# Patient Record
Sex: Female | Born: 1935 | Race: White | Hispanic: No | Smoking: Former smoker
Health system: Southern US, Community
[De-identification: ages and names within clinical notes are randomized; demographics above are authoritative.]

## PROBLEM LIST (undated history)

## (undated) DIAGNOSIS — I4891 Unspecified atrial fibrillation: Secondary | ICD-10-CM

## (undated) DIAGNOSIS — I10 Essential (primary) hypertension: Secondary | ICD-10-CM

## (undated) DIAGNOSIS — E039 Hypothyroidism, unspecified: Secondary | ICD-10-CM

## (undated) DIAGNOSIS — I340 Nonrheumatic mitral (valve) insufficiency: Secondary | ICD-10-CM

## (undated) DIAGNOSIS — E785 Hyperlipidemia, unspecified: Secondary | ICD-10-CM

## (undated) DIAGNOSIS — I341 Nonrheumatic mitral (valve) prolapse: Secondary | ICD-10-CM

## (undated) HISTORY — DX: Unspecified atrial fibrillation: I48.91

## (undated) HISTORY — DX: Essential (primary) hypertension: I10

## (undated) HISTORY — PX: OTHER SURGICAL HISTORY: SHX169

## (undated) HISTORY — PX: APPENDECTOMY: SHX54

## (undated) HISTORY — DX: Nonrheumatic mitral (valve) prolapse: I34.1

## (undated) HISTORY — PX: CHOLECYSTECTOMY: SHX55

## (undated) HISTORY — DX: Nonrheumatic mitral (valve) insufficiency: I34.0

## (undated) HISTORY — DX: Hyperlipidemia, unspecified: E78.5

---

## 2017-07-09 LAB — HEMOGLOBIN A1C: Hemoglobin A1C: 5.1

## 2018-04-09 LAB — BASIC METABOLIC PANEL
BUN: 10 (ref 4–21)
GLUCOSE: 89
Potassium: 4.2 (ref 3.4–5.3)
Sodium: 136 — AB (ref 137–147)

## 2018-04-09 LAB — CBC AND DIFFERENTIAL
HCT: 40 (ref 36–46)
Hemoglobin: 13.2 (ref 12.0–16.0)
Platelets: 130 — AB (ref 150–399)

## 2018-04-09 LAB — TSH: TSH: 2.79 (ref <=5.90)

## 2018-04-21 ENCOUNTER — Encounter: Payer: Self-pay | Admitting: Family Medicine

## 2018-05-13 ENCOUNTER — Ambulatory Visit (INDEPENDENT_AMBULATORY_CARE_PROVIDER_SITE_OTHER): Payer: Medicare Other

## 2018-05-13 ENCOUNTER — Encounter (HOSPITAL_COMMUNITY): Payer: Self-pay | Admitting: Emergency Medicine

## 2018-05-13 ENCOUNTER — Encounter: Payer: Self-pay | Admitting: Family Medicine

## 2018-05-13 ENCOUNTER — Ambulatory Visit (HOSPITAL_COMMUNITY)
Admission: EM | Admit: 2018-05-13 | Discharge: 2018-05-13 | Disposition: A | Discharge status: Home / Self Care | Payer: Medicare Other | Attending: Family Medicine | Admitting: Family Medicine

## 2018-05-13 DIAGNOSIS — W19XXXA Unspecified fall, initial encounter: Secondary | ICD-10-CM | POA: Diagnosis not present

## 2018-05-13 DIAGNOSIS — S8001XA Contusion of right knee, initial encounter: Secondary | ICD-10-CM

## 2018-05-13 MED ORDER — ACETAMINOPHEN 500 MG PO TABS: 1000.0000 mg | ORAL_TABLET | Freq: Three times a day (TID) | ORAL | 0 refills | Status: DC | PRN

## 2018-05-13 NOTE — Discharge Instructions (Addendum)
Your xray is normal today without any sign of broken bone. You clearly have bruised your knee from this fall.  Ice application, ace wrap for compression can help with pain and swelling.  Tylenol as needed Activity as tolerated. If any worsening or no improvement in the next 2 weeks please follow up with orthopedics.   Dg Knee Complete 4 Views Right  Result Date: 05/13/2018 CLINICAL DATA:  Tripped over a metal bar and fell striking the right knee anteriorly. The patient is experiencing anterior knee pain extending medially and laterally. EXAM: RIGHT KNEE - COMPLETE 4+ VIEW COMPARISON:  None in PACs FINDINGS: The bones are subjectively osteopenic. There is no acute fracture nor dislocation. There is no joint effusion. The prepatellar soft tissues appear normal. There is mild narrowing of the medial joint compartment with minimal chondrocalcinosis noted in the medial meniscus. There is also lateral meniscal chondrocalcinosis. There is mild beaking of the tibial spines. IMPRESSION: No acute fracture or dislocation of the right knee. Mild osteoarthritic changes centered on the medial compartment. Electronically Signed   By: David  SwazilandJordan M.D.   On: 05/13/2018 12:21

## 2018-05-13 NOTE — ED Notes (Signed)
traci bast, np handed ace wrap to patient.  Patient reports already having papers

## 2018-05-13 NOTE — ED Triage Notes (Signed)
Pt states she tripped over a metal bar and fell and landed on her R knee. Denies hitting head or LOC. Large bruising noted to R knee, pt is on blood thinners.

## 2018-05-13 NOTE — ED Provider Notes (Signed)
[facility]    CSN: [id number] Arrival date & time: 05/13/18  1058     History   Chief Complaint Chief Complaint  Patient presents with  . Knee Injury  . Fall    HPI Grace Alvarado is a 84 y.o. female.   Grace Alvarado presents with family with complaints of right knee pain after she fell today prior to arrival. [facility] and there was a pole near the carts which she struck causing her to fall on her knee. Pain to right knee. No previous injury or surgery to the knee. No new numbness or tingling. She has been ambulatory but pain with weight bearing. Pain is moderate in severity. Did not hit head, did not lose consciousness. Denies ankle pain, left knee pain, or wrist pain. She is on xarelto for afib.     ROS per HPI.      Past Medical History:  Diagnosis Date  . A-fib (HCC)     There are no active problems to display for this patient.   Past Surgical History:  Procedure Laterality Date  . APPENDECTOMY    . c section    . CHOLECYSTECTOMY      OB History   None      Home Medications    Prior to Admission medications   Medication Sig Start Date End Date Taking? Authorizing Provider  Cyanocobalamin (B-12 COMPLIANCE INJECTION IJ) Inject as directed.   Yes [provider]  diltiazem (DILACOR XR) 120 MG 24 hr capsule Take 120 mg by mouth daily.   Yes [provider]  levothyroxine (SYNTHROID, LEVOTHROID) 75 MCG tablet Take 75 mcg by mouth daily before breakfast.   Yes [provider]  pravastatin (PRAVACHOL) 10 MG tablet Take 10 mg by mouth daily.   Yes [provider]  rivaroxaban (XARELTO) 20 MG TABS tablet Take 20 mg by mouth daily with supper.   Yes [provider]  valsartan (DIOVAN) 40 MG tablet Take 40 mg by mouth daily.   Yes [provider]  acetaminophen (TYLENOL) 500 MG tablet Take 2 tablets (1,000 mg total) by mouth every 8 (eight) hours as needed for moderate pain. 05/13/18   Georgetta HaberBurky,  Skye Rodarte B, NP    Family History No family history on file.  Social History Social History   Tobacco Use  . Smoking status: Former Smoker  Substance Use Topics  . Alcohol use: Not Currently  . Drug use: Never     Allergies   Codeine; Latex; Penicillins; Vancomycin; and Zithromax [azithromycin]   Review of Systems Review of Systems   Physical Exam Triage Vital Signs ED Triage Vitals [05/13/18 1120]  Enc Vitals Group     BP (!) 141/80     Pulse Rate (!) 107     Resp 18     Temp 98.9 F (37.2 C)     Temp src      SpO2 97 %     Weight      Height      Head Circumference      Peak Flow      Pain Score      Pain Loc      Pain Edu?      Excl. in GC?    No data found.  Updated Vital Signs BP (!) 141/80   Pulse (!) 107   Temp 98.9 F (37.2 C)   Resp 18   SpO2 97%    Physical Exam  Constitutional: She  is oriented to person, place, and time. She appears well-developed and well-nourished. No distress.  Cardiovascular: Normal rate and normal heart sounds. An irregularly irregular rhythm present.  Pulmonary/Chest: Effort normal and breath sounds normal.  Musculoskeletal:       Right knee: She exhibits swelling, ecchymosis, erythema and bony tenderness. She exhibits normal range of motion, no effusion, no deformity, no laceration, normal alignment, no LCL laxity, normal patellar mobility, normal meniscus and no MCL laxity. Tenderness found. Lateral joint line tenderness noted.       Right ankle: Normal.  Left knee with old bruising, non tender; right with bruising and likely hematoma present with tenderness to proximal tibial tuberosity and to patella as well as to lateral knee soft tissue; no pain with ROM, no laxity noted; ambulatory without difficulty   Neurological: She is alert and oriented to person, place, and time.  Skin: Skin is warm and dry.     UC Treatments / Results  Labs (all labs ordered are listed, but only abnormal results are displayed) Labs  Reviewed - No data to display  EKG None  Radiology Dg Knee Complete 4 Views Right  Result Date: 05/13/2018 CLINICAL DATA:  Tripped over a metal bar and fell striking the right knee anteriorly. The patient is experiencing anterior knee pain extending medially and laterally. EXAM: RIGHT KNEE - COMPLETE 4+ VIEW COMPARISON:  None in PACs FINDINGS: The bones are subjectively osteopenic. There is no acute fracture nor dislocation. There is no joint effusion. The prepatellar soft tissues appear normal. There is mild narrowing of the medial joint compartment with minimal chondrocalcinosis noted in the medial meniscus. There is also lateral meniscal chondrocalcinosis. There is mild beaking of the tibial spines. IMPRESSION: No acute fracture or dislocation of the right knee. Mild osteoarthritic changes centered on the medial compartment. Electronically Signed   By: David  [place] M.D.   On: 05/13/2018 12:21    Procedures Procedures (including critical care time)  Medications Ordered in UC Medications - No data to display  Initial Impression / Assessment and Plan / UC Course  I have reviewed the triage vital signs and the nursing notes.  Pertinent labs & imaging results that were available during my care of the patient were reviewed by me and considered in my medical decision making (see chart for details).     Xray without acute findings, consistent with exam. Contusion present, is on blood thinners and had a fall. Ice, elevation, tylenol, ace wrap for pain control. Follow up with orthopedics as needed for any persistent symptoms. Patient and families verbalized understanding and agreeable to plan.   Final Clinical Impressions(s) / UC Diagnoses   Final diagnoses:  Fall, initial encounter  Contusion of right knee, initial encounter     Discharge Instructions     Your xray is normal today without any sign of broken bone. You clearly have bruised your knee from this fall.  Ice application, ace  wrap for compression can help with pain and swelling.  Tylenol as needed Activity as tolerated. If any worsening or no improvement in the next 2 weeks please follow up with orthopedics.   Dg Knee Complete 4 Views Right  Result Date: 05/13/2018 CLINICAL DATA:  Tripped over a metal bar and fell striking the right knee anteriorly. The patient is experiencing anterior knee pain extending medially and laterally. EXAM: RIGHT KNEE - COMPLETE 4+ VIEW COMPARISON:  None in PACs FINDINGS: The bones are subjectively osteopenic. There is no acute fracture nor dislocation. There  is no joint effusion. The prepatellar soft tissues appear normal. There is mild narrowing of the medial joint compartment with minimal chondrocalcinosis noted in the medial meniscus. There is also lateral meniscal chondrocalcinosis. There is mild beaking of the tibial spines. IMPRESSION: No acute fracture or dislocation of the right knee. Mild osteoarthritic changes centered on the medial compartment. Electronically Signed   By: David  [place] M.D.   On: 05/13/2018 12:21     ED Prescriptions    Medication Sig Dispense Auth. Provider   acetaminophen (TYLENOL) 500 MG tablet Take 2 tablets (1,000 mg total) by mouth every 8 (eight) hours as needed for moderate pain. 30 tablet Georgetta Haber, NP     Controlled Substance Prescriptions [place] Controlled Substance Registry consulted? Not Applicable   Georgetta Haber, NP 05/13/18 1229

## 2018-05-14 NOTE — Telephone Encounter (Signed)
Spoke to pt, told her I was calling in response to her My Chart message about her knee pain and burning. Told pt I saw she was seen at urgent care and was given instructions and has an appt with Dr. Artis FlockWolfe on 8/12.  Pt said yes I want you to speak to my daughter it is complicated. Asked pt are you giving me permission to speak to your daughter Angelique BlonderDenise. Pt said yes. Spoke to pt's daughter Angelique BlonderDenise. She explained pt was seen at urgent care and is having burning in right knee. Told pt according to note from urgent care right knee is bruised. Asked her if she has a brush burn on knee? Angelique BlonderDenise said yes and she applied ointment to it.  Told her that may be why she has burning due to brush burn. Asked her if she is icing her right knee if so not putting ice directly on knee. Angelique BlonderDenise said she is icing but has it wrapped. Told her okay. Asked her if she is having increase in pain or swelling. Angelique BlonderDenise said no swelling is better. Went over instructions again from Urgent care and told her we could see her today if she would like with another provider due to Dr. Artis FlockWolfe is out of the office. Angelique BlonderDenise said no, declined to be seen today. Pt will rest, continue ice and tylenol on right knee and wait till appointment on Monday. Told her okay if she has any increase in pain or swelling please go to ED or Urgent care otherwise we will see her on Monday. Densie verbalized understanding.

## 2018-05-17 ENCOUNTER — Encounter: Payer: Self-pay | Admitting: Family Medicine

## 2018-05-17 ENCOUNTER — Ambulatory Visit: Payer: Medicare Other | Admitting: Family Medicine

## 2018-05-17 VITALS — BP 150/94 | HR 95 | Temp 98.0°F | Ht 63.0 in | Wt 149.4 lb

## 2018-05-17 DIAGNOSIS — E038 Other specified hypothyroidism: Secondary | ICD-10-CM | POA: Diagnosis not present

## 2018-05-17 DIAGNOSIS — L858 Other specified epidermal thickening: Secondary | ICD-10-CM

## 2018-05-17 DIAGNOSIS — S8011XA Contusion of right lower leg, initial encounter: Secondary | ICD-10-CM

## 2018-05-17 DIAGNOSIS — I482 Chronic atrial fibrillation, unspecified: Secondary | ICD-10-CM

## 2018-05-17 DIAGNOSIS — I2510 Atherosclerotic heart disease of native coronary artery without angina pectoris: Secondary | ICD-10-CM | POA: Insufficient documentation

## 2018-05-17 DIAGNOSIS — E782 Mixed hyperlipidemia: Secondary | ICD-10-CM

## 2018-05-17 DIAGNOSIS — I10 Essential (primary) hypertension: Secondary | ICD-10-CM

## 2018-05-17 DIAGNOSIS — I340 Nonrheumatic mitral (valve) insufficiency: Secondary | ICD-10-CM | POA: Insufficient documentation

## 2018-05-17 DIAGNOSIS — E039 Hypothyroidism, unspecified: Secondary | ICD-10-CM | POA: Insufficient documentation

## 2018-05-17 DIAGNOSIS — I6523 Occlusion and stenosis of bilateral carotid arteries: Secondary | ICD-10-CM | POA: Diagnosis not present

## 2018-05-17 DIAGNOSIS — I071 Rheumatic tricuspid insufficiency: Secondary | ICD-10-CM | POA: Insufficient documentation

## 2018-05-17 MED ORDER — PRAVASTATIN SODIUM 10 MG PO TABS: 10.0000 mg | ORAL_TABLET | Freq: Every day | ORAL | 3 refills | Status: DC

## 2018-05-17 MED ORDER — DILTIAZEM HCL ER 120 MG PO CP12: 120.0000 mg | ORAL_CAPSULE | Freq: Two times a day (BID) | ORAL | 1 refills | Status: DC

## 2018-05-17 MED ORDER — DICLOFENAC SODIUM 1 % TD GEL: 4.0000 g | Freq: Four times a day (QID) | TRANSDERMAL | 0 refills | Status: DC

## 2018-05-17 MED ORDER — LEVOTHYROXINE SODIUM 75 MCG PO TABS: 75.0000 ug | ORAL_TABLET | Freq: Every day | ORAL | 3 refills | Status: DC

## 2018-05-17 NOTE — Progress Notes (Signed)
Patient: Grace Alvarado MRN: [id number] DOB: December 13, 1924 PCP: System, Pcp Not In     Subjective:  Chief Complaint  Patient presents with  . Establish Care  . Atrial Fibrillation  . Hypertension  . Hyperlipidemia    HPI: The patient is a 84 y.o. female who presents today for HTN, atrial fibrillation, check right leg s/p fall on 8/8.   Afib: currently on xarelto and diltiazem. No other rhythmic drug. Just moved from [place] and is needing a new cardiologist. Briefly reviewed cardiology records and she has failed amio and multaq. Has been cardioverted multiple times. Met with electrophysiologist in June 2019 and discussed ablation, but patient does not want to do this and just do rate control.   HTN: currently on diovan prn and diltiazem. Cbc,cmp and urine just recently done 04/2018 and all to goal. No protein in urine, GFR excellent and cbc essentially normal except for platelets of 130. She has rarely, if ever had to use diovan. It's apparently written with parameters as a PRN drug.   Hyperlipidemia: on pravachol. Labs recently checked and her cholesterol is extremely well controlled and to goal. Total cholesterol: 133, TG: 43, LDL: 56 and HDL: 68. Records indicate CAD and hx of mild carotid stenosis. Last carotid ultrasound I could find was from 2012.   Hypothyroid: currently on synthroid 30mg daily. Just had labs done in July 2019 and TSH/t4 were to goal. Labs scanned into chart.   Right leg pain: She fell onto her right knee on Thursday. Seen in ED with negative xrays. She is on xarelto and has a lot of bruising/hematoma. Pain is a 5-10/10. She is able to walk, but states it burns and is painful. Has gotten slightly better.   Review of Systems  Constitutional: Negative for appetite change and fatigue.  Respiratory: Negative for chest tightness and shortness of breath.   Cardiovascular: Negative for chest pain, palpitations and leg swelling.  Gastrointestinal: Negative for abdominal  pain and nausea.  Musculoskeletal: Positive for back pain and neck pain.  Skin: Positive for color change.       S/p fall on 8/8 lg hematoma on right lower leg  Neurological: Negative for dizziness and headaches.  Psychiatric/Behavioral: The patient is nervous/anxious.     Allergies Patient is allergic to codeine; latex; penicillins; vancomycin; and zithromax [azithromycin].  Past Medical History Patient  has a past medical history of A-fib ([facility], [place], Hypertension, and Thyroid disease.  Surgical History Patient  has a past surgical history that includes Appendectomy; Cholecystectomy; c section; and Cesarean section.  Family History Pateint's Family history is unknown by patient.  Social History Patient  reports that she has quit smoking. She has never used smokeless tobacco. She reports that she drank alcohol. She reports that she does not use drugs.    Objective: Vitals:   05/17/18 1053  BP: (!) 150/94  Pulse: 95  Temp: 98 F (36.7 C)  TempSrc: Oral  SpO2: 98%  Weight: 149 lb 6.4 oz (67.8 kg)  Height: '5\' 3"'  (1.6 m)    Body mass index is 26.47 kg/m.  Physical Exam  Constitutional: She appears well-developed and well-nourished.  HENT:  Right Ear: External ear normal.  Left Ear: External ear normal.  Mouth/Throat: Oropharynx is clear and moist.  Eyes: Pupils are equal, round, and reactive to light. EOM are normal.  Neck: Normal range of motion. Neck supple. No thyromegaly present.  Cardiovascular:  Murmur heard. Rate controlled afib   Pulmonary/Chest: Effort normal and breath  sounds normal.  Abdominal: Soft. Bowel sounds are normal.  Skin:  Large hematoma on right patella. TTP. ecchymosis all down right anterior leg.   Small, crusty, pointed growth coming off left finger, middle MCP.   Psychiatric: She has a normal mood and affect. Her behavior is normal.  Vitals reviewed.      Assessment/plan: 1. Essential hypertension Above goal  today. Family states that she is usually well controlled around 120/80. She is in quite a bit of pain so likely not helping. We are going to continue medication. Discussed the recall on valsartan, but it sounds like she has never needed to take this drug since written with prn parameters. Not sure why this drug was chosen for this and I would not use it. They will be seeing cardiology so advised they talk to him regarding using this drug prn and continuing this since recalled. Spent over 25 minutes reviewing records with family.   2. Chronic a-fib (HCC) No desire for ablation, just would like to be rate controlled. Continue current meds and referral placed to preferred cardiologist. Refills given.  - Ambulatory referral to Cardiology  3. Other specified hypothyroidism To goal. Continue current dosing. Labs just done. Refills given.   4. Carotid atherosclerosis, bilateral Last ultrasound done was in 2012. Would repeat and will ask cardiologist when they go. Already on statin.   5. Keratoacanthoma Needs removed to make sure no scc. Referral to derm done.  - Ambulatory referral to Dermatology  6. Leg hematoma, right, initial encounter Discussed this will take some time to reabsorp. Is already feeling a little better. Can continue ice, leg elevation. Cellulitis precautions given. If not getting better after 2+ weeks they are to let me know. She may need Mri to rule out meniscus injury or ligament injury.     7. Mixed hyperlipidemia On statin. Cholesterol to goal. Refills given.   Records requested and will review rest of her cards records. Can return for AWV and see me in 6 months.    Return in about 6 months (around 11/17/2018).     Orma Flaming, MD [place]   05/17/2018

## 2018-05-19 ENCOUNTER — Telehealth: Payer: Self-pay | Admitting: Family Medicine

## 2018-05-19 ENCOUNTER — Encounter: Payer: Self-pay | Admitting: Family Medicine

## 2018-05-19 NOTE — Telephone Encounter (Signed)
appt scheduled

## 2018-05-19 NOTE — Telephone Encounter (Signed)
Copied from CRM [id number]. Topic: Appointment Scheduling - Scheduling Inquiry for Clinic >> May 19, 2018 12:07 PM Stephannie LiSimmons, Deem Marmol L, NT wrote: Reason for CRM: Patients daughter called she ia patient of Dr Katrinka BlazingSmith and would like for him to see the patient , she fell last week and she is new to the area and would a call back to schedule to schedule at [phone]

## 2018-05-20 ENCOUNTER — Ambulatory Visit: Payer: Medicare Other | Admitting: Family Medicine

## 2018-05-20 ENCOUNTER — Encounter: Payer: Self-pay | Admitting: Family Medicine

## 2018-05-20 VITALS — BP 132/80 | HR 90 | Temp 98.0°F | Ht 63.0 in | Wt 150.2 lb

## 2018-05-20 DIAGNOSIS — K219 Gastro-esophageal reflux disease without esophagitis: Secondary | ICD-10-CM | POA: Insufficient documentation

## 2018-05-20 DIAGNOSIS — L03115 Cellulitis of right lower limb: Secondary | ICD-10-CM

## 2018-05-20 DIAGNOSIS — E785 Hyperlipidemia, unspecified: Secondary | ICD-10-CM | POA: Insufficient documentation

## 2018-05-20 DIAGNOSIS — I509 Heart failure, unspecified: Secondary | ICD-10-CM | POA: Insufficient documentation

## 2018-05-20 DIAGNOSIS — I5032 Chronic diastolic (congestive) heart failure: Secondary | ICD-10-CM | POA: Insufficient documentation

## 2018-05-20 DIAGNOSIS — I270 Primary pulmonary hypertension: Secondary | ICD-10-CM | POA: Insufficient documentation

## 2018-05-20 MED ORDER — CLINDAMYCIN HCL 300 MG PO CAPS: 300.0000 mg | ORAL_CAPSULE | Freq: Three times a day (TID) | ORAL | 0 refills | Status: DC

## 2018-05-20 NOTE — Progress Notes (Signed)
Patient: Grace Alvarado MRN: [id number] DOB: 12-Nov-1924 PCP: Orland MustardWolfe, Melvin Whiteford, MD     Subjective:  Chief Complaint  Patient presents with  . knee with worse swelling and pain    HPI: The patient is a 84 y.o. female who presents today for increased redness and pain in right knee/leg. She states it is the pain that has gotten worse. The actual swelling has gone down. The redness is new. No fever/chills. She states walking is getting harder because of the pain. She is using the walker to make sure she didn't fall. She has the most pain in the chair or in the bed. She doesn't feel like her knee is unstable. She states its very tender to touch.   Review of Systems  Constitutional: Negative for chills and fever.  Respiratory: Negative for shortness of breath.   Cardiovascular: Negative for chest pain.  Musculoskeletal: Positive for arthralgias.       Right knee pain/swelling  Neurological: Negative for dizziness and headaches.  Hematological: Bruises/bleeds easily.  Psychiatric/Behavioral: The patient is nervous/anxious.     Allergies Patient is allergic to codeine; latex; penicillins; vancomycin; and zithromax [azithromycin].  Past Medical History Patient  has a past medical history of A-fib [facility](HCC), Atrial fibrillation (HCC), Hypertension, and Thyroid disease.  Surgical History Patient  has a past surgical history that includes Appendectomy; Cholecystectomy; c section; and Cesarean section.  Family History Pateint's Family history is unknown by patient.  Social History Patient  reports that she has quit smoking. She has never used smokeless tobacco. She reports that she drank alcohol. She reports that she does not use drugs.    Objective: Vitals:   05/20/18 1406  BP: 132/80  Pulse: 90  Temp: 98 F (36.7 C)  TempSrc: Oral  SpO2: 99%  Weight: 150 lb 3.2 oz (68.1 kg)  Height: 5\' 3"  (1.6 m)    Body mass index is 26.61 kg/m.  Physical Exam  Constitutional: She appears  well-developed and well-nourished.  Musculoskeletal:  Right knee: edema/hematoma over patella much improved. Significantly tender to touch over patella and surrounding areas. Erythema with pitting and warm to touch. ecchymosis over anterior and posterior lower leg. No TTP or swelling in posterior calf. Negative homan. Bruising improved from previous visit.   Nursing note and vitals reviewed.      Assessment/plan: 1. Cellulitis of leg, right Cover her with clindamycin since allergy to pcn and unaware of allergy. Family is not comfortable doing cephalosporin. Edema/hematoma has actually improved, but cellulitis and tenderness new. Discussed tylenol dosing again and I think she should try the voltaren gel once daily. Very strict ER precautions given and if fever, worsening pain or worsening erythema she is to go to ER.      Return if symptoms worsen or fail to improve.     Orland MustardAllison Watt Geiler, MD [facility] [facility]   05/20/2018

## 2018-05-21 ENCOUNTER — Encounter (HOSPITAL_COMMUNITY): Payer: Self-pay | Admitting: Emergency Medicine

## 2018-05-21 ENCOUNTER — Inpatient Hospital Stay (HOSPITAL_COMMUNITY)
Admission: EM | Admit: 2018-05-21 | Discharge: 2018-05-24 | DRG: 605 | Disposition: A | Discharge status: Home / Self Care | Payer: Medicare Other | Attending: Internal Medicine | Admitting: Internal Medicine

## 2018-05-21 ENCOUNTER — Encounter: Payer: Self-pay | Admitting: Family Medicine

## 2018-05-21 DIAGNOSIS — E871 Hypo-osmolality and hyponatremia: Secondary | ICD-10-CM | POA: Diagnosis not present

## 2018-05-21 DIAGNOSIS — Z79890 Hormone replacement therapy (postmenopausal): Secondary | ICD-10-CM

## 2018-05-21 DIAGNOSIS — I270 Primary pulmonary hypertension: Secondary | ICD-10-CM | POA: Diagnosis present

## 2018-05-21 DIAGNOSIS — K219 Gastro-esophageal reflux disease without esophagitis: Secondary | ICD-10-CM | POA: Diagnosis present

## 2018-05-21 DIAGNOSIS — E785 Hyperlipidemia, unspecified: Secondary | ICD-10-CM | POA: Diagnosis present

## 2018-05-21 DIAGNOSIS — M7041 Prepatellar bursitis, right knee: Secondary | ICD-10-CM | POA: Diagnosis present

## 2018-05-21 DIAGNOSIS — L03115 Cellulitis of right lower limb: Secondary | ICD-10-CM

## 2018-05-21 DIAGNOSIS — I2510 Atherosclerotic heart disease of native coronary artery without angina pectoris: Secondary | ICD-10-CM | POA: Diagnosis present

## 2018-05-21 DIAGNOSIS — I5032 Chronic diastolic (congestive) heart failure: Secondary | ICD-10-CM

## 2018-05-21 DIAGNOSIS — I482 Chronic atrial fibrillation, unspecified: Secondary | ICD-10-CM | POA: Diagnosis present

## 2018-05-21 DIAGNOSIS — Z809 Family history of malignant neoplasm, unspecified: Secondary | ICD-10-CM

## 2018-05-21 DIAGNOSIS — W19XXXA Unspecified fall, initial encounter: Secondary | ICD-10-CM | POA: Diagnosis present

## 2018-05-21 DIAGNOSIS — Z87891 Personal history of nicotine dependence: Secondary | ICD-10-CM

## 2018-05-21 DIAGNOSIS — Z881 Allergy status to other antibiotic agents status: Secondary | ICD-10-CM

## 2018-05-21 DIAGNOSIS — I509 Heart failure, unspecified: Secondary | ICD-10-CM

## 2018-05-21 DIAGNOSIS — S8011XA Contusion of right lower leg, initial encounter: Secondary | ICD-10-CM | POA: Diagnosis not present

## 2018-05-21 DIAGNOSIS — W010XXA Fall on same level from slipping, tripping and stumbling without subsequent striking against object, initial encounter: Secondary | ICD-10-CM

## 2018-05-21 DIAGNOSIS — E039 Hypothyroidism, unspecified: Secondary | ICD-10-CM | POA: Diagnosis present

## 2018-05-21 DIAGNOSIS — Z91040 Latex allergy status: Secondary | ICD-10-CM

## 2018-05-21 DIAGNOSIS — Z885 Allergy status to narcotic agent status: Secondary | ICD-10-CM

## 2018-05-21 DIAGNOSIS — L0390 Cellulitis, unspecified: Secondary | ICD-10-CM | POA: Diagnosis present

## 2018-05-21 DIAGNOSIS — Z7901 Long term (current) use of anticoagulants: Secondary | ICD-10-CM

## 2018-05-21 DIAGNOSIS — Z66 Do not resuscitate: Secondary | ICD-10-CM | POA: Diagnosis present

## 2018-05-21 HISTORY — DX: Hypothyroidism, unspecified: E03.9

## 2018-05-21 LAB — CBC WITH DIFFERENTIAL/PLATELET
Abs Immature Granulocytes: 0 10*3/uL (ref 0.0–0.1)
Basophils Absolute: 0 10*3/uL (ref 0.0–0.1)
Basophils Relative: 0 %
EOS ABS: 0.1 10*3/uL (ref 0.0–0.7)
EOS PCT: 2 %
HCT: 31.7 % — ABNORMAL LOW (ref 36.0–46.0)
HEMOGLOBIN: 10.3 g/dL — AB (ref 12.0–15.0)
Immature Granulocytes: 0 %
LYMPHS PCT: 23 %
Lymphs Abs: 1.1 10*3/uL (ref 0.7–4.0)
MCH: 32.2 pg (ref 26.0–34.0)
MCHC: 32.5 g/dL (ref 30.0–36.0)
MCV: 99.1 fL (ref 78.0–100.0)
Monocytes Absolute: 0.6 10*3/uL (ref 0.1–1.0)
Monocytes Relative: 12 %
Neutro Abs: 3 10*3/uL (ref 1.7–7.7)
Neutrophils Relative %: 63 %
Platelets: 164 10*3/uL (ref 150–400)
RBC: 3.2 MIL/uL — AB (ref 3.87–5.11)
RDW: 13.6 % (ref 11.5–15.5)
WBC: 4.7 10*3/uL (ref 4.0–10.5)

## 2018-05-21 LAB — BASIC METABOLIC PANEL
Anion gap: 7 (ref 5–15)
BUN: 17 mg/dL (ref 8–23)
CALCIUM: 9.5 mg/dL (ref 8.9–10.3)
CO2: 26 mmol/L (ref 22–32)
CREATININE: 0.64 mg/dL (ref 0.44–1.00)
Chloride: 92 mmol/L — ABNORMAL LOW (ref 98–111)
GFR calc non Af Amer: >60 mL/min (ref >60)
Glucose, Bld: 108 mg/dL — ABNORMAL HIGH (ref 70–99)
Potassium: 3.8 mmol/L (ref 3.5–5.1)
SODIUM: 125 mmol/L — AB (ref 135–145)

## 2018-05-21 LAB — PROTIME-INR
INR: 2.64
PROTHROMBIN TIME: 28 s — AB (ref 11.4–15.2)

## 2018-05-21 NOTE — ED Triage Notes (Addendum)
Patient reports increasing swelling/bruise at right knee extending down the ankle these past several days , she had a fall and landed on her right knee last week , seen at urgent care and was discharged home. She is currently taking Clindamycin oral antibiotic .

## 2018-05-22 ENCOUNTER — Other Ambulatory Visit: Payer: Self-pay

## 2018-05-22 ENCOUNTER — Emergency Department (HOSPITAL_COMMUNITY): Payer: Medicare Other

## 2018-05-22 ENCOUNTER — Encounter (HOSPITAL_COMMUNITY): Payer: Self-pay | Admitting: Internal Medicine

## 2018-05-22 DIAGNOSIS — Z7901 Long term (current) use of anticoagulants: Secondary | ICD-10-CM | POA: Diagnosis not present

## 2018-05-22 DIAGNOSIS — E039 Hypothyroidism, unspecified: Secondary | ICD-10-CM | POA: Diagnosis present

## 2018-05-22 DIAGNOSIS — E871 Hypo-osmolality and hyponatremia: Secondary | ICD-10-CM | POA: Diagnosis not present

## 2018-05-22 DIAGNOSIS — L0390 Cellulitis, unspecified: Secondary | ICD-10-CM | POA: Diagnosis present

## 2018-05-22 DIAGNOSIS — K219 Gastro-esophageal reflux disease without esophagitis: Secondary | ICD-10-CM | POA: Diagnosis present

## 2018-05-22 DIAGNOSIS — E785 Hyperlipidemia, unspecified: Secondary | ICD-10-CM | POA: Diagnosis present

## 2018-05-22 DIAGNOSIS — Z885 Allergy status to narcotic agent status: Secondary | ICD-10-CM | POA: Diagnosis not present

## 2018-05-22 DIAGNOSIS — Z881 Allergy status to other antibiotic agents status: Secondary | ICD-10-CM | POA: Diagnosis not present

## 2018-05-22 DIAGNOSIS — L03115 Cellulitis of right lower limb: Secondary | ICD-10-CM | POA: Diagnosis not present

## 2018-05-22 DIAGNOSIS — I2510 Atherosclerotic heart disease of native coronary artery without angina pectoris: Secondary | ICD-10-CM | POA: Diagnosis present

## 2018-05-22 DIAGNOSIS — Z91040 Latex allergy status: Secondary | ICD-10-CM | POA: Diagnosis not present

## 2018-05-22 DIAGNOSIS — I270 Primary pulmonary hypertension: Secondary | ICD-10-CM | POA: Diagnosis present

## 2018-05-22 DIAGNOSIS — Z66 Do not resuscitate: Secondary | ICD-10-CM | POA: Diagnosis present

## 2018-05-22 DIAGNOSIS — Z809 Family history of malignant neoplasm, unspecified: Secondary | ICD-10-CM | POA: Diagnosis not present

## 2018-05-22 DIAGNOSIS — I482 Chronic atrial fibrillation: Secondary | ICD-10-CM | POA: Diagnosis not present

## 2018-05-22 DIAGNOSIS — S8011XA Contusion of right lower leg, initial encounter: Secondary | ICD-10-CM | POA: Diagnosis present

## 2018-05-22 DIAGNOSIS — M7041 Prepatellar bursitis, right knee: Secondary | ICD-10-CM | POA: Diagnosis present

## 2018-05-22 DIAGNOSIS — W19XXXA Unspecified fall, initial encounter: Secondary | ICD-10-CM | POA: Diagnosis present

## 2018-05-22 DIAGNOSIS — Z79890 Hormone replacement therapy (postmenopausal): Secondary | ICD-10-CM | POA: Diagnosis not present

## 2018-05-22 DIAGNOSIS — E782 Mixed hyperlipidemia: Secondary | ICD-10-CM | POA: Diagnosis not present

## 2018-05-22 DIAGNOSIS — Z87891 Personal history of nicotine dependence: Secondary | ICD-10-CM | POA: Diagnosis not present

## 2018-05-22 LAB — OSMOLALITY, URINE: Osmolality, Ur: 255 mosm/kg — ABNORMAL LOW (ref 300–900)

## 2018-05-22 LAB — CREATININE, URINE, RANDOM: Creatinine, Urine: 28.7 mg/dL

## 2018-05-22 LAB — SODIUM, URINE, RANDOM: Sodium, Ur: 43 mmol/L

## 2018-05-22 MED ORDER — RISAQUAD PO CAPS
1.0000 | ORAL_CAPSULE | Freq: Every day | ORAL | Status: DC
  Administered 2018-05-22 – 2018-05-24 (×3): 1 via ORAL
  Filled 2018-05-22 (×3): qty 1

## 2018-05-22 MED ORDER — RIVAROXABAN 15 MG PO TABS
15.0000 mg | ORAL_TABLET | Freq: Every day | ORAL | Status: DC
  Administered 2018-05-22 – 2018-05-23 (×2): 15 mg via ORAL
  Filled 2018-05-22 (×3): qty 1

## 2018-05-22 MED ORDER — DILTIAZEM HCL ER 60 MG PO CP12
120.0000 mg | ORAL_CAPSULE | Freq: Two times a day (BID) | ORAL | Status: DC
  Administered 2018-05-22 – 2018-05-24 (×4): 120 mg via ORAL
  Filled 2018-05-22 (×7): qty 2

## 2018-05-22 MED ORDER — CLINDAMYCIN PHOSPHATE 600 MG/50ML IV SOLN
600.0000 mg | Freq: Three times a day (TID) | INTRAVENOUS | Status: DC
  Administered 2018-05-22 – 2018-05-24 (×7): 600 mg via INTRAVENOUS
  Filled 2018-05-22 (×8): qty 50

## 2018-05-22 MED ORDER — IRBESARTAN 75 MG PO TABS: 37.5000 mg | ORAL_TABLET | Freq: Every day | ORAL | Status: DC

## 2018-05-22 MED ORDER — PRAVASTATIN SODIUM 10 MG PO TABS
10.0000 mg | ORAL_TABLET | Freq: Every day | ORAL | Status: DC
  Administered 2018-05-22 – 2018-05-23 (×2): 10 mg via ORAL
  Filled 2018-05-22 (×3): qty 1

## 2018-05-22 MED ORDER — CLINDAMYCIN PHOSPHATE 600 MG/50ML IV SOLN
600.0000 mg | Freq: Three times a day (TID) | INTRAVENOUS | Status: DC
  Filled 2018-05-22: qty 50

## 2018-05-22 MED ORDER — SODIUM CHLORIDE 0.9 % IV BOLUS
1000.0000 mL | Freq: Once | INTRAVENOUS | Status: AC
  Administered 2018-05-22: 1000 mL via INTRAVENOUS

## 2018-05-22 MED ORDER — RIVAROXABAN 20 MG PO TABS: 20.0000 mg | ORAL_TABLET | Freq: Every day | ORAL | Status: DC

## 2018-05-22 MED ORDER — DEXTROSE 50 % IV SOLN: 25.0000 mL | Freq: Once | INTRAVENOUS | Status: DC

## 2018-05-22 MED ORDER — ACETAMINOPHEN 325 MG PO TABS
650.0000 mg | ORAL_TABLET | Freq: Four times a day (QID) | ORAL | Status: DC | PRN
  Administered 2018-05-22 – 2018-05-24 (×5): 650 mg via ORAL
  Filled 2018-05-22 (×5): qty 2

## 2018-05-22 MED ORDER — ACETAMINOPHEN 650 MG RE SUPP: 650.0000 mg | Freq: Four times a day (QID) | RECTAL | Status: DC | PRN

## 2018-05-22 MED ORDER — VITAMIN C 500 MG PO TABS
500.0000 mg | ORAL_TABLET | Freq: Every day | ORAL | Status: DC
  Administered 2018-05-22 – 2018-05-24 (×3): 500 mg via ORAL
  Filled 2018-05-22 (×3): qty 1

## 2018-05-22 MED ORDER — TRAMADOL HCL 50 MG PO TABS: 50.0000 mg | ORAL_TABLET | Freq: Four times a day (QID) | ORAL | Status: DC | PRN

## 2018-05-22 MED ORDER — SODIUM CHLORIDE 0.9% FLUSH: 3.0000 mL | INTRAVENOUS | Status: DC | PRN

## 2018-05-22 MED ORDER — ONDANSETRON HCL 4 MG PO TABS: 4.0000 mg | ORAL_TABLET | Freq: Four times a day (QID) | ORAL | Status: DC | PRN

## 2018-05-22 MED ORDER — LEVOTHYROXINE SODIUM 75 MCG PO TABS
75.0000 ug | ORAL_TABLET | Freq: Every day | ORAL | Status: DC
  Administered 2018-05-23 – 2018-05-24 (×2): 75 ug via ORAL
  Filled 2018-05-22 (×2): qty 1

## 2018-05-22 MED ORDER — SODIUM CHLORIDE 0.9% FLUSH
3.0000 mL | Freq: Two times a day (BID) | INTRAVENOUS | Status: DC
  Administered 2018-05-22 – 2018-05-24 (×5): 3 mL via INTRAVENOUS

## 2018-05-22 MED ORDER — CLINDAMYCIN PHOSPHATE 600 MG/50ML IV SOLN
600.0000 mg | Freq: Once | INTRAVENOUS | Status: AC
  Administered 2018-05-22: 600 mg via INTRAVENOUS
  Filled 2018-05-22: qty 50

## 2018-05-22 MED ORDER — VITAMIN D 1000 UNITS PO TABS
2000.0000 [IU] | ORAL_TABLET | Freq: Every day | ORAL | Status: DC
  Administered 2018-05-22 – 2018-05-24 (×3): 2000 [IU] via ORAL
  Filled 2018-05-22 (×3): qty 2

## 2018-05-22 MED ORDER — POLYETHYLENE GLYCOL 3350 17 G PO PACK: 17.0000 g | PACK | Freq: Every day | ORAL | Status: DC | PRN

## 2018-05-22 MED ORDER — ONDANSETRON HCL 4 MG/2ML IJ SOLN: 4.0000 mg | Freq: Four times a day (QID) | INTRAMUSCULAR | Status: DC | PRN

## 2018-05-22 MED ORDER — SODIUM CHLORIDE 0.9 % IV SOLN
250.0000 mL | INTRAVENOUS | Status: DC | PRN
  Administered 2018-05-22: 250 mL via INTRAVENOUS

## 2018-05-22 NOTE — Plan of Care (Signed)

## 2018-05-22 NOTE — ED Notes (Signed)
Breakfast tray ordered with patient

## 2018-05-22 NOTE — ED Notes (Addendum)
Attending paged for patient's inquiries

## 2018-05-22 NOTE — Discharge Instructions (Addendum)
Follow with Primary MD Orland MustardWolfe, Allison, MD in 3 days   Get CBC, TSH, BMP, checked  by Primary MD in 3 days   Activity: As tolerated with Full fall precautions use walker/cane & assistance as needed  Disposition Home    Diet:    Heart Healthy    For Heart failure patients - Check your Weight same time everyday, if you gain over 2 pounds, or you develop in leg swelling, experience more shortness of breath or chest pain, call your Primary MD immediately. Follow Cardiac Low Salt Diet and 1.5 lit/day fluid restriction.  Special Instructions: If you have smoked or chewed Tobacco  in the last 2 yrs please stop smoking, stop any regular Alcohol  and or any Recreational drug use.  On your next visit with your primary care physician please Get Medicines reviewed and adjusted.  Please request your Prim.MD to go over all Hospital Tests and Procedure/Radiological results at the follow up, please get all Hospital records sent to your Prim MD by signing hospital release before you go home.  If you experience worsening of your admission symptoms, develop shortness of breath, life threatening emergency, suicidal or homicidal thoughts you must seek medical attention immediately by calling 911 or calling your MD immediately  if symptoms less severe.  You Must read complete instructions/literature along with all the possible adverse reactions/side effects for all the Medicines you take and that have been prescribed to you. Take any new Medicines after you have completely understood and accpet all the possible adverse reactions/side effects.       Information on my medicine - XARELTO (Rivaroxaban)  Why was Xarelto prescribed for you? Xarelto was prescribed for you to reduce the risk of a blood clot forming that can cause a stroke if you have a medical condition called atrial fibrillation (a type of irregular heartbeat).  What do you need to know about xarelto ? Take your Xarelto ONCE DAILY at the same  time every day with your evening meal. If you have difficulty swallowing the tablet whole, you may crush it and mix in applesauce just prior to taking your dose.  Take Xarelto exactly as prescribed by your doctor and DO NOT stop taking Xarelto without talking to the doctor who prescribed the medication.  Stopping without other stroke prevention medication to take the place of Xarelto may increase your risk of developing a clot that causes a stroke.  Refill your prescription before you run out.  After discharge, you should have regular check-up appointments with your healthcare provider that is prescribing your Xarelto.  In the future your dose may need to be changed if your kidney function or weight changes by a significant amount.  What do you do if you miss a dose? If you are taking Xarelto ONCE DAILY and you miss a dose, take it as soon as you remember on the same day then continue your regularly scheduled once daily regimen the next day. Do not take two doses of Xarelto at the same time or on the same day.   Important Safety Information A possible side effect of Xarelto is bleeding. You should call your healthcare provider right away if you experience any of the following: ? Bleeding from an injury or your nose that does not stop. ? Unusual colored urine (red or dark brown) or unusual colored stools (red or black). ? Unusual bruising for unknown reasons. ? A serious fall or if you hit your head (even if there is no bleeding).  Some medicines may interact with Xarelto and might increase your risk of bleeding while on Xarelto. To help avoid this, consult your healthcare provider or pharmacist prior to using any new prescription or non-prescription medications, including herbals, vitamins, non-steroidal anti-inflammatory drugs (NSAIDs) and supplements.  This website has more information on Xarelto: [email or web address].xarelto.com.

## 2018-05-22 NOTE — H&P (Signed)
History and Physical    Grace Alvarado ZOX:[id number] DOB: 01/15/1925 DOA: 05/21/2018  PCP: Orland MustardWolfe, Allison, MD   Patient coming from: home    Chief Complaint: RLE pain  HPI: Grace Alvarado is a 84 y.o. female with medical history significant of chronic atrial fibrillation on rivaroxaban, hypertension, chronic hyponatremia, hypothyroidism, hyperlipidemia who comes in with worsening right lower extremity pain, redness, swelling.  Patient reports that she had a mechanical fall approximately 1 week ago and injured her right knee.  She developed a hematoma over that area.  Approximately 2 days ago she went to see her PCP due to persistent swelling and worsening redness.  Her PCP diagnosed her with cellulitis and started on clindamycin which she has been compliant with.  She continued to clindamycin however the erythema, pain, edema continued to worsen.  He presented to the ED at the advice of her PCP.  She denies any nausea, vomiting, diarrhea, cough, congestion, rhinorrhea.  She denies any fevers or chills.  She denies any numbness or tingling.  She denies any chest pain or abdominal pain.  ED Course: In the ED patient's vitals were stable.  Labs are unremarkable other than sodium of 125.  The CT was performed which showed prepatellar and infrapatellar soft tissue hematoma versus bursitis as well as diffuse edema of the subcutaneous tissues concerning for cellulitis.  Patient was given IV clindamycin.  Review of Systems: As per HPI otherwise 10 point review of systems negative.    Past Medical History:  Diagnosis Date  . A-fib (HCC)   . Atrial fibrillation (HCC)   . Hypertension   . Hypothyroidism   . Thyroid disease     Past Surgical History:  Procedure Laterality Date  . APPENDECTOMY    . c section    . CESAREAN SECTION    . CHOLECYSTECTOMY       reports that she has quit smoking. She has never used smokeless tobacco. She reports that she drank alcohol. She reports that she does not  use drugs.  Allergies  Allergen Reactions  . Codeine Other (See Comments)    unknown  . Penicillins Other (See Comments)    unknown  . Vancomycin Other (See Comments)    unknown  . Zithromax [Azithromycin] Other (See Comments)    unknown  . Latex Rash    Family History  Problem Relation Age of Onset  . Cancer Mother   . Dementia Mother     Prior to Admission medications   Medication Sig Start Date End Date Taking? Authorizing Provider  acetaminophen (TYLENOL) 500 MG tablet Take 2 tablets (1,000 mg total) by mouth every 8 (eight) hours as needed for moderate pain. Patient taking differently: Take 500 mg by mouth every 8 (eight) hours as needed for moderate pain.  05/13/18  Yes Linus MakoBurky, Natalie B, NP  cholecalciferol (VITAMIN D) 1000 units tablet Take 2,000 Units by mouth daily.   Yes [provider]  clindamycin (CLEOCIN) 300 MG capsule Take 1 capsule (300 mg total) by mouth 3 (three) times daily. 05/20/18  Yes Orland MustardWolfe, Allison, MD  Cyanocobalamin (B-12 COMPLIANCE INJECTION IJ) Inject 1 Applicatorful as directed every 30 (thirty) days.    Yes [provider]  diltiazem (CARDIZEM SR) 120 MG 12 hr capsule Take 1 capsule (120 mg total) by mouth 2 (two) times daily. 05/17/18  Yes Orland MustardWolfe, Allison, MD  levothyroxine (SYNTHROID, LEVOTHROID) 75 MCG tablet Take 1 tablet (75 mcg total) by mouth daily before breakfast. 05/17/18  Yes Orland MustardWolfe, Allison, MD  pravastatin (PRAVACHOL) 10 MG tablet Take 1 tablet (10 mg total) by mouth daily. 05/17/18  Yes Orland MustardWolfe, Allison, MD  Probiotic Product (PROBIOTIC PO) Take 1 tablet by mouth daily.   Yes [provider]  rivaroxaban (XARELTO) 20 MG TABS tablet Take 20 mg by mouth daily with supper.   Yes [provider]  valsartan (DIOVAN) 40 MG tablet Take 40 mg by mouth daily as needed (hypertension).    Yes [provider]  vitamin C (ASCORBIC ACID) 500 MG tablet Take 500 mg by mouth daily.   Yes [provider]    diclofenac sodium (VOLTAREN) 1 % GEL Apply 4 g topically 4 (four) times daily. Patient not taking: Reported on 05/22/2018 05/17/18   Orland MustardWolfe, Allison, MD    Physical Exam: Vitals:   05/22/18 0715 05/22/18 0730 05/22/18 0745 05/22/18 0748  BP: 113/71 (!) 104/59 103/67   Pulse: 92 72 81   Resp:   17   Temp:      TempSrc:      SpO2: 94% 98% 93% 96%  Weight:      Height:        Constitutional: NAD, calm, comfortable Vitals:   05/22/18 0715 05/22/18 0730 05/22/18 0745 05/22/18 0748  BP: 113/71 (!) 104/59 103/67   Pulse: 92 72 81   Resp:   17   Temp:      TempSrc:      SpO2: 94% 98% 93% 96%  Weight:      Height:       Eyes: Anicteric sclera ENMT: Mucous membranes are moist..  Neck: normal, supple, no masses, no thyromegaly Respiratory: clear to auscultation bilaterally, no wheezing, no crackles. Normal respiratory effort. No accessory muscle use.  Cardiovascular: Irregularly irregular, no murmurs Abdomen: no tenderness, no masses palpated. No hepatosplenomegaly. Bowel sounds positive.  Musculoskeletal: Bilateral right greater than left lower extremity swelling him a large hematoma noted over patella, multiple ecchymoses over lower extremity, distal intact pulses Skin: Large area of erythema, swelling, tenderness over right lower extremity below knee Neurologic: Intact, moving all extremities Psychiatric: Normal judgment and insight. Alert and oriented x 3. Normal mood.    Labs on Admission: I have personally reviewed following labs and imaging studies  CBC: Recent Labs  Lab 05/21/18 2255  WBC 4.7  NEUTROABS 3.0  HGB 10.3*  HCT 31.7*  MCV 99.1  PLT 164   Basic Metabolic Panel: Recent Labs  Lab 05/21/18 2255  NA 125*  K 3.8  CL 92*  CO2 26  GLUCOSE 108*  BUN 17  CREATININE 0.64  CALCIUM 9.5   GFR: Estimated Creatinine Clearance: 43.2 mL/min (by C-G formula based on SCr of 0.64 mg/dL). Liver Function Tests: No results for input(s): AST, ALT, ALKPHOS,  BILITOT, PROT, ALBUMIN in the last 168 hours. No results for input(s): LIPASE, AMYLASE in the last 168 hours. No results for input(s): AMMONIA in the last 168 hours. Coagulation Profile: Recent Labs  Lab 05/21/18 2255  INR 2.64   Cardiac Enzymes: No results for input(s): CKTOTAL, CKMB, CKMBINDEX, TROPONINI in the last 168 hours. BNP (last 3 results) No results for input(s): PROBNP in the last 8760 hours. HbA1C: No results for input(s): HGBA1C in the last 72 hours. CBG: No results for input(s): GLUCAP in the last 168 hours. Lipid Profile: No results for input(s): CHOL, HDL, LDLCALC, TRIG, CHOLHDL, LDLDIRECT in the last 72 hours. Thyroid Function Tests: No results for input(s): TSH, T4TOTAL, FREET4, T3FREE, THYROIDAB in the last 72 hours. Anemia Panel:  No results for input(s): VITAMINB12, FOLATE, FERRITIN, TIBC, IRON, RETICCTPCT in the last 72 hours. Urine analysis: No results found for: COLORURINE, APPEARANCEUR, LABSPEC, PHURINE, GLUCOSEU, HGBUR, BILIRUBINUR, KETONESUR, PROTEINUR, UROBILINOGEN, NITRITE, LEUKOCYTESUR  Radiological Exams on Admission: Ct Knee Right Wo Contrast  Result Date: 05/22/2018 CLINICAL DATA:  Knee trauma. Can't walk. EXAM: CT OF THE right KNEE WITHOUT CONTRAST TECHNIQUE: Multidetector CT imaging of the right knee was performed according to the standard protocol. Multiplanar CT image reconstructions were also generated. COMPARISON:  Right knee radiographs 05/13/2018 FINDINGS: Bones/Joint/Cartilage Degenerative changes in the right knee with medial compartment narrowing and small osteophyte formation. No evidence of acute fracture or dislocation. Bone cortex appears intact. No significant effusion. Ligaments Suboptimally assessed by CT. Muscles and Tendons No intramuscular mass or hematoma. Soft tissues Moderate prepatellar and infrapatellar soft tissue collection, mildly hyperdense, measuring 6.4 x 4.7 x 2.4 cm. This may represent bursitis or hematoma. Diffuse  infiltration and fluid throughout the subcutaneous fat consistent with diffuse edema or possibly cellulitis. Mild skin thickening may indicate venous stasis or cellulitis. IMPRESSION: No evidence of acute fracture or dislocation of the right knee. Degenerative changes in the right knee. Moderate prepatellar and infrapatellar soft tissue hematomas versus bursitis. Diffuse edema in the subcutaneous tissues. Electronically Signed   By: Burman Nieves M.D.   On: 05/22/2018 06:34    EKG: Independently reviewed. None preformed  Assessment/Plan Principal Problem:   Cellulitis Active Problems:   Chronic a-fib (HCC)   Hypothyroid   CAD (coronary artery disease)   Pulmonary hypertension, primary (HCC)   Hyperlipidemia   CHF (congestive heart failure) (HCC)    #) Right lower extremity cellulitis: In the setting of trauma.  She does not appear to be systemically ill.  She appears to have failed outpatient clindamycin.  She currently has an allergy to vancomycin.  At this time there is no evidence of a septic joint or even an infected bursa. - Continue IV clindamycin started 05/22/2018 -CK level pending -Blood cultures not obtained  #) Chronic hyponatremia: Patient has reported history of chronic hyponatremia however the family does not know what the level is.  She is relatively hyponatremic today however she is completely is symptomatic.  It is unclear the benefits of treating a 84 year old with chronic a symptom medic hyponatremia. - TSH pending, will hold on a.m. cortisol as she has no other symptoms of renal insufficiency - Urine sodium, creatinine, osmolality, serum osmolality -We will consider starting salt tablets versus different outpatient provider  #) Chronic atrial fibrillation: -Continue rivaroxaban 20 mg nightly -Continue diltiazem 120 mg twice daily  #) Hypertension/hyperlipidemia: -Continue ARB -Continue pravastatin 10 mg daily  #) Hyper thyroidism: -Continue levothyroxine 75  mg daily  Notes: Tolerating p.o. Elect lites: Monitor and supplement Nutrition: Heart healthy diet  Prophylaxis: On rivaroxaban  Disposition: Pending improvement of right lower extremity say let us  DO NOT RESUSCITATE     Delaine Lame MD [facility]  If 7PM-7AM, please contact night-coverage [email or web address] Password [facility]  05/22/2018, 9:14 AM

## 2018-05-22 NOTE — ED Provider Notes (Signed)
[facility] EMERGENCY DEPARTMENT Provider Note   CSN: [id number] Arrival date & time: 05/21/18  2144     History   Chief Complaint Chief Complaint  Patient presents with  . Leg Swelling    HPI Grace Alvarado is a 84 y.o. female with a hx of a-fib (on Xarelto), HTN, thyroid disease presents to the Emergency Department complaining of gradual, persistent, progressively worsening right knee pain onset 8 days ago after mechanical fall. Pt reports significant pain in the knee without ability to ambulate since the fall.  She reports using a walker difficulty.  Additionally, pt reports the knee became red and warm 2 days ago.  She reports she was evaluated by her primary care physician and given Clindamycin.  She reports she has had a total of 6 doses with worsening of redness, pain and erythema.  Pt reports increased edema, ecchymosis the last several days as well.  Pt denies fever, chills, headache, neck pain, chest pain, N/V/D, weakness.  Patient reports limited range of motion due to pain and swelling.  Has been taking Tylenol with only moderate pain relief.   The history is provided by the patient, medical records and a relative. No language interpreter was used.    Past Medical History:  Diagnosis Date  . A-fib (HCC)   . Atrial fibrillation (HCC)   . Hypertension   . Thyroid disease     Patient Active Problem List   Diagnosis Date Noted  . GERD (gastroesophageal reflux disease) 05/20/2018  . Pulmonary hypertension, primary (HCC) 05/20/2018  . Hyperlipidemia 05/20/2018  . CHF (congestive heart failure) (HCC) 05/20/2018  . Tricuspid regurgitation 05/17/2018  . Mitral regurgitation 05/17/2018  . Chronic a-fib (HCC) 05/17/2018  . Essential hypertension 05/17/2018  . Hypothyroid 05/17/2018  . CAD (coronary artery disease) 05/17/2018  . Carotid atherosclerosis, bilateral 05/17/2018    Past Surgical History:  Procedure Laterality Date  . APPENDECTOMY    . c  section    . CESAREAN SECTION    . CHOLECYSTECTOMY       OB History   None      Home Medications    Prior to Admission medications   Medication Sig Start Date End Date Taking? Authorizing Provider  acetaminophen (TYLENOL) 500 MG tablet Take 2 tablets (1,000 mg total) by mouth every 8 (eight) hours as needed for moderate pain. Patient taking differently: Take 500 mg by mouth every 8 (eight) hours as needed for moderate pain.  05/13/18  Yes Linus Mako B, NP  cholecalciferol (VITAMIN D) 1000 units tablet Take 2,000 Units by mouth daily.   Yes [provider]  clindamycin (CLEOCIN) 300 MG capsule Take 1 capsule (300 mg total) by mouth 3 (three) times daily. 05/20/18  Yes [name], MD  Cyanocobalamin (B-12 COMPLIANCE INJECTION IJ) Inject 1 Applicatorful as directed every 30 (thirty) days.    Yes [provider]  diltiazem (CARDIZEM SR) 120 MG 12 hr capsule Take 1 capsule (120 mg total) by mouth 2 (two) times daily. 05/17/18  Yes [name], MD  levothyroxine (SYNTHROID, LEVOTHROID) 75 MCG tablet Take 1 tablet (75 mcg total) by mouth daily before breakfast. 05/17/18  Yes [name], MD  pravastatin (PRAVACHOL) 10 MG tablet Take 1 tablet (10 mg total) by mouth daily. 05/17/18  Yes [name], MD  Probiotic Product (PROBIOTIC PO) Take 1 tablet by mouth daily.   Yes [provider]  rivaroxaban (XARELTO) 20 MG TABS tablet Take 20 mg by mouth daily with supper.  Yes [provider]  valsartan (DIOVAN) 40 MG tablet Take 40 mg by mouth daily as needed (hypertension).    Yes [provider]  vitamin C (ASCORBIC ACID) 500 MG tablet Take 500 mg by mouth daily.   Yes [provider]  diclofenac sodium (VOLTAREN) 1 % GEL Apply 4 g topically 4 (four) times daily. Patient not taking: Reported on 05/22/2018 05/17/18   Orland MustardWolfe, Allison, MD    Family History Family History  Family history unknown: Yes    Social History Social History     Tobacco Use  . Smoking status: Former Games developermoker  . Smokeless tobacco: Never Used  Substance Use Topics  . Alcohol use: Not Currently  . Drug use: Never     Allergies   Codeine; Penicillins; Vancomycin; Zithromax [azithromycin]; and Latex   Review of Systems Review of Systems  Constitutional: Negative for appetite change, diaphoresis, fatigue, fever and unexpected weight change.  HENT: Negative for mouth sores.   Eyes: Negative for visual disturbance.  Respiratory: Negative for cough, chest tightness, shortness of breath and wheezing.   Cardiovascular: Negative for chest pain.  Gastrointestinal: Negative for abdominal pain, constipation, diarrhea, nausea and vomiting.  Endocrine: Negative for polydipsia, polyphagia and polyuria.  Genitourinary: Negative for dysuria, frequency, hematuria and urgency.  Musculoskeletal: Positive for arthralgias and joint swelling. Negative for back pain and neck stiffness.  Skin: Positive for color change. Negative for rash.  Allergic/Immunologic: Negative for immunocompromised state.  Neurological: Negative for syncope, light-headedness and headaches.  Hematological: Does not bruise/bleed easily.  Psychiatric/Behavioral: Negative for sleep disturbance. The patient is not nervous/anxious.      Physical Exam Updated Vital Signs BP (!) 123/48   Pulse 87   Temp 98.6 F (37 C) (Oral)   Resp 16   Ht 5\' 3"  (1.6 m)   Wt 68 kg   SpO2 99%   BMI 26.57 kg/m   Physical Exam  Constitutional: She appears well-developed and well-nourished. No distress.  Awake, alert, nontoxic appearance  HENT:  Head: Normocephalic and atraumatic.  Mouth/Throat: Oropharynx is clear and moist. No oropharyngeal exudate.  Eyes: Conjunctivae are normal. No scleral icterus.  Neck: Normal range of motion. Neck supple.  Cardiovascular: Normal rate, regular rhythm and intact distal pulses.  Capillary refill < 3 sec  Pulmonary/Chest: Effort normal and breath sounds normal.  No respiratory distress. She has no wheezes.  Equal chest expansion  Abdominal: Soft. Bowel sounds are normal. She exhibits no mass. There is no tenderness. There is no rebound and no guarding.  Musculoskeletal: Normal range of motion. She exhibits tenderness. She exhibits no edema.  Right knee with significant ecchymosis and fluid collection in the prepatellar space.  Patellar tendon appears intact.  No palpable joint effusion.  Significant tenderness to palpation along the medial and lateral joint lines.  Full extension but significantly decreased flexion of the knee.  Overlying skin is warm and red.  Significant bruising and generalized edema to the right lower extremity from her knee to her distal forefoot.  Neurological: She is alert. Coordination normal.  Sensation intact to normal touch throughout the right lower extremity Strength/5 with dorsiflexion and plantarflexion.  Strength of the knee not tested due to severe pain  Skin: Skin is warm and dry. She is not diaphoretic.  No tenting of the skin  Psychiatric: She has a normal mood and affect.  Nursing note and vitals reviewed.    ED Treatments / Results  Labs (all labs ordered are listed, but only abnormal  results are displayed) Labs Reviewed  CBC WITH DIFFERENTIAL/PLATELET - Abnormal; Notable for the following components:      Result Value   RBC 3.20 (*)    Hemoglobin 10.3 (*)    HCT 31.7 (*)    All other components within normal limits  BASIC METABOLIC PANEL - Abnormal; Notable for the following components:   Sodium 125 (*)    Chloride 92 (*)    Glucose, Bld 108 (*)    All other components within normal limits  PROTIME-INR - Abnormal; Notable for the following components:   Prothrombin Time 28.0 (*)    All other components within normal limits     Radiology Ct Knee Right Wo Contrast  Result Date: 05/22/2018 CLINICAL DATA:  Knee trauma. Can't walk. EXAM: CT OF THE right KNEE WITHOUT CONTRAST TECHNIQUE: Multidetector  CT imaging of the right knee was performed according to the standard protocol. Multiplanar CT image reconstructions were also generated. COMPARISON:  Right knee radiographs 05/13/2018 FINDINGS: Bones/Joint/Cartilage Degenerative changes in the right knee with medial compartment narrowing and small osteophyte formation. No evidence of acute fracture or dislocation. Bone cortex appears intact. No significant effusion. Ligaments Suboptimally assessed by CT. Muscles and Tendons No intramuscular mass or hematoma. Soft tissues Moderate prepatellar and infrapatellar soft tissue collection, mildly hyperdense, measuring 6.4 x 4.7 x 2.4 cm. This may represent bursitis or hematoma. Diffuse infiltration and fluid throughout the subcutaneous fat consistent with diffuse edema or possibly cellulitis. Mild skin thickening may indicate venous stasis or cellulitis. IMPRESSION: No evidence of acute fracture or dislocation of the right knee. Degenerative changes in the right knee. Moderate prepatellar and infrapatellar soft tissue hematomas versus bursitis. Diffuse edema in the subcutaneous tissues. Electronically Signed   By: Burman NievesWilliam  Stevens M.D.   On: 05/22/2018 06:34    Procedures Procedures (including critical care time)  Medications Ordered in ED Medications  clindamycin (CLEOCIN) IVPB 600 mg (600 mg Intravenous New Bag/Given 05/22/18 0658)  sodium chloride 0.9 % bolus 1,000 mL (0 mLs Intravenous Stopped 05/22/18 0658)     Initial Impression / Assessment and Plan / ED Course  I have reviewed the triage vital signs and the nursing notes.  Pertinent labs & imaging results that were available during my care of the patient were reviewed by me and considered in my medical decision making (see chart for details).  Clinical Course as of May 22 709  Sat May 22, 2018  0505 Family at bedside reports no hx of anemia or hyponatremia   [HM]  0708 Discussed with Dr. Clearnce SorrelPurohit who will admit   [HM]  0709 Noted.  Fluids  given  Sodium(!): 125 [HM]  0709 Noted.  Unknown baseline  Hemoglobin(!): 10.3 [HM]    Clinical Course User Index [HM] Marlaya Turck, Boyd KerbsHannah, PA-C    Presents with worsening pain, swelling and redness of her right knee after fall.  Concerned that a prepatellar hematoma has become infected.  She has had multiple doses of clindamycin with worsening of her symptoms.  Labs are without leukocytosis.  Significant hyponatremia at 125 is noted.  Suspect some element of dehydration.  Fluids given.  Patient is without tachycardia, hypotension or fever.  No evidence of sepsis.  Patient has hemoglobin of 10.3.  Unknown baseline however due to the large size of her hematoma suspect that if she had no previous history of anemia this is likely the source.  CT scan shows no evidence of acute fracture but significant edema fluid collection consistent with bursitis.  Given  this along with clinical picture I believe patient needs to be admitted for IV antibiotics.  She is allergic to penicillins and vancomycin.  We will continue clindamycin IV.     The patient was discussed with and seen by Dr. Blinda Leatherwood who agrees with the treatment plan.   Final Clinical Impressions(s) / ED Diagnoses   Final diagnoses:  Cellulitis of right lower extremity  Fall from slip, trip, or stumble, initial encounter  Prepatellar bursitis of right knee  Hyponatremia    ED Discharge Orders    None       Mardene Sayer Boyd Kerbs 05/22/18 0710    Gilda Crease, MD 05/22/18 [phone]

## 2018-05-23 ENCOUNTER — Encounter: Payer: Self-pay | Admitting: Family Medicine

## 2018-05-23 DIAGNOSIS — I482 Chronic atrial fibrillation: Secondary | ICD-10-CM

## 2018-05-23 DIAGNOSIS — E871 Hypo-osmolality and hyponatremia: Secondary | ICD-10-CM

## 2018-05-23 DIAGNOSIS — E782 Mixed hyperlipidemia: Secondary | ICD-10-CM

## 2018-05-23 LAB — TSH: TSH: 12.737 u[IU]/mL — ABNORMAL HIGH (ref 0.350–4.500)

## 2018-05-23 LAB — CBC
HCT: 28.2 % — ABNORMAL LOW (ref 36.0–46.0)
Hemoglobin: 9.4 g/dL — ABNORMAL LOW (ref 12.0–15.0)
MCH: 32.9 pg (ref 26.0–34.0)
MCHC: 33.3 g/dL (ref 30.0–36.0)
MCV: 98.6 fL (ref 78.0–100.0)
Platelets: 142 10*3/uL — ABNORMAL LOW (ref 150–400)
RBC: 2.86 MIL/uL — ABNORMAL LOW (ref 3.87–5.11)
RDW: 13.8 % (ref 11.5–15.5)
WBC: 4.2 K/uL (ref 4.0–10.5)

## 2018-05-23 LAB — BASIC METABOLIC PANEL WITH GFR
BUN: 11 mg/dL (ref 8–23)
CO2: 26 mmol/L (ref 22–32)
Chloride: 97 mmol/L — ABNORMAL LOW (ref 98–111)
Potassium: 3.9 mmol/L (ref 3.5–5.1)

## 2018-05-23 LAB — BASIC METABOLIC PANEL
Anion gap: 5 (ref 5–15)
Calcium: 9 mg/dL (ref 8.9–10.3)
Creatinine, Ser: 0.62 mg/dL (ref 0.44–1.00)
GFR calc Af Amer: >60 mL/min (ref >60)
GFR calc non Af Amer: >60 mL/min (ref >60)
Glucose, Bld: 99 mg/dL (ref 70–99)
Sodium: 128 mmol/L — ABNORMAL LOW (ref 135–145)

## 2018-05-23 LAB — OSMOLALITY: Osmolality: 269 mOsm/kg — ABNORMAL LOW (ref 275–295)

## 2018-05-23 NOTE — Progress Notes (Signed)
PROGRESS NOTE        PATIENT DETAILS Name: Grace Alvarado Age: 84 y.o. Sex: female Date of Birth: 1925-01-31 Admit Date: 05/21/2018 Admitting Physician Delaine Lame, MD ZOX:[id number], Revonda Standard, MD  Brief Narrative: Patient is a 84 y.o. female with history of chronic atrial fibrillation on Xarelto, hypertension, hypothyroidism, dyslipidemia who sustained a mechanical fall approximately 1 week prior to this hospital stay, she developed some hematoma in her right knee area and her right leg, a few days prior to this hospital stay, she started having worsening erythema and pain.  She was admitted to hospitalist service due to concern for superimposed cellulitis.  See below for further details  Subjective: Continues to have some amount of tenderness-in the right leg and right knee area.  2 daughters at bedside-they showed me a picture of the patient's right lower extremity in the phone-it appears that the current exam has improved somewhat, she previously had some erythema and bruising in the upper thigh as well.   Assessment/Plan: Right lower extremity cellulitis: Although clinically improved-still somewhat tender, continues to have some amount of erythema.  Thankfully she does not have fever or leukocytosis.  Plans are to continue with IV antimicrobial therapy, and reassess tomorrow morning.  She does appear to have mild prepatellar swelling-but probably just a hematoma, if this area does not improve over the next few days with antimicrobial therapy/rest/ice packs, may need to touch base with orthopedics.  Hyponatremia: Asymptomatic-suspect some amount of this may be chronic, sodium has improved to 128.  She is completely euvolemic-I suspect that she may have SIADH or reset thermostat.  Begin fluid restriction and follow.    Hypothyroidism: TSH is mildly elevated-not sure when Synthroid dosing was last adjusted-we will defer to patient's primary care practitioner.  Chronic  atrial fibrillation: Rate controlled with Cardizem, anticoagulated with Xarelto.  Dyslipidemia: Continue statin  DVT Prophylaxis: Full dose anticoagulation with Xarelto  Code Status: DNR  Family Communication: 2 daughters at bedside  Disposition Plan: Remain inpatient-probably home with home health services in the next day or so.  Antimicrobial agents: Anti-infectives (From admission, onward)   Start     Dose/Rate Route Frequency Ordered Stop   05/22/18 1400  clindamycin (CLEOCIN) IVPB 600 mg     600 mg 100 mL/hr over 30 Minutes Intravenous Every 8 hours 05/22/18 1043     05/22/18 0945  clindamycin (CLEOCIN) IVPB 600 mg  Status:  Discontinued     600 mg 100 mL/hr over 30 Minutes Intravenous Every 8 hours 05/22/18 0944 05/22/18 1043   05/22/18 0645  clindamycin (CLEOCIN) IVPB 600 mg     600 mg 100 mL/hr over 30 Minutes Intravenous  Once 05/22/18 5409 05/22/18 0748      Procedures: None  CONSULTS:  None  Time spent: 25- minutes-Greater than 50% of this time was spent in counseling, explanation of diagnosis, planning of further management, and coordination of care.  MEDICATIONS: Scheduled Meds: . acidophilus  1 capsule Oral Daily  . cholecalciferol  2,000 Units Oral Daily  . diltiazem  120 mg Oral BID  . levothyroxine  75 mcg Oral QAC breakfast  . pravastatin  10 mg Oral q1800  . rivaroxaban  15 mg Oral Q supper  . sodium chloride flush  3 mL Intravenous Q12H  . vitamin C  500 mg Oral Daily   Continuous Infusions: . sodium  chloride Stopped (05/23/18 [id number])  . clindamycin (CLEOCIN) IV 100 mL/hr at 05/23/18 0629   PRN Meds:.sodium chloride, acetaminophen **OR** acetaminophen, ondansetron **OR** ondansetron (ZOFRAN) IV, polyethylene glycol, sodium chloride flush, traMADol   PHYSICAL EXAM: Vital signs: Vitals:   05/22/18 0945 05/22/18 1000 05/22/18 1300 05/23/18 0600  BP:   135/63 135/60  Pulse: 81 84 70 75  Resp:   16 16  Temp:   98.5 F (36.9 C) 97.6 F  (36.4 C)  TempSrc:   Oral Oral  SpO2: 99% 96% 90% 99%  Weight:      Height:       Filed Weights   05/21/18 2220  Weight: 68 kg   Body mass index is 26.57 kg/m.   General appearance :Awake, alert, not in any distress. Speech Clear. Eyes:.Pink conjunctiva HEENT: Atraumatic and Normocephalic Neck: supple Resp:Good air entry bilaterally, no added sounds  CVS: S1 S2 irregular GI: Bowel sounds present, Non tender and not distended with no gaurding, rigidity or rebound.No organomegaly Extremities: see pic below Neurology:  speech clear,Non focal, sensation is grossly intact. Psychiatric: Normal judgment and insight. Alert and oriented x 3. Normal mood. Musculoskeletal:No digital cyanosis Skin:No Rash, warm and dry Wounds:N/A    I have personally reviewed following labs and imaging studies  LABORATORY DATA: CBC: Recent Labs  Lab 05/21/18 2255 05/23/18 0255  WBC 4.7 4.2  NEUTROABS 3.0  --   HGB 10.3* 9.4*  HCT 31.7* 28.2*  MCV 99.1 98.6  PLT 164 142*    Basic Metabolic Panel: Recent Labs  Lab 05/21/18 2255 05/23/18 0255  NA 125* 128*  K 3.8 3.9  CL 92* 97*  CO2 26 26  GLUCOSE 108* 99  BUN 17 11  CREATININE 0.64 0.62  CALCIUM 9.5 9.0    GFR: Estimated Creatinine Clearance: 43.2 mL/min (by C-G formula based on SCr of 0.62 mg/dL).  Liver Function Tests: No results for input(s): AST, ALT, ALKPHOS, BILITOT, PROT, ALBUMIN in the last 168 hours. No results for input(s): LIPASE, AMYLASE in the last 168 hours. No results for input(s): AMMONIA in the last 168 hours.  Coagulation Profile: Recent Labs  Lab 05/21/18 2255  INR 2.64    Cardiac Enzymes: No results for input(s): CKTOTAL, CKMB, CKMBINDEX, TROPONINI in the last 168 hours.  BNP (last 3 results) No results for input(s): PROBNP in the last 8760 hours.  HbA1C: No results for input(s): HGBA1C in the last 72 hours.  CBG: No results for input(s): GLUCAP in the last 168 hours.  Lipid Profile: No  results for input(s): CHOL, HDL, LDLCALC, TRIG, CHOLHDL, LDLDIRECT in the last 72 hours.  Thyroid Function Tests: Recent Labs    05/23/18 0255  TSH 12.737*    Anemia Panel: No results for input(s): VITAMINB12, FOLATE, FERRITIN, TIBC, IRON, RETICCTPCT in the last 72 hours.  Urine analysis: No results found for: COLORURINE, APPEARANCEUR, LABSPEC, PHURINE, GLUCOSEU, HGBUR, BILIRUBINUR, KETONESUR, PROTEINUR, UROBILINOGEN, NITRITE, LEUKOCYTESUR  Sepsis Labs: Lactic Acid, Venous No results found for: LATICACIDVEN  MICROBIOLOGY: No results found for this or any previous visit (from the past 240 hour(s)).  RADIOLOGY STUDIES/RESULTS: Ct Knee Right Wo Contrast  Result Date: 05/22/2018 CLINICAL DATA:  Knee trauma. Can't walk. EXAM: CT OF THE right KNEE WITHOUT CONTRAST TECHNIQUE: Multidetector CT imaging of the right knee was performed according to the standard protocol. Multiplanar CT image reconstructions were also generated. COMPARISON:  Right knee radiographs 05/13/2018 FINDINGS: Bones/Joint/Cartilage Degenerative changes in the right knee with medial compartment narrowing and small osteophyte formation.  No evidence of acute fracture or dislocation. Bone cortex appears intact. No significant effusion. Ligaments Suboptimally assessed by CT. Muscles and Tendons No intramuscular mass or hematoma. Soft tissues Moderate prepatellar and infrapatellar soft tissue collection, mildly hyperdense, measuring 6.4 x 4.7 x 2.4 cm. This may represent bursitis or hematoma. Diffuse infiltration and fluid throughout the subcutaneous fat consistent with diffuse edema or possibly cellulitis. Mild skin thickening may indicate venous stasis or cellulitis. IMPRESSION: No evidence of acute fracture or dislocation of the right knee. Degenerative changes in the right knee. Moderate prepatellar and infrapatellar soft tissue hematomas versus bursitis. Diffuse edema in the subcutaneous tissues. Electronically Signed   By:  Burman NievesWilliam  Stevens M.D.   On: 05/22/2018 06:34   Dg Knee Complete 4 Views Right  Result Date: 05/13/2018 CLINICAL DATA:  Tripped over a metal bar and fell striking the right knee anteriorly. The patient is experiencing anterior knee pain extending medially and laterally. EXAM: RIGHT KNEE - COMPLETE 4+ VIEW COMPARISON:  None in PACs FINDINGS: The bones are subjectively osteopenic. There is no acute fracture nor dislocation. There is no joint effusion. The prepatellar soft tissues appear normal. There is mild narrowing of the medial joint compartment with minimal chondrocalcinosis noted in the medial meniscus. There is also lateral meniscal chondrocalcinosis. There is mild beaking of the tibial spines. IMPRESSION: No acute fracture or dislocation of the right knee. Mild osteoarthritic changes centered on the medial compartment. Electronically Signed   By: David  SwazilandJordan M.D.   On: 05/13/2018 12:21     LOS: 1 day   Jeoffrey MassedShanker Berlyn Malina, MD  [facility]  If 7PM-7AM, please contact night-coverage  Please page via [email or web address]-Password [email or web address]-click on MD name and type text message  05/23/2018, 11:36 AM

## 2018-05-24 DIAGNOSIS — L03115 Cellulitis of right lower limb: Secondary | ICD-10-CM

## 2018-05-24 LAB — BASIC METABOLIC PANEL
ANION GAP: 6 (ref 5–15)
BUN: 9 mg/dL (ref 8–23)
CALCIUM: 8.8 mg/dL — AB (ref 8.9–10.3)
CO2: 25 mmol/L (ref 22–32)
Chloride: 99 mmol/L (ref 98–111)
Creatinine, Ser: 0.6 mg/dL (ref 0.44–1.00)
Glucose, Bld: 91 mg/dL (ref 70–99)
POTASSIUM: 4.2 mmol/L (ref 3.5–5.1)
SODIUM: 130 mmol/L — AB (ref 135–145)

## 2018-05-24 MED ORDER — MUSCLE RUB 10-15 % EX CREA
TOPICAL_CREAM | CUTANEOUS | Status: DC | PRN
Start: 2018-05-24 — End: 2018-05-24
  Filled 2018-05-24: qty 85

## 2018-05-24 MED ORDER — CLINDAMYCIN HCL 300 MG PO CAPS: 300.0000 mg | ORAL_CAPSULE | Freq: Three times a day (TID) | ORAL | 0 refills | Status: DC

## 2018-05-24 NOTE — Progress Notes (Signed)
Tawana ScaleZach Smith D.O. [facility] [address] [place], [place] [address] Phone: [phone] Subjective:     CC: Knee pain  FAO:[id number]:Subjective  Grace Alvarado is a 84 y.o. female coming in with complaint of right knee pain. She fell on August 8th on the patella. Was in hospital for 3 days on antibiotics. Patient takes blood thinners. Has numbness and pain over patella and radiating into the leg. Patient states that her bruise was initially over the patella but the bruising spread throughout entire lower leg over time.  While patient was in the hospital laboratory work-up did not improve to being cellulitic.  Patient did have a CT scan of the knee noted that did not show any significant bony abnormality but did have what appeared to be a hematoma with soft tissue swelling that could have been cellulitic.  Patient since then has been taking oral antibiotics.  States that the pain is about the same as it is been for the last week.  States that may be the swelling and redness seems to be improving slowly.  Rates the severity pain still as low as 8 out of 10     Past Medical History:  Diagnosis Date  . A-fib (HCC)   . Atrial fibrillation (HCC)   . Hypertension   . Hypothyroidism   . Thyroid disease    Past Surgical History:  Procedure Laterality Date  . APPENDECTOMY    . c section    . CESAREAN SECTION    . CHOLECYSTECTOMY     Social History   Socioeconomic History  . Marital status: Widowed    Spouse name: Not on file  . Number of children: Not on file  . Years of education: Not on file  . Highest education level: Not on file  Occupational History  . Not on file  Social Needs  . Financial resource strain: Not on file  . Food insecurity:    Worry: Not on file    Inability: Not on file  . Transportation needs:    Medical: Not on file    Non-medical: Not on file  Tobacco Use  . Smoking status: Former Games developermoker  . Smokeless tobacco: Never Used  Substance and Sexual Activity   . Alcohol use: Not Currently  . Drug use: Never  . Sexual activity: Not Currently  Lifestyle  . Physical activity:    Days per week: Not on file    Minutes per session: Not on file  . Stress: Not on file  Relationships  . Social connections:    Talks on phone: Not on file    Gets together: Not on file    Attends religious service: Not on file    Active member of club or organization: Not on file    Attends meetings of clubs or organizations: Not on file    Relationship status: Not on file  Other Topics Concern  . Not on file  Social History Narrative  . Not on file   Allergies  Allergen Reactions  . Codeine Other (See Comments)    unknown  . Penicillins Other (See Comments)    unknown  . Vancomycin Other (See Comments)    unknown  . Zithromax [Azithromycin] Other (See Comments)    unknown  . Latex Rash   Family History  Problem Relation Age of Onset  . Cancer Mother   . Dementia Mother      Past medical history, social, surgical and family history all reviewed in electronic  medical record.  No pertanent information unless stated regarding to the chief complaint.   Review of Systems:Review of systems updated and as accurate as of 05/25/18  No headache, visual changes, nausea, vomiting, diarrhea, constipation, dizziness, abdominal pain, skin rash, fevers, chills, night sweats, weight loss, swollen lymph nodes, body aches, chest pain, shortness of breath, mood changes.  Positive muscle aches, joint swelling  Objective  Blood pressure 112/72, pulse 90, height 5\' 3"  (1.6 m), weight 150 lb (68 kg), SpO2 98 %. Systems examined below as of 05/25/18   General: No apparent distress alert and oriented x3 mood and affect normal, dressed appropriately.  HEENT: Pupils equal, extraocular movements intact  Respiratory: Patient's speak in full sentences and does not appear short of breath  Cardiovascular: 2+ lower extremity edema, non tender, no erythema  Skin: Warm dry intact  with no signs of infection or rash on extremities or on axial skeleton.  Abdomen: Soft nontender  Neuro: Cranial nerves II through XII are intact, neurovascularly intact in all extremities with 2+ DTRs and 2+ pulses.  Lymph: No lymphadenopathy of posterior or anterior cervical chain or axillae bilaterally.  Gait antalgic uses a walker MSK:  tender with limited range of motion but good stability and symmetric strength and tone of shoulders, elbows, wrist, hip, and ankles bilaterally. Significant arthritic changes of multiple joints Patient's right knee does have some mild instability noted.  Significant bruising still noted and patient does have a prepatellar hematoma noted that is severely tender to palpation.  Patient has 2+ pitting edema but symmetric to the contralateral side.   Limited musculoskeletal ultrasound was performed and interpreted by Judi SaaZachary M Smith  Limited ultrasound of patient's right knee shows that patient does have a hematoma and significant soft tissue swelling but does not appear to have increasing Doppler flow.  Patient does have a good amount of coagulated blood within the hematoma.   Impression and Recommendations:     This case required medical decision making of moderate complexity.      Note: This dictation was prepared with Dragon dictation along with smaller phrase technology. Any transcriptional errors that result from this process are unintentional.

## 2018-05-24 NOTE — Progress Notes (Signed)
Discharge instructions completed with pt.  Pt verbalized understanding of the information.  Pt denies chest pain, shortness of breath, dizziness, lightheadedness, and n/v.  Pt's IV discontinued.  Pt discharged home.  

## 2018-05-24 NOTE — Progress Notes (Signed)
Physical Therapy Evaluation  Pt admitted with above diagnosis. Pt currently with functional limitations due to the deficits listed below (see PT Problem List). At the time of evaluation pt performed transfers and ambulation with gross min guard to supervision. Initially ambulated with RW, but due to pt reporting low pain levels in R LE and difficulty sequencing with a step-through pattern with SPC, progressed to ambulating without the use of an AD. Pt performed LE exercises in chair and was educated on the importance of continuing exercise program at d/c. Recommending outpatient PT for pt at d/c to improve balance and safety in community to prevent future falls. Pt will benefit from skilled PT to increase their independence and safety with mobility to allow discharge to the venue listed below.        05/24/18 0936  PT Visit Information  Last PT Received On 05/24/18  Assistance Needed +1  History of Present Illness Elodia FlorenceMarie Lamour is a 84 y.o. female s/p cellultis. PMH includes chronic afib, HTN, chronic hyponatremia, hypothyroidism, and hyperlipidemia   Precautions  Precautions Fall  Restrictions  Weight Bearing Restrictions No  Home Living  Family/patient expects to be discharged to: Private residence  Living Arrangements Alone  Available Help at Discharge Family;Available 24 hours/day  Type of Home Apartment  Home Access Level entry  Home Layout One level  Bathroom Shower/Tub Tub/shower unit  Bathroom Toilet Handicapped height  Home Equipment Walker - standard;Grab bars - tub/shower;Toilet riser;Tub bench  Prior Function  Level of Independence Independent  Communication  Communication No difficulties  Pain Assessment  Pain Assessment Faces  Faces Pain Scale 2  Pain Location R medial knee  Pain Descriptors / Indicators Discomfort;Sore;Guarding  Pain Intervention(s) Limited activity within patient's tolerance;Monitored during session;Repositioned  Cognition  Arousal/Alertness  Awake/alert  Behavior During Therapy WFL for tasks assessed/performed  Overall Cognitive Status Within Functional Limits for tasks assessed  Upper Extremity Assessment  Upper Extremity Assessment Overall WFL for tasks assessed  Lower Extremity Assessment  Lower Extremity Assessment Overall WFL for tasks assessed  Cervical / Trunk Assessment  Cervical / Trunk Assessment Normal  Bed Mobility  Overal bed mobility Needs Assistance  Bed Mobility Supine to Sit  Supine to sit Supervision  General bed mobility comments HOB elevated upon arrival placing pt in an upright seated position; attempted to lower bed to perform bed mobility but pt quickly transitioned to EOB   Transfers  Overall transfer level Needs assistance  Equipment used Rolling walker (2 wheeled);None  Transfers Sit to/from Stand  Sit to AssurantStand Min guard;Supervision  General transfer comment Pt initally performed sit>stand with RW and VC for hand placement; through out session pt progressed well during ambulation wirthout the need for an AD; performed stand <> sit to hard surface without VCs for safety  Ambulation/Gait  Ambulation/Gait assistance Min guard;Supervision  Gait Distance (Feet) 200 Feet  Assistive device Rolling walker (2 wheeled);Straight cane;None  Gait Pattern/deviations Step-through pattern;Step-to pattern;Decreased stance time - right;Antalgic;Wide base of support  General Gait Details Pt initially ambulated with RW requiring VCs for upright posture and remaining inside [facility]; pt requested to ambulate without SPC as sequencing with SPC was difficult demonstrating a step-to pattern; pt progressed to ambulating without AD with min guard intially for safety progressing to supervsion  Gait velocity decreased  Gait velocity interpretation <1.31 ft/sec, indicative of household ambulator  Balance  Overall balance assessment Needs assistance  Sitting-balance support No upper extremity supported;Feet  supported  Sitting balance-Leahy Scale Good  Standing balance support No upper extremity supported  Standing balance-Leahy Scale Fair  Standing balance comment Pt progressed to static and dynamic standing activies without UE support required; mild deficits observed with wide base of support without AD  General Comments  General comments (skin integrity, edema, etc.) Daughter present during session  Exercises  Exercises General Lower Extremity  Total Joint Exercises  Ankle Circles/Pumps AROM;Both  Quad Sets AROM;Strengthening;Right;5 reps  Heel Slides AROM;Right;10 reps;Seated  Hip ABduction/ADduction AROM;Right;5 reps  Straight Leg Raises AAROM;5 reps;Right  Long Arc Quad AROM;10 reps;Seated  PT - End of Session  Equipment Utilized During Treatment Gait belt  Activity Tolerance Patient tolerated treatment well  Patient left in chair;with call bell/phone within reach;with chair alarm set;with family/visitor present  Nurse Communication Mobility status  PT Assessment  PT Recommendation/Assessment Patient needs continued PT services  PT Visit Diagnosis Unsteadiness on feet (R26.81);Other abnormalities of gait and mobility (R26.89);Muscle weakness (generalized) (M62.81);Pain  Pain - Right/Left Right  Pain - part of body Leg  PT Problem List Decreased strength;Decreased range of motion;Decreased activity tolerance;Decreased balance;Decreased mobility;Decreased coordination;Pain;Impaired sensation  PT Plan  PT Frequency (ACUTE ONLY) Min 3X/week  PT Treatment/Interventions (ACUTE ONLY) Gait training;Functional mobility training;Therapeutic activities;Balance training;Therapeutic exercise;Patient/family education  AM-PAC PT "6 Clicks" Daily Activity Outcome Measure  Difficulty turning over in bed (including adjusting bedclothes, sheets and blankets)? 3  Difficulty moving from lying on back to sitting on the side of the bed?  3  Difficulty sitting down on and standing up from a chair with  arms (e.g., wheelchair, bedside commode, etc,.)? 3  Help needed moving to and from a bed to chair (including a wheelchair)? 3  Help needed walking in hospital room? 3  Help needed climbing 3-5 steps with a railing?  2  6 Click Score 17  Mobility G Code  CK  PT Recommendation  Follow Up Recommendations Outpatient PT  PT equipment None recommended by PT  Individuals Consulted  Consulted and Agree with Results and Recommendations Patient  Acute Rehab PT Goals  Patient Stated Goal to go home   PT Goal Formulation With patient  Time For Goal Achievement 05/31/18  Potential to Achieve Goals Good  PT Time Calculation  PT Start Time (ACUTE ONLY) 0846  PT Stop Time (ACUTE ONLY) 0927  PT Time Calculation (min) (ACUTE ONLY) 41 min  PT General Charges  $$ ACUTE PT VISIT 1 Visit  PT Evaluation  $PT Eval Moderate Complexity 1 Mod  PT Treatments  $Gait Training 8-22 mins  $Therapeutic Exercise 8-22 mins  Written Expression  Dominant Hand Right   Donzetta KohutKaylee Trinka Keshishyan, SPT  Student Physical Therapist Acute Rehab [phone]

## 2018-05-24 NOTE — Discharge Summary (Signed)
Grace Alvarado AOZ:[id number] DOB: 01/19/1925 DOA: 05/21/2018  PCP: Grace MustardWolfe, Allison, MD  Admit date: 05/21/2018  Discharge date: 05/24/2018  Admitted From: Home   Disposition:  Home   Recommendations for Outpatient Follow-up:   Follow up with PCP in 1-2 weeks  PCP Please obtain BMP/CBC, 2 view CXR in 1week,  (see Discharge instructions)   PCP Please follow up on the following pending results:     Home Health: None Equipment/Devices: None  Consultations: None Discharge Condition: Stable   CODE STATUS: Full   Diet Recommendation:  Heart Healthy     Chief Complaint  Patient presents with  . Leg Swelling     Brief history of present illness from the day of admission and additional interim summary    Patient is a 84 y.o. female with history of chronic atrial fibrillation on Xarelto, hypertension, hypothyroidism, dyslipidemia who sustained a mechanical fall approximately 1 week prior to this hospital stay, she developed some hematoma in her right knee area and her right leg, a few days prior to this hospital stay, she started having worsening erythema and pain.  She was admitted to hospitalist service due to concern for superimposed cellulitis.  See below for further details                                                                 Hospital Course    Right lower extremity hematoma and bruising due to fall with injury to the right leg in the setting of being on Xarelto: There was some suspicion of patient having cellulitis however she never had any fever, no warmth in the right lower leg, no leukocytosis, her history and exam are quite suggestive of bruising and hematoma as suggested above, will give her 4 more days of oral clindamycin instead of 10-day course which she was supposed to finish by her outpatient  physician.  Continue probiotic.  Requested to keep her right lower extremity elevated as much as possible.  Resume Xarelto.  Follow with PCP closely.     Hyponatremia: Asymptomatic-suspect some amount of this may be chronic, sodium has improved to 128.  She is completely euvolemic-I suspect that she may have SIADH or reset thermostat.  Begin fluid restriction and follow.    Hypothyroidism: TSH is mildly elevated-not sure when Synthroid dosing was last adjusted-we will defer to patient's primary care practitioner.  Chronic atrial fibrillation: Rate controlled with Cardizem, anticoagulated with Xarelto.  Dyslipidemia: Continue statin   Discharge diagnosis     Principal Problem:   Cellulitis Active Problems:   Chronic a-fib (HCC)   Hypothyroid   CAD (coronary artery disease)   Pulmonary hypertension, primary (HCC)   Hyperlipidemia   CHF (congestive heart failure) [facility](HCC)    Discharge instructions    Discharge Instructions    Diet -  low sodium heart healthy   Complete by:  As directed    Discharge instructions   Complete by:  As directed    Follow with Primary MD Grace Mustard, MD in 3 days   Get CBC, TSH, BMP, checked  by Primary MD in 3 days    Activity: As tolerated with Full fall precautions use walker/cane & assistance as needed  Disposition Home    Diet:    Heart Healthy    For Heart failure patients - Check your Weight same time everyday, if you gain over 2 pounds, or you develop in leg swelling, experience more shortness of breath or chest pain, call your Primary MD immediately. Follow Cardiac Low Salt Diet and 1.5 lit/day fluid restriction.  Special Instructions: If you have smoked or chewed Tobacco  in the last 2 yrs please stop smoking, stop any regular Alcohol  and or any Recreational drug use.  On your next visit with your primary care physician please Get Medicines reviewed and adjusted.  Please request your Prim.MD to go over all Hospital Tests and  Procedure/Radiological results at the follow up, please get all Hospital records sent to your Prim MD by signing hospital release before you go home.  If you experience worsening of your admission symptoms, develop shortness of breath, life threatening emergency, suicidal or homicidal thoughts you must seek medical attention immediately by calling 911 or calling your MD immediately  if symptoms less severe.  You Must read complete instructions/literature along with all the possible adverse reactions/side effects for all the Medicines you take and that have been prescribed to you. Take any new Medicines after you have completely understood and accpet all the possible adverse reactions/side effects.   Increase activity slowly   Complete by:  As directed       Discharge Medications   Allergies as of 05/24/2018      Reactions   Codeine Other (See Comments)   unknown   Penicillins Other (See Comments)   unknown   Vancomycin Other (See Comments)   unknown   Zithromax [azithromycin] Other (See Comments)   unknown   Latex Rash      Medication List    TAKE these medications   acetaminophen 500 MG tablet Commonly known as:  TYLENOL Take 2 tablets (1,000 mg total) by mouth every 8 (eight) hours as needed for moderate pain. What changed:  how much to take   B-12 COMPLIANCE INJECTION IJ Inject 1 Applicatorful as directed every 30 (thirty) days.   cholecalciferol 1000 units tablet Commonly known as:  VITAMIN D Take 2,000 Units by mouth daily.   clindamycin 300 MG capsule Commonly known as:  CLEOCIN Take 1 capsule (300 mg total) by mouth 3 (three) times daily.   diclofenac sodium 1 % Gel Commonly known as:  VOLTAREN Apply 4 g topically 4 (four) times daily.   diltiazem 120 MG 12 hr capsule Commonly known as:  CARDIZEM SR Take 1 capsule (120 mg total) by mouth 2 (two) times daily.   levothyroxine 75 MCG tablet Commonly known as:  SYNTHROID, LEVOTHROID Take 1 tablet (75 mcg total)  by mouth daily before breakfast.   pravastatin 10 MG tablet Commonly known as:  PRAVACHOL Take 1 tablet (10 mg total) by mouth daily.   PROBIOTIC PO Take 1 tablet by mouth daily.   rivaroxaban 20 MG Tabs tablet Commonly known as:  XARELTO Take 20 mg by mouth daily with supper.   valsartan 40 MG tablet Commonly known as:  DIOVAN Take 40 mg by mouth daily as needed (hypertension).   vitamin C 500 MG tablet Commonly known as:  ASCORBIC ACID Take 500 mg by mouth daily.       Follow-up Information    Grace Mustard, MD. Schedule an appointment as soon as possible for a visit in 3 day(s).   Specialty:  Family Medicine Contact information: [address] [place] [address] [phone]           Major procedures and Radiology Reports - PLEASE review detailed and final reports thoroughly  -       Ct Knee Right Wo Contrast  Result Date: 05/22/2018 CLINICAL DATA:  Knee trauma. Can't walk. EXAM: CT OF THE right KNEE WITHOUT CONTRAST TECHNIQUE: Multidetector CT imaging of the right knee was performed according to the standard protocol. Multiplanar CT image reconstructions were also generated. COMPARISON:  Right knee radiographs 05/13/2018 FINDINGS: Bones/Joint/Cartilage Degenerative changes in the right knee with medial compartment narrowing and small osteophyte formation. No evidence of acute fracture or dislocation. Bone cortex appears intact. No significant effusion. Ligaments Suboptimally assessed by CT. Muscles and Tendons No intramuscular mass or hematoma. Soft tissues Moderate prepatellar and infrapatellar soft tissue collection, mildly hyperdense, measuring 6.4 x 4.7 x 2.4 cm. This may represent bursitis or hematoma. Diffuse infiltration and fluid throughout the subcutaneous fat consistent with diffuse edema or possibly cellulitis. Mild skin thickening may indicate venous stasis or cellulitis. IMPRESSION: No evidence of acute fracture or dislocation of the right knee.  Degenerative changes in the right knee. Moderate prepatellar and infrapatellar soft tissue hematomas versus bursitis. Diffuse edema in the subcutaneous tissues. Electronically Signed   By: Burman Nieves M.D.   On: 05/22/2018 06:34   Dg Knee Complete 4 Views Right  Result Date: 05/13/2018 CLINICAL DATA:  Tripped over a metal bar and fell striking the right knee anteriorly. The patient is experiencing anterior knee pain extending medially and laterally. EXAM: RIGHT KNEE - COMPLETE 4+ VIEW COMPARISON:  None in PACs FINDINGS: The bones are subjectively osteopenic. There is no acute fracture nor dislocation. There is no joint effusion. The prepatellar soft tissues appear normal. There is mild narrowing of the medial joint compartment with minimal chondrocalcinosis noted in the medial meniscus. There is also lateral meniscal chondrocalcinosis. There is mild beaking of the tibial spines. IMPRESSION: No acute fracture or dislocation of the right knee. Mild osteoarthritic changes centered on the medial compartment. Electronically Signed   By: David  [place] M.D.   On: 05/13/2018 12:21    Micro Results     No results found for this or any previous visit (from the past 240 hour(s)).  Today   Subjective    Grace Florence today has no headache,no chest abdominal pain,no new weakness tingling or numbness, feels much better wants to go home today.    Objective   Blood pressure 132/68, pulse 89, temperature 98.7 F (37.1 C), temperature source Oral, resp. rate 16, height 5\' 3"  (1.6 m), weight 68 kg, SpO2 96 %.   Intake/Output Summary (Last 24 hours) at 05/24/2018 1429 Last data filed at 05/24/2018 1100 Gross per 24 hour  Intake 240 ml  Output -  Net 240 ml    Exam Awake Alert, Oriented x 3, No new F.N deficits, Normal affect [place].AT,PERRAL Supple Neck,No JVD, No cervical lymphadenopathy appriciated.  Symmetrical Chest wall movement, Good air movement bilaterally, CTAB RRR,No Gallops,Rubs or new  Murmurs, No Parasternal Heave +ve B.Sounds, Abd Soft, Non tender, No organomegaly appriciated, No  rebound -guarding or rigidity. No Cyanosis, Clubbing or edema, right lower extremity bruise and hematoma around the right knee tracking all the way down to her ankle, no warmth   Data Review   CBC w Diff:  Lab Results  Component Value Date   WBC 4.2 05/23/2018   HGB 9.4 (L) 05/23/2018   HCT 28.2 (L) 05/23/2018   PLT 142 (L) 05/23/2018   LYMPHOPCT 23 05/21/2018   MONOPCT 12 05/21/2018   EOSPCT 2 05/21/2018   BASOPCT 0 05/21/2018    CMP:  Lab Results  Component Value Date   NA 130 (L) 05/24/2018   K 4.2 05/24/2018   CL 99 05/24/2018   CO2 25 05/24/2018   BUN 9 05/24/2018   CREATININE 0.60 05/24/2018  .   Total Time in preparing paper work, data evaluation and todays exam - 35 minutes  Susa RaringPrashant Singh M.D on 05/24/2018 at 2:29 PM  [facility]   Office  [phone]

## 2018-05-24 NOTE — Plan of Care (Signed)

## 2018-05-25 ENCOUNTER — Ambulatory Visit: Payer: Self-pay

## 2018-05-25 ENCOUNTER — Encounter: Payer: Self-pay | Admitting: Family Medicine

## 2018-05-25 ENCOUNTER — Telehealth: Payer: Self-pay | Admitting: *Deleted

## 2018-05-25 ENCOUNTER — Ambulatory Visit: Payer: Medicare Other | Admitting: Family Medicine

## 2018-05-25 VITALS — BP 112/72 | HR 90 | Ht 63.0 in | Wt 150.0 lb

## 2018-05-25 DIAGNOSIS — M25561 Pain in right knee: Secondary | ICD-10-CM

## 2018-05-25 DIAGNOSIS — S8001XA Contusion of right knee, initial encounter: Secondary | ICD-10-CM | POA: Diagnosis not present

## 2018-05-25 DIAGNOSIS — R609 Edema, unspecified: Secondary | ICD-10-CM | POA: Diagnosis not present

## 2018-05-25 MED ORDER — FUROSEMIDE 20 MG PO TABS: 10.0000 mg | ORAL_TABLET | Freq: Every day | ORAL | 3 refills | Status: DC | PRN

## 2018-05-25 NOTE — Telephone Encounter (Signed)
Per chart review: Admit date: 05/21/2018  Discharge date: 05/24/2018  Admitted From: Home   Disposition:  Home   Recommendations for Outpatient Follow-up:   Follow up with PCP in 1-2 weeks  PCP Please obtain BMP/CBC, 2 view CXR in 1week,  (see Discharge instructions)   PCP Please follow up on the following pending results:     Home Health: None Equipment/Devices: None  Consultations: None Discharge Condition: Stable   CODE STATUS: Full   Diet Recommendation:  Heart Healthy   ___________________________________________________________________________ Per telephone call:  Transition Care Management Follow-up Telephone Call   Date discharged? 05/24/18   How have you been since you were released from the hospital? "good" Patients daughter states that they saw Dr Katrinka BlazingSmith today and was given some good instructions.    Do you understand why you were in the hospital? yes   Do you understand the discharge instructions? yes   Where were you discharged to? Home   Items Reviewed:  Medications reviewed: yes  Allergies reviewed: yes  Dietary changes reviewed: yes  Referrals reviewed: yes   Functional Questionnaire:   Activities of Daily Living (ADLs):   She states they are independent in the following: ambulation, bathing and hygiene, feeding, continence, grooming, toileting and dressing States they require assistance with the following: none   Any transportation issues/concerns?: no   Any patient concerns? no   Confirmed importance and date/time of follow-up visits scheduled yes  Provider Appointment booked with Dr Artis FlockWolfe 05/27/18. AVS says to follow up in 3 days for repeat blood work.   Confirmed with patient if condition begins to worsen call PCP or go to the ER.  Patient was given the office number and encouraged to call back with question or concerns.  : yes

## 2018-05-25 NOTE — Patient Instructions (Addendum)
Good to see you  It is a hematoma and will take time.  Compression socks daily  Elevated legs above heart when you can  If you gain weight take 1/2 a lasix for 2 days then stop!  If confusion then go to ER Warm compresses for 10 minutes 3 times a day to the bump  Arnica lotion 2 times a day  eucerin or aveeno lotion 2 times a day  See me again in 2-3 weeks

## 2018-05-25 NOTE — Assessment & Plan Note (Signed)
Moderate nature.  Discussed icing regimen and home exercise.  Discussed which activities doing which wants to avoid.  Patient wants to try conservative therapy and avoid aspiration at this time.  Discussed over-the-counter medications.  With patient's significant amount of swelling and history of coronary artery disease and congestive heart failure monitor weight.  Given some mild Lasix and will give half tab up to 2 days in a row for any weight gain.  Patient is accompanied with daughters who are her primary caregivers.  We discussed if not completely gone in 2 to 3 weeks we should consider the aspiration of the hematoma.  They are in agreement with the plan and will follow-up at that time

## 2018-05-26 ENCOUNTER — Telehealth: Payer: Self-pay | Admitting: *Deleted

## 2018-05-26 ENCOUNTER — Ambulatory Visit: Payer: Medicare Other | Admitting: Family Medicine

## 2018-05-26 NOTE — Telephone Encounter (Signed)
Pt's daughter called stating that the pt's leg is still very red & is there anything she should be concerned about.  Per Dr. Katrinka BlazingSmith, as long as the pt is taking the blood thinner & abx then she should be fine. He advised them to stop the heat compress at this time.  Pt's daughter understood & confirmed that the pt is taking the blood thinner & abx.

## 2018-05-27 ENCOUNTER — Ambulatory Visit: Payer: Medicare Other | Admitting: Family Medicine

## 2018-05-27 ENCOUNTER — Ambulatory Visit (INDEPENDENT_AMBULATORY_CARE_PROVIDER_SITE_OTHER): Payer: Medicare Other

## 2018-05-27 ENCOUNTER — Encounter: Payer: Self-pay | Admitting: Family Medicine

## 2018-05-27 VITALS — BP 142/84 | HR 85 | Temp 97.8°F | Ht 63.0 in | Wt 155.0 lb

## 2018-05-27 DIAGNOSIS — E871 Hypo-osmolality and hyponatremia: Secondary | ICD-10-CM | POA: Diagnosis not present

## 2018-05-27 DIAGNOSIS — S8011XA Contusion of right lower leg, initial encounter: Secondary | ICD-10-CM | POA: Diagnosis not present

## 2018-05-27 DIAGNOSIS — E538 Deficiency of other specified B group vitamins: Secondary | ICD-10-CM | POA: Insufficient documentation

## 2018-05-27 DIAGNOSIS — L03115 Cellulitis of right lower limb: Secondary | ICD-10-CM

## 2018-05-27 DIAGNOSIS — E038 Other specified hypothyroidism: Secondary | ICD-10-CM | POA: Diagnosis not present

## 2018-05-27 LAB — BASIC METABOLIC PANEL
BUN: 11 mg/dL (ref 6–23)
CO2: 27 mEq/L (ref 19–32)
CREATININE: 0.62 mg/dL (ref 0.40–1.20)
Calcium: 9.8 mg/dL (ref 8.4–10.5)
Chloride: 100 mEq/L (ref 96–112)
GFR: 95.93 mL/min (ref >60.00)
GLUCOSE: 95 mg/dL (ref 70–99)
POTASSIUM: 4.6 meq/L (ref 3.5–5.1)
Sodium: 136 mEq/L (ref 135–145)

## 2018-05-27 LAB — CBC WITH DIFFERENTIAL/PLATELET
BASOS PCT: 0.8 % (ref 0.0–3.0)
Basophils Absolute: 0 10*3/uL (ref 0.0–0.1)
EOS ABS: 0 10*3/uL (ref 0.0–0.7)
Eosinophils Relative: 0.7 % (ref 0.0–5.0)
HEMATOCRIT: 33.6 % — AB (ref 36.0–46.0)
HEMOGLOBIN: 11.3 g/dL — AB (ref 12.0–15.0)
LYMPHS PCT: 19.8 % (ref 12.0–46.0)
Lymphs Abs: 0.8 10*3/uL (ref 0.7–4.0)
MCHC: 33.6 g/dL (ref 30.0–36.0)
MCV: 97.6 fl (ref 78.0–100.0)
MONOS PCT: 8 % (ref 3.0–12.0)
Monocytes Absolute: 0.3 10*3/uL (ref 0.1–1.0)
NEUTROS ABS: 2.9 10*3/uL (ref 1.4–7.7)
Neutrophils Relative %: 70.7 % (ref 43.0–77.0)
PLATELETS: 191 10*3/uL (ref 150.0–400.0)
RBC: 3.44 Mil/uL — ABNORMAL LOW (ref 3.87–5.11)
RDW: 14.8 % (ref 11.5–15.5)
WBC: 4.1 10*3/uL (ref 4.0–10.5)

## 2018-05-27 LAB — TSH: TSH: 6.06 u[IU]/mL — ABNORMAL HIGH (ref 0.35–4.50)

## 2018-05-27 LAB — T4, FREE: Free T4: 0.95 ng/dL (ref 0.60–1.60)

## 2018-05-27 LAB — VITAMIN B12: Vitamin B-12: >1500 pg/mL — ABNORMAL HIGH (ref 211–911)

## 2018-05-27 MED ORDER — CYANOCOBALAMIN 1000 MCG/ML IJ SOLN
1000.0000 ug | Freq: Once | INTRAMUSCULAR | Status: AC
  Administered 2018-05-27: 1000 ug via INTRAMUSCULAR

## 2018-05-27 NOTE — Progress Notes (Signed)
Patient: Grace Alvarado MRN: [id number] DOB: 07-23-1925 PCP: [name], MD     Subjective:  Chief Complaint  Patient presents with  . Hospitalization Follow-up    HPI: The patient is a 84 y.o. female who presents today for hospital follow up.  Admit date: 05/21/2018 Discharge date: 05/24/2018  Mrs. Grace Alvarado was seen in clinic by me and started on clindamycin for cellulitis around her hematoma on her right leg on 05/20/2018. On 05/21/2018 family felt that area was becoming increasigly worse with spreading erythema and pain. They took her to ER where she was admitted for superimposed cellulitis. Over course of hospital stay it was felt she likely did not have cellulitis. She never had a fever, warmth to leg, leukocytosis. They shortened oral clindamycin course to 4 days outpatient. CT of her knee was done that was unremarkable except for hematomas and diffuse edema in subcutaneous tissue. degenerative changes in the right knee as well.   Hyponatremia: asymptomatic and likely SIADH. Euvolemic. Urine studies done and she was put on low sodium diet. Urine studies point to SIADH vs. Thyroid.   Hypothyroidism: mildly elevated. Could be transient, will repeat today. Also was told they gave her medication after eating instead of on an empty stomach.    Review of Systems  Constitutional: Positive for fatigue.  Respiratory: Negative for shortness of breath.   Cardiovascular: Positive for leg swelling. Negative for chest pain.  Gastrointestinal: Negative for abdominal pain and nausea.  Musculoskeletal: Positive for arthralgias.       Continues w/left knee pain but improved since hospitalization  Neurological: Negative for dizziness and headaches.    Allergies Patient is allergic to codeine; penicillins; vancomycin; zithromax [azithromycin]; and latex.  Past Medical History Patient  has a past medical history of A-fib [facility]), Atrial fibrillation (HCC), Hypertension, Hypothyroidism, and Thyroid  disease.  Surgical History Patient  has a past surgical history that includes Appendectomy; Cholecystectomy; c section; and Cesarean section.  Family History Pateint's family history includes Cancer in her mother; Dementia in her mother.  Social History Patient  reports that she has quit smoking. She has never used smokeless tobacco. She reports that she drank alcohol. She reports that she does not use drugs.    Objective: Vitals:   05/27/18 1052  BP: (!) 142/84  Pulse: 85  Temp: 97.8 F (36.6 C)  TempSrc: Oral  SpO2: 97%  Weight: 155 lb (70.3 kg)  Height: 5\' 3"  (1.6 m)    Body mass index is 27.46 kg/m.  Physical Exam  Constitutional: She is oriented to person, place, and time. She appears well-developed and well-nourished.  Cardiovascular: Normal rate.  Murmur heard. Afib.  Bilateral pitting edema to above ankle in left leg (2+) and to knee in right leg due to trauma.   Pulmonary/Chest: Effort normal and breath sounds normal.  Abdominal: Soft. Bowel sounds are normal.  Neurological: She is alert and oriented to person, place, and time.  Skin:  Large hematoma over right knee. Much improved since I last saw her. Ecchymosis are resolving around her calf. Still has significant edema of lower leg. Pain improved.   Vitals reviewed.      Assessment/plan: 1. Cellulitis of right leg Unsure if she truly had cellulitis. Still on oral clindamycin and will finish this up. Looking much better and healing nicely. Again discussed hematoma will take some time to reabsorb, but is looking better already. Pain is improving. Controlled on tylenol 650mg . Finish out clindamycin and continue conservative therapy. Let me know if  change in symptoms.   2. Hyponatremia Work in up in hospital points to either SIADH or thyroid. She is fluid restricted. Repeating labs today per hospital discharge and cxr. She is asymptomatic.  - CBC with Differential/Platelet - Basic metabolic panel - TSH - T4,  free - DG Chest 2 View; Future  3. B12 deficiency Monthly injections. Due to today. Will also check level.  - cyanocobalamin ((VITAMIN B-12)) injection 1,000 mcg - Vitamin B12  4. Other specified hypothyroidism -rechecking labs. [place] in [place] to moving here. Likely a transient abnormality, but will adjust medication if we need to.       Return in about 6 months (around 11/27/2018).     Orland MustardAllison Dillyn Menna, MD [facility]  05/27/2018

## 2018-05-28 ENCOUNTER — Other Ambulatory Visit: Payer: Self-pay | Admitting: Family Medicine

## 2018-05-28 DIAGNOSIS — E038 Other specified hypothyroidism: Secondary | ICD-10-CM

## 2018-06-01 ENCOUNTER — Encounter: Payer: Self-pay | Admitting: Family Medicine

## 2018-06-01 NOTE — Progress Notes (Signed)
Referring-Allison Artis Flock, MD Reason for referral-atrial fibrillation  HPI: 84 year old female for evaluation of atrial fibrillation at request of [name], MD. Patient previously resided in [place].  Patient with history of permanent atrial fibrillation.  Previously failed amiodarone and multaq per records.  Nuclear study 2012 showed no ischemia with normal LV function.  Last echocardiogram February 2019 showed ejection fraction 54% with mild global hypokinesis.  Severe biatrial enlargement.  Trace aortic insufficiency, mitral valve prolapse, severe mitral regurgitation and moderate tricuspid regurgitation.  Patient apparently made it clear previously that she did not want valve surgery.  She was admitted in August 2019 after a mechanical fall.  She had a hematoma in her right knee.  There is also cellulitis which was treated.  She was noted to be hyponatremic as well.  TSH in August 2019 6.06.  Free T4 0.95. Cardiology asked to evaluate for atrial fibrillation.  Patient denies dyspnea, chest pain, palpitations or syncope.  She does have bilateral pedal edema.  Current Outpatient Medications  Medication Sig Dispense Refill  . acetaminophen (TYLENOL) 500 MG tablet Take 2 tablets (1,000 mg total) by mouth every 8 (eight) hours as needed for moderate pain. (Patient taking differently: Take 500 mg by mouth every 8 (eight) hours as needed for moderate pain. ) 30 tablet 0  . cholecalciferol (VITAMIN D) 1000 units tablet Take 2,000 Units by mouth daily.    . clindamycin (CLEOCIN) 300 MG capsule Take 1 capsule (300 mg total) by mouth 3 (three) times daily. 12 capsule 0  . Cyanocobalamin (B-12 COMPLIANCE INJECTION IJ) Inject 1 Applicatorful as directed every 30 (thirty) days.     . diclofenac sodium (VOLTAREN) 1 % GEL Apply 4 g topically 4 (four) times daily. 100 g 0  . diltiazem (CARDIZEM SR) 120 MG 12 hr capsule Take 1 capsule (120 mg total) by mouth 2 (two) times daily. 180 capsule 1  . furosemide  (LASIX) 20 MG tablet Take 0.5 tablets (10 mg total) by mouth daily as needed. 30 tablet 3  . levothyroxine (SYNTHROID, LEVOTHROID) 75 MCG tablet Take 1 tablet (75 mcg total) by mouth daily before breakfast. 90 tablet 3  . pravastatin (PRAVACHOL) 10 MG tablet Take 1 tablet (10 mg total) by mouth daily. 90 tablet 3  . Probiotic Product (PROBIOTIC PO) Take 1 tablet by mouth daily.    . rivaroxaban (XARELTO) 20 MG TABS tablet Take 20 mg by mouth daily with supper.    . valsartan (DIOVAN) 40 MG tablet Take 40 mg by mouth daily as needed (hypertension).     . vitamin C (ASCORBIC ACID) 500 MG tablet Take 500 mg by mouth daily.     No current facility-administered medications for this visit.     Allergies  Allergen Reactions  . Codeine Other (See Comments)    unknown  . Penicillins Other (See Comments)    unknown  . Vancomycin Other (See Comments)    unknown  . Zithromax [Azithromycin] Other (See Comments)    unknown  . Latex Rash     Past Medical History:  Diagnosis Date  . Atrial fibrillation (HCC)   . Hyperlipidemia   . Hypertension   . Hypothyroidism   . Mitral regurgitation   . MVP (mitral valve prolapse)   . Thyroid disease     Past Surgical History:  Procedure Laterality Date  . APPENDECTOMY    . c section    . CESAREAN SECTION    . CHOLECYSTECTOMY      Social  History   Socioeconomic History  . Marital status: Widowed    Spouse name: Not on file  . Number of children: 3  . Years of education: Not on file  . Highest education level: Not on file  Occupational History  . Not on file  Social Needs  . Financial resource strain: Not on file  . Food insecurity:    Worry: Not on file    Inability: Not on file  . Transportation needs:    Medical: Not on file    Non-medical: Not on file  Tobacco Use  . Smoking status: Former Games developermoker  . Smokeless tobacco: Never Used  Substance and Sexual Activity  . Alcohol use: Not Currently  . Drug use: Never  . Sexual  activity: Not Currently  Lifestyle  . Physical activity:    Days per week: Not on file    Minutes per session: Not on file  . Stress: Not on file  Relationships  . Social connections:    Talks on phone: Not on file    Gets together: Not on file    Attends religious service: Not on file    Active member of club or organization: Not on file    Attends meetings of clubs or organizations: Not on file    Relationship status: Not on file  . Intimate partner violence:    Fear of current or ex partner: Not on file    Emotionally abused: Not on file    Physically abused: Not on file    Forced sexual activity: Not on file  Other Topics Concern  . Not on file  Social History Narrative  . Not on file    Family History  Problem Relation Age of Onset  . Cancer Mother   . Dementia Mother     ROS: no fevers or chills, productive cough, hemoptysis, dysphasia, odynophagia, melena, hematochezia, dysuria, hematuria, rash, seizure activity, orthopnea, PND, claudication. Remaining systems are negative.  Physical Exam:   Blood pressure (!) 146/79, pulse 90, height 5\' 3"  (1.6 m), weight 151 lb 12.8 oz (68.9 kg).  General:  Well developed/well nourished in NAD Skin warm/dry Patient not depressed No peripheral clubbing Back-normal HEENT-normal/normal eyelids Neck supple/normal carotid upstroke bilaterally; no bruits; no JVD; no thyromegaly chest - CTA/ normal expansion CV - irregular/normal S1 and S2; no rubs or gallops;  PMI nondisplaced, 2/6 systolic murmur apex Abdomen -NT/ND, no HSM, no mass, + bowel sounds, no bruit 2+ femoral pulses, no bruits Ext-1+ edema, large area of ecchymosis right lower extremity.  Small hematoma distal to the knee. Neuro-grossly nonfocal  ECG -atrial fibrillation at a rate of 90.  No ST changes.  Personally reviewed  A/P  1 permanent atrial fibrillation-plan to continue Cardizem for rate control. CHADSvasc 4.  Continue Xarelto.  GFR is 65.  Continue at 20  mg.  Note she did have an accident recently and there is a hematoma on right lower extremity.  However she does not routinely fall.  2 mitral valve prolapse-most recent echocardiogram showed severe mitral regurgitation.  Patient is not having symptoms in her left ventricle was not dilated though question LV function mildly reduced.  Regardless given her age would like to be conservative if possible and she is in agreement.  She would like to avoid surgical procedures.  We will plan follow-up echocardiogram when she returns in 6 months.  3 mitral regurgitation-as outlined above.  4 hypertension-we will continue present blood pressure medications and follow.  5 lower extremity  edema-patient with increased lower extremity edema.  I will give Lasix 20 mg daily for 3 days and then resume 20 mg daily as needed.  Check potassium and renal function in 1 week.  6 hyperlipidemia-continue statin.  Olga Millers, MD

## 2018-06-01 NOTE — Telephone Encounter (Signed)
Dr. Durene CalHunter,  Please review CXR from 8/22 and advise  Thanks

## 2018-06-03 ENCOUNTER — Ambulatory Visit: Payer: Medicare Other | Admitting: Cardiology

## 2018-06-03 ENCOUNTER — Encounter: Payer: Self-pay | Admitting: Cardiology

## 2018-06-03 VITALS — BP 146/79 | HR 90 | Ht 63.0 in | Wt 151.8 lb

## 2018-06-03 DIAGNOSIS — I340 Nonrheumatic mitral (valve) insufficiency: Secondary | ICD-10-CM

## 2018-06-03 DIAGNOSIS — R609 Edema, unspecified: Secondary | ICD-10-CM

## 2018-06-03 DIAGNOSIS — I482 Chronic atrial fibrillation, unspecified: Secondary | ICD-10-CM

## 2018-06-03 DIAGNOSIS — I341 Nonrheumatic mitral (valve) prolapse: Secondary | ICD-10-CM | POA: Diagnosis not present

## 2018-06-03 DIAGNOSIS — I10 Essential (primary) hypertension: Secondary | ICD-10-CM

## 2018-06-03 NOTE — Patient Instructions (Signed)
Medication Instructions:   INCREASE FUROSEMIDE TO 20 MG ONCE DAILY FOR THE NEXT 3 DAYS THEN DECREASE BACK TO AS NEEDED  Labwork:  Your physician recommends that you return for lab work in: ONE WEEK  Follow-Up:  Your physician wants you to follow-up in: 6 MONTHS WITH DR Jens SomRENSHAW You will receive a reminder letter in the mail two months in advance. If you don't receive a letter, please call our office to schedule the follow-up appointment.   If you need a refill on your cardiac medications before your next appointment, please call your pharmacy.

## 2018-06-10 LAB — BASIC METABOLIC PANEL
BUN/Creatinine Ratio: 28 (ref 12–28)
BUN: 19 mg/dL (ref 10–36)
CALCIUM: 10.1 mg/dL (ref 8.7–10.3)
CO2: 25 mmol/L (ref 20–29)
CREATININE: 0.67 mg/dL (ref 0.57–1.00)
Chloride: 102 mmol/L (ref 96–106)
GFR calc Af Amer: 89 mL/min/{1.73_m2} (ref >59)
GFR, EST NON AFRICAN AMERICAN: 78 mL/min/{1.73_m2} (ref >59)
Glucose: 86 mg/dL (ref 65–99)
Potassium: 4.3 mmol/L (ref 3.5–5.2)
Sodium: 141 mmol/L (ref 134–144)

## 2018-06-11 NOTE — Progress Notes (Signed)
[name] [address] [place], [place] [address] Phone: [phone] Subjective:     I Grace Alvarado am serving as a Neurosurgeon for Dr. Antoine Primas.  CC: Right knee pain  LXB:[id number]  Grace Alvarado is a 84 y.o. female coming in with complaint of right knee pain. States that her knee is doing well today.  Patient was found to have a hematoma previously.  States that it is approximately 60% smaller in size.  Minimal discomfort.  Swelling in the lower extremities is improved with patient seen her cardiologist and doubling up the Lasix that we prescribed.  Overall feels like making progress.     Past Medical History:  Diagnosis Date  . Atrial fibrillation (HCC)   . Hyperlipidemia   . Hypertension   . Hypothyroidism   . Mitral regurgitation   . MVP (mitral valve prolapse)   . Thyroid disease    Past Surgical History:  Procedure Laterality Date  . APPENDECTOMY    . c section    . CESAREAN SECTION    . CHOLECYSTECTOMY     Social History   Socioeconomic History  . Marital status: Widowed    Spouse name: Not on file  . Number of children: 3  . Years of education: Not on file  . Highest education level: Not on file  Occupational History  . Not on file  Social Needs  . Financial resource strain: Not on file  . Food insecurity:    Worry: Not on file    Inability: Not on file  . Transportation needs:    Medical: Not on file    Non-medical: Not on file  Tobacco Use  . Smoking status: Former Games developer  . Smokeless tobacco: Never Used  Substance and Sexual Activity  . Alcohol use: Not Currently  . Drug use: Never  . Sexual activity: Not Currently  Lifestyle  . Physical activity:    Days per week: Not on file    Minutes per session: Not on file  . Stress: Not on file  Relationships  . Social connections:    Talks on phone: Not on file    Gets together: Not on file    Attends religious service: Not on file    Active member of club or  organization: Not on file    Attends meetings of clubs or organizations: Not on file    Relationship status: Not on file  Other Topics Concern  . Not on file  Social History Narrative  . Not on file   Allergies  Allergen Reactions  . Codeine Other (See Comments)    unknown  . Penicillins Other (See Comments)    unknown  . Vancomycin Other (See Comments)    unknown  . Zithromax [Azithromycin] Other (See Comments)    unknown  . Latex Rash   Family History  Problem Relation Age of Onset  . Cancer Mother   . Dementia Mother     Current Outpatient Medications (Endocrine & Metabolic):  .  levothyroxine (SYNTHROID, LEVOTHROID) 75 MCG tablet, Take 1 tablet (75 mcg total) by mouth daily before breakfast.  Current Outpatient Medications (Cardiovascular):  .  diltiazem (CARDIZEM SR) 120 MG 12 hr capsule, Take 1 capsule (120 mg total) by mouth 2 (two) times daily. .  furosemide (LASIX) 20 MG tablet, Take 0.5 tablets (10 mg total) by mouth daily as needed. .  pravastatin (PRAVACHOL) 10 MG tablet, Take 1 tablet (10 mg total) by mouth daily. Marland Kitchen  valsartan (DIOVAN) 40 MG tablet, Take 40 mg by mouth daily as needed (hypertension).    Current Outpatient Medications (Analgesics):  .  acetaminophen (TYLENOL) 500 MG tablet, Take 2 tablets (1,000 mg total) by mouth every 8 (eight) hours as needed for moderate pain. (Patient taking differently: Take 500 mg by mouth every 8 (eight) hours as needed for moderate pain. )  Current Outpatient Medications (Hematological):  Marland Kitchen  Cyanocobalamin (B-12 COMPLIANCE INJECTION IJ), Inject 1 Applicatorful as directed every 30 (thirty) days.  .  rivaroxaban (XARELTO) 20 MG TABS tablet, Take 20 mg by mouth daily with supper.  Current Outpatient Medications (Other):  .  cholecalciferol (VITAMIN D) 1000 units tablet, Take 2,000 Units by mouth daily. .  clindamycin (CLEOCIN) 300 MG capsule, Take 1 capsule (300 mg total) by mouth 3 (three) times daily. .  diclofenac  sodium (VOLTAREN) 1 % GEL, Apply 4 g topically 4 (four) times daily. .  Probiotic Product (PROBIOTIC PO), Take 1 tablet by mouth daily. .  vitamin C (ASCORBIC ACID) 500 MG tablet, Take 500 mg by mouth daily.    Past medical history, social, surgical and family history all reviewed in electronic medical record.  No pertanent information unless stated regarding to the chief complaint.   Review of Systems:  No headache, visual changes, nausea, vomiting, diarrhea, constipation, dizziness, abdominal pain, skin rash, fevers, chills, night sweats, weight loss, swollen lymph nodes, body aches,   chest pain, shortness of breath, mood changes.  Positive muscle aches, joint swelling  Objective  Blood pressure 120/62, pulse 93, height 5\' 3"  (1.6 m), weight 147 lb (66.7 kg), SpO2 98 %.   General: No apparent distress alert  mood and affect normal, dressed appropriately.  HEENT: Pupils equal, extraocular movements intact  Respiratory: Patient's speak in full sentences and does not appear short of breath  Cardiovascular: 1+ lower extremity edema, non tender, no erythema  Skin: Warm dry intact with no signs of infection or rash on extremities or on axial skeleton.  Abdomen: Soft nontender  Neuro: Cranial nerves II through XII are intact, neurovascularly intact in all extremities with 2+ DTRs and 2+ pulses.  Lymph: No lymphadenopathy of posterior or anterior cervical chain or axillae bilaterally.  Gait antalgic walking with the aid of a cane MSK: Arthritic changes of multiple joints Right knee exam shows arthritic changes.  Patient's previous hematoma is approximately 60% smaller.  Fluctuant still noted.  No signs of any infectious etiology.  Patient does have some mild exfoliation of the skin.  Lower extremity swelling still noted   Impression and Recommendations:     This case required medical decision making of moderate complexity. The above documentation has been reviewed and is accurate and  complete Judi Saa, DO       Note: This dictation was prepared with Dragon dictation along with smaller phrase technology. Any transcriptional errors that result from this process are unintentional.

## 2018-06-14 ENCOUNTER — Ambulatory Visit: Payer: Medicare Other | Admitting: Family Medicine

## 2018-06-14 ENCOUNTER — Encounter: Payer: Self-pay | Admitting: Family Medicine

## 2018-06-14 DIAGNOSIS — S8001XA Contusion of right knee, initial encounter: Secondary | ICD-10-CM | POA: Diagnosis not present

## 2018-06-14 NOTE — Assessment & Plan Note (Addendum)
Improvement noted.  Discussed icing regimen and home exercise.  Follow-up in 6 weeks

## 2018-06-14 NOTE — Patient Instructions (Signed)
Doing great  \Aveeno or eucerin at least daily  Lets watch it another 2 months See me again in 2 months

## 2018-06-21 ENCOUNTER — Ambulatory Visit: Payer: Medicare Other | Admitting: Internal Medicine

## 2018-06-29 ENCOUNTER — Encounter: Payer: Self-pay | Admitting: Family Medicine

## 2018-06-30 ENCOUNTER — Other Ambulatory Visit (INDEPENDENT_AMBULATORY_CARE_PROVIDER_SITE_OTHER): Payer: Medicare Other

## 2018-06-30 ENCOUNTER — Ambulatory Visit (INDEPENDENT_AMBULATORY_CARE_PROVIDER_SITE_OTHER): Payer: Medicare Other

## 2018-06-30 ENCOUNTER — Other Ambulatory Visit: Payer: Self-pay

## 2018-06-30 DIAGNOSIS — Z23 Encounter for immunization: Secondary | ICD-10-CM | POA: Diagnosis not present

## 2018-06-30 NOTE — Progress Notes (Signed)
Per orders of Dr. Artis Flock, injection of Prevnar 13 given right deltoid IM by Olevia Bowens, CMA Patient tolerated injection well.

## 2018-06-30 NOTE — Progress Notes (Unsigned)
pre

## 2018-07-08 ENCOUNTER — Other Ambulatory Visit (INDEPENDENT_AMBULATORY_CARE_PROVIDER_SITE_OTHER): Payer: Medicare Other

## 2018-07-08 DIAGNOSIS — E038 Other specified hypothyroidism: Secondary | ICD-10-CM | POA: Diagnosis not present

## 2018-07-08 LAB — TSH: TSH: 2.93 u[IU]/mL (ref 0.35–4.50)

## 2018-07-08 LAB — T4, FREE: FREE T4: 1.24 ng/dL (ref 0.60–1.60)

## 2018-07-30 ENCOUNTER — Ambulatory Visit: Payer: Self-pay | Admitting: Podiatry

## 2018-08-17 ENCOUNTER — Ambulatory Visit: Payer: Medicare Other | Admitting: Family Medicine

## 2018-08-23 ENCOUNTER — Encounter: Payer: Self-pay | Admitting: Family Medicine

## 2018-08-25 ENCOUNTER — Encounter: Payer: Self-pay | Admitting: Family Medicine

## 2018-08-27 ENCOUNTER — Encounter: Payer: Self-pay | Admitting: Family Medicine

## 2018-09-06 ENCOUNTER — Other Ambulatory Visit: Payer: Self-pay

## 2018-09-06 ENCOUNTER — Encounter: Payer: Self-pay | Admitting: Family Medicine

## 2018-09-06 DIAGNOSIS — E538 Deficiency of other specified B group vitamins: Secondary | ICD-10-CM

## 2018-09-07 ENCOUNTER — Other Ambulatory Visit (INDEPENDENT_AMBULATORY_CARE_PROVIDER_SITE_OTHER): Payer: Medicare Other

## 2018-09-07 DIAGNOSIS — E538 Deficiency of other specified B group vitamins: Secondary | ICD-10-CM

## 2018-09-07 LAB — VITAMIN B12: Vitamin B-12: 409 pg/mL (ref 211–911)

## 2018-09-07 NOTE — Progress Notes (Signed)
[facility] [address] [place], [place] [address] Phone: [phone] Subjective:    I Grace NighKana Alvarado am serving as a Neurosurgeonscribe for Dr. Antoine PrimasZachary Smith.    CC: Knee pain follow-up  BJY:[id number]:Subjective  Grace Alvarado is a 84 y.o. female coming in with complaint of right knee pain. States that the knee is slowly getting better. Still feels numb.  Patient states that he is able to walk, dance, having no significant instability of the knee.  Feels like making progress.     Past Medical History:  Diagnosis Date  . Atrial fibrillation (HCC)   . Hyperlipidemia   . Hypertension   . Hypothyroidism   . Mitral regurgitation   . MVP (mitral valve prolapse)   . Thyroid disease    Past Surgical History:  Procedure Laterality Date  . APPENDECTOMY    . c section    . CESAREAN SECTION    . CHOLECYSTECTOMY     Social History   Socioeconomic History  . Marital status: Widowed    Spouse name: Not on file  . Number of children: 3  . Years of education: Not on file  . Highest education level: Not on file  Occupational History  . Not on file  Social Needs  . Financial resource strain: Not on file  . Food insecurity:    Worry: Not on file    Inability: Not on file  . Transportation needs:    Medical: Not on file    Non-medical: Not on file  Tobacco Use  . Smoking status: Former Games developermoker  . Smokeless tobacco: Never Used  Substance and Sexual Activity  . Alcohol use: Not Currently  . Drug use: Never  . Sexual activity: Not Currently  Lifestyle  . Physical activity:    Days per week: Not on file    Minutes per session: Not on file  . Stress: Not on file  Relationships  . Social connections:    Talks on phone: Not on file    Gets together: Not on file    Attends religious service: Not on file    Active member of club or organization: Not on file    Attends meetings of clubs or organizations: Not on file    Relationship status: Not on file  Other Topics  Concern  . Not on file  Social History Narrative  . Not on file   Allergies  Allergen Reactions  . Codeine Other (See Comments)    unknown  . Penicillins Other (See Comments)    unknown  . Vancomycin Other (See Comments)    unknown  . Zithromax [Azithromycin] Other (See Comments)    unknown  . Latex Rash   Family History  Problem Relation Age of Onset  . Cancer Mother   . Dementia Mother     Current Outpatient Medications (Endocrine & Metabolic):  .  levothyroxine (SYNTHROID, LEVOTHROID) 75 MCG tablet, Take 1 tablet (75 mcg total) by mouth daily before breakfast.  Current Outpatient Medications (Cardiovascular):  .  diltiazem (CARDIZEM SR) 120 MG 12 hr capsule, Take 1 capsule (120 mg total) by mouth 2 (two) times daily. .  furosemide (LASIX) 20 MG tablet, Take 0.5 tablets (10 mg total) by mouth daily as needed. .  pravastatin (PRAVACHOL) 10 MG tablet, Take 1 tablet (10 mg total) by mouth daily. .  valsartan (DIOVAN) 40 MG tablet, Take 40 mg by mouth daily as needed (hypertension).    Current Outpatient Medications (Analgesics):  .  acetaminophen (TYLENOL) 500 MG tablet, Take 2 tablets (1,000 mg total) by mouth every 8 (eight) hours as needed for moderate pain. (Patient taking differently: Take 500 mg by mouth every 8 (eight) hours as needed for moderate pain. )  Current Outpatient Medications (Hematological):  Marland Kitchen  Cyanocobalamin (B-12 COMPLIANCE INJECTION IJ), Inject 1 Applicatorful as directed every 30 (thirty) days.  .  rivaroxaban (XARELTO) 20 MG TABS tablet, Take 20 mg by mouth daily with supper.  Current Outpatient Medications (Other):  .  cholecalciferol (VITAMIN D) 1000 units tablet, Take 2,000 Units by mouth daily. .  clindamycin (CLEOCIN) 300 MG capsule, Take 1 capsule (300 mg total) by mouth 3 (three) times daily. .  diclofenac sodium (VOLTAREN) 1 % GEL, Apply 4 g topically 4 (four) times daily. .  Probiotic Product (PROBIOTIC PO), Take 1 tablet by mouth daily. .   vitamin C (ASCORBIC ACID) 500 MG tablet, Take 500 mg by mouth daily.    Past medical history, social, surgical and family history all reviewed in electronic medical record.  No pertanent information unless stated regarding to the chief complaint.   Review of Systems:  No headache, visual changes, nausea, vomiting, diarrhea, constipation, dizziness, abdominal pain, skin rash, fevers, chills, night sweats, weight loss, swollen lymph nodes, body aches, chest pain, shortness of breath, mood changes.  Positive muscle aches and joint swelling  Objective  Blood pressure 136/64, pulse (!) 103, height 5\' 3"  (1.6 m), weight 147 lb (66.7 kg), SpO2 96 %.   General: No apparent distress alert and oriented x3 mood and affect normal, dressed appropriately.  HEENT: Pupils equal, extraocular movements intact  Respiratory: Patient's speak in full sentences and does not appear short of breath  Cardiovascular: Race lower extremity edema, non tender, no erythema  Skin: Warm dry intact with no signs of infection or rash on extremities or on axial skeleton.  Abdomen: Soft nontender  Neuro: Cranial nerves II through XII are intact, neurovascularly intact in all extremities with 2+ DTRs and 2+ pulses.  Lymph: No lymphadenopathy of posterior or anterior cervical chain or axillae bilaterally.  Gait mild antalgic MSK:  tender with mild limited range of motion and good stability and symmetric strength and tone of shoulders, elbows, wrist, hip, and ankles bilaterally.  Right knee exam shows some very mild bruising still over the tibial tuberosity but otherwise fairly unremarkable.  Good range of motion with some crepitus of the knee noted.    Impression and Recommendations:     The above documentation has been reviewed and is accurate and complete Judi Saa, DO       Note: This dictation was prepared with Dragon dictation along with smaller phrase technology. Any transcriptional errors that result from  this process are unintentional.

## 2018-09-08 ENCOUNTER — Encounter: Payer: Self-pay | Admitting: Family Medicine

## 2018-09-08 ENCOUNTER — Ambulatory Visit: Payer: Medicare Other | Admitting: Family Medicine

## 2018-09-08 DIAGNOSIS — S8001XD Contusion of right knee, subsequent encounter: Secondary | ICD-10-CM

## 2018-09-08 NOTE — Telephone Encounter (Signed)
Patient was just here yesterday for b12. Can they come again today?  Copied from CRM [id number]. Topic: General - Other >> Sep 08, 2018 11:19 AM Marylen PontoMcneil, Ja-Kwan wrote: Reason for CRM: Pt daughter stated pt received approval for B 12 injection. Request call back for scheduling.

## 2018-09-08 NOTE — Assessment & Plan Note (Signed)
Discussed with patient in great length.  Discussed home exercises and icing regimen.  Patient as long as it does well will follow-up as needed

## 2018-09-09 ENCOUNTER — Ambulatory Visit (INDEPENDENT_AMBULATORY_CARE_PROVIDER_SITE_OTHER): Payer: Medicare Other

## 2018-09-09 DIAGNOSIS — E538 Deficiency of other specified B group vitamins: Secondary | ICD-10-CM | POA: Diagnosis not present

## 2018-09-09 MED ORDER — CYANOCOBALAMIN 1000 MCG/ML IJ SOLN
1000.0000 ug | Freq: Once | INTRAMUSCULAR | Status: AC
  Administered 2018-09-09: 1000 ug via INTRAMUSCULAR

## 2018-09-09 NOTE — Patient Instructions (Signed)
Health Maintenance Due  Topic Date Due  . TETANUS/TDAP  07/18/1944  . DEXA SCAN  07/18/1992    Depression screen PHQ 2/9 05/17/2018  Decreased Interest 0  Down, Depressed, Hopeless 1  PHQ - 2 Score 1

## 2018-09-09 NOTE — Progress Notes (Signed)
Patient here today for a B12 injection. Administered in left arm. Tolerated well.  

## 2018-10-12 ENCOUNTER — Ambulatory Visit (INDEPENDENT_AMBULATORY_CARE_PROVIDER_SITE_OTHER): Payer: Medicare Other

## 2018-10-12 DIAGNOSIS — E538 Deficiency of other specified B group vitamins: Secondary | ICD-10-CM | POA: Diagnosis not present

## 2018-10-12 MED ORDER — CYANOCOBALAMIN 1000 MCG/ML IJ SOLN
1000.0000 ug | Freq: Once | INTRAMUSCULAR | Status: AC
  Administered 2018-10-12: 1000 ug via INTRAMUSCULAR

## 2018-10-12 NOTE — Progress Notes (Signed)
Per orders of Dr. Artis Flock, injection of Vitamin b12 1000 mcg given left deltoid IM (pt requested injection be given in left arm) by Olevia Bowens, CMA Patient tolerated injection well.  I scheduled appt for pt to return on 2/4 for her next injection.

## 2018-10-20 ENCOUNTER — Ambulatory Visit: Payer: Self-pay | Admitting: *Deleted

## 2018-10-20 NOTE — Telephone Encounter (Signed)
Contacted pt regarding leg [facility];nurse triage initiated per nurse triage protocol; the pt would [facility]; spoke with [place], and pt offered and accepted appointment with Dr Cheryll CockayneStacy Burns 11/02/13 at 1330; pt also offered and accepted appointment with Dr Orland MustardAllison Wolfe, [facility], 10/21/2018 at 0820; she once again verbalizes understanding; will route to office for notification.  Reason for Disposition . [1] MODERATE leg swelling (e.g., swelling extends up to knees) AND [2] new onset or worsening  Answer Assessment - Initial Assessment Questions 1. ONSET: "When did the swelling start?" (e.g., minutes, hours, days)     10/06/2018 2. LOCATION: "What part of the leg is swollen?"  "Are both legs swollen or just one leg?"     Calves and ankles L>R 3. SEVERITY: "How bad is the swelling?" (e.g., localized; mild, moderate, severe)  - Localized - small area of swelling localized to one leg  - MILD pedal edema - swelling limited to foot and ankle, pitting edema < 1/4 inch (6 mm) deep, rest and elevation eliminate most or all swelling  - MODERATE edema - swelling of lower leg to knee, pitting edema > 1/4 inch (6 mm) deep, rest and elevation only partially reduce swelling  - SEVERE edema - swelling extends above knee, facial or hand swelling present      moderate 4. REDNESS: "Does the swelling look red or infected?"     no 5. PAIN: "Is the swelling painful to touch?" If so, ask: "How painful is it?"   (Scale 1-10; mild, moderate or severe)     Intermittent calf pain when getting in and out of bed; rated mild 6. FEVER: "Do you have a fever?" If so, ask: "What is it, how was it measured, and when did it start?"      no 7. CAUSE: "What do you think is causing the leg swelling?"     History of leg swelling 8. MEDICAL HISTORY: "Do you have a history of heart failure, kidney disease, liver failure, or cancer?"    afib 9. RECURRENT  SYMPTOM: "Have you had leg swelling before?" If so, ask: "When was the last time?" "What happened that time?"     Yes ongoing 10. OTHER SYMPTOMS: "Do you have any other symptoms?" (e.g., chest pain, difficulty breathing)       no 11. PREGNANCY: "Is there any chance you are pregnant?" "When was your last menstrual period?"       n/a  Protocols used: LEG SWELLING AND EDEMA-A-AH

## 2018-10-20 NOTE — Telephone Encounter (Signed)
Noted.  Dr. Artis Flock aware

## 2018-10-20 NOTE — Telephone Encounter (Signed)
See note

## 2018-10-21 ENCOUNTER — Ambulatory Visit: Payer: Medicare Other | Admitting: Family Medicine

## 2018-10-21 ENCOUNTER — Telehealth: Payer: Self-pay | Admitting: Radiology

## 2018-10-21 ENCOUNTER — Encounter: Payer: Self-pay | Admitting: Family Medicine

## 2018-10-21 ENCOUNTER — Telehealth: Payer: Self-pay | Admitting: Family Medicine

## 2018-10-21 VITALS — BP 118/62 | HR 56 | Temp 97.6°F | Ht 63.0 in | Wt 149.0 lb

## 2018-10-21 DIAGNOSIS — R600 Localized edema: Secondary | ICD-10-CM | POA: Diagnosis not present

## 2018-10-21 DIAGNOSIS — Z5181 Encounter for therapeutic drug level monitoring: Secondary | ICD-10-CM | POA: Diagnosis not present

## 2018-10-21 DIAGNOSIS — D508 Other iron deficiency anemias: Secondary | ICD-10-CM

## 2018-10-21 LAB — CBC WITH DIFFERENTIAL/PLATELET
BASOS ABS: 0 10*3/uL (ref 0.0–0.1)
BASOS PCT: 0.8 % (ref 0.0–3.0)
EOS ABS: 0 10*3/uL (ref 0.0–0.7)
EOS PCT: 1 % (ref 0.0–5.0)
HEMATOCRIT: 24.5 % — AB (ref 36.0–46.0)
Hemoglobin: 7.8 g/dL — CL (ref 12.0–15.0)
LYMPHS ABS: 1 10*3/uL (ref 0.7–4.0)
Lymphocytes Relative: 22.2 % (ref 12.0–46.0)
MCHC: 31.8 g/dL (ref 30.0–36.0)
MCV: 87.1 fl (ref 78.0–100.0)
MONO ABS: 0.5 10*3/uL (ref 0.1–1.0)
Monocytes Relative: 10.8 % (ref 3.0–12.0)
NEUTROS ABS: 2.9 10*3/uL (ref 1.4–7.7)
NEUTROS PCT: 65.2 % (ref 43.0–77.0)
Platelets: 149 10*3/uL — ABNORMAL LOW (ref 150.0–400.0)
RBC: 2.81 Mil/uL — ABNORMAL LOW (ref 3.87–5.11)
RDW: 15 % (ref 11.5–15.5)
WBC: 4.5 10*3/uL (ref 4.0–10.5)

## 2018-10-21 LAB — COMPREHENSIVE METABOLIC PANEL
ALBUMIN: 3.9 g/dL (ref 3.5–5.2)
ALT: 13 U/L (ref 0–35)
AST: 19 U/L (ref 0–37)
Alkaline Phosphatase: 44 U/L (ref 39–117)
BUN: 11 mg/dL (ref 6–23)
CHLORIDE: 105 meq/L (ref 96–112)
CO2: 28 mEq/L (ref 19–32)
CREATININE: 0.74 mg/dL (ref 0.40–1.20)
Calcium: 9.7 mg/dL (ref 8.4–10.5)
GFR: 73.52 mL/min (ref >60.00)
GLUCOSE: 103 mg/dL — AB (ref 70–99)
POTASSIUM: 4.3 meq/L (ref 3.5–5.1)
SODIUM: 139 meq/L (ref 135–145)
Total Bilirubin: 0.6 mg/dL (ref 0.2–1.2)
Total Protein: 6.7 g/dL (ref 6.0–8.3)

## 2018-10-21 LAB — TSH: TSH: 3.86 u[IU]/mL (ref 0.35–4.50)

## 2018-10-21 LAB — BRAIN NATRIURETIC PEPTIDE: Pro B Natriuretic peptide (BNP): 426 pg/mL — ABNORMAL HIGH (ref 0.0–100.0)

## 2018-10-21 MED ORDER — FERROUS GLUCONATE 324 (38 FE) MG PO TABS: 324.0000 mg | ORAL_TABLET | Freq: Three times a day (TID) | ORAL | 0 refills | Status: DC

## 2018-10-21 MED ORDER — FLUTICASONE PROPIONATE 50 MCG/ACT NA SUSP: 2.0000 | Freq: Every day | NASAL | 6 refills | Status: DC

## 2018-10-21 NOTE — Progress Notes (Signed)
Patient: Grace Alvarado MRN: [id number] DOB: 05/22/1925 PCP: [name], MD     Subjective:  Chief Complaint  Patient presents with  . Cough  . leg swelling L>R    HPI: The patient is a 85 y.o. female who presents today for leg swelling but L>R. This is a chronic issue and had in [place]. She thinks it has progressively gotten worse over the past month. She states it goes down and is much better in the AM, but gets worse through the day as she is on her feet. She has pain in the back of her bilateral calves at times, but L>R.  It gets hard in her calf. If she tries to flex past 90 degrees, it hurts her. She also feels like the swelling makes her walking "not as good.". She denies it being red or hot to the touch. She feels like the right leg gets a little swollen, but nothing like the left leg. She does not wear compression hose or elevate her legs. Her diet has changed and she has been eating out more. She does have CHF and is a few pounds over, but nearly close to baseline. She does have a cough. She doesn't feel like she has increased orthopnea. She has lasix, but has not used at all. She is chronically anticoagulated with xarelto for afib. She does not feel like her heart has been beating fasting and she denies any shortness of breath.   Cough: She has had a dry cough for a few months. She has been cough drops only. She has occasionally used flonase in the AM, but not a regular basis. No allergy medication, no otc cough medication. No shortness of breath or wheezing.    Review of Systems  Respiratory: Negative for shortness of breath.   Cardiovascular: Positive for leg swelling. Negative for chest pain.       C/o swelling b/l legs.  L>R.  Gastrointestinal: Negative for abdominal pain and nausea.  Musculoskeletal: Positive for back pain and myalgias. Negative for neck pain.       C/o back of left calf being painful  Neurological: Positive for headaches. Negative for dizziness.   Psychiatric/Behavioral: Positive for sleep disturbance. The patient is nervous/anxious.     Allergies Patient is allergic to codeine; penicillins; vancomycin; zithromax [azithromycin]; and latex.  Past Medical History Patient  has a past medical history of Atrial fibrillation (HCC), Hyperlipidemia, Hypertension, Hypothyroidism, Mitral regurgitation, MVP (mitral valve prolapse), and Thyroid disease.  Surgical History Patient  has a past surgical history that includes Appendectomy; Cholecystectomy; c section; and Cesarean section.  Family History Pateint's family history includes Cancer in her mother; Dementia in her mother.  Social History Patient  reports that she has quit smoking. She has never used smokeless tobacco. She reports previous alcohol use. She reports that she does not use drugs.    Objective: Vitals:   10/21/18 0806  BP: 118/62  Pulse: (!) 56  Temp: 97.6 F (36.4 C)  TempSrc: Oral  SpO2: 99%  Weight: 149 lb (67.6 kg)  Height: 5\' 3"  (1.6 m)    Body mass index is 26.39 kg/m.  Physical Exam Vitals signs reviewed.  Constitutional:      Appearance: She is obese.  HENT:     Right Ear: Tympanic membrane, ear canal and external ear normal.     Left Ear: Tympanic membrane, ear canal and external ear normal.     Nose: Nose normal.     Mouth/Throat:  Mouth: Mucous membranes are moist.     Comments: No cobblestoning  Neck:     Musculoskeletal: Normal range of motion and neck supple.  Cardiovascular:     Rate and Rhythm: Rhythm irregular.     Heart sounds: Murmur present.  Pulmonary:     Effort: Pulmonary effort is normal. No respiratory distress.     Breath sounds: Normal breath sounds. No wheezing or rales.  Abdominal:     General: Abdomen is flat. Bowel sounds are normal.     Palpations: Abdomen is soft.  Musculoskeletal:     Right lower leg: Edema (1+ pitting around ankles, edema to lower calf. negative homan, but pain with palpation in the posterior  calf. no ertyhema or warmth ) present.     Left lower leg: Edema (2+ pitting edema to ankle, edema to mid calf. no warmth, pain with palpation in posterior calf, but negative homan's sign. skin thick from edema and edema to mid calf is not pitting minimal erythema ) present.  Neurological:     Mental Status: She is alert.        Assessment/plan: 1. Bilateral lower extremity edema Chronic history, but has been getting worse over past month. No signs of DVT and chronically anticoagulated with xarelto. She has lasix for edema, but has not used at all. We are going to check labs today and have her start lasix at 40mg  x 3 days then 20mg  daily. Will have her come back for bmp in one week to make sure I don't need to supplement potassium. Im going to keep her on 20mg /day. She has f/u with cardiology next month. I also recommended compression hose and leg elevation, but she has not done these. She has been eating out more and likely eating more sodium. She is not fluid overloaded and I do not think she has any CHF exacerbation. Will check BNP today. ER precautions given for worsening edema, redness or pain. It also appears she is establishing care with Dr. Lawerance Bach and she can also check on this when she sees her.  - Comprehensive metabolic panel - CBC with Differential/Platelet - TSH - Brain natriuretic peptide - Basic metabolic panel; Future  2. Therapeutic drug monitoring  - Basic metabolic panel; Future     Return if symptoms worsen or fail to improve.   [name], MD [facility]   10/21/2018

## 2018-10-21 NOTE — Telephone Encounter (Signed)
Called and spoke to patient and her 2 daughters regarding hgb of 7.8. patient still feels fine and is asymptomatic. We are going to have her start oral iron TID if she can tolerate, come in first thing in the AM for repeat cbc/FOBT and holding her xaralto. She took her dose today.  Informed her cardiologist that I was holding this and he would like it stared back as soon as we determine she is not bleeding from anywhere.   Daughter stated she had anemia issue in [place] and saw hematologist who fixed it. Does not think she was bleeding then, just had low hgb.   Very strict ER precautions given. If she just doesn't feel good, starts having any shortness of breath, chest pain, etc. I want them to take her to ER. If hgb lower tomorrow, will likely send to ER.   [name], MD [facility]

## 2018-10-21 NOTE — Patient Instructions (Addendum)
I want you to take 40mg  of lasix daily for THREE DAYS then go back to 20mg /day. Would check labs in one week. Watch diet and sodium intake. Compression hose and elevate legs.

## 2018-10-21 NOTE — Telephone Encounter (Signed)
Please see message. FYI 

## 2018-10-21 NOTE — Telephone Encounter (Signed)
Dr. Artis Flock is aware and has contacted pt to notify her.

## 2018-10-21 NOTE — Telephone Encounter (Signed)
CRITICAL VALUE STICKER  CRITICAL VALUE: 7.8 HGB  RECEIVER (on-site recipient of call):Parnika Tweten RT(R)  DATE & TIME NOTIFIED: 10/20/18@1521   MESSENGER (representative from lab): Clydie Braun  MD NOTIFIED: Lanney Gins  TIME OF NOTIFICATION:1526  RESPONSE:

## 2018-10-21 NOTE — Telephone Encounter (Signed)
See telephone encounter in her chart.

## 2018-10-22 ENCOUNTER — Other Ambulatory Visit (INDEPENDENT_AMBULATORY_CARE_PROVIDER_SITE_OTHER): Payer: Medicare Other

## 2018-10-22 ENCOUNTER — Observation Stay (HOSPITAL_COMMUNITY)
Admission: EM | Admit: 2018-10-22 | Discharge: 2018-10-23 | Disposition: A | Discharge status: Home / Self Care | Payer: Medicare Other | Attending: Internal Medicine | Admitting: Internal Medicine

## 2018-10-22 ENCOUNTER — Encounter (HOSPITAL_COMMUNITY): Payer: Self-pay | Admitting: Internal Medicine

## 2018-10-22 ENCOUNTER — Telehealth: Payer: Self-pay

## 2018-10-22 ENCOUNTER — Telehealth: Payer: Self-pay | Admitting: Family Medicine

## 2018-10-22 ENCOUNTER — Other Ambulatory Visit: Payer: Self-pay | Admitting: Radiology

## 2018-10-22 ENCOUNTER — Telehealth: Payer: Self-pay | Admitting: Radiology

## 2018-10-22 ENCOUNTER — Other Ambulatory Visit: Payer: Self-pay | Admitting: Family Medicine

## 2018-10-22 DIAGNOSIS — E039 Hypothyroidism, unspecified: Secondary | ICD-10-CM | POA: Diagnosis present

## 2018-10-22 DIAGNOSIS — I10 Essential (primary) hypertension: Secondary | ICD-10-CM | POA: Diagnosis not present

## 2018-10-22 DIAGNOSIS — D61818 Other pancytopenia: Secondary | ICD-10-CM

## 2018-10-22 DIAGNOSIS — D508 Other iron deficiency anemias: Secondary | ICD-10-CM | POA: Diagnosis not present

## 2018-10-22 DIAGNOSIS — Z79890 Hormone replacement therapy (postmenopausal): Secondary | ICD-10-CM | POA: Diagnosis not present

## 2018-10-22 DIAGNOSIS — Z66 Do not resuscitate: Secondary | ICD-10-CM | POA: Diagnosis not present

## 2018-10-22 DIAGNOSIS — I4891 Unspecified atrial fibrillation: Secondary | ICD-10-CM | POA: Insufficient documentation

## 2018-10-22 DIAGNOSIS — E785 Hyperlipidemia, unspecified: Secondary | ICD-10-CM | POA: Diagnosis not present

## 2018-10-22 DIAGNOSIS — I482 Chronic atrial fibrillation, unspecified: Secondary | ICD-10-CM | POA: Insufficient documentation

## 2018-10-22 DIAGNOSIS — Z87891 Personal history of nicotine dependence: Secondary | ICD-10-CM | POA: Insufficient documentation

## 2018-10-22 DIAGNOSIS — D649 Anemia, unspecified: Principal | ICD-10-CM | POA: Insufficient documentation

## 2018-10-22 DIAGNOSIS — I890 Lymphedema, not elsewhere classified: Secondary | ICD-10-CM | POA: Diagnosis not present

## 2018-10-22 DIAGNOSIS — Z79899 Other long term (current) drug therapy: Secondary | ICD-10-CM | POA: Insufficient documentation

## 2018-10-22 DIAGNOSIS — R0602 Shortness of breath: Secondary | ICD-10-CM | POA: Diagnosis present

## 2018-10-22 DIAGNOSIS — M7989 Other specified soft tissue disorders: Secondary | ICD-10-CM | POA: Diagnosis present

## 2018-10-22 DIAGNOSIS — Z5181 Encounter for therapeutic drug level monitoring: Secondary | ICD-10-CM | POA: Diagnosis not present

## 2018-10-22 DIAGNOSIS — I341 Nonrheumatic mitral (valve) prolapse: Secondary | ICD-10-CM | POA: Diagnosis not present

## 2018-10-22 DIAGNOSIS — R799 Abnormal finding of blood chemistry, unspecified: Secondary | ICD-10-CM | POA: Diagnosis present

## 2018-10-22 DIAGNOSIS — R600 Localized edema: Secondary | ICD-10-CM

## 2018-10-22 DIAGNOSIS — Z7901 Long term (current) use of anticoagulants: Secondary | ICD-10-CM | POA: Diagnosis not present

## 2018-10-22 LAB — CBC WITH DIFFERENTIAL/PLATELET
BASOS ABS: 0 10*3/uL (ref 0.0–0.1)
Basophils Relative: 0.4 % (ref 0.0–3.0)
Eosinophils Absolute: 0 10*3/uL (ref 0.0–0.7)
Eosinophils Relative: 0.9 % (ref 0.0–5.0)
HCT: 23 % — CL (ref 36.0–46.0)
Hemoglobin: 7.5 g/dL — CL (ref 12.0–15.0)
Lymphocytes Relative: 22.7 % (ref 12.0–46.0)
Lymphs Abs: 0.8 10*3/uL (ref 0.7–4.0)
MCHC: 32.7 g/dL (ref 30.0–36.0)
MCV: 85.1 fl (ref 78.0–100.0)
Monocytes Absolute: 0.4 10*3/uL (ref 0.1–1.0)
Monocytes Relative: 10.4 % (ref 3.0–12.0)
NEUTROS PCT: 65.6 % (ref 43.0–77.0)
Neutro Abs: 2.5 10*3/uL (ref 1.4–7.7)
Platelets: 148 10*3/uL — ABNORMAL LOW (ref 150.0–400.0)
RBC: 2.7 Mil/uL — ABNORMAL LOW (ref 3.87–5.11)
RDW: 15.1 % (ref 11.5–15.5)
WBC: 3.7 10*3/uL — ABNORMAL LOW (ref 4.0–10.5)

## 2018-10-22 LAB — CBC
HCT: 25.5 % — ABNORMAL LOW (ref 36.0–46.0)
Hemoglobin: 7.9 g/dL — ABNORMAL LOW (ref 12.0–15.0)
MCH: 27.8 pg (ref 26.0–34.0)
MCHC: 31 g/dL (ref 30.0–36.0)
MCV: 89.8 fL (ref 80.0–100.0)
Platelets: 170 10*3/uL (ref 150–400)
RBC: 2.84 MIL/uL — ABNORMAL LOW (ref 3.87–5.11)
RDW: 14.4 % (ref 11.5–15.5)
WBC: 4.8 10*3/uL (ref 4.0–10.5)
nRBC: 0 % (ref 0.0–0.2)

## 2018-10-22 LAB — COMPREHENSIVE METABOLIC PANEL
ALT: 18 U/L (ref 0–44)
AST: 23 U/L (ref 15–41)
Albumin: 3.8 g/dL (ref 3.5–5.0)
Alkaline Phosphatase: 39 U/L (ref 38–126)
Anion gap: 11 (ref 5–15)
BUN: 13 mg/dL (ref 8–23)
CO2: 22 mmol/L (ref 22–32)
CREATININE: 0.79 mg/dL (ref 0.44–1.00)
Calcium: 9.3 mg/dL (ref 8.9–10.3)
Chloride: 104 mmol/L (ref 98–111)
GFR calc Af Amer: >60 mL/min (ref >60)
GFR calc non Af Amer: >60 mL/min (ref >60)
Glucose, Bld: 125 mg/dL — ABNORMAL HIGH (ref 70–99)
Potassium: 3.7 mmol/L (ref 3.5–5.1)
Sodium: 137 mmol/L (ref 135–145)
Total Bilirubin: 0.7 mg/dL (ref 0.3–1.2)
Total Protein: 6.8 g/dL (ref 6.5–8.1)

## 2018-10-22 LAB — BASIC METABOLIC PANEL
BUN: 12 mg/dL (ref 6–23)
CO2: 27 mEq/L (ref 19–32)
Calcium: 9.3 mg/dL (ref 8.4–10.5)
Chloride: 104 mEq/L (ref 96–112)
Creatinine, Ser: 0.77 mg/dL (ref 0.40–1.20)
GFR: 70.23 mL/min (ref >60.00)
Glucose, Bld: 91 mg/dL (ref 70–99)
Potassium: 3.9 mEq/L (ref 3.5–5.1)
Sodium: 138 mEq/L (ref 135–145)

## 2018-10-22 LAB — PREPARE RBC (CROSSMATCH)

## 2018-10-22 LAB — ABO/RH: ABO/RH(D): A POS

## 2018-10-22 LAB — IBC PANEL
Iron: 24 ug/dL — ABNORMAL LOW (ref 42–145)
SATURATION RATIOS: 5.5 % — AB (ref 20.0–50.0)
Transferrin: 312 mg/dL (ref 212.0–360.0)

## 2018-10-22 LAB — IFOBT (OCCULT BLOOD): IFOBT: NEGATIVE

## 2018-10-22 LAB — FERRITIN: Ferritin: 6.5 ng/mL — ABNORMAL LOW (ref 10.0–291.0)

## 2018-10-22 LAB — IRON: Iron: 24 ug/dL — ABNORMAL LOW (ref 42–145)

## 2018-10-22 MED ORDER — LEVOTHYROXINE SODIUM 75 MCG PO TABS
75.0000 ug | ORAL_TABLET | Freq: Every day | ORAL | Status: DC
  Administered 2018-10-23: 75 ug via ORAL
  Filled 2018-10-22: qty 1

## 2018-10-22 MED ORDER — SODIUM CHLORIDE 0.9% IV SOLUTION: Freq: Once | INTRAVENOUS | Status: DC

## 2018-10-22 MED ORDER — ENOXAPARIN SODIUM 40 MG/0.4ML ~~LOC~~ SOLN
40.0000 mg | SUBCUTANEOUS | Status: DC
  Administered 2018-10-22: 40 mg via SUBCUTANEOUS
  Filled 2018-10-22: qty 0.4

## 2018-10-22 MED ORDER — ONDANSETRON HCL 4 MG PO TABS: 4.0000 mg | ORAL_TABLET | Freq: Four times a day (QID) | ORAL | Status: DC | PRN

## 2018-10-22 MED ORDER — ACETAMINOPHEN 650 MG RE SUPP: 650.0000 mg | Freq: Four times a day (QID) | RECTAL | Status: DC | PRN

## 2018-10-22 MED ORDER — DILTIAZEM HCL ER 60 MG PO CP12
120.0000 mg | ORAL_CAPSULE | Freq: Two times a day (BID) | ORAL | Status: DC
  Administered 2018-10-22 – 2018-10-23 (×2): 120 mg via ORAL
  Filled 2018-10-22 (×2): qty 2

## 2018-10-22 MED ORDER — ACETAMINOPHEN 325 MG PO TABS: 650.0000 mg | ORAL_TABLET | Freq: Four times a day (QID) | ORAL | Status: DC | PRN

## 2018-10-22 MED ORDER — SODIUM CHLORIDE 0.9 % IV SOLN: 10.0000 mL/h | Freq: Once | INTRAVENOUS | Status: DC

## 2018-10-22 MED ORDER — DOCUSATE SODIUM 100 MG PO CAPS
100.0000 mg | ORAL_CAPSULE | Freq: Two times a day (BID) | ORAL | Status: DC
  Administered 2018-10-23: 100 mg via ORAL
  Filled 2018-10-22 (×2): qty 1

## 2018-10-22 MED ORDER — ONDANSETRON HCL 4 MG/2ML IJ SOLN: 4.0000 mg | Freq: Four times a day (QID) | INTRAMUSCULAR | Status: DC | PRN

## 2018-10-22 NOTE — ED Triage Notes (Signed)
Pt arrives from PCP for hbg of 7/1- pt stopped blood thinner yesterday due to abnormal lab.

## 2018-10-22 NOTE — Telephone Encounter (Signed)
Great instructions and all correct!

## 2018-10-22 NOTE — Telephone Encounter (Signed)
Patient brought in by her daughter, Grace Alvarado this morning for repeat labs.  Both patient and her daughter had some questions and concerns about patient's visit from yesterday.  Patient's daughter, Grace Alvarado had concerns that her mom's heart rate was somewhat low yesterday at 56bpm, stating concerns since her mom has afib.  I reassured her and explained that a normal resting HR was between 60-90 bpm.  Also had questions about instructions restarting her Lasix rx that she apparently had from previous doctor.  I advised per notes of Dr. Artis Flock that she is to take 40 mg of Lasix for the next 3 days and then titrate dosage down to continuously take 20 mg until further notice.  I also repeatedly went over instructions for collecting FOB samples.  Both patient and her daughter verbalized understanding.  I spent 10 mins with them going over all instructions and giving reassurance.

## 2018-10-22 NOTE — H&P (Signed)
History and Physical    Grace Alvarado ZOX:[id number] DOB: 1924-12-29 DOA: 10/22/2018  PCP: Orland MustardWolfe, Allison, MD Consultants:  [facility] - cardiology; Katrinka BlazingSmith - orthopedics Patient coming from:  Home - lives alone - [place] from [place] in August; NOK: Daughters, [phone]; [phone]  Chief Complaint: Abnormal lab  HPI: Grace Alvarado is a 85 y.o. female with medical history significant of hypothroidism; HTN; HLD; and afib presenting with low Hgb.  The doctor said her blood was way down and it was a big problem.  Her Hgb was 7.5 and so her PCP sent her in.  The Xarelto was stopped yesterday, missing her second dose right now.  She saw her doctor because her leg was swollen.  She reports LE edema on the left.  The edema is dependent only.  Of late, it seems to be worse and the leg is hard but it goes down in the morning.  This has been going on for about a month.  She has a h/o afib.  It is starting to limit her movement.  She does have neuropathy as well.  +SOB.  No significant fatigue.  She is able to complete her ADLs.  She no longer drives.  ED Course:  Hbg 7.9.  Leg swelling.  On Xarelto, has not seen blood in stool.  Heme negative in office today.  Baseline 10-11.  She is not symptomatic.   Review of Systems: As per HPI; otherwise review of systems reviewed and negative.   Ambulatory Status:  Ambulates without assistance  Past Medical History:  Diagnosis Date  . Atrial fibrillation (HCC)   . Hyperlipidemia   . Hypertension   . Hypothyroidism   . Mitral regurgitation   . MVP (mitral valve prolapse)     Past Surgical History:  Procedure Laterality Date  . APPENDECTOMY    . c section    . CESAREAN SECTION    . CHOLECYSTECTOMY      Social History   Socioeconomic History  . Marital status: Widowed    Spouse name: Not on file  . Number of children: 3  . Years of education: Not on file  . Highest education level: Not on file  Occupational History  . Occupation: retired   Engineer, productionocial Needs  . Financial resource strain: Not on file  . Food insecurity:    Worry: Not on file    Inability: Not on file  . Transportation needs:    Medical: Not on file    Non-medical: Not on file  Tobacco Use  . Smoking status: Former Games developermoker  . Smokeless tobacco: Never Used  Substance and Sexual Activity  . Alcohol use: Not Currently  . Drug use: Never  . Sexual activity: Not Currently  Lifestyle  . Physical activity:    Days per week: Not on file    Minutes per session: Not on file  . Stress: Not on file  Relationships  . Social connections:    Talks on phone: Not on file    Gets together: Not on file    Attends religious service: Not on file    Active member of club or organization: Not on file    Attends meetings of clubs or organizations: Not on file    Relationship status: Not on file  . Intimate partner violence:    Fear of current or ex partner: Not on file    Emotionally abused: Not on file    Physically abused: Not on file    Forced sexual activity: Not  on file  Other Topics Concern  . Not on file  Social History Narrative  . Not on file    Allergies  Allergen Reactions  . Codeine Other (See Comments)    unknown  . Penicillins Other (See Comments)    unknown  . Vancomycin Other (See Comments)    unknown  . Zithromax [Azithromycin] Other (See Comments)    unknown  . Latex Rash    Family History  Problem Relation Age of Onset  . Cancer Mother   . Dementia Mother     Prior to Admission medications   Medication Sig Start Date End Date Taking? Authorizing Provider  acetaminophen (TYLENOL) 500 MG tablet Take 2 tablets (1,000 mg total) by mouth every 8 (eight) hours as needed for moderate pain. Patient taking differently: Take 500 mg by mouth every 8 (eight) hours as needed for moderate pain.  05/13/18   Georgetta HaberBurky, Natalie B, NP  cholecalciferol (VITAMIN D) 1000 units tablet Take 2,000 Units by mouth daily.    [provider]  clindamycin  (CLEOCIN) 300 MG capsule Take 1 capsule (300 mg total) by mouth 3 (three) times daily. 05/24/18   Leroy SeaSingh, Prashant K, MD  Cyanocobalamin (B-12 COMPLIANCE INJECTION IJ) Inject 1 Applicatorful as directed every 30 (thirty) days.     [provider]  diclofenac sodium (VOLTAREN) 1 % GEL Apply 4 g topically 4 (four) times daily. 05/17/18   Orland MustardWolfe, Allison, MD  diltiazem (CARDIZEM SR) 120 MG 12 hr capsule Take 1 capsule (120 mg total) by mouth 2 (two) times daily. 05/17/18   Orland MustardWolfe, Allison, MD  ferrous gluconate (FERGON) 324 MG tablet Take 1 tablet (324 mg total) by mouth 3 (three) times daily with meals. 10/21/18   Orland MustardWolfe, Allison, MD  fluticasone (FLONASE) 50 MCG/ACT nasal spray Place 2 sprays into both nostrils daily. 10/21/18   Orland MustardWolfe, Allison, MD  furosemide (LASIX) 20 MG tablet Take 0.5 tablets (10 mg total) by mouth daily as needed. 05/25/18   Judi SaaSmith, Zachary M, DO  levothyroxine (SYNTHROID, LEVOTHROID) 75 MCG tablet Take 1 tablet (75 mcg total) by mouth daily before breakfast. 05/17/18   Orland MustardWolfe, Allison, MD  pravastatin (PRAVACHOL) 10 MG tablet Take 1 tablet (10 mg total) by mouth daily. 05/17/18   Orland MustardWolfe, Allison, MD  Probiotic Product (PROBIOTIC PO) Take 1 tablet by mouth daily.    [provider]  rivaroxaban (XARELTO) 20 MG TABS tablet Take 20 mg by mouth daily with supper.    [provider]  valsartan (DIOVAN) 40 MG tablet Take 40 mg by mouth daily as needed (hypertension).     [provider]  vitamin C (ASCORBIC ACID) 500 MG tablet Take 500 mg by mouth daily.    [provider]    Physical Exam: Vitals:   10/22/18 1306 10/22/18 1545 10/22/18 1630 10/22/18 1645  BP: (!) 130/99 (!) 118/42 127/63 (!) 119/57  Pulse: 60 99 96 90  Resp: 16 19 13 18   Temp: 98 F (36.7 C)     TempSrc: Oral     SpO2: 99% 100% 100% 100%     . General:  Appears calm and comfortable and is NAD . Eyes:  PERRL, EOMI, normal lids, iris . ENT:  grossly normal hearing, lips &  tongue, mmm; appropriate dentition . Neck:  no LAD, masses or thyromegaly . Cardiovascular:  Irregularly irregular, rate about 100, no m/r/g.   . Respiratory:   CTA bilaterally with no wheezes/rales/rhonchi.  Normal respiratory effort. . Abdomen:  soft,  NT, ND, NABS . Skin:  no rash or induration seen on limited exam . Musculoskeletal:  grossly normal tone BUE/BLE, good ROM, no bony abnormality.  She appears to have lymphedema of the LLE as the calf is clearly larger than the right without erythema, cords, negative Homan's . Psychiatric:  grossly normal mood and affect, speech fluent and appropriate, AOx3 . Neurologic:  CN 2-12 grossly intact, moves all extremities in coordinated fashion, sensation intact    Radiological Exams on Admission: No results found.  EKG: not done   Labs on Admission: I have personally reviewed the available labs and imaging studies at the time of the admission.  Pertinent labs:   Glucose 125 CMP otherwise WNL WBC 4.8 Hgb 7.9, normal MCV and RDW; 11.3 on 05/27/18; 9.4 on 05/23/18 Heme negative   Assessment/Plan Principal Problem:   Symptomatic anemia Active Problems:   Chronic a-fib   Essential hypertension   Hypothyroid   Hyperlipidemia   Lymphedema of left lower extremity   Symptomatic anemia -Patient with somewhat incidental finding of anemia by PCP -It is not entirely clear that she is symptomatic, but she is 85yo and has been on [facility] and so it likely to benefit from transfusion -Hgb 7.9 -Normal MCV and RDW -Heme testing was negative at PCP office -Will observe in med surg bed. -Transfuse 2 units PRBC and recheck CBC afterwards.  -Patient counseled about short- and long-term risks associated with transfusion and consents to receive blood products. -Patient was briefly discussed with Dr. Myna Hidalgo, who suggests checking erythropoietin level, as she is probably not making epo.  Will also check iron studies.   -She is likely to benefit from  Procrit or Aranesp, but this is a discussion that will be appropriate at outpatient hematology appointment (which it appears her PCP is arranging in 1-2 weeks).   -Anticipate d/c to home tomorrow after transfusion.  Lymphedema -Patient with chronic LLE which appears to be dependent in nature  -Unlikely to be CHF-related given it unilateral nature -Instead, suspect lymphedema -She has been on chronic AC and so it is extremely unlikely that there is a DVT there, but treatment failure with [facility] does happen and so a DVT US is reasonable; if positive, would need ongoing AC more urgently (see below) and also consideration of an IVC filter -If [place] is negative, lymphedema is the most likely diagnosis and will order compression stockings accordingly  Afib -She is rate controlled with Diltiazem, will continue -She has been on Xarelto but this is being held due to her anemia - although she is heme negative -Careful consideration of risks/benefits should be discussed with PCP and/or cardiology as an outpatient in the next 1-2 weeks, as her CHA2DS2-Vasc score is 4, with a 4%/year stroke risk  HTN -Continue Diltiazem  HLD -It appears that the patient has stopped taking pravastatin -Given her age, this is not unreasonable as her long-term benefit from this medication is likely smaller than the risk of side effects  Hypothyroidism -Normal TSH and free T4 in 10/19 and normal TSH on 1/16 - will not repeat -Continue Synthroid at current dose for now   DVT prophylaxis:  Lovenox  Code Status:  DNR - confirmed with patient/family Family Communication: Daughters were present throughout evaluation  Disposition Plan:  Home once clinically improved Consults called: Hematology by telephone only  Admission status: It is my clinical opinion that referral for OBSERVATION is reasonable and necessary in this patient based on the above information provided. The aforementioned taken together  are felt to place the patient  at high risk for further clinical deterioration. However it is anticipated that the patient may be medically stable for discharge from the hospital within 24 to 48 hours.    Jonah Blue MD [facility]  If note is complete, please contact covering daytime or nighttime physician. [email or web address]   10/22/2018, 5:26 PM

## 2018-10-22 NOTE — Addendum Note (Signed)
Addended by: [name] on: 10/22/2018 10:33 AM   Modules accepted: Orders

## 2018-10-22 NOTE — Progress Notes (Signed)
Repeat hgb this am at 7.5 from 7.8. FOBT was negative x 1 sample. She is asymptomatic and feels fine. Rest of her labs were normal except for BNP of 426. Lasix started with no orthopnea, weight gain. Leg swelling only. Called and discussed labs with patient and daughter. Discussed that next step would be stat referral to GI to rule out bleed with endoscopies. Patient refuses this and does not want this done. She is going to start her iron today. We discussed next option and they are okay seeing hematologist. Will try to get stat referral done. Added on peripheral smear as all of her lines dropped. Likely due to anemia, but will rule out malignancy as well.  Repeating CBC on Monday. If continues to drop will send to ER and they understand ER precautions over weekend. Would like me to keep cardiologist informed as they are concerned about stopping the xaralto. Her chads2 score is a 3. Again stressed with patient family that we can not start back xaralto with possible bleed.   [name], MD [facility]

## 2018-10-22 NOTE — ED Provider Notes (Signed)
[facility] EMERGENCY DEPARTMENT Provider Note   CSN: [id number] Arrival date & time: 10/22/18  1258     History   Chief Complaint Chief Complaint  Patient presents with  . Abnormal Lab    HPI Jennah Hilliker is a 85 y.o. female.  Patient is a 85 year old female with a history of atrial fibrillation on Xarelto, hypertension, hyperlipidemia, CHF who presents with abnormal blood work.  She has had some worsening of her lower leg edema and went to her PCP yesterday.  She had some routine blood work and was found to have a hemoglobin of 7.8.  She had a recheck this morning that was 7.5.  She was advised to come to the emergency room.  She does not really feel worse than she normally does other than she has some increased leg swelling.  She has a little bit more fatigue but no shortness of breath.  No dizziness.  No chest pain.  No recent illnesses.  She has not visualized any blood in her stool or melena.  She recently moved from [place] and states that she did see a hematologist one point several years ago for low hemoglobin levels but this resolved with oral iron supplementation.  She stopped her Xarelto yesterday.     Past Medical History:  Diagnosis Date  . Atrial fibrillation (HCC)   . Hyperlipidemia   . Hypertension   . Hypothyroidism   . Mitral regurgitation   . MVP (mitral valve prolapse)     Patient Active Problem List   Diagnosis Date Noted  . Symptomatic anemia 10/22/2018  . B12 deficiency 05/27/2018  . Traumatic hematoma of right knee 05/25/2018  . Dependent edema 05/25/2018  . Cellulitis 05/22/2018  . GERD (gastroesophageal reflux disease) 05/20/2018  . Pulmonary hypertension, primary (HCC) 05/20/2018  . Hyperlipidemia 05/20/2018  . CHF (congestive heart failure) (HCC) 05/20/2018  . Tricuspid regurgitation 05/17/2018  . Mitral regurgitation 05/17/2018  . Chronic a-fib 05/17/2018  . Essential hypertension 05/17/2018  . Hypothyroid 05/17/2018  .  CAD (coronary artery disease) 05/17/2018  . Carotid atherosclerosis, bilateral 05/17/2018    Past Surgical History:  Procedure Laterality Date  . APPENDECTOMY    . c section    . CESAREAN SECTION    . CHOLECYSTECTOMY       OB History   No obstetric history on file.      Home Medications    Prior to Admission medications   Medication Sig Start Date End Date Taking? Authorizing Provider  diltiazem (CARDIZEM SR) 120 MG 12 hr capsule Take 1 capsule (120 mg total) by mouth 2 (two) times daily. 05/17/18  Yes [name], MD  ferrous gluconate (FERGON) 324 MG tablet Take 1 tablet (324 mg total) by mouth 3 (three) times daily with meals. 10/21/18  Yes [name], MD  furosemide (LASIX) 20 MG tablet Take 0.5 tablets (10 mg total) by mouth daily as needed. 05/25/18  Yes Judi Saa, DO  levothyroxine (SYNTHROID, LEVOTHROID) 75 MCG tablet Take 1 tablet (75 mcg total) by mouth daily before breakfast. 05/17/18  Yes [name], MD  acetaminophen (TYLENOL) 500 MG tablet Take 2 tablets (1,000 mg total) by mouth every 8 (eight) hours as needed for moderate pain. Patient not taking: Reported on 10/22/2018 05/13/18   Linus Mako B, NP  clindamycin (CLEOCIN) 300 MG capsule Take 1 capsule (300 mg total) by mouth 3 (three) times daily. Patient not taking: Reported on 10/22/2018 05/24/18   Leroy Sea, MD  diclofenac  sodium (VOLTAREN) 1 % GEL Apply 4 g topically 4 (four) times daily. Patient not taking: Reported on 10/22/2018 05/17/18   Orland MustardWolfe, Allison, MD  fluticasone [facility](FLONASE) 50 MCG/ACT nasal spray Place 2 sprays into both nostrils daily. Patient not taking: Reported on 10/22/2018 10/21/18   Orland MustardWolfe, Allison, MD  pravastatin (PRAVACHOL) 10 MG tablet Take 1 tablet (10 mg total) by mouth daily. Patient not taking: Reported on 10/22/2018 05/17/18   Orland MustardWolfe, Allison, MD  rivaroxaban (XARELTO) 20 MG TABS tablet Take 20 mg by mouth daily with supper.    [provider]    Family  History Family History  Problem Relation Age of Onset  . Cancer Mother   . Dementia Mother     Social History Social History   Tobacco Use  . Smoking status: Former Games developermoker  . Smokeless tobacco: Never Used  Substance Use Topics  . Alcohol use: Not Currently  . Drug use: Never     Allergies   Codeine; Penicillins; Vancomycin; Zithromax [azithromycin]; and Latex   Review of Systems Review of Systems  Constitutional: Positive for fatigue. Negative for chills, diaphoresis and fever.  HENT: Negative for congestion, rhinorrhea and sneezing.   Eyes: Negative.   Respiratory: Negative for cough, chest tightness and shortness of breath.   Cardiovascular: Positive for leg swelling. Negative for chest pain.  Gastrointestinal: Negative for abdominal pain, blood in stool, diarrhea, nausea and vomiting.  Genitourinary: Negative for difficulty urinating, flank pain, frequency and hematuria.  Musculoskeletal: Negative for arthralgias and back pain.  Skin: Negative for rash.  Neurological: Negative for dizziness, speech difficulty, weakness, numbness and headaches.     Physical Exam Updated Vital Signs BP (!) 119/57   Pulse 90   Temp 98 F (36.7 C) (Oral)   Resp 18   LMP  (LMP Unknown)   SpO2 100%   Physical Exam Constitutional:      Appearance: She is well-developed.  HENT:     Head: Normocephalic and atraumatic.  Eyes:     Pupils: Pupils are equal, round, and reactive to light.  Neck:     Musculoskeletal: Normal range of motion and neck supple.  Cardiovascular:     Rate and Rhythm: Normal rate and regular rhythm.     Heart sounds: Normal heart sounds.  Pulmonary:     Effort: Pulmonary effort is normal. No respiratory distress.     Breath sounds: Normal breath sounds. No wheezing or rales.  Chest:     Chest wall: No tenderness.  Abdominal:     General: Bowel sounds are normal.     Palpations: Abdomen is soft.     Tenderness: There is no abdominal tenderness. There is  no guarding or rebound.  Musculoskeletal: Normal range of motion.     Right lower leg: Edema present.     Left lower leg: Edema present.  Lymphadenopathy:     Cervical: No cervical adenopathy.  Skin:    General: Skin is warm and dry.     Findings: No rash.  Neurological:     Mental Status: She is alert and oriented to person, place, and time.      ED Treatments / Results  Labs (all labs ordered are listed, but only abnormal results are displayed) Labs Reviewed  COMPREHENSIVE METABOLIC PANEL - Abnormal; Notable for the following components:      Result Value   Glucose, Bld 125 (*)    All other components within normal limits  CBC - Abnormal; Notable for the following components:  RBC 2.84 (*)    Hemoglobin 7.9 (*)    HCT 25.5 (*)    All other components within normal limits  TYPE AND SCREEN  ABO/RH  PREPARE RBC (CROSSMATCH)    EKG None  Radiology No results found.  Procedures Procedures (including critical care time)  Medications Ordered in ED Medications  0.9 %  sodium chloride infusion (has no administration in time range)     Initial Impression / Assessment and Plan / ED Course  I have reviewed the triage vital signs and the nursing notes.  Pertinent labs & imaging results that were available during my care of the patient were reviewed by me and considered in my medical decision making (see chart for details).     Patient is a 85 year old female who presents with anemia.  Her baseline hemoglobin appears to be between 10 and 11.  This is a significant drop.  She did have a Hemoccult in the office today that was negative.  I spoke with Dr. Ophelia Charter with the hospitalist service who will admit the patient for transfusion and observation.  Of note, an EKG was ordered but she did not get this prior to transfer to the floor.  CRITICAL CARE Performed by: Rolan Bucco Total critical care time: 45 minutes Critical care time was exclusive of separately billable  procedures and treating other patients. Critical care was necessary to treat or prevent imminent or life-threatening deterioration. Critical care was time spent personally by me on the following activities: development of treatment plan with patient and/or surrogate as well as nursing, discussions with consultants, evaluation of patient's response to treatment, examination of patient, obtaining history from patient or surrogate, ordering and performing treatments and interventions, ordering and review of laboratory studies, ordering and review of radiographic studies, pulse oximetry and re-evaluation of patient's condition.   Final Clinical Impressions(s) / ED Diagnoses   Final diagnoses:  Symptomatic anemia    ED Discharge Orders    None       Rolan Bucco, MD 10/22/18 1718

## 2018-10-22 NOTE — Telephone Encounter (Signed)
CRITICAL VALUE STICKER  CRITICAL VALUE:Hgb 7.5  Hct 23.3  RECEIVER (on-site recipient of call): London Sheer, RT(R)  DATE & TIME NOTIFIED: 10/22/2018 @ 10:12 am  MESSENGER (representative from lab): Hope   MD NOTIFIED: Lanney Gins   TIME OF NOTIFICATION: 10:15 am   RESPONSE:

## 2018-10-22 NOTE — Addendum Note (Signed)
Addended by: London Sheer T on: 10/22/2018 10:33 AM   Modules accepted: Orders

## 2018-10-22 NOTE — Telephone Encounter (Signed)
Can not get patient in with heme for >1-2 weeks. Called daughter and discussed I advised sending to ER at this point. Concern if she drops over weekend. If she doesn't want anything done and is bleeding pallative care should be considered/discussed and they can figure out things further in the hospital. She voiced understanding and they will be taking her to the hospital.   [name], MD  [facility] [facility]

## 2018-10-22 NOTE — Addendum Note (Signed)
Addended by: London Sheer T on: 10/22/2018 10:07 AM   Modules accepted: Orders

## 2018-10-22 NOTE — Addendum Note (Signed)
Addended by: London Sheer T on: 10/22/2018 08:23 AM   Modules accepted: Orders

## 2018-10-23 ENCOUNTER — Observation Stay (HOSPITAL_BASED_OUTPATIENT_CLINIC_OR_DEPARTMENT_OTHER): Payer: Medicare Other

## 2018-10-23 DIAGNOSIS — D649 Anemia, unspecified: Secondary | ICD-10-CM | POA: Diagnosis not present

## 2018-10-23 DIAGNOSIS — R609 Edema, unspecified: Secondary | ICD-10-CM

## 2018-10-23 LAB — RETICULOCYTES
Immature Retic Fract: 20.7 % — ABNORMAL HIGH (ref 2.3–15.9)
RBC.: 3.52 MIL/uL — ABNORMAL LOW (ref 3.87–5.11)
Retic Count, Absolute: 69.9 10*3/uL (ref 19.0–186.0)
Retic Ct Pct: 2 % (ref 0.4–3.1)

## 2018-10-23 LAB — CBC
HCT: 30 % — ABNORMAL LOW (ref 36.0–46.0)
Hemoglobin: 9.4 g/dL — ABNORMAL LOW (ref 12.0–15.0)
MCH: 27 pg (ref 26.0–34.0)
MCHC: 31.3 g/dL (ref 30.0–36.0)
MCV: 86.2 fL (ref 80.0–100.0)
Platelets: 122 10*3/uL — ABNORMAL LOW (ref 150–400)
RBC: 3.48 MIL/uL — ABNORMAL LOW (ref 3.87–5.11)
RDW: 14.2 % (ref 11.5–15.5)
WBC: 4.2 10*3/uL (ref 4.0–10.5)
nRBC: 0 % (ref 0.0–0.2)

## 2018-10-23 LAB — BASIC METABOLIC PANEL
Anion gap: 11 (ref 5–15)
BUN: 11 mg/dL (ref 8–23)
CO2: 24 mmol/L (ref 22–32)
Calcium: 8.9 mg/dL (ref 8.9–10.3)
Chloride: 105 mmol/L (ref 98–111)
Creatinine, Ser: 0.68 mg/dL (ref 0.44–1.00)
GFR calc Af Amer: >60 mL/min (ref >60)
GFR calc non Af Amer: >60 mL/min (ref >60)
GLUCOSE: 88 mg/dL (ref 70–99)
Potassium: 3.4 mmol/L — ABNORMAL LOW (ref 3.5–5.1)
Sodium: 140 mmol/L (ref 135–145)

## 2018-10-23 LAB — IRON AND TIBC
Iron: 192 ug/dL — ABNORMAL HIGH (ref 28–170)
SATURATION RATIOS: 50 % — AB (ref 10.4–31.8)
TIBC: 388 ug/dL (ref 250–450)
UIBC: 196 ug/dL

## 2018-10-23 LAB — VITAMIN B12: Vitamin B-12: 722 pg/mL (ref 180–914)

## 2018-10-23 LAB — TYPE AND SCREEN
ABO/RH(D): A POS
Antibody Screen: NEGATIVE
Unit division: 0
Unit division: 0

## 2018-10-23 LAB — BPAM RBC
Blood Product Expiration Date: 202001212359
Blood Product Expiration Date: 202002072359
ISSUE DATE / TIME: 202001171809
ISSUE DATE / TIME: 202001172300
Unit Type and Rh: 600
Unit Type and Rh: 6200

## 2018-10-23 LAB — FERRITIN: Ferritin: 13 ng/mL (ref 11–307)

## 2018-10-23 LAB — FOLATE: Folate: 12.3 ng/mL (ref >5.9)

## 2018-10-23 MED ORDER — FUROSEMIDE 20 MG PO TABS: 20.0000 mg | ORAL_TABLET | Freq: Every day | ORAL | 3 refills | Status: DC

## 2018-10-23 MED ORDER — POTASSIUM CHLORIDE CRYS ER 20 MEQ PO TBCR
20.0000 meq | EXTENDED_RELEASE_TABLET | Freq: Once | ORAL | Status: AC
  Administered 2018-10-23: 20 meq via ORAL
  Filled 2018-10-23: qty 1

## 2018-10-23 MED ORDER — POTASSIUM CHLORIDE ER 10 MEQ PO TBCR: 10.0000 meq | EXTENDED_RELEASE_TABLET | Freq: Every day | ORAL | 0 refills | Status: DC

## 2018-10-23 NOTE — Discharge Instructions (Signed)
Wear the given TED stockings in the daytime in both legs.  You can remove these in the nighttime.    Follow with Primary MD [name], MD in 7 days   Get CBC, BMP  checked  by Primary MD  in 5-7 days   Activity: As tolerated with Full fall precautions use walker/cane & assistance as needed  Disposition Home   Diet: Heart Healthy     Special Instructions: If you have smoked or chewed Tobacco  in the last 2 yrs please stop smoking, stop any regular Alcohol  and or any Recreational drug use.  On your next visit with your primary care physician please Get Medicines reviewed and adjusted.  Please request your Prim.MD to go over all Hospital Tests and Procedure/Radiological results at the follow up, please get all Hospital records sent to your Prim MD by signing hospital release before you go home.  If you experience worsening of your admission symptoms, develop shortness of breath, life threatening emergency, suicidal or homicidal thoughts you must seek medical attention immediately by calling 911 or calling your MD immediately  if symptoms less severe.  You Must read complete instructions/literature along with all the possible adverse reactions/side effects for all the Medicines you take and that have been prescribed to you. Take any new Medicines after you have completely understood and accpet all the possible adverse reactions/side effects.

## 2018-10-23 NOTE — Progress Notes (Signed)
VASCULAR LAB PRELIMINARY  PRELIMINARY  PRELIMINARY  PRELIMINARY  Left lower extremity venous duplex completed.    Preliminary report:  See report in CV Proc  Adilen Pavelko, RVT 10/23/2018, 9:23 AM

## 2018-10-23 NOTE — Discharge Summary (Signed)
Grace Alvarado ZOX:[id number] DOB: [birth year] DOA: 10/22/2018  PCP: [name], MD  Admit date: 10/22/2018  Discharge date: 10/23/2018  Admitted From: Home  Disposition:  Home   Recommendations for Outpatient Follow-up:   Follow up with PCP in 1-2 weeks  PCP Please obtain BMP/CBC, 2 view CXR in 1week,  (see Discharge instructions)   PCP Please follow up on the following pending results: Kindly note her erythropoietin levels are pending.  Request PCP to follow.   Home Health: None   Equipment/Devices: None  Consultations: None Discharge Condition: Stable   CODE STATUS: Full   Diet Recommendation: Heart Healthy   Diet Order            Diet - low sodium heart healthy        Diet regular Room service appropriate? Yes; Fluid consistency: Thin  Diet effective now               Chief Complaint  Patient presents with  . Abnormal Lab     Brief history of present illness from the day of admission and additional interim summary    Grace Alvarado is a 85 y.o. female with medical history significant of hypothroidism; HTN; HLD; and afib presenting with low Hgb.  The doctor said her blood was way down and it was a big problem.  Her Hgb was 7.5 and so her PCP sent her in.                                                                 Hospital Course     Symptomatic anemia -  Patient with somewhat incidental finding of anemia by PCP, she was not clearly symptomatic, her hemoglobin was 7.9, she received 1 unit of packed RBC in ER and is currently above 9.  No history of black stools or blood in stool.  Hemoccult negative here.  She was on Xarelto which will be continued.  Iron studies were inconclusive and borderline and she is already on oral iron supplementation which will be continued.  At this time she is  asymptomatic from this standpoint.  Defer outpatient monitoring to PCP.  If anemia reoccurs outpatient GI follow-up along with hematology follow-up will be prudent.  Patient and daughter tell me that she had [place]oscop[place] and they were unremarkable.  Kindly note her erythropoietin levels are pending.  Request PCP to follow.    Lymphedema -Chronic left and right leg, ultrasound unremarkable and no DVT, continue TED stockings and daytime along with home dose Lasix, dose has been increased.  PCP to monitor BMP weight and Lasix dose.  Afib -She is rate controlled with Diltiazem, will continue, [place] vas 2 score is at least 4.  Resume Xarelto I do not think she is actively bleeding in any  significant manner.    HTN -Continue Diltiazem  HLD -It appears that the patient has stopped taking pravastatin -We will defer this to PCP and primary cardiologist  Hypothyroidism -Normal TSH and free T4 in 10/19 and normal TSH on 1/16 - will not repeat, continue home regimen.     Discharge diagnosis     Principal Problem:   Symptomatic anemia Active Problems:   Chronic a-fib   Essential hypertension   Hypothyroid   Hyperlipidemia   Lymphedema of left lower extremity    Discharge instructions    Discharge Instructions    Diet - low sodium heart healthy   Complete by:  As directed    Discharge instructions   Complete by:  As directed    Wear the given TED stockings in the daytime in both legs.  You can remove these in the nighttime.    Follow with Primary MD Orland MustardWolfe, Allison, MD in 7 days   Get CBC, BMP  checked  by Primary MD  in 5-7 days   Activity: As tolerated with Full fall precautions use walker/cane & assistance as needed  Disposition Home   Diet: Heart Healthy     Special Instructions: If you have smoked or chewed Tobacco  in the last 2 yrs please stop smoking, stop any regular Alcohol  and or any Recreational drug use.  On your next  visit with your primary care physician please Get Medicines reviewed and adjusted.  Please request your Prim.MD to go over all Hospital Tests and Procedure/Radiological results at the follow up, please get all Hospital records sent to your Prim MD by signing hospital release before you go home.  If you experience worsening of your admission symptoms, develop shortness of breath, life threatening emergency, suicidal or homicidal thoughts you must seek medical attention immediately by calling 911 or calling your MD immediately  if symptoms less severe.  You Must read complete instructions/literature along with all the possible adverse reactions/side effects for all the Medicines you take and that have been prescribed to you. Take any new Medicines after you have completely understood and accpet all the possible adverse reactions/side effects.   Increase activity slowly   Complete by:  As directed       Discharge Medications   Allergies as of 10/23/2018      Reactions   Codeine Other (See Comments)   unknown   Penicillins Other (See Comments)   unknown   Vancomycin Other (See Comments)   unknown   Zithromax [azithromycin] Other (See Comments)   unknown   Latex Rash      Medication List    TAKE these medications   diltiazem 120 MG 12 hr capsule Commonly known as:  CARDIZEM SR Take 1 capsule (120 mg total) by mouth 2 (two) times daily.   ferrous gluconate 324 MG tablet Commonly known as:  FERGON Take 1 tablet (324 mg total) by mouth 3 (three) times daily with meals.   furosemide 20 MG tablet Commonly known as:  LASIX Take 1 tablet (20 mg total) by mouth daily. What changed:    how much to take  when to take this  reasons to take this   levothyroxine 75 MCG tablet Commonly known as:  SYNTHROID, LEVOTHROID Take 1 tablet (75 mcg total) by mouth daily before breakfast.   potassium chloride 10 MEQ tablet Commonly known as:  K-DUR Take 1 tablet (10 mEq total) by mouth  daily.   rivaroxaban 20 MG Tabs tablet Commonly known as:  XARELTO Take 20 mg by mouth daily with supper.       Follow-up Information    [name], MD. Schedule an appointment as soon as possible for a visit in 1 week(s).   Specialty:  Family Medicine Contact information: [address] [place] [address] [phone]        Jeani Hawking, MD. Schedule an appointment as soon as possible for a visit in 1 week(s).   Specialty:  Gastroenterology Contact information: [address] [place] [address] [phone]           Major procedures and Radiology Reports - PLEASE review detailed and final reports thoroughly  -        Vas [place] Lower Extremity Venous (dvt)  Result Date: 10/23/2018  Lower Venous Study Indications: Edema.  Risk Factors: Lymphedema. Comparison Study: No prior study Performing Technologist: Sherren Kerns RVS  Examination Guidelines: A complete evaluation includes B-mode imaging, spectral Doppler, color Doppler, and power Doppler as needed of all accessible portions of each vessel. Bilateral testing is considered an integral part of a complete examination. Limited examinations for reoccurring indications may be performed as noted.  Right Venous Findings: +---+---------------+---------+-----------+----------+-------+    CompressibilityPhasicitySpontaneityPropertiesSummary +---+---------------+---------+-----------+----------+-------+ CFVFull           Yes      Yes                          +---+---------------+---------+-----------+----------+-------+ SFJFull                                                 +---+---------------+---------+-----------+----------+-------+  Left Venous Findings: +---------+---------------+---------+-----------+----------+-------+          CompressibilityPhasicitySpontaneityPropertiesSummary +---------+---------------+---------+-----------+----------+-------+ CFV      Full            Yes      Yes                          +---------+---------------+---------+-----------+----------+-------+ SFJ      Full                                                 +---------+---------------+---------+-----------+----------+-------+ FV Prox  Full                                                 +---------+---------------+---------+-----------+----------+-------+ FV Mid   Full                                                 +---------+---------------+---------+-----------+----------+-------+ FV DistalFull                                                 +---------+---------------+---------+-----------+----------+-------+ PFV      Full                                                 +---------+---------------+---------+-----------+----------+-------+  POP      Full           Yes      Yes                          +---------+---------------+---------+-----------+----------+-------+ PTV      Full                                                 +---------+---------------+---------+-----------+----------+-------+ PERO     Full                                                 +---------+---------------+---------+-----------+----------+-------+ GSV      Full                                                 +---------+---------------+---------+-----------+----------+-------+    Summary: Right: No evidence of common femoral vein obstruction. Left: There is no evidence of deep vein thrombosis in the lower extremity.There is no evidence of superficial venous thrombosis.  *See table(s) above for measurements and observations.    Preliminary     Micro Results     No results found for this or any previous visit (from the past 240 hour(s)).  Today   Subjective    Elodia Florence today has no headache,no chest abdominal pain,no new weakness tingling or numbness, feels much better wants to go home today.    Objective   Blood pressure 124/69, pulse 78,  temperature (!) 97.5 F (36.4 C), temperature source Oral, resp. rate 18, SpO2 93 %.   Intake/Output Summary (Last 24 hours) at 10/23/2018 1004 Last data filed at 10/23/2018 0900 Gross per 24 hour  Intake 1020 ml  Output -  Net 1020 ml    Exam Awake Alert, Oriented x 3, No new F.N deficits, Normal affect [place].AT,PERRAL Supple Neck,No JVD, No cervical lymphadenopathy appriciated.  Symmetrical Chest wall movement, Good air movement bilaterally, CTAB RRR,No Gallops,Rubs or new Murmurs, No Parasternal Heave +ve B.Sounds, Abd Soft, Non tender, No organomegaly appriciated, No rebound -guarding or rigidity. No Cyanosis, Clubbing or edema, No new Rash or bruise   Data Review   CBC w Diff:  Lab Results  Component Value Date   WBC 4.2 10/23/2018   HGB 9.4 (L) 10/23/2018   HCT 30.0 (L) 10/23/2018   PLT 122 (L) 10/23/2018   LYMPHOPCT 22.7 10/22/2018   MONOPCT 10.4 10/22/2018   EOSPCT 0.9 10/22/2018   BASOPCT 0.4 10/22/2018    CMP:  Lab Results  Component Value Date   NA 140 10/23/2018   NA 141 06/10/2018   K 3.4 (L) 10/23/2018   CL 105 10/23/2018   CO2 24 10/23/2018   BUN 11 10/23/2018   BUN 19 06/10/2018   CREATININE 0.68 10/23/2018   GLU 89 04/09/2018   PROT 6.8 10/22/2018   ALBUMIN 3.8 10/22/2018   BILITOT 0.7 10/22/2018   ALKPHOS 39 10/22/2018   AST 23 10/22/2018   ALT 18 10/22/2018  .   Total Time in preparing paper work, data evaluation and todays exam -  35 minutes  Susa RaringPrashant  M.D on 10/23/2018 at 10:04 AM  [facility]   Office  [phone]

## 2018-10-24 LAB — ERYTHROPOIETIN: Erythropoietin: 153.9 m[IU]/mL — ABNORMAL HIGH (ref 2.6–18.5)

## 2018-10-25 ENCOUNTER — Ambulatory Visit: Payer: Medicare Other | Admitting: Family Medicine

## 2018-10-25 ENCOUNTER — Encounter: Payer: Self-pay | Admitting: Family Medicine

## 2018-10-25 VITALS — BP 126/66 | HR 88 | Temp 97.6°F | Ht 63.0 in | Wt 147.2 lb

## 2018-10-25 DIAGNOSIS — D508 Other iron deficiency anemias: Secondary | ICD-10-CM | POA: Diagnosis not present

## 2018-10-25 DIAGNOSIS — M7989 Other specified soft tissue disorders: Secondary | ICD-10-CM

## 2018-10-25 LAB — PATHOLOGIST SMEAR REVIEW

## 2018-10-25 LAB — CBC WITH DIFFERENTIAL/PLATELET
BASOS ABS: 0 10*3/uL (ref 0.0–0.1)
Basophils Relative: 0.9 % (ref 0.0–3.0)
Eosinophils Absolute: 0 10*3/uL (ref 0.0–0.7)
Eosinophils Relative: 1 % (ref 0.0–5.0)
HCT: 35.3 % — ABNORMAL LOW (ref 36.0–46.0)
Hemoglobin: 11.5 g/dL — ABNORMAL LOW (ref 12.0–15.0)
Lymphocytes Relative: 22 % (ref 12.0–46.0)
Lymphs Abs: 1.1 10*3/uL (ref 0.7–4.0)
MCHC: 32.5 g/dL (ref 30.0–36.0)
MCV: 86.6 fl (ref 78.0–100.0)
Monocytes Absolute: 0.6 10*3/uL (ref 0.1–1.0)
Monocytes Relative: 11.6 % (ref 3.0–12.0)
NEUTROS PCT: 64.5 % (ref 43.0–77.0)
Neutro Abs: 3.2 10*3/uL (ref 1.4–7.7)
PLATELETS: 167 10*3/uL (ref 150.0–400.0)
RBC: 4.07 Mil/uL (ref 3.87–5.11)
RDW: 16 % — ABNORMAL HIGH (ref 11.5–15.5)
WBC: 5 10*3/uL (ref 4.0–10.5)

## 2018-10-25 LAB — BASIC METABOLIC PANEL
BUN: 12 mg/dL (ref 6–23)
CO2: 30 mEq/L (ref 19–32)
Calcium: 10.2 mg/dL (ref 8.4–10.5)
Chloride: 100 mEq/L (ref 96–112)
Creatinine, Ser: 0.77 mg/dL (ref 0.40–1.20)
GFR: 70.22 mL/min (ref >60.00)
Glucose, Bld: 91 mg/dL (ref 70–99)
Potassium: 3.7 mEq/L (ref 3.5–5.1)
SODIUM: 137 meq/L (ref 135–145)

## 2018-10-25 NOTE — Patient Instructions (Signed)
Referring to hematology done. Please let me know if you don't hear from them.   -if constipated, can take over the counter stool softener called colace.   -start back pravastatin at night. It is reasonable with her age to discuss with Dr. Jens Som about coming off, but would discuss with him.   -continue with xarelto.   -lasix 40mg  x 2-3 days then back down to 20mg .   -continue with iron 2-3x/day.

## 2018-10-25 NOTE — Progress Notes (Signed)
Patient: Grace Alvarado MRN: [id number] DOB: 1925/07/02 PCP: [name], MD     Subjective:  Chief Complaint  Patient presents with  . Hospitalization Follow-up    HPI: The patient is a 85 y.o. female who presents today for hospital follow up for anemia.  Admit date: 10/22/2018 Discharge date: 10/23/2018 I found hertot be anemic on office visit last week and after her hgb dropped down to 7.5 we sent to ER since the weekend was coming up. I also stopped her xaralto. She was admitted for anemia even though not symptomatic. Her hgb was 7.9 in ER and she received 1 unite of PRBC in ER and repeat CBC showed her hgb to be over 9.0 after this. Hemoccult was negative in office and at hospital. I did iron studies, as did the hospital and were found to be in conclusive. I started her on oral iron supplementation, when they have continued. EPO levels were also checked and needed to be followed up today. When I discussed findings with patient last week, she declined a GI work up, but was okay for heme referral and I placed this last week.   kdur added daily. .   Lower leg edema. DVT was ruled out with ultrasound. No findings of this. Continued her lasix dose which they supposedly increaesed (dosage still says 20mg  on her discharge summary) and recommended TED stocking. Will need to recheck bmp today.   Review of Systems  Constitutional: Negative for chills, fatigue and fever.  Respiratory: Positive for cough. Negative for shortness of breath and wheezing.   Cardiovascular: Positive for leg swelling. Negative for chest pain and palpitations.  Gastrointestinal: Positive for abdominal pain (with iron only). Negative for blood in stool, diarrhea, nausea and vomiting.    Allergies Patient is allergic to codeine; penicillins; vancomycin; zithromax [azithromycin]; and latex.  Past Medical History Patient  has a past medical history of Atrial fibrillation (HCC), Hyperlipidemia, Hypertension,  Hypothyroidism, Mitral regurgitation, and MVP (mitral valve prolapse).  Surgical History Patient  has a past surgical history that includes Appendectomy; Cholecystectomy; c section; and Cesarean section.  Family History Pateint's family history includes Cancer in her mother; Dementia in her mother.  Social History Patient  reports that she has quit smoking. She has never used smokeless tobacco. She reports previous alcohol use. She reports that she does not use drugs.    Objective: Vitals:   10/25/18 0931  BP: 126/66  Pulse: 88  Temp: 97.6 F (36.4 C)  TempSrc: Oral  SpO2: 99%  Weight: 147 lb 4 oz (66.8 kg)  Height: 5\' 3"  (1.6 m)    Body mass index is 26.08 kg/m.  Physical Exam Vitals signs reviewed.  Neck:     Musculoskeletal: Normal range of motion and neck supple.  Cardiovascular:     Rate and Rhythm: Normal rate. Rhythm irregular.     Heart sounds: No murmur.  Pulmonary:     Effort: Pulmonary effort is normal. No respiratory distress.     Breath sounds: Normal breath sounds. No wheezing or rales.  Abdominal:     General: Abdomen is flat. Bowel sounds are normal.     Palpations: Abdomen is soft.  Neurological:     General: No focal deficit present.     Mental Status: She is alert and oriented to person, place, and time.        Assessment/plan: 1. Other iron deficiency anemia Hospital work does not believe she is bleeding and restarted her xaralto. CHADS2 score of 3. hgb  now back up at 11.4. continues to be asymptomatic. Declines referral to GI as she does not want any invasive procedures done. My peripheral smear is still pending to rule out MDS. EPO was elevated. Continue oral iron. Can not really tolerate tid, so will aim for BID. Heme referral placed last week and will f/u with them. precautions given.  - CBC with Differential/Platelet - Basic metabolic panel  2. Leg swelling Continue with elevation, TED compression and lasix. Bmp today shows potassium  normal. Will continue with dosage. Hospital thinks lymphedema, but I wonder if more venous stasis. Improved from hospital stay.   3. Can restart her statin.  -not sure why it fell off her list. Admitting hospitalist thought she had stopped on her own, but patient and family state they took it off her list and she thought she was supposed to stop it. Did discuss she is 44 and reasonable option to stop this, but would discuss at follow up with cardiology. Resume for now.     Return if symptoms worsen or fail to improve.     [name], MD  [facility]  10/25/2018

## 2018-10-25 NOTE — Telephone Encounter (Signed)
Opened in error

## 2018-10-29 ENCOUNTER — Encounter: Payer: Self-pay | Admitting: Cardiology

## 2018-10-29 ENCOUNTER — Ambulatory Visit: Payer: Medicare Other | Admitting: Cardiology

## 2018-10-29 VITALS — BP 118/60 | HR 70 | Ht 63.0 in | Wt 142.7 lb

## 2018-10-29 DIAGNOSIS — R609 Edema, unspecified: Secondary | ICD-10-CM | POA: Diagnosis not present

## 2018-10-29 DIAGNOSIS — I340 Nonrheumatic mitral (valve) insufficiency: Secondary | ICD-10-CM | POA: Diagnosis not present

## 2018-10-29 DIAGNOSIS — I4821 Permanent atrial fibrillation: Secondary | ICD-10-CM

## 2018-10-29 DIAGNOSIS — I341 Nonrheumatic mitral (valve) prolapse: Secondary | ICD-10-CM

## 2018-10-29 DIAGNOSIS — I10 Essential (primary) hypertension: Secondary | ICD-10-CM

## 2018-10-29 MED ORDER — FUROSEMIDE 20 MG PO TABS: 40.0000 mg | ORAL_TABLET | Freq: Every day | ORAL | 3 refills | Status: DC

## 2018-10-29 NOTE — Patient Instructions (Signed)
Medication Instructions:  INCREASE FUROSEMIDE TO 40 MG ONCE DAILY= 2 OF THE 20 MG TABLETS ONCE DAILY If you need a refill on your cardiac medications before your next appointment, please call your pharmacy.   Lab work: Your physician recommends that you return for lab work in: ONE WEEK If you have labs (blood work) drawn today and your tests are completely normal, you will receive your results only by: Marland Kitchen MyChart Message (if you have MyChart) OR . A paper copy in the mail If you have any lab test that is abnormal or we need to change your treatment, we will call you to review the results.  Testing/Procedures: Your physician has requested that you have an echocardiogram. Echocardiography is a painless test that uses sound waves to create images of your heart. It provides your doctor with information about the size and shape of your heart and how well your heart's chambers and valves are working. This procedure takes approximately one hour. There are no restrictions for this procedure.  [address]  Follow-Up: At [facility], you and your health needs are our priority.  As part of our continuing mission to provide you with exceptional heart care, we have created designated Provider Care Teams.  These Care Teams include your primary Cardiologist (physician) and Advanced Practice Providers (APPs -  Physician Assistants and Nurse Practitioners) who all work together to provide you with the care you need, when you need it. You will need a follow up appointment in 6 months.  Please call our office 2 months in advance to schedule this appointment.  You may see Olga Millers MD or one of the following Advanced Practice Providers on your designated Care Team:   Corine Shelter, PA-C Judy Pimple, [place] . Marjie Skiff, PA-C   CALL IN MAY TO SCHEDULE APPOINTMENT IN [place]

## 2018-10-29 NOTE — Progress Notes (Signed)
HPI: FU atrial fibrillation. [place]  Patient with history of permanent atrial fibrillation.  Previously failed amiodarone and multaq per records. Nuclear study 2012 showed no ischemia with normal LV function.  Last echo 2/19 showed ejection fraction 54% with mild global hypokinesis.  Severe biatrial enlargement.  Trace aortic insufficiency, mitral valve prolapse, severe mitral regurgitation and moderate tricuspid regurgitation.  Patient apparently made it clear previously that she did not want valve surgery.  She was admitted in August 2019 after a mechanical fall.  She had a hematoma in her right knee.  There is also cellulitis which was treated.  She was noted to be hyponatremic as well.  TSH in August 2019 6.06.  Free T4 0.95.  Recently with increased lower extremity edema.  Lower extremity venous Dopplers January 2020 showed no DVT.  Since last seen she denies dyspnea, chest pain, palpitations or syncope.  She has had increased edema left lower extremity.  Current Outpatient Medications  Medication Sig Dispense Refill  . diltiazem (CARDIZEM SR) 120 MG 12 hr capsule Take 1 capsule (120 mg total) by mouth 2 (two) times daily. 180 capsule 1  . ferrous gluconate (FERGON) 324 MG tablet Take 1 tablet (324 mg total) by mouth 3 (three) times daily with meals. 90 tablet 0  . furosemide (LASIX) 20 MG tablet Take 1 tablet (20 mg total) by mouth daily. 30 tablet 3  . levothyroxine (SYNTHROID, LEVOTHROID) 75 MCG tablet Take 1 tablet (75 mcg total) by mouth daily before breakfast. 90 tablet 3  . potassium chloride (K-DUR) 10 MEQ tablet Take 1 tablet (10 mEq total) by mouth daily. 30 tablet 0  . pravastatin (PRAVACHOL) 10 MG tablet Take 10 mg by mouth daily.  90 tablet 3  . rivaroxaban (XARELTO) 20 MG TABS tablet Take 20 mg by mouth daily with supper.     No current facility-administered medications for this visit.      Past Medical History:  Diagnosis Date  . Atrial  fibrillation (HCC)   . Hyperlipidemia   . Hypertension   . Hypothyroidism   . Mitral regurgitation   . MVP (mitral valve prolapse)     Past Surgical History:  Procedure Laterality Date  . APPENDECTOMY    . c section    . CESAREAN SECTION    . CHOLECYSTECTOMY      Social History   Socioeconomic History  . Marital status: Widowed    Spouse name: Not on file  . Number of children: 3  . Years of education: Not on file  . Highest education level: Not on file  Occupational History  . Occupation: retired  Engineer, productionocial Needs  . Financial resource strain: Not on file  . Food insecurity:    Worry: Not on file    Inability: Not on file  . Transportation needs:    Medical: Not on file    Non-medical: Not on file  Tobacco Use  . Smoking status: Former Games developermoker  . Smokeless tobacco: Never Used  Substance and Sexual Activity  . Alcohol use: Not Currently  . Drug use: Never  . Sexual activity: Not Currently  Lifestyle  . Physical activity:    Days per week: Not on file    Minutes per session: Not on file  . Stress: Not on file  Relationships  . Social connections:    Talks on phone: Not on file    Gets together: Not on file    Attends religious service:  Not on file    Active member of club or organization: Not on file    Attends meetings of clubs or organizations: Not on file    Relationship status: Not on file  . Intimate partner violence:    Fear of current or ex partner: Not on file    Emotionally abused: Not on file    Physically abused: Not on file    Forced sexual activity: Not on file  Other Topics Concern  . Not on file  Social History Narrative  . Not on file    Family History  Problem Relation Age of Onset  . Cancer Mother   . Dementia Mother     ROS: no fevers or chills, productive cough, hemoptysis, dysphasia, odynophagia, melena, hematochezia, dysuria, hematuria, rash, seizure activity, orthopnea, PND, claudication. Remaining systems are  negative.  Physical Exam: Well-developed well-nourished in no acute distress.  Skin is warm and dry.  HEENT is normal.  Neck is supple.  Chest is clear to auscultation with normal expansion.  Cardiovascular exam is irregular Abdominal exam nontender or distended. No masses palpated. Extremities show 1+ edema LLE. neuro grossly intact  A/P  1 permanent atrial fibrillation-plan to continue Cardizem for rate control.  Continue Xarelto.  She is scheduled to see hematology for anemia.  If she is felt to have problems with chronic blood loss we could discontinue Xarelto in the future with the understanding that her risk of stroke would be higher.  2 mitral valve prolapse-previous echocardiogram showed severe mitral regurgitation.  Given her age we preferred conservative management without procedures.  She remains in agreement.  We will plan to repeat echocardiogram to reassess LV function.  3 hypertension-patient's blood pressure is controlled.  Continue present medications and follow.  4 lower extremity edema-possibly secondary to venous insufficiency.  No DVT on recent venous Dopplers.  I have recommended keeping her foot elevated and compression hose.  I will also increase Lasix to 40 mg daily.  Check potassium and renal function in 1 week.  5 hyperlipidemia-continue statin.  Olga MillersBrian Maxwel Meadowcroft, MD

## 2018-11-01 NOTE — Progress Notes (Signed)
Subjective:    Patient ID: Grace Alvarado, female    DOB: 02-01-1925, 85 y.o.   MRN: [id number]  HPI The patient is here for follow up.  Permanent Afib, severe MR, mod TR, hypertension; she is following with Dr Jens Som.  She is on xeralto and cardizem.  She denies chest pain, palpitations and shortness of breath.  She denies lightheadedness.  Iron def anemia:  She is taking oral iron twice daily.  She has been referred to heme.  She is on xeralto.  She denies blood in her stool.  She denies black stool.  Leg swelling:  Possibly related to venous insufficiency.  She wears compression socks but not daily, takes lasix and elevates her legs.  On 1/20 she had increased edema and her previous PCP advised lasix 40 mg 2-3 days and then back to 20 mg daily.  Cardiology increased her lasix to 40 mg daily on 1/24.  She states the swelling is improving.  She has repeat blood work the end of this week.  Hyperlipidemia: She is taking her medication daily. She is compliant with a low fat/cholesterol diet. She is not exercising regularly. She denies myalgias.   Hypothyroidism:  She is taking her medication daily.  She denies any recent changes in energy or weight that are unexplained.   Neuropathy in feet and lower legs:  Her feet are numb.  She occasionally gets a cramp, which are very painful-these are especially in her left foot.  She denies pain outside of the cramping.  She is not sure what caused the neuropathy.  It did get better with B12 replacement.  She does have chronic back issues, but is unsure if her back is the cause of the neuropathy.  B12 def:  She gets B12 injections monthly.    Medications and allergies reviewed with patient and updated if appropriate.  Patient Active Problem List   Diagnosis Date Noted  . Iron deficiency anemia 11/02/2018  . Symptomatic anemia 10/22/2018  . Lymphedema of left lower extremity 10/22/2018  . B12 deficiency 05/27/2018  . Traumatic hematoma of right  knee 05/25/2018  . Dependent edema 05/25/2018  . Cellulitis 05/22/2018  . GERD (gastroesophageal reflux disease) 05/20/2018  . Pulmonary hypertension, primary (HCC) 05/20/2018  . Hyperlipidemia 05/20/2018  . CHF (congestive heart failure) (HCC) 05/20/2018  . Tricuspid regurgitation 05/17/2018  . Mitral regurgitation 05/17/2018  . Chronic a-fib 05/17/2018  . Essential hypertension 05/17/2018  . Hypothyroid 05/17/2018  . CAD (coronary artery disease) 05/17/2018  . Carotid atherosclerosis, bilateral 05/17/2018    Current Outpatient Medications on File Prior to Visit  Medication Sig Dispense Refill  . diltiazem (CARDIZEM SR) 120 MG 12 hr capsule Take 1 capsule (120 mg total) by mouth 2 (two) times daily. 180 capsule 1  . ferrous gluconate (FERGON) 324 MG tablet Take 1 tablet (324 mg total) by mouth 3 (three) times daily with meals. 90 tablet 0  . furosemide (LASIX) 20 MG tablet Take 2 tablets (40 mg total) by mouth daily. 180 tablet 3  . levothyroxine (SYNTHROID, LEVOTHROID) 75 MCG tablet Take 1 tablet (75 mcg total) by mouth daily before breakfast. 90 tablet 3  . potassium chloride (K-DUR) 10 MEQ tablet Take 1 tablet (10 mEq total) by mouth daily. 30 tablet 0  . pravastatin (PRAVACHOL) 10 MG tablet Take 10 mg by mouth daily.  90 tablet 3  . rivaroxaban (XARELTO) 20 MG TABS tablet Take 20 mg by mouth daily with supper.     No  current facility-administered medications on file prior to visit.     Past Medical History:  Diagnosis Date  . Atrial fibrillation (HCC)   . Hyperlipidemia   . Hypertension   . Hypothyroidism   . Mitral regurgitation   . MVP (mitral valve prolapse)     Past Surgical History:  Procedure Laterality Date  . APPENDECTOMY    . c section    . CESAREAN SECTION    . CHOLECYSTECTOMY      Social History   Socioeconomic History  . Marital status: Widowed    Spouse name: Not on file  . Number of children: 3  . Years of education: Not on file  . Highest  education level: Not on file  Occupational History  . Occupation: retired  Engineer, production  . Financial resource strain: Not on file  . Food insecurity:    Worry: Not on file    Inability: Not on file  . Transportation needs:    Medical: Not on file    Non-medical: Not on file  Tobacco Use  . Smoking status: Former Games developer  . Smokeless tobacco: Never Used  Substance and Sexual Activity  . Alcohol use: Not Currently  . Drug use: Never  . Sexual activity: Not Currently  Lifestyle  . Physical activity:    Days per week: Not on file    Minutes per session: Not on file  . Stress: Not on file  Relationships  . Social connections:    Talks on phone: Not on file    Gets together: Not on file    Attends religious service: Not on file    Active member of club or organization: Not on file    Attends meetings of clubs or organizations: Not on file    Relationship status: Not on file  Other Topics Concern  . Not on file  Social History Narrative  . Not on file    Family History  Problem Relation Age of Onset  . Cancer Mother   . Dementia Mother     Review of Systems  Constitutional: Negative for chills and fever.  HENT: Positive for sneezing.   Respiratory: Negative for cough, shortness of breath and wheezing.   Cardiovascular: Positive for leg swelling. Negative for chest pain and palpitations.  Gastrointestinal: Negative for abdominal pain, blood in stool (no black stool) and constipation.       GERD occ  Neurological: Positive for headaches (rare). Negative for dizziness and light-headedness.       Objective:   Vitals:   11/02/18 1318  BP: (!) 146/60  Pulse: 93  Resp: 16  Temp: 98.8 F (37.1 C)  SpO2: 98%   BP Readings from Last 3 Encounters:  11/02/18 (!) 146/60  10/29/18 118/60  10/25/18 126/66   Wt Readings from Last 3 Encounters:  11/02/18 145 lb (65.8 kg)  10/29/18 142 lb 11.2 oz (64.7 kg)  10/25/18 147 lb 4 oz (66.8 kg)   Body mass index is 25.69  kg/m.   Physical Exam    Constitutional: Appears well-developed and well-nourished. No distress.  HENT:  Head: Normocephalic and atraumatic.  Neck: Neck supple. No tracheal deviation present. No thyromegaly present.  No cervical lymphadenopathy Cardiovascular: Normal rate, irregular rhythm and normal heart sounds.   2/6 systolic murmur heard. No carotid bruit .  Mild RLE edema, 1 + LLE pitting edema - improved per Pt Pulmonary/Chest: Effort normal and breath sounds normal. No respiratory distress. No has no wheezes. No rales.  Skin:  Skin is warm and dry. Not diaphoretic.  Psychiatric: Normal mood and affect. Behavior is normal.      Assessment & Plan:    See Problem List for Assessment and Plan of chronic medical problems.

## 2018-11-02 ENCOUNTER — Encounter: Payer: Self-pay | Admitting: Internal Medicine

## 2018-11-02 ENCOUNTER — Ambulatory Visit: Payer: Medicare Other | Admitting: Internal Medicine

## 2018-11-02 VITALS — BP 146/60 | HR 93 | Temp 98.8°F | Resp 16 | Ht 63.0 in | Wt 145.0 lb

## 2018-11-02 DIAGNOSIS — I482 Chronic atrial fibrillation, unspecified: Secondary | ICD-10-CM | POA: Diagnosis not present

## 2018-11-02 DIAGNOSIS — I10 Essential (primary) hypertension: Secondary | ICD-10-CM | POA: Diagnosis not present

## 2018-11-02 DIAGNOSIS — I340 Nonrheumatic mitral (valve) insufficiency: Secondary | ICD-10-CM

## 2018-11-02 DIAGNOSIS — R609 Edema, unspecified: Secondary | ICD-10-CM

## 2018-11-02 DIAGNOSIS — E782 Mixed hyperlipidemia: Secondary | ICD-10-CM | POA: Diagnosis not present

## 2018-11-02 DIAGNOSIS — E538 Deficiency of other specified B group vitamins: Secondary | ICD-10-CM

## 2018-11-02 DIAGNOSIS — D509 Iron deficiency anemia, unspecified: Secondary | ICD-10-CM | POA: Insufficient documentation

## 2018-11-02 DIAGNOSIS — E038 Other specified hypothyroidism: Secondary | ICD-10-CM

## 2018-11-02 NOTE — Assessment & Plan Note (Signed)
Chronic atrial fibrillation Asymptomatic Following with Dr. Jens Som Continue Xarelto, diltiazem As Dr. Jens Som mentioned to her may need to discontinue Xarelto if she does have GI bleeding

## 2018-11-02 NOTE — Assessment & Plan Note (Signed)
Has been getting monthly injections-next due 11/09/2018 Continue monthly injections

## 2018-11-02 NOTE — Assessment & Plan Note (Signed)
Hemoglobin improved-blood transfusion 10/22/2018 Taking iron twice daily without side effects Was referred to hematology, but it looks like this was canceled because she went to the hospital-we will re--refer No symptoms consistent with GI bleed On Xarelto She would like to hold off on GI referral-does not want any invasive procedures We will see what hematology thinks and go from there Repeat CBC this week or next

## 2018-11-02 NOTE — Assessment & Plan Note (Signed)
Moderate-severe Asymptomatic Cardiology monitoring

## 2018-11-02 NOTE — Assessment & Plan Note (Signed)
Edema left lower extremity >> RLE  Improved with Lasix 40 mg daily Repeat BMP per Dr. Jens Som this week Stressed compression socks on a daily basis Elevate legs when sitting ?  Related to chronic venous insufficiency

## 2018-11-02 NOTE — Patient Instructions (Addendum)
  Medications reviewed and updated.  Changes include :   none   A referral was ordered for hematology.    Please followup in 6 months

## 2018-11-02 NOTE — Assessment & Plan Note (Signed)
Last TSH in normal range Continue current dose of levothyroxine 

## 2018-11-04 ENCOUNTER — Ambulatory Visit (HOSPITAL_BASED_OUTPATIENT_CLINIC_OR_DEPARTMENT_OTHER): Payer: Medicare Other

## 2018-11-04 DIAGNOSIS — K5521 Angiodysplasia of colon with hemorrhage: Secondary | ICD-10-CM | POA: Diagnosis not present

## 2018-11-04 DIAGNOSIS — I341 Nonrheumatic mitral (valve) prolapse: Secondary | ICD-10-CM

## 2018-11-04 DIAGNOSIS — K625 Hemorrhage of anus and rectum: Secondary | ICD-10-CM | POA: Diagnosis not present

## 2018-11-05 ENCOUNTER — Other Ambulatory Visit (INDEPENDENT_AMBULATORY_CARE_PROVIDER_SITE_OTHER): Payer: Medicare Other

## 2018-11-05 ENCOUNTER — Encounter: Payer: Self-pay | Admitting: Gastroenterology

## 2018-11-05 ENCOUNTER — Encounter: Payer: Self-pay | Admitting: Internal Medicine

## 2018-11-05 ENCOUNTER — Ambulatory Visit: Payer: Medicare Other | Admitting: Internal Medicine

## 2018-11-05 VITALS — BP 122/64 | HR 84 | Temp 98.4°F | Resp 16 | Ht 63.0 in | Wt 145.0 lb

## 2018-11-05 DIAGNOSIS — K625 Hemorrhage of anus and rectum: Secondary | ICD-10-CM

## 2018-11-05 LAB — CBC WITH DIFFERENTIAL/PLATELET
BASOS PCT: 0.4 % (ref 0.0–3.0)
Basophils Absolute: 0 10*3/uL (ref 0.0–0.1)
Eosinophils Absolute: 0.1 10*3/uL (ref 0.0–0.7)
Eosinophils Relative: 0.9 % (ref 0.0–5.0)
HCT: 34.2 % — ABNORMAL LOW (ref 36.0–46.0)
Hemoglobin: 11.1 g/dL — ABNORMAL LOW (ref 12.0–15.0)
Lymphocytes Relative: 24.4 % (ref 12.0–46.0)
Lymphs Abs: 1.5 10*3/uL (ref 0.7–4.0)
MCHC: 32.5 g/dL (ref 30.0–36.0)
MCV: 88.4 fl (ref 78.0–100.0)
Monocytes Absolute: 0.7 10*3/uL (ref 0.1–1.0)
Monocytes Relative: 11.1 % (ref 3.0–12.0)
Neutro Abs: 4 10*3/uL (ref 1.4–7.7)
Neutrophils Relative %: 63.2 % (ref 43.0–77.0)
Platelets: 173 10*3/uL (ref 150.0–400.0)
RBC: 3.87 Mil/uL (ref 3.87–5.11)
RDW: 20.4 % — AB (ref 11.5–15.5)
WBC: 6.3 10*3/uL (ref 4.0–10.5)

## 2018-11-05 LAB — BASIC METABOLIC PANEL
BUN/Creatinine Ratio: 32 — ABNORMAL HIGH (ref 12–28)
BUN: 23 mg/dL (ref 10–36)
CO2: 25 mmol/L (ref 20–29)
Calcium: 10.1 mg/dL (ref 8.7–10.3)
Chloride: 97 mmol/L (ref 96–106)
Creatinine, Ser: 0.73 mg/dL (ref 0.57–1.00)
GFR calc Af Amer: 83 mL/min/{1.73_m2} (ref >59)
GFR calc non Af Amer: 72 mL/min/{1.73_m2} (ref >59)
Glucose: 102 mg/dL — ABNORMAL HIGH (ref 65–99)
Potassium: 4.3 mmol/L (ref 3.5–5.2)
Sodium: 137 mmol/L (ref 134–144)

## 2018-11-05 NOTE — Progress Notes (Signed)
Subjective:    Patient ID: Grace Alvarado, female    DOB: [birth year], 85 y.o.   MRN: [id number]  HPI The patient is here for an acute visit.  Both of her daughters are with her.   Stool with blood:  Last night she had 4 episodes of loose stool with blood in it.  With her bowel movements she did have some lower abdominal pain that resolved after she had a bowel movement.  Early in the morning she did have a formed stool without blood in the stool, but there was a small amount of blood on the toilet paper when she wiped.  She does on occasion have some loose stools with certain foods.  She has not had any fevers or chills.  She denies any lightheadedness, dizziness or headaches out of the ordinary.  She denies any shortness of breath, palpitations or chest pain.  She does have a history of diverticulosis.  She denies any known history of hemorrhoids.  She was recently admitted to the hospital for symptomatic anemia and had to have a blood transfusion.  She is on Xarelto for atrial fibrillation.  She was just called today by hematology to schedule an appointment because the reason for the anemia was unknown.  Hemoccult was negative recently.  Medications and allergies reviewed with patient and updated if appropriate.  Patient Active Problem List   Diagnosis Date Noted  . Iron deficiency anemia 11/02/2018  . Lymphedema of left lower extremity 10/22/2018  . B12 deficiency 05/27/2018  . Traumatic hematoma of right knee 05/25/2018  . Dependent edema 05/25/2018  . Cellulitis 05/22/2018  . GERD (gastroesophageal reflux disease) 05/20/2018  . Pulmonary hypertension, primary (HCC) 05/20/2018  . Hyperlipidemia 05/20/2018  . CHF (congestive heart failure) (HCC) 05/20/2018  . Tricuspid regurgitation 05/17/2018  . Mitral regurgitation 05/17/2018  . Chronic a-fib 05/17/2018  . Essential hypertension 05/17/2018  . Hypothyroid 05/17/2018  . CAD (coronary artery disease) 05/17/2018  . Carotid  atherosclerosis, bilateral 05/17/2018    Current Outpatient Medications on File Prior to Visit  Medication Sig Dispense Refill  . diltiazem (CARDIZEM SR) 120 MG 12 hr capsule Take 1 capsule (120 mg total) by mouth 2 (two) times daily. 180 capsule 1  . ferrous gluconate (FERGON) 324 MG tablet Take 1 tablet (324 mg total) by mouth 3 (three) times daily with meals. 90 tablet 0  . furosemide (LASIX) 20 MG tablet Take 2 tablets (40 mg total) by mouth daily. 180 tablet 3  . levothyroxine (SYNTHROID, LEVOTHROID) 75 MCG tablet Take 1 tablet (75 mcg total) by mouth daily before breakfast. 90 tablet 3  . potassium chloride (K-DUR) 10 MEQ tablet Take 1 tablet (10 mEq total) by mouth daily. 30 tablet 0  . pravastatin (PRAVACHOL) 10 MG tablet Take 10 mg by mouth daily.  90 tablet 3  . rivaroxaban (XARELTO) 20 MG TABS tablet Take 20 mg by mouth daily with supper.     No current facility-administered medications on file prior to visit.     Past Medical History:  Diagnosis Date  . Atrial fibrillation (HCC)   . Hyperlipidemia   . Hypertension   . Hypothyroidism   . Mitral regurgitation   . MVP (mitral valve prolapse)     Past Surgical History:  Procedure Laterality Date  . APPENDECTOMY    . c section    . CESAREAN SECTION    . CHOLECYSTECTOMY      Social History   Socioeconomic History  . Marital status:  Widowed    Spouse name: Not on file  . Number of children: 3  . Years of education: Not on file  . Highest education level: Not on file  Occupational History  . Occupation: retired  Engineer, production  . Financial resource strain: Not on file  . Food insecurity:    Worry: Not on file    Inability: Not on file  . Transportation needs:    Medical: Not on file    Non-medical: Not on file  Tobacco Use  . Smoking status: Former Games developer  . Smokeless tobacco: Never Used  Substance and Sexual Activity  . Alcohol use: Not Currently  . Drug use: Never  . Sexual activity: Not Currently    Lifestyle  . Physical activity:    Days per week: Not on file    Minutes per session: Not on file  . Stress: Not on file  Relationships  . Social connections:    Talks on phone: Not on file    Gets together: Not on file    Attends religious service: Not on file    Active member of club or organization: Not on file    Attends meetings of clubs or organizations: Not on file    Relationship status: Not on file  Other Topics Concern  . Not on file  Social History Narrative  . Not on file    Family History  Problem Relation Age of Onset  . Cancer Mother   . Dementia Mother     Review of Systems  Constitutional: Negative for chills and fever.  Respiratory: Negative for shortness of breath.   Cardiovascular: Negative for chest pain and palpitations.  Neurological: Positive for headaches (sinus, nothing new). Negative for dizziness and light-headedness.       Objective:   Vitals:   11/05/18 1520  BP: 122/64  Pulse: 84  Resp: 16  Temp: 98.4 F (36.9 C)  SpO2: 99%   BP Readings from Last 3 Encounters:  11/05/18 122/64  11/02/18 (!) 146/60  10/29/18 118/60   Wt Readings from Last 3 Encounters:  11/05/18 145 lb (65.8 kg)  11/02/18 145 lb (65.8 kg)  10/29/18 142 lb 11.2 oz (64.7 kg)   Body mass index is 25.69 kg/m.   Physical Exam Constitutional:      General: She is not in acute distress.    Appearance: Normal appearance. She is not ill-appearing.  HENT:     Head: Normocephalic and atraumatic.  Abdominal:     General: Abdomen is flat. There is no distension.     Palpations: Abdomen is soft. There is no mass.     Tenderness: There is abdominal tenderness (Mild tenderness left lower quadrant-she was unaware that she had tenderness there). There is no guarding or rebound.  Skin:    General: Skin is warm and dry.     Coloration: Skin is not pale.  Neurological:     Mental Status: She is alert.            Assessment & Plan:    See Problem List for  Assessment and Plan of chronic medical problems.

## 2018-11-05 NOTE — Assessment & Plan Note (Signed)
Last night for episodes of loose stool with blood in it, early this morning formed stool without blood, but some mild blood minimal blood on toilet paper Asymptomatic Recently admitted for symptomatic anemia and required a blood transfusion, on Xarelto Fecal Hemoccult negative previously Has an appointment with hematology for further evaluation of anemia, but could be GI bleed Repeat CBC today.  Since she is asymptomatic we will not send to the ED.  If she begins to bleed over the weekend or has any symptoms such as lightheadedness, shortness of breath she will need to go the emergency room We will order an urgent referral for GI-she really does not want to have any procedures done, but needs further evaluation of a possible GI bleed given that she is on Xarelto

## 2018-11-05 NOTE — Patient Instructions (Addendum)
Have blood work done today to check your anemia.   A referral was ordered for GI - they will call you next week.

## 2018-11-06 ENCOUNTER — Inpatient Hospital Stay (HOSPITAL_COMMUNITY)
Admission: EM | Admit: 2018-11-06 | Discharge: 2018-11-10 | DRG: 378 | Disposition: A | Discharge status: Home / Self Care | Payer: Medicare Other | Attending: Internal Medicine | Admitting: Internal Medicine

## 2018-11-06 ENCOUNTER — Other Ambulatory Visit: Payer: Self-pay

## 2018-11-06 ENCOUNTER — Encounter: Payer: Self-pay | Admitting: Internal Medicine

## 2018-11-06 ENCOUNTER — Emergency Department (HOSPITAL_COMMUNITY): Payer: Medicare Other

## 2018-11-06 DIAGNOSIS — I272 Pulmonary hypertension, unspecified: Secondary | ICD-10-CM | POA: Diagnosis present

## 2018-11-06 DIAGNOSIS — K921 Melena: Secondary | ICD-10-CM

## 2018-11-06 DIAGNOSIS — K5730 Diverticulosis of large intestine without perforation or abscess without bleeding: Secondary | ICD-10-CM | POA: Diagnosis present

## 2018-11-06 DIAGNOSIS — I2510 Atherosclerotic heart disease of native coronary artery without angina pectoris: Secondary | ICD-10-CM | POA: Diagnosis present

## 2018-11-06 DIAGNOSIS — I482 Chronic atrial fibrillation, unspecified: Secondary | ICD-10-CM | POA: Diagnosis present

## 2018-11-06 DIAGNOSIS — Z7901 Long term (current) use of anticoagulants: Secondary | ICD-10-CM | POA: Diagnosis not present

## 2018-11-06 DIAGNOSIS — Z79890 Hormone replacement therapy (postmenopausal): Secondary | ICD-10-CM | POA: Diagnosis not present

## 2018-11-06 DIAGNOSIS — K5520 Angiodysplasia of colon without hemorrhage: Secondary | ICD-10-CM

## 2018-11-06 DIAGNOSIS — R1032 Left lower quadrant pain: Secondary | ICD-10-CM

## 2018-11-06 DIAGNOSIS — I341 Nonrheumatic mitral (valve) prolapse: Secondary | ICD-10-CM | POA: Diagnosis present

## 2018-11-06 DIAGNOSIS — D696 Thrombocytopenia, unspecified: Secondary | ICD-10-CM | POA: Diagnosis present

## 2018-11-06 DIAGNOSIS — D500 Iron deficiency anemia secondary to blood loss (chronic): Secondary | ICD-10-CM | POA: Diagnosis not present

## 2018-11-06 DIAGNOSIS — E785 Hyperlipidemia, unspecified: Secondary | ICD-10-CM | POA: Diagnosis present

## 2018-11-06 DIAGNOSIS — Z881 Allergy status to other antibiotic agents status: Secondary | ICD-10-CM | POA: Diagnosis not present

## 2018-11-06 DIAGNOSIS — Z91040 Latex allergy status: Secondary | ICD-10-CM | POA: Diagnosis not present

## 2018-11-06 DIAGNOSIS — D508 Other iron deficiency anemias: Secondary | ICD-10-CM

## 2018-11-06 DIAGNOSIS — D509 Iron deficiency anemia, unspecified: Secondary | ICD-10-CM | POA: Diagnosis present

## 2018-11-06 DIAGNOSIS — Z880 Allergy status to penicillin: Secondary | ICD-10-CM | POA: Diagnosis not present

## 2018-11-06 DIAGNOSIS — Z9049 Acquired absence of other specified parts of digestive tract: Secondary | ICD-10-CM | POA: Diagnosis not present

## 2018-11-06 DIAGNOSIS — K922 Gastrointestinal hemorrhage, unspecified: Secondary | ICD-10-CM | POA: Diagnosis not present

## 2018-11-06 DIAGNOSIS — Z885 Allergy status to narcotic agent status: Secondary | ICD-10-CM

## 2018-11-06 DIAGNOSIS — E876 Hypokalemia: Secondary | ICD-10-CM | POA: Diagnosis present

## 2018-11-06 DIAGNOSIS — E039 Hypothyroidism, unspecified: Secondary | ICD-10-CM | POA: Diagnosis present

## 2018-11-06 DIAGNOSIS — Z79899 Other long term (current) drug therapy: Secondary | ICD-10-CM

## 2018-11-06 DIAGNOSIS — K5521 Angiodysplasia of colon with hemorrhage: Secondary | ICD-10-CM | POA: Diagnosis present

## 2018-11-06 DIAGNOSIS — R600 Localized edema: Secondary | ICD-10-CM | POA: Diagnosis present

## 2018-11-06 DIAGNOSIS — K625 Hemorrhage of anus and rectum: Secondary | ICD-10-CM | POA: Diagnosis present

## 2018-11-06 DIAGNOSIS — K449 Diaphragmatic hernia without obstruction or gangrene: Secondary | ICD-10-CM | POA: Diagnosis present

## 2018-11-06 DIAGNOSIS — I340 Nonrheumatic mitral (valve) insufficiency: Secondary | ICD-10-CM | POA: Diagnosis present

## 2018-11-06 DIAGNOSIS — K6289 Other specified diseases of anus and rectum: Secondary | ICD-10-CM | POA: Diagnosis not present

## 2018-11-06 DIAGNOSIS — Z87891 Personal history of nicotine dependence: Secondary | ICD-10-CM | POA: Diagnosis not present

## 2018-11-06 DIAGNOSIS — D62 Acute posthemorrhagic anemia: Secondary | ICD-10-CM | POA: Diagnosis present

## 2018-11-06 DIAGNOSIS — I10 Essential (primary) hypertension: Secondary | ICD-10-CM | POA: Diagnosis present

## 2018-11-06 LAB — HEPATIC FUNCTION PANEL
ALT: 19 U/L (ref 0–44)
AST: 28 U/L (ref 15–41)
Albumin: 3.9 g/dL (ref 3.5–5.0)
Alkaline Phosphatase: 42 U/L (ref 38–126)
Bilirubin, Direct: 0.2 mg/dL (ref 0.0–0.2)
Indirect Bilirubin: 0.2 mg/dL — ABNORMAL LOW (ref 0.3–0.9)
Total Bilirubin: 0.4 mg/dL (ref 0.3–1.2)
Total Protein: 7.4 g/dL (ref 6.5–8.1)

## 2018-11-06 LAB — CBC WITH DIFFERENTIAL/PLATELET
Abs Immature Granulocytes: 0.02 10*3/uL (ref 0.00–0.07)
Basophils Absolute: 0 10*3/uL (ref 0.0–0.1)
Basophils Relative: 0 %
Eosinophils Absolute: 0 10*3/uL (ref 0.0–0.5)
Eosinophils Relative: 0 %
HCT: 33.6 % — ABNORMAL LOW (ref 36.0–46.0)
Hemoglobin: 10.2 g/dL — ABNORMAL LOW (ref 12.0–15.0)
Immature Granulocytes: 0 %
Lymphocytes Relative: 14 %
Lymphs Abs: 1.2 10*3/uL (ref 0.7–4.0)
MCH: 28.3 pg (ref 26.0–34.0)
MCHC: 30.4 g/dL (ref 30.0–36.0)
MCV: 93.1 fL (ref 80.0–100.0)
Monocytes Absolute: 0.7 10*3/uL (ref 0.1–1.0)
Monocytes Relative: 8 %
Neutro Abs: 6.6 10*3/uL (ref 1.7–7.7)
Neutrophils Relative %: 78 %
Platelets: 178 10*3/uL (ref 150–400)
RBC: 3.61 MIL/uL — ABNORMAL LOW (ref 3.87–5.11)
RDW: 18.8 % — ABNORMAL HIGH (ref 11.5–15.5)
WBC: 8.6 10*3/uL (ref 4.0–10.5)
nRBC: 0 % (ref 0.0–0.2)

## 2018-11-06 LAB — BASIC METABOLIC PANEL
Anion gap: 10 (ref 5–15)
BUN: 16 mg/dL (ref 8–23)
CO2: 26 mmol/L (ref 22–32)
Calcium: 9.6 mg/dL (ref 8.9–10.3)
Chloride: 100 mmol/L (ref 98–111)
Creatinine, Ser: 0.75 mg/dL (ref 0.44–1.00)
GFR calc Af Amer: >60 mL/min (ref >60)
GFR calc non Af Amer: >60 mL/min (ref >60)
Glucose, Bld: 102 mg/dL — ABNORMAL HIGH (ref 70–99)
Potassium: 3.6 mmol/L (ref 3.5–5.1)
Sodium: 136 mmol/L (ref 135–145)

## 2018-11-06 LAB — HEMOGLOBIN
Hemoglobin: 9 g/dL — ABNORMAL LOW (ref 12.0–15.0)
Hemoglobin: 8.1 g/dL — ABNORMAL LOW (ref 12.0–15.0)

## 2018-11-06 LAB — POC OCCULT BLOOD, ED: Fecal Occult Bld: POSITIVE — AB

## 2018-11-06 LAB — LIPASE, BLOOD: Lipase: 26 U/L (ref 11–51)

## 2018-11-06 MED ORDER — SODIUM CHLORIDE 0.9 % IV SOLN
INTRAVENOUS | Status: DC
  Administered 2018-11-06 – 2018-11-09 (×4): via INTRAVENOUS

## 2018-11-06 MED ORDER — ONDANSETRON HCL 4 MG/2ML IJ SOLN: 4.0000 mg | Freq: Four times a day (QID) | INTRAMUSCULAR | Status: DC | PRN

## 2018-11-06 MED ORDER — DILTIAZEM HCL ER 60 MG PO CP12
120.0000 mg | ORAL_CAPSULE | Freq: Two times a day (BID) | ORAL | Status: DC
  Administered 2018-11-06 – 2018-11-10 (×8): 120 mg via ORAL
  Filled 2018-11-06 (×9): qty 2

## 2018-11-06 MED ORDER — POTASSIUM CHLORIDE CRYS ER 10 MEQ PO TBCR
10.0000 meq | EXTENDED_RELEASE_TABLET | Freq: Every day | ORAL | Status: DC
  Administered 2018-11-07 – 2018-11-10 (×4): 10 meq via ORAL
  Filled 2018-11-06 (×5): qty 1

## 2018-11-06 MED ORDER — LEVOTHYROXINE SODIUM 75 MCG PO TABS
75.0000 ug | ORAL_TABLET | Freq: Every day | ORAL | Status: DC
  Administered 2018-11-07 – 2018-11-10 (×4): 75 ug via ORAL
  Filled 2018-11-06 (×5): qty 1

## 2018-11-06 MED ORDER — ZOLPIDEM TARTRATE 5 MG PO TABS: 5.0000 mg | ORAL_TABLET | Freq: Every evening | ORAL | Status: DC | PRN

## 2018-11-06 MED ORDER — IOHEXOL 300 MG/ML  SOLN
100.0000 mL | Freq: Once | INTRAMUSCULAR | Status: AC | PRN
  Administered 2018-11-06: 100 mL via INTRAVENOUS

## 2018-11-06 MED ORDER — PANTOPRAZOLE SODIUM 40 MG IV SOLR
40.0000 mg | Freq: Two times a day (BID) | INTRAVENOUS | Status: DC
  Administered 2018-11-06 – 2018-11-09 (×8): 40 mg via INTRAVENOUS
  Filled 2018-11-06 (×9): qty 40

## 2018-11-06 MED ORDER — PRAVASTATIN SODIUM 10 MG PO TABS
10.0000 mg | ORAL_TABLET | Freq: Every day | ORAL | Status: DC
  Administered 2018-11-06 – 2018-11-09 (×4): 10 mg via ORAL
  Filled 2018-11-06 (×4): qty 1

## 2018-11-06 MED ORDER — FERROUS GLUCONATE 324 (38 FE) MG PO TABS
324.0000 mg | ORAL_TABLET | Freq: Three times a day (TID) | ORAL | Status: DC
  Administered 2018-11-06 – 2018-11-07 (×3): 324 mg via ORAL
  Filled 2018-11-06 (×4): qty 1

## 2018-11-06 MED ORDER — ONDANSETRON HCL 4 MG PO TABS: 4.0000 mg | ORAL_TABLET | Freq: Four times a day (QID) | ORAL | Status: DC | PRN

## 2018-11-06 NOTE — ED Triage Notes (Signed)
Pt c/o rectal bleeding, abd pain , and "loose stools with bright red blood x 2 days ; pt denies any chest pain/ sob/n/v/weakness/dizziness ; pt alert and oriented x 4

## 2018-11-06 NOTE — ED Provider Notes (Signed)
Medical screening examination/treatment/procedure(s) were conducted as a shared visit with non-physician practitioner(s) and myself.  I personally evaluated the patient during the encounter. Briefly, the patient is a 85 y.o. female with history of atrial fibrillation on anticoagulation, high cholesterol, hypertension who presents the ED with bloody stools.  Patient had recent admission for blood transfusion due to symptomatic anemia.  She did not want to undergo any GI work-up at that time.  She has been started on iron pills.  She denies any chest pain, shortness of breath today.  She has noticed blood in her stool both bright red and dark.  Appears to have gross melena on exam.  Has some left lower quadrant abdominal pain on exam.  History of diverticulosis.  Overall she is well-appearing.  Vitals are significant for some mild tachycardia.  Patient does have afib on EKG.  Concern for possible GI bleed.  Will obtain lab work including CT of abdomen and pelvis.  Patient with hemoglobin of 10.2.  This is down one-point from yesterday.  Occult test was positive.  Otherwise no significant electrolyte abnormality, leukocytosis.  CT scan of the abdomen and pelvis did not reveal any acute findings.  No diverticulitis.  No bowel perforation.  GI was consulted and recommended admission as they will likely perform endoscopy.  Patient was hemodynamically stable throughout my care and admitted to hospital service for GI bleed.  This chart was dictated using voice recognition software.  Despite best efforts to proofread,  errors can occur which can change the documentation meaning.     EKG Interpretation  Date/Time:  Saturday November 06 2018 10:27:41 EST Ventricular Rate:  95 PR Interval:    QRS Duration: 73 QT Interval:  322 QTC Calculation: 405 R Axis:   58 Text Interpretation:  Atrial fibrillation Low voltage, precordial leads Minimal ST depression, diffuse leads Baseline wander in lead(s) I aVR Confirmed by  Virgina Norfolk [phone]) on 11/06/2018 1:57:30 PM           Virgina Norfolk, DO 11/06/18 1358

## 2018-11-06 NOTE — Progress Notes (Signed)
Notified MD Hijazi via amion that pt wants to be DNR- pt is currently a FULL CODE. However upon further talking to her pt is unclear of code status states she does "not want to suffer" and "if it were up to me I'd just be a DNR." Explained to pt that it is her decision and that she is currently a full code now. Stated she would think about this decision and give an answer later.   Leonia Reeves, RN

## 2018-11-06 NOTE — Consult Note (Signed)
Referring Provider: [facility]   Primary Care Physician:  Pincus SanesBurns, Stacy J, MD Primary Gastroenterologist:  None.  Reason for Consultation: rectal bleeding / anemia    ASSESSMENT / PLAN:     85 year old female with iron deficiency anemia (ferritin 6.5 last month). Now with new onset rectal bleeding with BM on Xarelto.  Describes LLQ tenderness to palpation, no actual pain.  Nontender on my exam.  CTAP w/ contrast is unremarkable -Last Xarelto was yesterday afternoon.  -Regarding work-up, patient was initially reluctant to have invasive work-up.  We discussed options such as not doing anything, though we could be missing something very treatable.  We may also be missing a colon neoplasm.  Furthermore,  she would likely continue to bleed on blood thinners.  She is interested in pursuing colonoscopy -Iron deficiency anemia warrants upper endoscopy if colonoscopy is negative.  Patient is agreeable to this, she will be consented for both procedures  Atrial fibrillation, home Xarelto on hold  HPI:      HPI: Grace Alvarado is a 85 y.o. female with a history of hypertension, hyperlipidemia and atrial fibrillation on Xarelto.  Patient was to have to the ED mid January for a decline in hemoglobin from 11.3 late August to 7.8.  She was Hemoccult positive.  MCV normal at 93 but ferritin was only 6.5.   No overt bleeding reported during or before that hospitalization . She had a brief stay in the hospital, received a unit of blood.  Hemoglobin on 10/23/2018 was up to 9.4, Xarelto was restarted/continued.  Patient saw PCP a couple of days later, she declined GI referral, was not interested in invasive work-up.  Oral iron was continued, patient referred to Hematology.  Patient saw PCP again yesterday, that time for evaluation of loose stool containing blood.  She had associated lower abdominal pain that resolved after a bowel movement.  Patient was asymptomatic, CBC obtained hemoglobin is actually  improved at 11.1.  Patient came to the ED this morning with complaints of recurrent rectal bleeding and abdominal pain.  Overall she is had about 4 bowel movements with blood.  No pain.  She does not have abdominal pain but describes tenderness in the left lower quadrant, especially when examined.  Except for the recent loose stools with blood, her bowel movements are generally normal.  She has not had a colonoscopy in many, many years.  ED evaluation: Initially she was mildly tachycardic, resolved.  BP 138/72 Normal white count, hemoglobin down 1 g to 10.2. CMET unremarkable, lipase normal at 26. CTAP with contrast-no acute abnormalities    Past Medical History:  Diagnosis Date  . Atrial fibrillation (HCC)   . Hyperlipidemia   . Hypertension   . Hypothyroidism   . Mitral regurgitation   . MVP (mitral valve prolapse)     Past Surgical History:  Procedure Laterality Date  . APPENDECTOMY    . c section    . CESAREAN SECTION    . CHOLECYSTECTOMY      Prior to Admission medications   Medication Sig Start Date End Date Taking? Authorizing Provider  cholecalciferol (VITAMIN D3) 25 MCG (1000 UT) tablet Take 1,000 Units by mouth daily.   Yes [provider]  diltiazem (CARDIZEM SR) 120 MG 12 hr capsule Take 1 capsule (120 mg total) by mouth 2 (two) times daily. 05/17/18  Yes Orland MustardWolfe, Allison, MD  ferrous gluconate (FERGON) 324 MG tablet Take 1 tablet (324 mg total) by mouth 3 (three) times daily with meals.  Patient taking differently: Take 324 mg by mouth 2 (two) times daily with a meal.  10/21/18  Yes [name], MD  furosemide (LASIX) 20 MG tablet Take 2 tablets (40 mg total) by mouth daily. 10/29/18  Yes Lewayne Bunting, MD  levothyroxine (SYNTHROID, LEVOTHROID) 75 MCG tablet Take 1 tablet (75 mcg total) by mouth daily before breakfast. 05/17/18  Yes [name], MD  Multiple Vitamin (VITAMIN E/FOLIC ACID/B-6/B-12 PO) Take 1 tablet by mouth daily.   Yes [provider]  potassium chloride (K-DUR) 10 MEQ tablet Take 1 tablet (10 mEq total) by mouth daily. 10/23/18  Yes Leroy Sea, MD  pravastatin (PRAVACHOL) 10 MG tablet Take 10 mg by mouth daily.  10/25/18  Yes [name], MD  Probiotic Product (PRO-BIOTIC BLEND PO) Take 1 capsule by mouth daily.   Yes [provider]  rivaroxaban (XARELTO) 20 MG TABS tablet Take 20 mg by mouth daily with supper.   Yes [provider]  vitamin C (ASCORBIC ACID) 500 MG tablet Take 500 mg by mouth daily.   Yes [provider]    Current Facility-Administered Medications  Medication Dose Route Frequency Provider Last Rate Last Dose  . 0.9 %  sodium chloride infusion   Intravenous Continuous Carron Curie, MD      . diltiazem (CARDIZEM SR) 12 hr capsule 120 mg  120 mg Oral BID Carron Curie, MD      . ferrous gluconate (FERGON) tablet 324 mg  324 mg Oral TID WC Carron Curie, MD      . Melene Muller ON 11/07/2018] levothyroxine (SYNTHROID, LEVOTHROID) tablet 75 mcg  75 mcg Oral QAC breakfast Carron Curie, MD      . ondansetron (ZOFRAN) tablet 4 mg  4 mg Oral Q6H PRN Carron Curie, MD       Or  . ondansetron (ZOFRAN) injection 4 mg  4 mg Intravenous Q6H PRN Carron Curie, MD      . pantoprazole (PROTONIX) injection 40 mg  40 mg Intravenous Q12H Carron Curie, MD      . Melene Muller ON 11/07/2018] potassium chloride (K-DUR) CR tablet 10 mEq  10 mEq Oral Daily Carron Curie, MD      . pravastatin (PRAVACHOL) tablet 10 mg  10 mg Oral Q2000 Carron Curie, MD      . zolpidem (AMBIEN) tablet 5 mg  5 mg Oral QHS PRN Carron Curie, MD        Allergies as of 11/06/2018 - Review Complete 11/06/2018  Allergen Reaction Noted  . Codeine Other (See Comments) 05/13/2018  . Penicillins Other (See Comments) 05/13/2018  . Vancomycin Other (See Comments) 05/13/2018  . Zithromax [azithromycin] Other (See Comments) 05/13/2018  . Latex Rash 05/13/2018    Family History  Problem Relation Age of Onset  . Cancer Mother   . Dementia Mother      Social History   Socioeconomic History  . Marital status: Widowed    Spouse name: Not on file  . Number of children: 3  . Years of education: Not on file  . Highest education level: Not on file  Occupational History  . Occupation: retired  Engineer, production  . Financial resource strain: Not on file  . Food insecurity:    Worry: Not on file    Inability: Not on file  . Transportation needs:    Medical: Not on file    Non-medical: Not on file  Tobacco Use  . Smoking status: Former Games developer  . Smokeless tobacco: Never Used  Substance and Sexual Activity  . Alcohol use: Not Currently  . Drug use: Never  . Sexual activity: Not Currently  Lifestyle  . Physical activity:    Days per week: Not on file    Minutes per session: Not on file  . Stress: Not on file  Relationships  . Social connections:    Talks on phone: Not on file    Gets together: Not on file    Attends religious service: Not on file    Active member of club or organization: Not on file    Attends meetings of clubs or organizations: Not on file    Relationship status: Not on file  . Intimate partner violence:    Fear of current or ex partner: Not on file    Emotionally abused: Not on file    Physically abused: Not on file    Forced sexual activity: Not on file  Other Topics Concern  . Not on file  Social History Narrative  . Not on file    Review of Systems: All systems reviewed and negative except where noted in HPI.  Physical Exam: Vital signs in last 24 hours: Temp:  [97.5 F (36.4 C)-98.6 F (37 C)] 98.6 F (37 C) (02/01 1514) Pulse Rate:  [96-108] 96 (02/01 1514) Resp:  [15-18] 15 (02/01 1248) BP: (114-138)/(45-90) 114/45 (02/01 1514) SpO2:  [98 %-100 %] 100 % (02/01 1514) Weight:  [63.5 kg] 63.5 kg (02/01 1022) Last BM Date: 11/06/18 General:   Alert, petite female in NAD.  Daughters visiting Psych:  Pleasant, cooperative. Normal mood and affect. Eyes:  Pupils equal, sclera clear, no  icterus.   Conjunctiva pink. Ears:  Normal auditory acuity. Nose:  No deformity, discharge,  or lesions. Neck:  Supple; no masses Lungs:  Clear throughout to auscultation.   No wheezes, crackles, or rhonchi.  Heart:  Regular rate, irreg rhythm, 1-2+ LLE edema (says chronic) Abdomen:  Soft, non-distended, nontender, BS active, no palp mass    Rectal:  Deferred  Msk:  Symmetrical without gross deformities. . Neurologic:  Alert and  oriented x4;  grossly normal neurologically. Skin:  Intact without significant lesions or rashes.   Intake/Output from previous day: No intake/output data recorded. Intake/Output this shift: No intake/output data recorded.  Lab Results: Recent Labs    11/05/18 1556 11/06/18 1026 11/06/18 1458  WBC 6.3 8.6  --   HGB 11.1* 10.2* 9.0*  HCT 34.2* 33.6*  --   PLT 173.0 178  --    BMET Recent Labs    11/05/18 0946 11/06/18 1026  NA 137 136  K 4.3 3.6  CL 97 100  CO2 25 26  GLUCOSE 102* 102*  BUN 23 16  CREATININE 0.73 0.75  CALCIUM 10.1 9.6   LFT Recent Labs    11/06/18 1026  PROT 7.4  ALBUMIN 3.9  AST 28  ALT 19  ALKPHOS 42  BILITOT 0.4  BILIDIR 0.2  IBILI 0.2*      Studies/Results: Ct Abdomen Pelvis W Contrast  Result Date: 11/06/2018 CLINICAL DATA:  Rectal bleeding, abdominal pain. EXAM: CT ABDOMEN AND PELVIS WITH CONTRAST TECHNIQUE: Multidetector CT imaging of the abdomen and pelvis was performed using the standard protocol following bolus administration of intravenous contrast. CONTRAST:  100mL OMNIPAQUE IOHEXOL 300 MG/ML  SOLN COMPARISON:  None. FINDINGS: Lower chest: No acute abnormality. Hepatobiliary: No focal liver abnormality is seen. Status post cholecystectomy. No biliary dilatation. Pancreas: Unremarkable. No pancreatic ductal dilatation or surrounding inflammatory changes. Spleen: Normal  in size without focal abnormality. Adrenals/Urinary Tract: Adrenal glands are unremarkable. Kidneys are normal, without renal calculi,  focal lesion, or hydronephrosis. Bladder is unremarkable. Stomach/Bowel: The stomach appears normal. There is no evidence of bowel obstruction or inflammation. Sigmoid diverticulosis is noted. Status post appendectomy. Vascular/Lymphatic: Aortic atherosclerosis. No enlarged abdominal or pelvic lymph nodes. Reproductive: Uterus and bilateral adnexa are unremarkable. Other: No abdominal wall hernia or abnormality. No abdominopelvic ascites. Musculoskeletal: Severe multilevel degenerative disc disease is noted in lower lumbar spine. No acute abnormality is noted. IMPRESSION: Sigmoid diverticulosis without inflammation. Severe multilevel degenerative disc disease in lower lumbar spine. No acute abnormality seen in the abdomen or pelvis. Aortic Atherosclerosis (ICD10-I70.0). Electronically Signed   By: Lupita Raider, M.D.   On: 11/06/2018 11:51     Willette Cluster, NP-C @  11/06/2018, 3:36 PM

## 2018-11-06 NOTE — H&P (Signed)
[facility]                                                                                    Patient Demographics  Grace Alvarado, is a 85 y.o. female  CSN: [id number]  MRN: [id number]  DOB - Oct 31, 1924  Admit Date - 11/06/2018  Outpatient Primary MD for the patient is Pincus Sanes, MD   With History of -  Past Medical History:  Diagnosis Date  . Atrial fibrillation (HCC)   . Hyperlipidemia   . Hypertension   . Hypothyroidism   . Mitral regurgitation   . MVP (mitral valve prolapse)       Past Surgical History:  Procedure Laterality Date  . APPENDECTOMY    . c section    . CESAREAN SECTION    . CHOLECYSTECTOMY      in for   Chief Complaint  Patient presents with  . Rectal Bleeding     HPI  Grace Alvarado  is a 85 y.o. female, with past medical history significant for A. fib on Xarelto presenting with 2 days history of frank blood per rectum.  In the emergency room she was guaiac positive but no fresh blood was noted.  Her hemoglobin is 10.2 down from 11.1 on 1/31.  Patient denies abdominal pain, nausea or vomiting.  Denies any diarrhea.  Denies any fever or chills.  No chest pains or shortness of breath.  Mild generalized weakness. Gastroenterology was contacted and they will follow up for endoscopy.  CT of abdomen showed sigmoid diverticulosis without inflammation with no acute abnormality seen in the abdomen and pelvis.  Her heart rate in the emergency room was 102-110 .  We decided to place her on telemetry    Review of Systems    In addition to the HPI above,  No Fever-chills, No Headache, No changes with Vision or hearing, No problems swallowing food or Liquids, No Chest pain, Cough or Shortness of Breath, No Abdominal pain, No Nausea or Vommitting, Bowel movements are regular, No dysuria, No new skin rashes or bruises, No new joints pains-aches,  No new weakness, tingling, numbness in any extremity, No recent weight gain or loss, No  polyuria, polydypsia or polyphagia, No significant Mental Stressors.  A full 10 point Review of Systems was done, except as stated above, all other Review of Systems were negative.   Social History Social History   Tobacco Use  . Smoking status: Former Games developer  . Smokeless tobacco: Never Used  Substance Use Topics  . Alcohol use: Not Currently     Family History Family History  Problem Relation Age of Onset  . Cancer Mother   . Dementia Mother      Prior to Admission medications   Medication Sig Start Date End Date Taking? Authorizing Provider  diltiazem (CARDIZEM SR) 120 MG 12 hr capsule Take 1 capsule (120 mg total) by mouth 2 (two) times daily. 05/17/18   [name], MD  ferrous gluconate (FERGON) 324 MG tablet Take 1 tablet (324 mg total) by mouth 3 (three) times daily with meals. 10/21/18   [name], MD  furosemide (LASIX) 20 MG tablet Take 2  tablets (40 mg total) by mouth daily. 10/29/18   Lewayne Bunting, MD  levothyroxine (SYNTHROID, LEVOTHROID) 75 MCG tablet Take 1 tablet (75 mcg total) by mouth daily before breakfast. 05/17/18   [name], MD  potassium chloride (K-DUR) 10 MEQ tablet Take 1 tablet (10 mEq total) by mouth daily. 10/23/18   Leroy Sea, MD  pravastatin (PRAVACHOL) 10 MG tablet Take 10 mg by mouth daily.  10/25/18   [name], MD  rivaroxaban (XARELTO) 20 MG TABS tablet Take 20 mg by mouth daily with supper.    [provider]    Allergies  Allergen Reactions  . Codeine Other (See Comments)    unknown  . Penicillins Other (See Comments)    unknown  . Vancomycin Other (See Comments)    unknown  . Zithromax [Azithromycin] Other (See Comments)    unknown  . Latex Rash    Physical Exam  Vitals  Blood pressure 138/72, pulse (!) 102, temperature (!) 97.5 F (36.4 C), temperature source Oral, resp. rate 15, height 5\' 3"  (1.6 m), weight 63.5 kg, SpO2 99 %.   1. General extremely pleasant, well-developed and  well-nourished, in no acute distress  2. Normal affect and insight, Not Suicidal or Homicidal, Awake Alert, Oriented X 3.  3. No F.N deficits, grossly, patient moving all extremities.  4. Ears and Eyes appear Normal, Conjunctivae clear, PERRLA. Moist Oral Mucosa.  5. Supple Neck, No JVD, No cervical lymphadenopathy appriciated, No Carotid Bruits.  6. Symmetrical Chest wall movement, Good air movement bilaterally, CTAB.  7.  Irregularly irregular, tachycardic, systolic murmur heard at the fifth intercostal space  8. Positive Bowel Sounds, Abdomen Soft, mild left lower quadrant tenderness.  9.  No Cyanosis, Normal Skin Turgor, No Skin Rash or Bruise.  10. Good muscle tone,  joints appear normal , no effusions, Normal ROM.    Data Review  CBC Recent Labs  Lab 11/05/18 1556 11/06/18 1026  WBC 6.3 8.6  HGB 11.1* 10.2*  HCT 34.2* 33.6*  PLT 173.0 178  MCV 88.4 93.1  MCH  --  28.3  MCHC 32.5 30.4  RDW 20.4* 18.8*  LYMPHSABS 1.5 1.2  MONOABS 0.7 0.7  EOSABS 0.1 0.0  BASOSABS 0.0 0.0   ------------------------------------------------------------------------------------------------------------------  Chemistries  Recent Labs  Lab 11/05/18 0946 11/06/18 1026  NA 137 136  K 4.3 3.6  CL 97 100  CO2 25 26  GLUCOSE 102* 102*  BUN 23 16  CREATININE 0.73 0.75  CALCIUM 10.1 9.6  AST  --  28  ALT  --  19  ALKPHOS  --  42  BILITOT  --  0.4   ------------------------------------------------------------------------------------------------------------------ estimated creatinine clearance is 41.1 mL/min (by C-G formula based on SCr of 0.75 mg/dL). ------------------------------------------------------------------------------------------------------------------ No results for input(s): TSH, T4TOTAL, T3FREE, THYROIDAB in the last 72 hours.  Invalid input(s): FREET3   Coagulation profile No results for input(s): INR, PROTIME in the last 168  hours. ------------------------------------------------------------------------------------------------------------------- No results for input(s): DDIMER in the last 72 hours. -------------------------------------------------------------------------------------------------------------------  Cardiac Enzymes No results for input(s): CKMB, TROPONINI, MYOGLOBIN in the last 168 hours.  Invalid input(s): CK ------------------------------------------------------------------------------------------------------------------ Invalid input(s): POCBNP   ---------------------------------------------------------------------------------------------------------------  Urinalysis No results found for: COLORURINE, APPEARANCEUR, LABSPEC, PHURINE, GLUCOSEU, HGBUR, BILIRUBINUR, KETONESUR, PROTEINUR, UROBILINOGEN, NITRITE, LEUKOCYTESUR  ----------------------------------------------------------------------------------------------------------------   Imaging results:   Ct Abdomen Pelvis W Contrast  Result Date: 11/06/2018 CLINICAL DATA:  Rectal bleeding, abdominal pain. EXAM: CT ABDOMEN AND PELVIS WITH CONTRAST TECHNIQUE: Multidetector CT imaging of the abdomen  and pelvis was performed using the standard protocol following bolus administration of intravenous contrast. CONTRAST:  100mL OMNIPAQUE IOHEXOL 300 MG/ML  SOLN COMPARISON:  None. FINDINGS: Lower chest: No acute abnormality. Hepatobiliary: No focal liver abnormality is seen. Status post cholecystectomy. No biliary dilatation. Pancreas: Unremarkable. No pancreatic ductal dilatation or surrounding inflammatory changes. Spleen: Normal in size without focal abnormality. Adrenals/Urinary Tract: Adrenal glands are unremarkable. Kidneys are normal, without renal calculi, focal lesion, or hydronephrosis. Bladder is unremarkable. Stomach/Bowel: The stomach appears normal. There is no evidence of bowel obstruction or inflammation. Sigmoid diverticulosis is noted.  Status post appendectomy. Vascular/Lymphatic: Aortic atherosclerosis. No enlarged abdominal or pelvic lymph nodes. Reproductive: Uterus and bilateral adnexa are unremarkable. Other: No abdominal wall hernia or abnormality. No abdominopelvic ascites. Musculoskeletal: Severe multilevel degenerative disc disease is noted in lower lumbar spine. No acute abnormality is noted. IMPRESSION: Sigmoid diverticulosis without inflammation. Severe multilevel degenerative disc disease in lower lumbar spine. No acute abnormality seen in the abdomen or pelvis. Aortic Atherosclerosis (ICD10-I70.0). Electronically Signed   By: Lupita RaiderJames  Green Jr, M.D.   On: 11/06/2018 11:51   Vas [place] Lower Extremity Venous (dvt)  Result Date: 10/24/2018  Lower Venous Study Indications: Edema.  Risk Factors: Lymphedema. Comparison Study: No prior study Performing Technologist: Sherren Kernsandace Kanady RVS  Examination Guidelines: A complete evaluation includes B-mode imaging, spectral Doppler, color Doppler, and power Doppler as needed of all accessible portions of each vessel. Bilateral testing is considered an integral part of a complete examination. Limited examinations for reoccurring indications may be performed as noted.  Right Venous Findings: +---+---------------+---------+-----------+----------+-------+    CompressibilityPhasicitySpontaneityPropertiesSummary +---+---------------+---------+-----------+----------+-------+ CFVFull           Yes      Yes                          +---+---------------+---------+-----------+----------+-------+ SFJFull                                                 +---+---------------+---------+-----------+----------+-------+  Left Venous Findings: +---------+---------------+---------+-----------+----------+-------+          CompressibilityPhasicitySpontaneityPropertiesSummary +---------+---------------+---------+-----------+----------+-------+ CFV      Full           Yes      Yes                           +---------+---------------+---------+-----------+----------+-------+ SFJ      Full                                                 +---------+---------------+---------+-----------+----------+-------+ FV Prox  Full                                                 +---------+---------------+---------+-----------+----------+-------+ FV Mid   Full                                                 +---------+---------------+---------+-----------+----------+-------+  FV DistalFull                                                 +---------+---------------+---------+-----------+----------+-------+ PFV      Full                                                 +---------+---------------+---------+-----------+----------+-------+ POP      Full           Yes      Yes                          +---------+---------------+---------+-----------+----------+-------+ PTV      Full                                                 +---------+---------------+---------+-----------+----------+-------+ PERO     Full                                                 +---------+---------------+---------+-----------+----------+-------+ GSV      Full                                                 +---------+---------------+---------+-----------+----------+-------+    Summary: Right: No evidence of common femoral vein obstruction. Left: There is no evidence of deep vein thrombosis in the lower extremity.There is no evidence of superficial venous thrombosis.  *See table(s) above for measurements and observations. Electronically signed by Coral Else MD on 10/24/2018 at 5:01:29 PM.    Final     My personal review of EKG: A. fib at 95 bpm with nonspecific T wave changes  Assessment & Plan  GI bleed Hold Xarelto GI consult ordered Follow H/H and transfuse as needed if hemoglobin less than equal to 7.5 N.p.o. after midnight.  Patient had cereal today  A. fib mildly  uncontrolled rate Hydration and hold Xarelto Continue with Cardizem Hold Lasix  Hypothyroidism Continue with Synthroid  HLD; continue with Pravachol        DVT SCDs  AM Labs Ordered, also please review Full Orders  Family Communication: Admission, patients condition and plan of care including tests being ordered have been discussed with the patient and daughter who indicate understanding and agree with the plan and Code Status.  Code Status full code  Disposition Plan: Home  Time spent in minutes : 45 minutes  Condition GUARDED   @SIGNATURE @

## 2018-11-06 NOTE — ED Provider Notes (Signed)
[facility] EMERGENCY DEPARTMENT Provider Note   CSN: [id number] Arrival date & time: 11/06/18  1012     History   Chief Complaint Chief Complaint  Patient presents with  . Rectal Bleeding    HPI Grace Alvarado is a 85 y.o. female with history of A. fib, HLD, HTN, MVP, mitral regurgitation, pulmonary hypertension, CAD presenting for evaluation of acute onset, persistent bright red blood per rectum for 2 days.  She reports 4-6 episodes of loose stools with bright red blood in the commode and some blood upon wiping.  Went to see her PCP yesterday who repeated lab work as the patient recently was admitted for symptomatic anemia and discharged on 10/23/2018.  She is currently anticoagulated on Xarelto.  She did note some left lower quadrant abdominal pain upon her PCPs examination yesterday and does note that she has a remote history of diverticulitis.  She denies nausea, vomiting, urinary symptoms, chest pain, shortness of breath, lightheadedness, or weakness.  No fevers.  No aggravating or alleviating factors.  Was told to come to the ED if her symptoms worsened and as she had another episode of bloody loose stools today came in for evaluation.  The history is provided by the patient.    Past Medical History:  Diagnosis Date  . Atrial fibrillation (HCC)   . Hyperlipidemia   . Hypertension   . Hypothyroidism   . Mitral regurgitation   . MVP (mitral valve prolapse)     Patient Active Problem List   Diagnosis Date Noted  . Rectal bleeding 11/05/2018  . Iron deficiency anemia 11/02/2018  . Lymphedema of left lower extremity 10/22/2018  . B12 deficiency 05/27/2018  . Traumatic hematoma of right knee 05/25/2018  . Dependent edema 05/25/2018  . Cellulitis 05/22/2018  . GERD (gastroesophageal reflux disease) 05/20/2018  . Pulmonary hypertension, primary (HCC) 05/20/2018  . Hyperlipidemia 05/20/2018  . CHF (congestive heart failure) (HCC) 05/20/2018  . Tricuspid  regurgitation 05/17/2018  . Mitral regurgitation 05/17/2018  . Chronic a-fib 05/17/2018  . Essential hypertension 05/17/2018  . Hypothyroid 05/17/2018  . CAD (coronary artery disease) 05/17/2018  . Carotid atherosclerosis, bilateral 05/17/2018    Past Surgical History:  Procedure Laterality Date  . APPENDECTOMY    . c section    . CESAREAN SECTION    . CHOLECYSTECTOMY       OB History   No obstetric history on file.      Home Medications    Prior to Admission medications   Medication Sig Start Date End Date Taking? Authorizing Provider  diltiazem (CARDIZEM SR) 120 MG 12 hr capsule Take 1 capsule (120 mg total) by mouth 2 (two) times daily. 05/17/18   [name], MD  ferrous gluconate (FERGON) 324 MG tablet Take 1 tablet (324 mg total) by mouth 3 (three) times daily with meals. 10/21/18   [name], MD  furosemide (LASIX) 20 MG tablet Take 2 tablets (40 mg total) by mouth daily. 10/29/18   Lewayne Bunting, MD  levothyroxine (SYNTHROID, LEVOTHROID) 75 MCG tablet Take 1 tablet (75 mcg total) by mouth daily before breakfast. 05/17/18   [name], MD  potassium chloride (K-DUR) 10 MEQ tablet Take 1 tablet (10 mEq total) by mouth daily. 10/23/18   Leroy Sea, MD  pravastatin (PRAVACHOL) 10 MG tablet Take 10 mg by mouth daily.  10/25/18   [name], MD  rivaroxaban (XARELTO) 20 MG TABS tablet Take 20 mg by mouth daily with supper.    [provider]    Family History Family History  Problem Relation Age of Onset  . Cancer Mother   . Dementia Mother     Social History Social History   Tobacco Use  . Smoking status: Former Games developermoker  . Smokeless tobacco: Never Used  Substance Use Topics  . Alcohol use: Not Currently  . Drug use: Never     Allergies   Codeine; Penicillins; Vancomycin; Zithromax [azithromycin]; and Latex   Review of Systems Review of Systems  Constitutional: Negative for chills and fever.  Respiratory: Negative for  shortness of breath.   Cardiovascular: Negative for chest pain.  Gastrointestinal: Positive for abdominal pain, blood in stool and diarrhea. Negative for nausea and vomiting.  Genitourinary: Negative for dysuria and hematuria.  All other systems reviewed and are negative.    Physical Exam Updated Vital Signs BP 138/72   Pulse (!) 102   Temp (!) 97.5 F (36.4 C) (Oral)   Resp 15   Ht 5\' 3"  (1.6 m)   Wt 63.5 kg   LMP  (LMP Unknown)   SpO2 99%   BMI 24.80 kg/m   Physical Exam Vitals signs and nursing note reviewed.  Constitutional:      General: She is not in acute distress.    Appearance: She is well-developed.  HENT:     Head: Normocephalic and atraumatic.  Eyes:     General:        Right eye: No discharge.        Left eye: No discharge.     Conjunctiva/sclera: Conjunctivae normal.  Neck:     Musculoskeletal: Normal range of motion and neck supple.     Vascular: No JVD.     Trachea: No tracheal deviation.  Cardiovascular:     Rate and Rhythm: Tachycardia present.     Pulses: Normal pulses.  Pulmonary:     Effort: Pulmonary effort is normal. No respiratory distress.  Abdominal:     General: Abdomen is flat. Bowel sounds are increased. There is no distension.     Palpations: Abdomen is soft.     Tenderness: There is generalized abdominal tenderness and tenderness in the left upper quadrant and left lower quadrant. There is no right CVA tenderness, left CVA tenderness, guarding or rebound.  Genitourinary:    Rectum: Guaiac result positive.     Comments: Examination performed in the presence of a chaperone. Small amount dark stool around the rectum with melanotic stools within the rectal vault  Skin:    General: Skin is warm and dry.     Findings: No erythema.  Neurological:     Mental Status: She is alert.  Psychiatric:        Behavior: Behavior normal.      ED Treatments / Results  Labs (all labs ordered are listed, but only abnormal results are  displayed) Labs Reviewed  BASIC METABOLIC PANEL - Abnormal; Notable for the following components:      Result Value   Glucose, Bld 102 (*)    All other components within normal limits  CBC WITH DIFFERENTIAL/PLATELET - Abnormal; Notable for the following components:   RBC 3.61 (*)    Hemoglobin 10.2 (*)    HCT 33.6 (*)    RDW 18.8 (*)    All other components within normal limits  HEPATIC FUNCTION PANEL - Abnormal; Notable for the following components:   Indirect Bilirubin 0.2 (*)    All other components within normal limits  POC OCCULT BLOOD, ED -  Abnormal; Notable for the following components:   Fecal Occult Bld POSITIVE (*)    All other components within normal limits  LIPASE, BLOOD    EKG None  Radiology Ct Abdomen Pelvis W Contrast  Result Date: 11/06/2018 CLINICAL DATA:  Rectal bleeding, abdominal pain. EXAM: CT ABDOMEN AND PELVIS WITH CONTRAST TECHNIQUE: Multidetector CT imaging of the abdomen and pelvis was performed using the standard protocol following bolus administration of intravenous contrast. CONTRAST:  OMNIPAQUE IOHEXOL 300 MG/ML  SOLN COMPARISON:  None. FINDINGS: Lower chest: No acute abnormality. Hepatobiliary: No focal liver abnormality is seen. Status post cholecystectomy. No biliary dilatation. Pancreas: Unremarkable. No pancreatic ductal dilatation or surrounding inflammatory changes. Spleen: Normal in size without focal abnormality. Adrenals/Urinary Tract: Adrenal glands are unremarkable. Kidneys are normal, without renal calculi, focal lesion, or hydronephrosis. Bladder is unremarkable. Stomach/Bowel: The stomach appears normal. There is no evidence of bowel obstruction or inflammation. Sigmoid diverticulosis is noted. Status post appendectomy. Vascular/Lymphatic: Aortic atherosclerosis. No enlarged abdominal or pelvic lymph nodes. Reproductive: Uterus and bilateral adnexa are unremarkable. Other: No abdominal wall hernia or abnormality. No abdominopelvic  ascites. Musculoskeletal: Severe multilevel degenerative disc disease is noted in lower lumbar spine. No acute abnormality is noted. IMPRESSION: Sigmoid diverticulosis without inflammation. Severe multilevel degenerative disc disease in lower lumbar spine. No acute abnormality seen in the abdomen or pelvis. Aortic Atherosclerosis (ICD10-I70.0). Electronically Signed   By: Lupita Raider, M.D.   On: 11/06/2018 11:51    Procedures Procedures (including critical care time)  Medications Ordered in ED Medications  iohexol (OMNIPAQUE) 300 MG/ML solution 100 mL (100 mLs Intravenous Contrast Given 11/06/18 1112)     Initial Impression / Assessment and Plan / ED Course  I have reviewed the triage vital signs and the nursing notes.  Pertinent labs & imaging results that were available during my care of the patient were reviewed by me and considered in my medical decision making (see chart for details).    Patient presenting for evaluation of acute onset loose melanotic stools.  She is afebrile, mildly tachycardic on examination.  Some tenderness to palpation of the left side of the abdomen with no peritoneal signs.  Recently admitted for symptomatic anemia and received blood transfusion.  Anemia panel was equivocal.  She is already on iron supplementation at home.  She does have some frank rectal bleeding on examination.  Her Hemoccult is positive.  Lab work reviewed by me shows mild drop in hemoglobin from labs obtained yesterday.  No metabolic derangements or renal insufficiency.  CT of the abdomen and pelvis shows diverticulosis with no evidence of diverticulitis.  No acute surgical abdominal pathology noted.  Low-power gastroenterology consulted, they recommend endoscopy for further evaluation and management.  Spoke with Dr. Sharyon Medicus with [facility] who agrees to assume care of patient and bring her into the hospital for further evaluation and management.  Patient seen and evaluated by Dr.  Lockie Mola who agrees with assessment and plan at this time.  Final Clinical Impressions(s) / ED Diagnoses   Final diagnoses:  Lower GI bleed  Left lower quadrant abdominal pain    ED Discharge Orders    None       Jeanie Sewer, PA-C 11/06/18 1338    Virgina Norfolk, DO 11/06/18 1625

## 2018-11-07 ENCOUNTER — Encounter (HOSPITAL_COMMUNITY): Payer: Self-pay | Admitting: *Deleted

## 2018-11-07 DIAGNOSIS — I10 Essential (primary) hypertension: Secondary | ICD-10-CM

## 2018-11-07 LAB — HEMOGLOBIN
HEMOGLOBIN: 9.3 g/dL — AB (ref 12.0–15.0)
Hemoglobin: 8.7 g/dL — ABNORMAL LOW (ref 12.0–15.0)

## 2018-11-07 MED ORDER — PEG-KCL-NACL-NASULF-NA ASC-C 100 G PO SOLR: 1.0000 | Freq: Once | ORAL | Status: DC

## 2018-11-07 MED ORDER — PEG-KCL-NACL-NASULF-NA ASC-C 100 G PO SOLR
0.5000 | Freq: Once | ORAL | Status: AC
  Administered 2018-11-08: 100 g via ORAL

## 2018-11-07 MED ORDER — PEG-KCL-NACL-NASULF-NA ASC-C 100 G PO SOLR
0.5000 | Freq: Once | ORAL | Status: AC
  Administered 2018-11-07: 100 g via ORAL
  Filled 2018-11-07: qty 1

## 2018-11-07 MED ORDER — SODIUM CHLORIDE 0.9 % IV SOLN
510.0000 mg | INTRAVENOUS | Status: AC
  Administered 2018-11-07 – 2018-11-10 (×2): 510 mg via INTRAVENOUS
  Filled 2018-11-07 (×2): qty 17

## 2018-11-07 NOTE — Progress Notes (Signed)
[facility] PROGRESS NOTE    Progress Note  Grace Alvarado  ZOX:[id number] DOB: 07/25/1925 DOA: 11/06/2018 PCP: Pincus SanesBurns, Stacy J, MD     Brief Narrative:   Grace FlorenceMarie Stooksbury is an 85 y.o. female past medical history of atrial fibrillation on Xarelto presents 2 days with frank bright red blood per rectum hemoglobin back in November was 11, on admission 10. Enterology was consulted.  CT scan of the abdomen was on 11/06/2018 showed sigmoid diverticulosis without inflammation.  Assessment/Plan:   Lower GI bleed: Xarelto has been held.  Hemoglobin on admission was 11.1 this morning is 8.7 2 IV lines placed. GI was consulted recommended IV iron.  They recommended an endoscopy and colonoscopy on 11/08/2018. Continue to monitor hemoglobin.  Chronic a-fib Rate controlled continue to hold Xarelto.  Essential hypertension: Hold antihypertensive medication.  Hypothyroid Tinea Synthroid.    DVT prophylaxis: SCD Family Communication:daughter Disposition Plan/Barrier to D/C: home once GI bleed resolved. Code Status:     Code Status Orders  (From admission, onward)         Start     Ordered   11/06/18 1436  Full code  Continuous     11/06/18 1435        Code Status History    Date Active Date Inactive Code Status Order ID Comments User Context   10/22/2018 1723 10/23/2018 1458 DNR [id number]  Jonah BlueYates, Jennifer, MD Inpatient   05/22/2018 0944 05/24/2018 1931 DNR [id number]  Delaine LamePurohit, Shrey C, MD ED        IV Access:    Peripheral IV   Procedures and diagnostic studies:   Ct Abdomen Pelvis W Contrast  Result Date: 11/06/2018 CLINICAL DATA:  Rectal bleeding, abdominal pain. EXAM: CT ABDOMEN AND PELVIS WITH CONTRAST TECHNIQUE: Multidetector CT imaging of the abdomen and pelvis was performed using the standard protocol following bolus administration of intravenous contrast. CONTRAST:  100mL OMNIPAQUE IOHEXOL 300 MG/ML  SOLN COMPARISON:  None. FINDINGS: Lower chest: No acute  abnormality. Hepatobiliary: No focal liver abnormality is seen. Status post cholecystectomy. No biliary dilatation. Pancreas: Unremarkable. No pancreatic ductal dilatation or surrounding inflammatory changes. Spleen: Normal in size without focal abnormality. Adrenals/Urinary Tract: Adrenal glands are unremarkable. Kidneys are normal, without renal calculi, focal lesion, or hydronephrosis. Bladder is unremarkable. Stomach/Bowel: The stomach appears normal. There is no evidence of bowel obstruction or inflammation. Sigmoid diverticulosis is noted. Status post appendectomy. Vascular/Lymphatic: Aortic atherosclerosis. No enlarged abdominal or pelvic lymph nodes. Reproductive: Uterus and bilateral adnexa are unremarkable. Other: No abdominal wall hernia or abnormality. No abdominopelvic ascites. Musculoskeletal: Severe multilevel degenerative disc disease is noted in lower lumbar spine. No acute abnormality is noted. IMPRESSION: Sigmoid diverticulosis without inflammation. Severe multilevel degenerative disc disease in lower lumbar spine. No acute abnormality seen in the abdomen or pelvis. Aortic Atherosclerosis (ICD10-I70.0). Electronically Signed   By: Lupita RaiderJames  Green Jr, M.D.   On: 11/06/2018 11:51     Medical Consultants:    None.  Anti-Infectives:   None  Subjective:    Grace FlorenceMarie Chachere no complaints feels great.  Objective:    Vitals:   11/06/18 1248 11/06/18 1514 11/06/18 2131 11/07/18 0447  BP: 138/72 (!) 114/45 128/60 (!) 106/53  Pulse: (!) 102 96 95 73  Resp: 15  20 20   Temp:  98.6 F (37 C) 98.4 F (36.9 C) 98.5 F (36.9 C)  TempSrc:  Oral Oral Oral  SpO2: 99% 100% 99% 98%  Weight:  64.5 kg 64.1 kg   Height:  Intake/Output Summary (Last 24 hours) at 11/07/2018 0756 Last data filed at 11/07/2018 0600 Gross per 24 hour  Intake 1520.27 ml  Output -  Net 1520.27 ml   Filed Weights   11/06/18 1022 11/06/18 1514 11/06/18 2131  Weight: 63.5 kg 64.5 kg 64.1 kg     Exam: General exam: In no acute distress. Respiratory system: Good air movement and clear to auscultation. Cardiovascular system: S1 & S2 heard, RRR.  Gastrointestinal system: Abdomen is nondistended, soft and nontender.  Central nervous system: Alert and oriented. No focal neurological deficits. Extremities: No pedal edema. Skin: No rashes, lesions or ulcers Psychiatry: Judgement and insight appear normal. Mood & affect appropriate.    Data Reviewed:    Labs: Basic Metabolic Panel: Recent Labs  Lab 11/05/18 0946 11/06/18 1026  NA 137 136  K 4.3 3.6  CL 97 100  CO2 25 26  GLUCOSE 102* 102*  BUN 23 16  CREATININE 0.73 0.75  CALCIUM 10.1 9.6   GFR Estimated Creatinine Clearance: 41.3 mL/min (by C-G formula based on SCr of 0.75 mg/dL). Liver Function Tests: Recent Labs  Lab 11/06/18 1026  AST 28  ALT 19  ALKPHOS 42  BILITOT 0.4  PROT 7.4  ALBUMIN 3.9   Recent Labs  Lab 11/06/18 1026  LIPASE 26   No results for input(s): AMMONIA in the last 168 hours. Coagulation profile No results for input(s): INR, PROTIME in the last 168 hours.  CBC: Recent Labs  Lab 11/05/18 1556 11/06/18 1026 11/06/18 1458 11/06/18 2205 11/07/18 0459  WBC 6.3 8.6  --   --   --   NEUTROABS 4.0 6.6  --   --   --   HGB 11.1* 10.2* 9.0* 8.1* 8.7*  HCT 34.2* 33.6*  --   --   --   MCV 88.4 93.1  --   --   --   PLT 173.0 178  --   --   --    Cardiac Enzymes: No results for input(s): CKTOTAL, CKMB, CKMBINDEX, TROPONINI in the last 168 hours. BNP (last 3 results) Recent Labs    10/21/18 0843  PROBNP 426.0*   CBG: No results for input(s): GLUCAP in the last 168 hours. D-Dimer: No results for input(s): DDIMER in the last 72 hours. Hgb A1c: No results for input(s): HGBA1C in the last 72 hours. Lipid Profile: No results for input(s): CHOL, HDL, LDLCALC, TRIG, CHOLHDL, LDLDIRECT in the last 72 hours. Thyroid function studies: No results for input(s): TSH, T4TOTAL, T3FREE,  THYROIDAB in the last 72 hours.  Invalid input(s): FREET3 Anemia work up: No results for input(s): VITAMINB12, FOLATE, FERRITIN, TIBC, IRON, RETICCTPCT in the last 72 hours. Sepsis Labs: Recent Labs  Lab 11/05/18 1556 11/06/18 1026  WBC 6.3 8.6   Microbiology No results found for this or any previous visit (from the past 240 hour(s)).   Medications:   . diltiazem  120 mg Oral BID  . ferrous gluconate  324 mg Oral TID WC  . levothyroxine  75 mcg Oral QAC breakfast  . pantoprazole (PROTONIX) IV  40 mg Intravenous Q12H  . potassium chloride  10 mEq Oral Daily  . pravastatin  10 mg Oral Q2000   Continuous Infusions: . sodium chloride 75 mL/hr at 11/06/18 1600      LOS: 1 day   Marinda Elkbraham Feliz Ortiz  [facility]  11/07/2018, 7:56 AM

## 2018-11-07 NOTE — Progress Notes (Signed)
[facility] Progress Note   Chief Complaint:   Rectal bleeding / anemia   SUBJECTIVE:    Having some diffuse lower abdominal cramping, feels like pre-defecatory cramping   ASSESSMENT AND PLAN:   83.  85 year old female with iron deficiency anemia (ferritin 6.5 last month) on Xarelto. Required unit of pRBCs last month for symptomatic anemia in absence of GI bleeding. Now with new onset painless rectal bleeding with BMs a few days ago (now resolved). Hgb continues to decline ( 11 >> 10 >> 9.0 >> 8.7 today).  -Xarelto is on hold -Plan is for colonoscopy tomorrow.  Anemia is recurrent (first occurred last month)  but she just started having overt rectal bleeding. Bleeding could be hemorrhoidal with true cause of iron deficiency anemia being in upper GI tract therefore will consent for EGD as well. -trend hgb, transfuse if needed   2. Atrial Fibrillation, home Xarelto on hold  3.  Mild lower abdominal cramping.  Feels like pre-deprecatory cramp but only passed small BM this morning.  No blood.  Assured patient that she would be moving her bowels soon with bowel prep   OBJECTIVE:     Vital signs in last 24 hours: Temp:  [98 F (36.7 C)-98.6 F (37 C)] 98 F (36.7 C) (02/02 0941) Pulse Rate:  [73-102] 80 (02/02 0941) Resp:  [15-20] 18 (02/02 0941) BP: (106-138)/(45-72) 120/55 (02/02 0941) SpO2:  [98 %-100 %] 100 % (02/02 0941) Weight:  [64.1 kg-64.5 kg] 64.1 kg (02/01 2131) Last BM Date: 11/06/18 General:   Alert, female in NAD EENT:  Normal hearing, non icteric sclera, conjunctive pink.  Heart:  Regular rate, irreg rhythm. Trace RLE edema  Pulm: Normal respiratory effort, lungs CTA bilaterally without wheezes or crackles. Abdomen:  Soft, nondistended, nontender.  Normal bowel sounds, no masses felt.       Neurologic:  Alert and  oriented x4;  grossly normal neurologically. Psych:  Pleasant, cooperative.  Normal mood and affect.   Intake/Output from previous  day: 02/01 0701 - 02/02 0700 In: 1520.3 [P.O.:480; I.V.:1040.3] Out: -  Intake/Output this shift: Total I/O In: 360 [P.O.:360] Out: 3 [Urine:1; Emesis/NG output:1; Stool:1]  Lab Results: Recent Labs    11/05/18 1556 11/06/18 1026 11/06/18 1458 11/06/18 2205 11/07/18 0459  WBC 6.3 8.6  --   --   --   HGB 11.1* 10.2* 9.0* 8.1* 8.7*  HCT 34.2* 33.6*  --   --   --   PLT 173.0 178  --   --   --    BMET Recent Labs    11/05/18 0946 11/06/18 1026  NA 137 136  K 4.3 3.6  CL 97 100  CO2 25 26  GLUCOSE 102* 102*  BUN 23 16  CREATININE 0.73 0.75  CALCIUM 10.1 9.6   LFT Recent Labs    11/06/18 1026  PROT 7.4  ALBUMIN 3.9  AST 28  ALT 19  ALKPHOS 42  BILITOT 0.4  BILIDIR 0.2  IBILI 0.2*    Ct Abdomen Pelvis W Contrast  Result Date: 11/06/2018 CLINICAL DATA:  Rectal bleeding, abdominal pain. EXAM: CT ABDOMEN AND PELVIS WITH CONTRAST TECHNIQUE: Multidetector CT imaging of the abdomen and pelvis was performed using the standard protocol following bolus administration of intravenous contrast. CONTRAST:  OMNIPAQUE IOHEXOL 300 MG/ML  SOLN COMPARISON:  None. FINDINGS: Lower chest: No acute abnormality. Hepatobiliary: No focal liver abnormality is seen. Status post cholecystectomy. No biliary dilatation. Pancreas: Unremarkable. No pancreatic ductal dilatation or  surrounding inflammatory changes. Spleen: Normal in size without focal abnormality. Adrenals/Urinary Tract: Adrenal glands are unremarkable. Kidneys are normal, without renal calculi, focal lesion, or hydronephrosis. Bladder is unremarkable. Stomach/Bowel: The stomach appears normal. There is no evidence of bowel obstruction or inflammation. Sigmoid diverticulosis is noted. Status post appendectomy. Vascular/Lymphatic: Aortic atherosclerosis. No enlarged abdominal or pelvic lymph nodes. Reproductive: Uterus and bilateral adnexa are unremarkable. Other: No abdominal wall hernia or abnormality. No abdominopelvic ascites.  Musculoskeletal: Severe multilevel degenerative disc disease is noted in lower lumbar spine. No acute abnormality is noted. IMPRESSION: Sigmoid diverticulosis without inflammation. Severe multilevel degenerative disc disease in lower lumbar spine. No acute abnormality seen in the abdomen or pelvis. Aortic Atherosclerosis (ICD10-I70.0). Electronically Signed   By: Lupita Raider, M.D.   On: 11/06/2018 11:51    Active Problems:   Mitral regurgitation   Chronic a-fib   Essential hypertension   Hypothyroid   Lower GI bleed     LOS: 1 day   Willette Cluster ,NP 11/07/2018, 12:04 PM

## 2018-11-07 NOTE — Progress Notes (Signed)
MEDICATION RELATED CONSULT NOTE - INITIAL   Pharmacy Consult for IV iron Indication: iron deficiency anemia  Allergies  Allergen Reactions  . Codeine Other (See Comments)    unknown  . Penicillins Other (See Comments)    unknown  . Vancomycin Other (See Comments)    unknown  . Zithromax [Azithromycin] Other (See Comments)    unknown  . Latex Rash    Patient Measurements: Height: 5\' 3"  (160 cm) Weight: 141 lb 5 oz (64.1 kg) IBW/kg (Calculated) : 52.4  Vital Signs: Temp: 98 F (36.7 C) (02/02 0941) Temp Source: Oral (02/02 0941) BP: 120/55 (02/02 0941) Pulse Rate: 80 (02/02 0941) Intake/Output from previous day: 02/01 0701 - 02/02 0700 In: 1520.3 [P.O.:480; I.V.:1040.3] Out: -  Intake/Output from this shift: Total I/O In: 840 [P.O.:840] Out: 5 [Urine:2; Emesis/NG output:1; Stool:2]  Labs: Recent Labs    11/05/18 0946  11/05/18 1556 11/06/18 1026 11/06/18 1458 11/06/18 2205 11/07/18 0459  WBC  --   --  6.3 8.6  --   --   --   HGB  --    < > 11.1* 10.2* 9.0* 8.1* 8.7*  HCT  --   --  34.2* 33.6*  --   --   --   PLT  --   --  173.0 178  --   --   --   CREATININE 0.73  --   --  0.75  --   --   --   ALBUMIN  --   --   --  3.9  --   --   --   PROT  --   --   --  7.4  --   --   --   AST  --   --   --  28  --   --   --   ALT  --   --   --  19  --   --   --   ALKPHOS  --   --   --  42  --   --   --   BILITOT  --   --   --  0.4  --   --   --   BILIDIR  --   --   --  0.2  --   --   --   IBILI  --   --   --  0.2*  --   --   --    < > = values in this interval not displayed.   Estimated Creatinine Clearance: 41.3 mL/min (by C-G formula based on SCr of 0.75 mg/dL).   Microbiology: No results found for this or any previous visit (from the past 720 hour(s)).  Medical History: Past Medical History:  Diagnosis Date  . Atrial fibrillation (HCC)   . Hyperlipidemia   . Hypertension   . Hypothyroidism   . Mitral regurgitation   . MVP (mitral valve prolapse)      Assessment: Grace Alvarado is a 85yo female presenting with iron deficiency anemia. Last ferritin recorded was 6.5 last month. Pharmacy consulted for IV iron dosing.   Goal of Therapy:  Hgb >12  Plan:  Feraheme 510mg  q72hours x2 doses Monitor CBC  Thank you for involving pharmacy in this patient's care.  Wendelyn Breslow, PharmD PGY1 Pharmacy Resident Phone: [phone] 11/07/2018 2:20 PM

## 2018-11-08 ENCOUNTER — Encounter (HOSPITAL_COMMUNITY): Payer: Self-pay | Admitting: Certified Registered"

## 2018-11-08 ENCOUNTER — Encounter (HOSPITAL_COMMUNITY)
Admission: EM | Disposition: A | Discharge status: Home / Self Care | Payer: Self-pay | Source: Home / Self Care | Attending: Internal Medicine

## 2018-11-08 DIAGNOSIS — D508 Other iron deficiency anemias: Secondary | ICD-10-CM

## 2018-11-08 LAB — HEMOGLOBIN
Hemoglobin: 9.5 g/dL — ABNORMAL LOW (ref 12.0–15.0)
Hemoglobin: 8.5 g/dL — ABNORMAL LOW (ref 12.0–15.0)

## 2018-11-08 SURGERY — CANCELLED PROCEDURE

## 2018-11-08 MED ORDER — LACTATED RINGERS IV SOLN
INTRAVENOUS | Status: DC
  Administered 2018-11-08: 15:00:00 via INTRAVENOUS

## 2018-11-08 MED ORDER — MIDAZOLAM HCL (PF) 5 MG/ML IJ SOLN
INTRAMUSCULAR | Status: AC
  Filled 2018-11-08: qty 2

## 2018-11-08 MED ORDER — POLYETHYLENE GLYCOL 3350 17 GM/SCOOP PO POWD
1.0000 | Freq: Once | ORAL | Status: AC
  Administered 2018-11-08: 255 g via ORAL
  Filled 2018-11-08: qty 255

## 2018-11-08 MED ORDER — FENTANYL CITRATE (PF) 100 MCG/2ML IJ SOLN
INTRAMUSCULAR | Status: AC
  Filled 2018-11-08: qty 2

## 2018-11-08 SURGICAL SUPPLY — 22 items

## 2018-11-08 NOTE — Progress Notes (Signed)
Patient rescheduled for Colon/EGD tomorrow @ [phone]. Bedside RN is aware.

## 2018-11-08 NOTE — Anesthesia Preprocedure Evaluation (Deleted)
Anesthesia Evaluation  Patient identified by MRN, date of birth, ID band Patient awake    Reviewed: Allergy & Precautions, NPO status , Patient's Chart, lab work & pertinent test results  Airway Mallampati: II  TM Distance: >3 FB Neck ROM: Full    Dental  (+) Edentulous Upper, Edentulous Lower, Dental Advisory Given   Pulmonary former smoker,    Pulmonary exam normal        Cardiovascular hypertension, + CAD and +CHF  + dysrhythmias Atrial Fibrillation  Rhythm:Irregular Rate:Normal     Neuro/Psych negative neurological ROS  negative psych ROS   GI/Hepatic Neg liver ROS, GERD  ,  Endo/Other  Hypothyroidism   Renal/GU negative Renal ROS  negative genitourinary   Musculoskeletal negative musculoskeletal ROS (+)   Abdominal   Peds negative pediatric ROS (+)  Hematology negative hematology ROS (+)   Anesthesia Other Findings   Reproductive/Obstetrics negative OB ROS                             Anesthesia Physical Anesthesia Plan  ASA: III  Anesthesia Plan: MAC   Post-op Pain Management:    Induction:   PONV Risk Score and Plan: 2 and Ondansetron and Propofol infusion  Airway Management Planned: Simple Face Mask  Additional Equipment:   Intra-op Plan:   Post-operative Plan:   Informed Consent: I have reviewed the patients History and Physical, chart, labs and discussed the procedure including the risks, benefits and alternatives for the proposed anesthesia with the patient or authorized representative who has indicated his/her understanding and acceptance.     Dental advisory given  Plan Discussed with: CRNA and Anesthesiologist  Anesthesia Plan Comments:         Anesthesia Quick Evaluation

## 2018-11-08 NOTE — Progress Notes (Signed)
          Daily Rounding Note  11/08/2018, 11:40 AM  LOS: 2 days   SUBJECTIVE:   Chief complaint: Iron deficiency anemia.  Recent rectal bleeding.    Patient completed prep.  She is seeing some brownish water when she passes.  No solid stool.  OBJECTIVE:         Vital signs in last 24 hours:    Temp:  [97.4 F (36.3 C)-99.2 F (37.3 C)] 98.2 F (36.8 C) (02/03 0913) Pulse Rate:  [89-103] 89 (02/03 0913) Resp:  [18-21] 20 (02/03 0913) BP: (107-127)/(43-56) 127/53 (02/03 0913) SpO2:  [97 %-100 %] 99 % (02/03 0913) Weight:  [66.3 kg] 66.3 kg (02/02 2132) Last BM Date: 11/08/18 Filed Weights   11/06/18 1514 11/06/18 2131 11/07/18 2132  Weight: 64.5 kg 64.1 kg 66.3 kg   General: Pleasant, cooperative. Heart: Regularly irregular.  Rate in 80s Chest: Clear bilaterally.  No labored breathing or cough. Abdomen: Soft.  Not tender or distended.  Bowel sounds active. Extremities: No CCE. Neuro/Psych: Alert.  Oriented x3.  Fluid speech.  No gross deficits.  Intake/Output from previous day: 02/02 0701 - 02/03 0700 In: 2820 [P.O.:1320; I.V.:1500] Out: 6 [Urine:3; Emesis/NG output:1; Stool:2]  Intake/Output this shift: No intake/output data recorded.  Lab Results: Recent Labs    11/05/18 1556 11/06/18 1026  11/07/18 0459 11/07/18 2053 11/08/18 0637  WBC 6.3 8.6  --   --   --   --   HGB 11.1* 10.2*   < > 8.7* 9.3* 9.5*  HCT 34.2* 33.6*  --   --   --   --   PLT 173.0 178  --   --   --   --    < > = values in this interval not displayed.   BMET Recent Labs    11/06/18 1026  NA 136  K 3.6  CL 100  CO2 26  GLUCOSE 102*  BUN 16  CREATININE 0.75  CALCIUM 9.6   LFT Recent Labs    11/06/18 1026  PROT 7.4  ALBUMIN 3.9  AST 28  ALT 19  ALKPHOS 42  BILITOT 0.4  BILIDIR 0.2  IBILI 0.2*   PT/INR No results for input(s): LABPROT, INR in the last 72 hours. Hepatitis Panel No results for input(s): HEPBSAG, HCVAB,  HEPAIGM, HEPBIGM in the last 72 hours.  Studies/Results: No results found.  ASSESMENT:   *  IDA requiring PRBCs in Jan 2020.  On oral iron at home.   Recent bleeding PR.  Some mild abd cramping pain.  CT 11/06/08 shows sigmoid diverticulosis.    *   Afib.  Chronic Xarelto on hold.  Last dose: 1/31.      PLAN   *    Plan is for EGD and colonoscopy today.  Patient is prepped and ready to go.  Schedule is tight so timing of procedure not yet defined but hopefully sometime this afternoon    Jennye Moccasin  11/08/2018, 11:40 AM Phone [phone]

## 2018-11-08 NOTE — H&P (View-Only) (Signed)
          Daily Rounding Note  11/08/2018, 11:40 AM  LOS: 2 days   SUBJECTIVE:   Chief complaint: Iron deficiency anemia.  Recent rectal bleeding.    Patient completed prep.  She is seeing some brownish water when she passes.  No solid stool.  OBJECTIVE:         Vital signs in last 24 hours:    Temp:  [97.4 F (36.3 C)-99.2 F (37.3 C)] 98.2 F (36.8 C) (02/03 0913) Pulse Rate:  [89-103] 89 (02/03 0913) Resp:  [18-21] 20 (02/03 0913) BP: (107-127)/(43-56) 127/53 (02/03 0913) SpO2:  [97 %-100 %] 99 % (02/03 0913) Weight:  [66.3 kg] 66.3 kg (02/02 2132) Last BM Date: 11/08/18 Filed Weights   11/06/18 1514 11/06/18 2131 11/07/18 2132  Weight: 64.5 kg 64.1 kg 66.3 kg   General: Pleasant, cooperative. Heart: Regularly irregular.  Rate in 80s Chest: Clear bilaterally.  No labored breathing or cough. Abdomen: Soft.  Not tender or distended.  Bowel sounds active. Extremities: No CCE. Neuro/Psych: Alert.  Oriented x3.  Fluid speech.  No gross deficits.  Intake/Output from previous day: 02/02 0701 - 02/03 0700 In: 2820 [P.O.:1320; I.V.:1500] Out: 6 [Urine:3; Emesis/NG output:1; Stool:2]  Intake/Output this shift: No intake/output data recorded.  Lab Results: Recent Labs    11/05/18 1556 11/06/18 1026  11/07/18 0459 11/07/18 2053 11/08/18 0637  WBC 6.3 8.6  --   --   --   --   HGB 11.1* 10.2*   < > 8.7* 9.3* 9.5*  HCT 34.2* 33.6*  --   --   --   --   PLT 173.0 178  --   --   --   --    < > = values in this interval not displayed.   BMET Recent Labs    11/06/18 1026  NA 136  K 3.6  CL 100  CO2 26  GLUCOSE 102*  BUN 16  CREATININE 0.75  CALCIUM 9.6   LFT Recent Labs    11/06/18 1026  PROT 7.4  ALBUMIN 3.9  AST 28  ALT 19  ALKPHOS 42  BILITOT 0.4  BILIDIR 0.2  IBILI 0.2*   PT/INR No results for input(s): LABPROT, INR in the last 72 hours. Hepatitis Panel No results for input(s): HEPBSAG, HCVAB,  HEPAIGM, HEPBIGM in the last 72 hours.  Studies/Results: No results found.  ASSESMENT:   *  IDA requiring PRBCs in Jan 2020.  On oral iron at home.   Recent bleeding PR.  Some mild abd cramping pain.  CT 11/06/08 shows sigmoid diverticulosis.    *   Afib.  Chronic Xarelto on hold.  Last dose: 1/31.      PLAN   *    Plan is for EGD and colonoscopy today.  Patient is prepped and ready to go.  Schedule is tight so timing of procedure not yet defined but hopefully sometime this afternoon    Grace Alvarado  11/08/2018, 11:40 AM Phone [phone] 

## 2018-11-08 NOTE — Progress Notes (Addendum)
[facility] PROGRESS NOTE    Progress Note  Grace Alvarado  QDI:[id number] DOB: [birth year] DOA: 11/06/2018 PCP: Pincus Sanes, MD     Brief Narrative:   Grace Alvarado is an 85 y.o. female past medical history of atrial fibrillation on Xarelto presents 2 days with frank bright red blood per rectum hemoglobin back in November was 11, on admission 10. Enterology was consulted.  CT scan of the abdomen was on 11/06/2018 showed sigmoid diverticulosis without inflammation.  Assessment/Plan:   Lower GI bleed: Xarelto has been held.  Hemoglobin on admission was 11.1 this morning is 9.5 GI recommended EGD and colonoscopy on 11/08/2018. No further bloody bowel movements she did her home prep overnight. Patient was to be a full code during the procedure, if this means that she would have to be intubated transiently.  Chronic a-fib Rate controlled continue to hold Xarelto.  Essential hypertension: Hold antihypertensive medication.  Hypothyroid Tinea Synthroid.    DVT prophylaxis: SCD Family Communication:daughter Disposition Plan/Barrier to D/C: home once GI bleed resolved. Code Status:     Code Status Orders  (From admission, onward)         Start     Ordered   11/06/18 1436  Full code  Continuous     11/06/18 1435        Code Status History    Date Active Date Inactive Code Status Order ID Comments User Context   10/22/2018 1723 10/23/2018 1458 DNR [id number]  Jonah Blue, MD Inpatient   05/22/2018 0944 05/24/2018 1931 DNR [id number]  Delaine Lame, MD ED        IV Access:    Peripheral IV   Procedures and diagnostic studies:   Ct Abdomen Pelvis W Contrast  Result Date: 11/06/2018 CLINICAL DATA:  Rectal bleeding, abdominal pain. EXAM: CT ABDOMEN AND PELVIS WITH CONTRAST TECHNIQUE: Multidetector CT imaging of the abdomen and pelvis was performed using the standard protocol following bolus administration of intravenous contrast. CONTRAST:  OMNIPAQUE  IOHEXOL 300 MG/ML  SOLN COMPARISON:  None. FINDINGS: Lower chest: No acute abnormality. Hepatobiliary: No focal liver abnormality is seen. Status post cholecystectomy. No biliary dilatation. Pancreas: Unremarkable. No pancreatic ductal dilatation or surrounding inflammatory changes. Spleen: Normal in size without focal abnormality. Adrenals/Urinary Tract: Adrenal glands are unremarkable. Kidneys are normal, without renal calculi, focal lesion, or hydronephrosis. Bladder is unremarkable. Stomach/Bowel: The stomach appears normal. There is no evidence of bowel obstruction or inflammation. Sigmoid diverticulosis is noted. Status post appendectomy. Vascular/Lymphatic: Aortic atherosclerosis. No enlarged abdominal or pelvic lymph nodes. Reproductive: Uterus and bilateral adnexa are unremarkable. Other: No abdominal wall hernia or abnormality. No abdominopelvic ascites. Musculoskeletal: Severe multilevel degenerative disc disease is noted in lower lumbar spine. No acute abnormality is noted. IMPRESSION: Sigmoid diverticulosis without inflammation. Severe multilevel degenerative disc disease in lower lumbar spine. No acute abnormality seen in the abdomen or pelvis. Aortic Atherosclerosis (ICD10-I70.0). Electronically Signed   By: Lupita Raider, M.D.   On: 11/06/2018 11:51     Medical Consultants:    None.  Anti-Infectives:   None  Subjective:    Grace Alvarado no complaints feels great in a good mood this morning.  Objective:    Vitals:   11/07/18 0941 11/07/18 1637 11/07/18 2132 11/08/18 0402  BP: (!) 120/55 (!) 107/46 (!) 118/56 (!) 108/43  Pulse: 80 (!) 103 (!) 103 (!) 103  Resp: 18 18 20  (!) 21  Temp: 98 F (36.7 C) 99.2 F (37.3 C) (!) 97.4 F (36.3 C)  98.3 F (36.8 C)  TempSrc: Oral Oral Oral Oral  SpO2: 100% 97% 100% 98%  Weight:   66.3 kg   Height:        Intake/Output Summary (Last 24 hours) at 11/08/2018 0851 Last data filed at 11/08/2018 0657 Gross per 24 hour  Intake 2820 ml    Output 6 ml  Net 2814 ml   Filed Weights   11/06/18 1514 11/06/18 2131 11/07/18 2132  Weight: 64.5 kg 64.1 kg 66.3 kg    Exam: General exam: In no acute distress. Respiratory system: Good air movement and clear to auscultation. Cardiovascular system: S1 & S2 heard, RRR.  Gastrointestinal system: Abdomen is nondistended, soft and nontender.  Central nervous system: Alert and oriented. No focal neurological deficits. Extremities: No pedal edema. Skin: No rashes, lesions or ulcers Psychiatry: Judgement and insight appear normal. Mood & affect appropriate.    Data Reviewed:    Labs: Basic Metabolic Panel: Recent Labs  Lab 11/05/18 0946 11/06/18 1026  NA 137 136  K 4.3 3.6  CL 97 100  CO2 25 26  GLUCOSE 102* 102*  BUN 23 16  CREATININE 0.73 0.75  CALCIUM 10.1 9.6   GFR Estimated Creatinine Clearance: 41.9 mL/min (by C-G formula based on SCr of 0.75 mg/dL). Liver Function Tests: Recent Labs  Lab 11/06/18 1026  AST 28  ALT 19  ALKPHOS 42  BILITOT 0.4  PROT 7.4  ALBUMIN 3.9   Recent Labs  Lab 11/06/18 1026  LIPASE 26   No results for input(s): AMMONIA in the last 168 hours. Coagulation profile No results for input(s): INR, PROTIME in the last 168 hours.  CBC: Recent Labs  Lab 11/05/18 1556 11/06/18 1026 11/06/18 1458 11/06/18 2205 11/07/18 0459 11/07/18 2053 11/08/18 0637  WBC 6.3 8.6  --   --   --   --   --   NEUTROABS 4.0 6.6  --   --   --   --   --   HGB 11.1* 10.2* 9.0* 8.1* 8.7* 9.3* 9.5*  HCT 34.2* 33.6*  --   --   --   --   --   MCV 88.4 93.1  --   --   --   --   --   PLT 173.0 178  --   --   --   --   --    Cardiac Enzymes: No results for input(s): CKTOTAL, CKMB, CKMBINDEX, TROPONINI in the last 168 hours. BNP (last 3 results) Recent Labs    10/21/18 0843  PROBNP 426.0*   CBG: No results for input(s): GLUCAP in the last 168 hours. D-Dimer: No results for input(s): DDIMER in the last 72 hours. Hgb A1c: No results for input(s):  HGBA1C in the last 72 hours. Lipid Profile: No results for input(s): CHOL, HDL, LDLCALC, TRIG, CHOLHDL, LDLDIRECT in the last 72 hours. Thyroid function studies: No results for input(s): TSH, T4TOTAL, T3FREE, THYROIDAB in the last 72 hours.  Invalid input(s): FREET3 Anemia work up: No results for input(s): VITAMINB12, FOLATE, FERRITIN, TIBC, IRON, RETICCTPCT in the last 72 hours. Sepsis Labs: Recent Labs  Lab 11/05/18 1556 11/06/18 1026  WBC 6.3 8.6   Microbiology No results found for this or any previous visit (from the past 240 hour(s)).   Medications:   . diltiazem  120 mg Oral BID  . levothyroxine  75 mcg Oral QAC breakfast  . pantoprazole (PROTONIX) IV  40 mg Intravenous Q12H  . potassium chloride  10 mEq Oral Daily  .  pravastatin  10 mg Oral Q2000   Continuous Infusions: . sodium chloride 75 mL/hr at 11/08/18 0345  . ferumoxytol 510 mg (11/07/18 1527)      LOS: 2 days   Marinda ElkAbraham Feliz Ortiz  [facility]  11/08/2018, 8:51 AM

## 2018-11-09 ENCOUNTER — Ambulatory Visit: Payer: Medicare Other

## 2018-11-09 ENCOUNTER — Encounter (HOSPITAL_COMMUNITY): Payer: Self-pay | Admitting: Gastroenterology

## 2018-11-09 ENCOUNTER — Inpatient Hospital Stay (HOSPITAL_COMMUNITY): Payer: Medicare Other | Admitting: Certified Registered Nurse Anesthetist

## 2018-11-09 ENCOUNTER — Encounter (HOSPITAL_COMMUNITY)
Admission: EM | Disposition: A | Discharge status: Home / Self Care | Payer: Self-pay | Source: Home / Self Care | Attending: Internal Medicine

## 2018-11-09 DIAGNOSIS — K449 Diaphragmatic hernia without obstruction or gangrene: Secondary | ICD-10-CM

## 2018-11-09 DIAGNOSIS — K6289 Other specified diseases of anus and rectum: Secondary | ICD-10-CM

## 2018-11-09 HISTORY — PX: SUBMUCOSAL INJECTION: SHX5543

## 2018-11-09 HISTORY — PX: ESOPHAGOGASTRODUODENOSCOPY (EGD) WITH PROPOFOL: SHX5813

## 2018-11-09 HISTORY — PX: COLONOSCOPY WITH PROPOFOL: SHX5780

## 2018-11-09 HISTORY — PX: HOT HEMOSTASIS: SHX5433

## 2018-11-09 LAB — HEMOGLOBIN: Hemoglobin: 8.6 g/dL — ABNORMAL LOW (ref 12.0–15.0)

## 2018-11-09 SURGERY — COLONOSCOPY WITH PROPOFOL
Anesthesia: Monitor Anesthesia Care

## 2018-11-09 MED ORDER — LIDOCAINE 2% (20 MG/ML) 5 ML SYRINGE
INTRAMUSCULAR | Status: DC | PRN
  Administered 2018-11-09: 60 mg via INTRAVENOUS

## 2018-11-09 MED ORDER — LACTATED RINGERS IV SOLN
INTRAVENOUS | Status: DC
  Administered 2018-11-09: 09:00:00 via INTRAVENOUS

## 2018-11-09 MED ORDER — EPINEPHRINE PF 1 MG/10ML IJ SOSY
PREFILLED_SYRINGE | INTRAMUSCULAR | Status: AC
  Filled 2018-11-09: qty 10

## 2018-11-09 MED ORDER — PROPOFOL 500 MG/50ML IV EMUL
INTRAVENOUS | Status: DC | PRN
  Administered 2018-11-09: 50 ug/kg/min via INTRAVENOUS

## 2018-11-09 SURGICAL SUPPLY — 25 items

## 2018-11-09 NOTE — Anesthesia Procedure Notes (Signed)
Procedure Name: MAC Date/Time: 11/09/2018 10:26 AM Performed by: Alain Marion, CRNA Pre-anesthesia Checklist: Patient identified, Emergency Drugs available, Suction available, Patient being monitored and Timeout performed Oxygen Delivery Method: Simple face mask Placement Confirmation: positive ETCO2

## 2018-11-09 NOTE — Op Note (Signed)
[facility]: [facility] Alvar[facility] Procedure Date : 11/09/2018 MRN: [id number] Attending MD: Starr LakeHenry L. Myrtie Neitheranis , MD Date of Birth: 15-Mar-1925 CSN: [id number] Age: 85 Admit Type: Inpatient Procedure:                Colonoscopy Indications:              Hematochezia, Acute post hemorrhagic anemia Providers:                Sherilyn CooterHenry L. Myrtie Neitheranis, MD, Dwain SarnaPatricia Ford, RN, Kandice RobinsonsGuillaume                            [place], Technician Referring MD:             [facility] Medicines:                Monitored Anesthesia Care Complications:            No immediate complications. Estimated Blood Loss:     Estimated blood loss was minimal. Procedure:                Pre-Anesthesia Assessment:                           - Prior to the procedure, a History and Physical                            was performed, and patient medications and                            allergies were reviewed. The patient's tolerance of                            previous anesthesia was also reviewed. The risks                            and benefits of the procedure and the sedation                            options and risks were discussed with the patient.                            All questions were answered, and informed consent                            was obtained. Prior Anticoagulants: The patient has                            taken Xarelto (rivaroxaban), last dose was 3 days                            prior to procedure. ASA Grade Assessment: IV - A                            patient with severe systemic disease that is a  constant threat to life. After reviewing the risks                            and benefits, the patient was deemed in                            satisfactory condition to undergo the procedure.                           After obtaining informed consent, the colonoscope                            was passed under direct vision. Throughout the             procedure, the patient's blood pressure, pulse, and                            oxygen saturations were monitored continuously. The                            PCF-H190DL ([id number]) Olympus pediatric colonoscope                            was introduced through the anus and advanced to the                            the cecum, identified by appendiceal orifice and                            ileocecal valve. The colonoscopy was performed                            without difficulty. The patient tolerated the                            procedure well. The quality of the bowel                            preparation was good. The ileocecal valve,                            appendiceal orifice, and rectum were photographed. Scope In: 10:34:27 AM Scope Out: 11:01:30 AM Scope Withdrawal Time: 0 hours 21 minutes 47 seconds  Total Procedure Duration: 0 hours 27 minutes 3 seconds  Findings:      The digital rectal exam findings include decreased sphincter tone.      Multiple diverticula were found in the left colon and right colon.      A single medium-sized angioectasia with bleeding on contact was found in       the proximal ascending colon. Area was successfully injected with 3 mL       of a 1:10,000 solution of epinephrine for hemostasis. Coagulation for       hemostasis and ablation using argon plasma (right colon setting) was       successful. To prevent bleeding post-intervention, two hemostatic clips  were successfully placed (MR conditional). There was no bleeding at the       end of the procedure.      Retroflexion in the rectum was not performed due to anatomy.      The exam was otherwise without abnormality. Impression:               - Decreased sphincter tone found on digital rectal                            exam.                           - Diverticulosis in the left colon and in the right                            colon.                           - A single colonic  angioectasia. Injected. Treated                            with argon plasma coagulation (APC). Clips (MR                            conditional) were placed.                           - The examination was otherwise normal.                           - No specimens collected. Recommendation:           - Return patient to hospital ward for ongoing care.                           - Resume regular diet.                           - Continue present medications.                           - Resume Xarelto (rivaroxaban) at prior dose in 7                            days. Procedure Code(s):        --- Professional ---                           [phone], Colonoscopy, flexible; with control of                            bleeding, any method Diagnosis Code(s):        --- Professional ---                           K62.89, Other specified diseases of anus and rectum  K55.20, Angiodysplasia of colon without hemorrhage                           K92.1, Melena (includes Hematochezia)                           D62, Acute posthemorrhagic anemia                           K57.30, Diverticulosis of large intestine without                            perforation or abscess without bleeding CPT copyright 2018 [employer]. All rights reserved. The codes documented in this report are preliminary and upon coder review may  be revised to meet current compliance requirements. Grace Clingan L. Myrtie Neither, MD 11/09/2018 11:20:48 AM This report has been signed electronically. Number of Addenda: 0

## 2018-11-09 NOTE — Anesthesia Postprocedure Evaluation (Signed)
Anesthesia Post Note  Patient: Grace Alvarado  Procedure(s) Performed: COLONOSCOPY WITH PROPOFOL (N/A ) ESOPHAGOGASTRODUODENOSCOPY (EGD) WITH PROPOFOL (N/A ) HOT HEMOSTASIS (ARGON PLASMA COAGULATION/BICAP) (N/A ) SUBMUCOSAL INJECTION HEMOSTASIS CLIP PLACEMENT     Patient location during evaluation: PACU Anesthesia Type: MAC Level of consciousness: awake and alert Pain management: pain level controlled Vital Signs Assessment: post-procedure vital signs reviewed and stable Respiratory status: spontaneous breathing, nonlabored ventilation and respiratory function stable Cardiovascular status: stable and blood pressure returned to baseline Anesthetic complications: no    Last Vitals:  Vitals:   11/09/18 1130 11/09/18 1140  BP: 109/71 (!) 120/56  Pulse: (!) 121 (!) 119  Resp: (!) 25 19  Temp:    SpO2: 99% 99%    Last Pain:  Vitals:   11/09/18 1130  TempSrc:   PainSc: 0-No pain                 Audry Pili

## 2018-11-09 NOTE — Interval H&P Note (Signed)
History and Physical Interval Note:  11/09/2018 10:09 AM  Grace Alvarado  has presented today for surgery, with the diagnosis of new onset of painless rectal bleeding, IDA  The various methods of treatment have been discussed with the patient and family. After consideration of risks, benefits and other options for treatment, the patient has consented to  Procedure(s): COLONOSCOPY WITH PROPOFOL (N/A) ESOPHAGOGASTRODUODENOSCOPY (EGD) WITH PROPOFOL (N/A) as a surgical intervention .  The patient's history has been reviewed, patient examined, no change in status, stable for surgery.  I have reviewed the patient's chart and labs.  Questions were answered to the patient's satisfaction.    Patient took additional bowel prep, Hgb stable since last eve.  Ready for procedures. Charlie Pitter III

## 2018-11-09 NOTE — Progress Notes (Signed)
[facility] PROGRESS NOTE    Progress Note  Grace Alvarado  BRA:[id number] DOB: 23-Jan-1925 DOA: 11/06/2018 PCP: Pincus Sanes, MD     Brief Narrative:   Grace Alvarado is an 85 y.o. female past medical history of atrial fibrillation on Xarelto presents 2 days with frank bright red blood per rectum hemoglobin back in November was 11, on admission 10. Enterology was consulted.  CT scan of the abdomen was on 11/06/2018 showed sigmoid diverticulosis without inflammation.  Assessment/Plan:   Lower GI bleed: Xarelto held on admission her hemoglobin dropped from 11 to 9.5 GI recommended EGD and colonoscopy on 11/08/2018. That showed telangiectasia in her colon which was cauterized no other sites of possible bleeding. Patient was to be a full code during the procedure, if this means that she would have to be intubated transiently. Hemoglobin remained stable and no further bleeding she could probably be discharged on 11/10/2018  Chronic a-fib Rate controlled, hold Xarelto for 7 days starting 11/09/2018  Essential hypertension: Some antihypertensive medication on 11/10/2018  Hypothyroid Continue Synthroid.  Will have physical therapy evaluate the patient.    DVT prophylaxis: SCD Family Communication:daughter Disposition Plan/Barrier to D/C: home once GI bleed resolved. Code Status:     Code Status Orders  (From admission, onward)         Start     Ordered   11/06/18 1436  Full code  Continuous     11/06/18 1435        Code Status History    Date Active Date Inactive Code Status Order ID Comments User Context   10/22/2018 1723 10/23/2018 1458 DNR [id number]  Jonah Blue, MD Inpatient   05/22/2018 0944 05/24/2018 1931 DNR [id number]  Purohit, Salli Quarry, MD ED        IV Access:    Peripheral IV   Procedures and diagnostic studies:   No results found.   Medical Consultants:    None.  Anti-Infectives:   None  Subjective:    Grace Alvarado no complaints feels  great.  Objective:    Vitals:   11/09/18 0848 11/09/18 1120 11/09/18 1130 11/09/18 1140  BP: (!) 130/55 113/79 109/71 (!) 120/56  Pulse: (!) 105 (!) 111 (!) 121 (!) 119  Resp: (!) 25 (!) 26 (!) 25 19  Temp:  97.7 F (36.5 C)    TempSrc:  Oral    SpO2: 98% 99% 99% 99%  Weight: 66 kg     Height: 5\' 3"  (1.6 m)       Intake/Output Summary (Last 24 hours) at 11/09/2018 1220 Last data filed at 11/09/2018 0900 Gross per 24 hour  Intake 1784.5 ml  Output 0 ml  Net 1784.5 ml   Filed Weights   11/08/18 1456 11/09/18 0532 11/09/18 0848  Weight: 66.3 kg 66 kg 66 kg    Exam: General exam: In no acute distress. Respiratory system: Good air movement and clear to auscultation. Cardiovascular system: S1 & S2 heard, RRR.  Gastrointestinal system: Abdomen is nondistended, soft and nontender.  Central nervous system: Alert and oriented. No focal neurological deficits. Extremities: No pedal edema. Skin: No rashes, lesions or ulcers Psychiatry: Judgement and insight appear normal. Mood & affect appropriate.    Data Reviewed:    Labs: Basic Metabolic Panel: Recent Labs  Lab 11/05/18 0946 11/06/18 1026  NA 137 136  K 4.3 3.6  CL 97 100  CO2 25 26  GLUCOSE 102* 102*  BUN 23 16  CREATININE 0.73 0.75  CALCIUM 10.1 9.6  GFR Estimated Creatinine Clearance: 41.8 mL/min (by C-G formula based on SCr of 0.75 mg/dL). Liver Function Tests: Recent Labs  Lab 11/06/18 1026  AST 28  ALT 19  ALKPHOS 42  BILITOT 0.4  PROT 7.4  ALBUMIN 3.9   Recent Labs  Lab 11/06/18 1026  LIPASE 26   No results for input(s): AMMONIA in the last 168 hours. Coagulation profile No results for input(s): INR, PROTIME in the last 168 hours.  CBC: Recent Labs  Lab 11/05/18 1556 11/06/18 1026  11/07/18 0459 11/07/18 2053 11/08/18 0637 11/08/18 1818 11/09/18 0731  WBC 6.3 8.6  --   --   --   --   --   --   NEUTROABS 4.0 6.6  --   --   --   --   --   --   HGB 11.1* 10.2*   < > 8.7* 9.3* 9.5*  8.5* 8.6*  HCT 34.2* 33.6*  --   --   --   --   --   --   MCV 88.4 93.1  --   --   --   --   --   --   PLT 173.0 178  --   --   --   --   --   --    < > = values in this interval not displayed.   Cardiac Enzymes: No results for input(s): CKTOTAL, CKMB, CKMBINDEX, TROPONINI in the last 168 hours. BNP (last 3 results) Recent Labs    10/21/18 0843  PROBNP 426.0*   CBG: No results for input(s): GLUCAP in the last 168 hours. D-Dimer: No results for input(s): DDIMER in the last 72 hours. Hgb A1c: No results for input(s): HGBA1C in the last 72 hours. Lipid Profile: No results for input(s): CHOL, HDL, LDLCALC, TRIG, CHOLHDL, LDLDIRECT in the last 72 hours. Thyroid function studies: No results for input(s): TSH, T4TOTAL, T3FREE, THYROIDAB in the last 72 hours.  Invalid input(s): FREET3 Anemia work up: No results for input(s): VITAMINB12, FOLATE, FERRITIN, TIBC, IRON, RETICCTPCT in the last 72 hours. Sepsis Labs: Recent Labs  Lab 11/05/18 1556 11/06/18 1026  WBC 6.3 8.6   Microbiology No results found for this or any previous visit (from the past 240 hour(s)).   Medications:   . diltiazem  120 mg Oral BID  . levothyroxine  75 mcg Oral QAC breakfast  . pantoprazole (PROTONIX) IV  40 mg Intravenous Q12H  . potassium chloride  10 mEq Oral Daily  . pravastatin  10 mg Oral Q2000   Continuous Infusions: . sodium chloride 75 mL/hr at 11/09/18 0559  . ferumoxytol 510 mg (11/07/18 1527)      LOS: 3 days   Marinda ElkAbraham Feliz Ortiz  [facility]  11/09/2018, 12:20 PM

## 2018-11-09 NOTE — Anesthesia Preprocedure Evaluation (Addendum)
Anesthesia Evaluation  Patient identified by MRN, date of birth, ID band Patient awake    Reviewed: Allergy & Precautions, NPO status , Patient's Chart, lab work & pertinent test results  History of Anesthesia Complications Negative for: history of anesthetic complications  Airway Mallampati: II  TM Distance: >3 FB Neck ROM: Full    Dental  (+) Dental Advisory Given, Edentulous Upper, Partial Lower   Pulmonary former smoker,    breath sounds clear to auscultation       Cardiovascular hypertension, Pt. on medications + CAD and +CHF  + dysrhythmias Atrial Fibrillation + Valvular Problems/Murmurs MR and MVP  Rhythm:Irregular Rate:Normal   '20 TTE - EF 60-65%. Right ventricular systolic pressure is is moderately elevated. Moderately dilated left atrial size. Moderately dilated right atrial size. Mitral valve regurgitation is moderate to severe by color flow Doppler. Posterior prolapse and moderate to severe MR.    Neuro/Psych negative neurological ROS  negative psych ROS   GI/Hepatic Neg liver ROS, GERD  Controlled,  Endo/Other  Hypothyroidism   Renal/GU negative Renal ROS     Musculoskeletal negative musculoskeletal ROS (+)   Abdominal   Peds  Hematology  (+) anemia ,   Anesthesia Other Findings   Reproductive/Obstetrics                            Anesthesia Physical Anesthesia Plan  ASA: III  Anesthesia Plan: MAC   Post-op Pain Management:    Induction: Intravenous  PONV Risk Score and Plan: 2 and Propofol infusion and Treatment may vary due to age or medical condition  Airway Management Planned: Nasal Cannula and Natural Airway  Additional Equipment: None  Intra-op Plan:   Post-operative Plan:   Informed Consent: I have reviewed the patients History and Physical, chart, labs and discussed the procedure including the risks, benefits and alternatives for the proposed  anesthesia with the patient or authorized representative who has indicated his/her understanding and acceptance.       Plan Discussed with: CRNA and Anesthesiologist  Anesthesia Plan Comments:        Anesthesia Quick Evaluation

## 2018-11-09 NOTE — Op Note (Signed)
[facility] Patient Name: Grace Alvarado Procedure Date : 11/09/2018 MRN: [id number] Attending MD: Starr LakeHenry L. Myrtie Neitheranis , MD Date of Birth: 1925/10/04 CSN: [id number] Age: 85 Admit Type: Inpatient Procedure:                Upper GI endoscopy Indications:              Acute post hemorrhagic anemia, Melena Providers:                Sherilyn CooterHenry L. Myrtie Neitheranis, MD, Dwain SarnaPatricia Ford, RN, Kandice RobinsonsGuillaume                            [place], Technician Referring MD:             [facility] Medicines:                Monitored Anesthesia Care Complications:            No immediate complications. Estimated Blood Loss:     Estimated blood loss: none. Procedure:                Pre-Anesthesia Assessment:                           - Prior to the procedure, a History and Physical                            was performed, and patient medications and                            allergies were reviewed. The patient's tolerance of                            previous anesthesia was also reviewed. The risks                            and benefits of the procedure and the sedation                            options and risks were discussed with the patient.                            All questions were answered, and informed consent                            was obtained. Prior Anticoagulants: The patient has                            taken Xarelto (rivaroxaban), last dose was 3 days                            prior to procedure. ASA Grade Assessment: IV - A                            patient with severe systemic disease that is a  constant threat to life. After reviewing the risks                            and benefits, the patient was deemed in                            satisfactory condition to undergo the procedure.                           - Prior to the procedure, a History and Physical                            was performed, and patient medications and   allergies were reviewed. The patient's tolerance of                            previous anesthesia was also reviewed. The risks                            and benefits of the procedure and the sedation                            options and risks were discussed with the patient.                            All questions were answered, and informed consent                            was obtained. Prior Anticoagulants: The patient has                            taken Xarelto (rivaroxaban), last dose was 3 days                            prior to procedure. ASA Grade Assessment: IV - A                            patient with severe systemic disease that is a                            constant threat to life. After reviewing the risks                            and benefits, the patient was deemed in                            satisfactory condition to undergo the procedure.                           After obtaining informed consent, the endoscope was                            passed under direct vision. Throughout the  procedure, the patient's blood pressure, pulse, and                            oxygen saturations were monitored continuously. The                            GIF-H190 ([id number] Olympus gastroscope was                            introduced through the mouth, and advanced to the                            second part of duodenum. The upper GI endoscopy was                            accomplished without difficulty. The patient                            tolerated the procedure well. Scope In: Scope Out: Findings:      A small hiatal hernia was present.      The stomach was normal.      The cardia and gastric fundus were normal on retroflexion.      The examined duodenum was normal. Impression:               - Small hiatal hernia.                           - Normal stomach.                           - Normal examined duodenum.                            - No specimens collected. Recommendation:           - Patient has a contact number available for                            emergencies. The signs and symptoms of potential                            delayed complications were discussed with the                            patient. Return to normal activities tomorrow.                            Written discharge instructions were provided to the                            patient.                           - Return patient to hospital ward for ongoing care.                           -  Resume regular diet.                           - Continue present medications.                           - See the other procedure note for documentation of                            additional recommendations. Procedure Code(s):        --- Professional ---                           [phone], Esophagogastroduodenoscopy, flexible,                            transoral; diagnostic, including collection of                            specimen(s) by brushing or washing, when performed                            (separate procedure) Diagnosis Code(s):        --- Professional ---                           K44.9, Diaphragmatic hernia without obstruction or                            gangrene                           D62, Acute posthemorrhagic anemia                           K92.1, Melena (includes Hematochezia) CPT copyright 2018 [employer]. All rights reserved. The codes documented in this report are preliminary and upon coder review may  be revised to meet current compliance requirements. Henry L. Myrtie Neither, MD 11/09/2018 11:23:35 AM This report has been signed electronically. Number of Addenda: 0

## 2018-11-09 NOTE — Transfer of Care (Signed)
Immediate Anesthesia Transfer of Care Note  Patient: Grace Alvarado  Procedure(s) Performed: COLONOSCOPY WITH PROPOFOL (N/A ) ESOPHAGOGASTRODUODENOSCOPY (EGD) WITH PROPOFOL (N/A ) HOT HEMOSTASIS (ARGON PLASMA COAGULATION/BICAP) (N/A ) SUBMUCOSAL INJECTION HEMOSTASIS CLIP PLACEMENT  Patient Location: Endoscopy Unit  Anesthesia Type:MAC  Level of Consciousness: awake, alert  and oriented  Airway & Oxygen Therapy: Patient Spontanous Breathing and Patient connected to nasal cannula oxygen  Post-op Assessment: Report given to RN and Post -op Vital signs reviewed and stable  Post vital signs: Reviewed and stable  Last Vitals:  Vitals Value Taken Time  BP 113/79 11/09/2018 11:20 AM  Temp 36.5 C 11/09/2018 11:20 AM  Pulse 112 11/09/2018 11:23 AM  Resp 19 11/09/2018 11:23 AM  SpO2 96 % 11/09/2018 11:23 AM  Vitals shown include unvalidated device data.  Last Pain:  Vitals:   11/09/18 1120  TempSrc: Oral  PainSc: 0-No pain         Complications: No apparent anesthesia complications

## 2018-11-09 NOTE — Telephone Encounter (Signed)
I have canceled the appt.

## 2018-11-09 NOTE — Evaluation (Signed)
Physical Therapy Evaluation Patient Details Name: Grace Alvarado MRN: [id number] DOB: 1925-08-26 Today's Date: 11/09/2018   History of Present Illness  Pt is a 85 y/o female who presents to the hospital with BRBPR. Pt admitted with GI bleed. PMH significant for MVP, hypothyroidism, HTN, a-fib.  Clinical Impression  Pt admitted with above diagnosis. Pt currently with functional limitations due to the deficits listed below (see PT Problem List). At the time of PT evaluation pt required up to min assist for balance support and safety. Pt reports feeling "loopy" due to anesthesia from procedures earlier today. Anticipate pt will progress well and require no further PT follow up, however would like to check in with her once more prior to d/c, which is potentially planned for tomorrow. Pt will benefit from skilled PT to increase their independence and safety with mobility to allow discharge to the venue listed below.       Follow Up Recommendations No PT follow up;Supervision - Intermittent    Equipment Recommendations  None recommended by PT    Recommendations for Other Services       Precautions / Restrictions Precautions Precautions: Fall Restrictions Weight Bearing Restrictions: No      Mobility  Bed Mobility Overal bed mobility: Modified Independent             General bed mobility comments: HOB elevated, however no assist required for pt to transition to EOB.   Transfers Overall transfer level: Modified independent Equipment used: None             General transfer comment: Pt demonstrated proper hand placement on seated surface for safety. No difficulty to power-up to full stand.   Ambulation/Gait Ambulation/Gait assistance: Min assist;Supervision Gait Distance (Feet): 200 Feet Assistive device: None Gait Pattern/deviations: Step-through pattern;Decreased stride length;Staggering right Gait velocity: Decreased Gait velocity interpretation: 1.31 - 2.62 ft/sec,  indicative of limited community ambulator General Gait Details: Overall pt required close supervision for safety, with occasional min assist provided for pt to recover from lateral LOB. x3 LOB in hall and x1 LOB in room.   Stairs            Wheelchair Mobility    Modified Rankin (Stroke Patients Only)       Balance Overall balance assessment: Needs assistance Sitting-balance support: Feet supported;No upper extremity supported Sitting balance-Leahy Scale: Fair Sitting balance - Comments: Posterior lean with LE MMT Postural control: Posterior lean Standing balance support: No upper extremity supported;During functional activity Standing balance-Leahy Scale: Poor Standing balance comment: Occasional assist required during dynamic standing balance activity.                              Pertinent Vitals/Pain Pain Assessment: No/denies pain    Home Living Family/patient expects to be discharged to:: Private residence Living Arrangements: Children Available Help at Discharge: Family;Available 24 hours/day Type of Home: Apartment Home Access: Level entry     Home Layout: One level Home Equipment: Grab bars - tub/shower;Shower seat;Walker - 2 wheels      Prior Function Level of Independence: Independent         Comments: Pt does not drive but can get to the store for groceries. Daughters take her.      Hand Dominance   Dominant Hand: Right    Extremity/Trunk Assessment   Upper Extremity Assessment Upper Extremity Assessment: Overall WFL for tasks assessed    Lower Extremity Assessment Lower Extremity Assessment: Overall WFL for  tasks assessed    Cervical / Trunk Assessment Cervical / Trunk Assessment: Other exceptions Cervical / Trunk Exceptions: Forward head posture with rounded shoulders  Communication   Communication: No difficulties  Cognition Arousal/Alertness: Awake/alert Behavior During Therapy: WFL for tasks  assessed/performed Overall Cognitive Status: Within Functional Limits for tasks assessed                                 General Comments: Pt reports feeling "loopy" from anesthesia earlier.       General Comments      Exercises     Assessment/Plan    PT Assessment Patient needs continued PT services  PT Problem List Decreased activity tolerance;Decreased balance;Decreased mobility;Decreased knowledge of use of DME;Decreased safety awareness;Decreased knowledge of precautions       PT Treatment Interventions DME instruction;Gait training;Functional mobility training;Therapeutic activities;Therapeutic exercise;Neuromuscular re-education;Patient/family education    PT Goals (Current goals can be found in the Care Plan section)  Acute Rehab PT Goals Patient Stated Goal: Home as soon as possible PT Goal Formulation: With patient/family Time For Goal Achievement: 11/16/18 Potential to Achieve Goals: Good    Frequency Min 3X/week   Barriers to discharge        Co-evaluation               AM-PAC PT "6 Clicks" Mobility  Outcome Measure Help needed turning from your back to your side while in a flat bed without using bedrails?: None Help needed moving from lying on your back to sitting on the side of a flat bed without using bedrails?: None Help needed moving to and from a bed to a chair (including a wheelchair)?: None Help needed standing up from a chair using your arms (e.g., wheelchair or bedside chair)?: None Help needed to walk in hospital room?: A Little Help needed climbing 3-5 steps with a railing? : A Little 6 Click Score: 22    End of Session Equipment Utilized During Treatment: Gait belt Activity Tolerance: Patient tolerated treatment well Patient left: in chair;with call bell/phone within reach;with family/visitor present Nurse Communication: Mobility status PT Visit Diagnosis: Unsteadiness on feet (R26.81)    Time: 0981-19141414-1435 PT Time  Calculation (min) (ACUTE ONLY): 21 min   Charges:   PT Evaluation $PT Eval Moderate Complexity: 1 Mod          Conni SlipperLaura Akyah Lagrange, PT, DPT Acute Rehabilitation Services Pager: [phone] Office: [phone]   Marylynn PearsonLaura D Catalaya Garr 11/09/2018, 2:48 PM

## 2018-11-10 ENCOUNTER — Other Ambulatory Visit: Payer: Self-pay | Admitting: Family Medicine

## 2018-11-10 DIAGNOSIS — D500 Iron deficiency anemia secondary to blood loss (chronic): Secondary | ICD-10-CM

## 2018-11-10 DIAGNOSIS — K5520 Angiodysplasia of colon without hemorrhage: Secondary | ICD-10-CM

## 2018-11-10 DIAGNOSIS — K922 Gastrointestinal hemorrhage, unspecified: Secondary | ICD-10-CM

## 2018-11-10 DIAGNOSIS — I340 Nonrheumatic mitral (valve) insufficiency: Secondary | ICD-10-CM

## 2018-11-10 DIAGNOSIS — D508 Other iron deficiency anemias: Secondary | ICD-10-CM

## 2018-11-10 DIAGNOSIS — D62 Acute posthemorrhagic anemia: Secondary | ICD-10-CM

## 2018-11-10 DIAGNOSIS — E876 Hypokalemia: Secondary | ICD-10-CM

## 2018-11-10 DIAGNOSIS — K5521 Angiodysplasia of colon with hemorrhage: Principal | ICD-10-CM

## 2018-11-10 DIAGNOSIS — I482 Chronic atrial fibrillation, unspecified: Secondary | ICD-10-CM

## 2018-11-10 LAB — CBC
HCT: 27.4 % — ABNORMAL LOW (ref 36.0–46.0)
Hemoglobin: 8.3 g/dL — ABNORMAL LOW (ref 12.0–15.0)
MCH: 28.2 pg (ref 26.0–34.0)
MCHC: 30.3 g/dL (ref 30.0–36.0)
MCV: 93.2 fL (ref 80.0–100.0)
Platelets: 126 10*3/uL — ABNORMAL LOW (ref 150–400)
RBC: 2.94 MIL/uL — AB (ref 3.87–5.11)
RDW: 19.6 % — ABNORMAL HIGH (ref 11.5–15.5)
WBC: 4.4 10*3/uL (ref 4.0–10.5)
nRBC: 0 % (ref 0.0–0.2)

## 2018-11-10 MED ORDER — FOLIC ACID 1 MG PO TABS
1.0000 mg | ORAL_TABLET | Freq: Every day | ORAL | Status: DC
  Administered 2018-11-10: 1 mg via ORAL
  Filled 2018-11-10: qty 1

## 2018-11-10 MED ORDER — FERROUS GLUCONATE 324 (38 FE) MG PO TABS
324.0000 mg | ORAL_TABLET | Freq: Two times a day (BID) | ORAL | Status: DC
  Administered 2018-11-10: 324 mg via ORAL
  Filled 2018-11-10: qty 1

## 2018-11-10 MED ORDER — RIVAROXABAN 20 MG PO TABS: 20.0000 mg | ORAL_TABLET | Freq: Every day | ORAL | 0 refills | Status: DC

## 2018-11-10 MED ORDER — ACETAMINOPHEN 325 MG PO TABS
650.0000 mg | ORAL_TABLET | Freq: Four times a day (QID) | ORAL | Status: DC | PRN
  Administered 2018-11-10: 650 mg via ORAL
  Filled 2018-11-10: qty 2

## 2018-11-10 MED ORDER — FERROUS GLUCONATE 324 (38 FE) MG PO TABS: 648.0000 mg | ORAL_TABLET | ORAL | 0 refills | Status: DC

## 2018-11-10 MED ORDER — FOLIC ACID 1 MG PO TABS: 1.0000 mg | ORAL_TABLET | Freq: Every day | ORAL | 0 refills | Status: DC

## 2018-11-10 NOTE — Care Management Note (Addendum)
Case Management Note Hortencia Conradi, RN MSN CCM Transitions of Care [address] [place] [phone]  Patient Details  Name: Grace Alvarado MRN: [id number] Date of Birth: 1925/05/08  Subjective/Objective:    Lower GI Bleed                Action/Plan: PTA home with daughter. Independent. Manages own medications. Daughters assist with transportation to medical appointments. Noted no needs identified by PT. Pt reports no needs, and expresses appreciation for care received. Pt to transition home today. No transition of care needs identified.   Expected Discharge Date:  11/10/18               Expected Discharge Plan:  Home/Self Care  In-House Referral:  NA  Discharge planning Services  CM Consult  Post Acute Care Choice:  NA Choice offered to:  NA  DME Arranged:  N/A DME Agency:  NA  HH Arranged:  NA HH Agency:  NA  Status of Service:  Completed, signed off  If discussed at Long Length of Stay Meetings, dates discussed:    Additional Comments:  Bess Kinds, RN 11/10/2018, 12:45 PM

## 2018-11-10 NOTE — Progress Notes (Signed)
Physical Therapy Treatment Patient Details Name: Grace Alvarado MRN: [id number] DOB: [birth year] Today's Date: 11/10/2018    History of Present Illness Pt is a 85 y/o female who presents to the hospital with BRBPR. Pt admitted with GI bleed. PMH significant for MVP, hypothyroidism, HTN, a-fib.    PT Comments    Pt progressing towards physical therapy goals. Was able to perform transfers with modified independence, and progressed to modified independent by end of gait training. Pt reports feeling better today however also states her back and hips are painful today. Pt anticipates d/c home today. Will continue to follow and progress as able per POC.   Follow Up Recommendations  No PT follow up;Supervision - Intermittent     Equipment Recommendations  None recommended by PT    Recommendations for Other Services       Precautions / Restrictions Precautions Precautions: Fall Restrictions Weight Bearing Restrictions: No    Mobility  Bed Mobility Overal bed mobility: Modified Independent             General bed mobility comments: HOB elevated, however no assist required for pt to transition to/from EOB.   Transfers Overall transfer level: Modified independent Equipment used: None             General transfer comment: Pt demonstrated proper hand placement on seated surface for safety. No difficulty to power-up to full stand.   Ambulation/Gait Ambulation/Gait assistance: Supervision;Modified independent (Device/Increase time) Gait Distance (Feet): 300 Feet Assistive device: None Gait Pattern/deviations: Step-through pattern;Decreased stride length;Staggering right Gait velocity: Decreased Gait velocity interpretation: 1.31 - 2.62 ft/sec, indicative of limited community ambulator General Gait Details: Light supervision initially progressing to modified independence. Pt was able to ambulate fairly well in hall without assistance. Noted mild lateral deviation however no  overt LOB noted.    Stairs             Wheelchair Mobility    Modified Rankin (Stroke Patients Only)       Balance Overall balance assessment: Needs assistance Sitting-balance support: Feet supported;No upper extremity supported Sitting balance-Leahy Scale: Fair     Standing balance support: No upper extremity supported;During functional activity Standing balance-Leahy Scale: Poor Standing balance comment: Occasional assist required during dynamic standing balance activity.                             Cognition Arousal/Alertness: Awake/alert Behavior During Therapy: WFL for tasks assessed/performed Overall Cognitive Status: Within Functional Limits for tasks assessed                                        Exercises      General Comments        Pertinent Vitals/Pain Pain Assessment: No/denies pain    Home Living                      Prior Function            PT Goals (current goals can now be found in the care plan section) Acute Rehab PT Goals Patient Stated Goal: Home as soon as possible PT Goal Formulation: With patient/family Time For Goal Achievement: 11/16/18 Potential to Achieve Goals: Good Progress towards PT goals: Progressing toward goals    Frequency    Min 3X/week      PT Plan Current plan remains appropriate  Co-evaluation              AM-PAC PT "6 Clicks" Mobility   Outcome Measure  Help needed turning from your back to your side while in a flat bed without using bedrails?: None Help needed moving from lying on your back to sitting on the side of a flat bed without using bedrails?: None Help needed moving to and from a bed to a chair (including a wheelchair)?: None Help needed standing up from a chair using your arms (e.g., wheelchair or bedside chair)?: None Help needed to walk in hospital room?: None Help needed climbing 3-5 steps with a railing? : A Little 6 Click Score: 23     End of Session Equipment Utilized During Treatment: Gait belt Activity Tolerance: Patient tolerated treatment well Patient left: in bed;with call bell/phone within reach;with family/visitor present Nurse Communication: Mobility status PT Visit Diagnosis: Unsteadiness on feet (R26.81)     Time: [id number] PT Time Calculation (min) (ACUTE ONLY): 10 min  Charges:  $Gait Training: 8-22 mins                     Conni SlipperLaura Chassidy Layson, PT, DPT Acute Rehabilitation Services Pager: [phone] Office: [phone]    Marylynn PearsonLaura D Deyna Carbon 11/10/2018, 10:02 AM

## 2018-11-10 NOTE — Care Management Important Message (Signed)
Important Message  Patient Details  Name: Grace Alvarado MRN: [id number] Date of Birth: 1925-09-23   Medicare Important Message Given:  Yes    Dorena Bodo 11/10/2018, 2:51 PM

## 2018-11-10 NOTE — Discharge Summary (Signed)
Discharge Summary  Grace Alvarado ZOX:[id number] DOB: 1925/09/25  PCP: Pincus SanesBurns, Stacy J, MD  Admit date: 11/06/2018 Discharge date: 11/10/2018  Time spent: 30mins.  Recommendations for Outpatient Follow-up:  1. F/u with PCP within a week  for hospital discharge follow up, repeat cbc/bmp at follow up. pcp to arrange second dose of iv iron infusion in two weeks. Hold xarelto, resume on 2/11 if no more gi bleed. 2. F/u with GI as needed  Discharge Diagnoses:  Active Hospital Problems   Diagnosis Date Noted  . Lower GI bleed 11/06/2018  . Mitral regurgitation 05/17/2018  . Hypothyroid 05/17/2018  . Essential hypertension 05/17/2018  . Chronic a-fib 05/17/2018    Resolved Hospital Problems  No resolved problems to display.    Discharge Condition: stable  Diet recommendation: heart healthy  Filed Weights   11/09/18 0532 11/09/18 0848 11/09/18 2112  Weight: 66 kg 66 kg 67.7 kg    History of present illness: (per admitting MD Dr Sharyon MedicusHijazi) Grace Alvarado  is a 85 y.o. female, with past medical history significant for A. fib on Xarelto presenting with 2 days history of frank blood per rectum.  In the emergency room she was guaiac positive but no fresh blood was noted.  Her hemoglobin is 10.2 down from 11.1 on 1/31.  Patient denies abdominal pain, nausea or vomiting.  Denies any diarrhea.  Denies any fever or chills.  No chest pains or shortness of breath.  Mild generalized weakness. Gastroenterology was contacted and they will follow up for endoscopy.  CT of abdomen showed sigmoid diverticulosis without inflammation with no acute abnormality seen in the abdomen and pelvis.  Her heart rate in the emergency room was 102-110 .  We decided to place her on telemetry   Hospital Course:  Active Problems:   Mitral regurgitation   Chronic a-fib   Essential hypertension   Hypothyroid   Lower GI bleed  Lower GI bleed: -Xarelto held on admission -s/p EGD and colonoscopy on 11/08/2018 which showed  telangiectasia in her colon which was cauterized, no other sites of possible bleeding. S/p epi, APCd, clipped x 2 by GI Dr Caren Macadamannis, detail please refer to procedure note. -no bm for 48hrs -gi oked to resume xarelto on 2/11   Acute blood loss anemia:  -she did not require blood transfusion, she did receive iv iron infusion, she is to continue oral iron supplement,  -pcp to arrange second dose of iv iron supplement in two weeks -she is continue to get b12 injection q monthly from pcp -she is started on oral folic acid supplement  Mild thrombocytopenia: -plt 126 -likely from gi bleed, expect improvement, repeat cbc at hospital follow up. -f/u with pcp  . Chronic a-fib Rate controlled, hold Xarelto for 7 days starting 11/09/2018, resume on 2/11 if no more bleed Continue home meds cardizem,   Hypokalemia: continue potassium supplement  Chronic lower extremity edema: Has much improved in the hospital, patient attributed this to bed rest, she is encouraged to elevate legs when sitting, wearing compression stocking when standing up or walking around -continue lasix as ordered by cardiology  MVP: conservative management , she is followed by cardiology  Essential hypertension: bp stable on cardizem  HLD; continue statin (pravachol)  Hypothyroid Continue Synthroid.  Procedures:  EGD/colonoscopy on 2/4  Consultations:  LBGI  Discharge Exam: BP 120/68 (BP Location: Left Arm)   Pulse (!) 117   Temp 98.3 F (36.8 C) (Oral)   Resp 18   Ht 5\' 3"  (1.6 m)  Wt 67.7 kg   LMP  (LMP Unknown)   SpO2 99%   BMI 26.45 kg/m   General: NAD, appear younger than stated age Cardiovascular: IRRR Respiratory: CTABL Ab: soft, nontender, +bs Extremity: soft, no significant pitting edema  Discharge Instructions You were cared for by a hospitalist during your hospital stay. If you have any questions about your discharge medications or the care you received while you were in the hospital  after you are discharged, you can call the unit and asked to speak with the hospitalist on call if the hospitalist that took care of you is not available. Once you are discharged, your primary care physician will handle any further medical issues. Please note that NO REFILLS for any discharge medications will be authorized once you are discharged, as it is imperative that you return to your primary care physician (or establish a relationship with a primary care physician if you do not have one) for your aftercare needs so that they can reassess your need for medications and monitor your lab values.  Discharge Instructions    Diet - low sodium heart healthy   Complete by:  As directed    Increase activity slowly   Complete by:  As directed      Allergies as of 11/10/2018      Reactions   Codeine Other (See Comments)   unknown   Penicillins Other (See Comments)   unknown   Vancomycin Other (See Comments)   unknown   Zithromax [azithromycin] Other (See Comments)   unknown   Latex Rash      Medication List    TAKE these medications   cholecalciferol 25 MCG (1000 UT) tablet Commonly known as:  VITAMIN D3 Take 1,000 Units by mouth daily.   diltiazem 120 MG 12 hr capsule Commonly known as:  CARDIZEM SR Take 1 capsule (120 mg total) by mouth 2 (two) times daily.   ferrous gluconate 324 MG tablet Commonly known as:  FERGON Take 2 tablets (648 mg total) by mouth every Monday, Wednesday, and Friday. What changed:    how much to take  when to take this   furosemide 20 MG tablet Commonly known as:  LASIX Take 2 tablets (40 mg total) by mouth daily.   levothyroxine 75 MCG tablet Commonly known as:  SYNTHROID, LEVOTHROID Take 1 tablet (75 mcg total) by mouth daily before breakfast.   potassium chloride 10 MEQ tablet Commonly known as:  K-DUR Take 1 tablet (10 mEq total) by mouth daily.   PRAVACHOL 10 MG tablet Generic drug:  pravastatin Take 10 mg by mouth daily.   PRO-BIOTIC  BLEND PO Take 1 capsule by mouth daily.   rivaroxaban 20 MG Tabs tablet Commonly known as:  XARELTO Take 1 tablet (20 mg total) by mouth daily with supper. Hold xarelto until 2/11 Start taking on:  November 16, 2018 What changed:    additional instructions  These instructions start on November 16, 2018. If you are unsure what to do until then, ask your doctor or other care provider.   vitamin C 500 MG tablet Commonly known as:  ASCORBIC ACID Take 500 mg by mouth daily.   VITAMIN E/FOLIC ACID/B-6/B-12 PO Take 1 tablet by mouth daily.      Allergies  Allergen Reactions  . Codeine Other (See Comments)    unknown  . Penicillins Other (See Comments)    unknown  . Vancomycin Other (See Comments)    unknown  . Zithromax [Azithromycin] Other (See Comments)  unknown  . Latex Rash   Follow-up Information    Pincus Sanes, MD Follow up in 1 week(s).   Specialty:  Internal Medicine Why:  hospital discharge follow up, repeat cbc/bmp at follow up. pcp to arrange iv iron infusion x1 in two weeks. please call your doctor immediately if you see blood in the stool.  Contact information: [address] [place] [address] [phone]        Sherrilyn Rist, MD Follow up.   Specialty:  Gastroenterology Why:  as needed for gi bleed Contact information: [address] [place] [address] [phone]            The results of significant diagnostics from this hospitalization (including imaging, microbiology, ancillary and laboratory) are listed below for reference.    Significant Diagnostic Studies: Ct Abdomen Pelvis W Contrast  Result Date: 11/06/2018 CLINICAL DATA:  Rectal bleeding, abdominal pain. EXAM: CT ABDOMEN AND PELVIS WITH CONTRAST TECHNIQUE: Multidetector CT imaging of the abdomen and pelvis was performed using the standard protocol following bolus administration of intravenous contrast. CONTRAST:  OMNIPAQUE IOHEXOL 300 MG/ML  SOLN  COMPARISON:  None. FINDINGS: Lower chest: No acute abnormality. Hepatobiliary: No focal liver abnormality is seen. Status post cholecystectomy. No biliary dilatation. Pancreas: Unremarkable. No pancreatic ductal dilatation or surrounding inflammatory changes. Spleen: Normal in size without focal abnormality. Adrenals/Urinary Tract: Adrenal glands are unremarkable. Kidneys are normal, without renal calculi, focal lesion, or hydronephrosis. Bladder is unremarkable. Stomach/Bowel: The stomach appears normal. There is no evidence of bowel obstruction or inflammation. Sigmoid diverticulosis is noted. Status post appendectomy. Vascular/Lymphatic: Aortic atherosclerosis. No enlarged abdominal or pelvic lymph nodes. Reproductive: Uterus and bilateral adnexa are unremarkable. Other: No abdominal wall hernia or abnormality. No abdominopelvic ascites. Musculoskeletal: Severe multilevel degenerative disc disease is noted in lower lumbar spine. No acute abnormality is noted. IMPRESSION: Sigmoid diverticulosis without inflammation. Severe multilevel degenerative disc disease in lower lumbar spine. No acute abnormality seen in the abdomen or pelvis. Aortic Atherosclerosis (ICD10-I70.0). Electronically Signed   By: Lupita Raider, M.D.   On: 11/06/2018 11:51   Vas [place] Lower Extremity Venous (dvt)  Result Date: 10/24/2018  Lower Venous Study Indications: Edema.  Risk Factors: Lymphedema. Comparison Study: No prior study Performing Technologist: Sherren Kerns RVS  Examination Guidelines: A complete evaluation includes B-mode imaging, spectral Doppler, color Doppler, and power Doppler as needed of all accessible portions of each vessel. Bilateral testing is considered an integral part of a complete examination. Limited examinations for reoccurring indications may be performed as noted.  Right Venous Findings: +---+---------------+---------+-----------+----------+-------+     CompressibilityPhasicitySpontaneityPropertiesSummary +---+---------------+---------+-----------+----------+-------+ CFVFull           Yes      Yes                          +---+---------------+---------+-----------+----------+-------+ SFJFull                                                 +---+---------------+---------+-----------+----------+-------+  Left Venous Findings: +---------+---------------+---------+-----------+----------+-------+          CompressibilityPhasicitySpontaneityPropertiesSummary +---------+---------------+---------+-----------+----------+-------+ CFV      Full           Yes      Yes                          +---------+---------------+---------+-----------+----------+-------+  SFJ      Full                                                 +---------+---------------+---------+-----------+----------+-------+ FV Prox  Full                                                 +---------+---------------+---------+-----------+----------+-------+ FV Mid   Full                                                 +---------+---------------+---------+-----------+----------+-------+ FV DistalFull                                                 +---------+---------------+---------+-----------+----------+-------+ PFV      Full                                                 +---------+---------------+---------+-----------+----------+-------+ POP      Full           Yes      Yes                          +---------+---------------+---------+-----------+----------+-------+ PTV      Full                                                 +---------+---------------+---------+-----------+----------+-------+ PERO     Full                                                 +---------+---------------+---------+-----------+----------+-------+ GSV      Full                                                  +---------+---------------+---------+-----------+----------+-------+    Summary: Right: No evidence of common femoral vein obstruction. Left: There is no evidence of deep vein thrombosis in the lower extremity.There is no evidence of superficial venous thrombosis.  *See table(s) above for measurements and observations. Electronically signed by Coral Else MD on 10/24/2018 at 5:01:29 PM.    Final     Microbiology: No results found for this or any previous visit (from the past 240 hour(s)).   Labs: Basic Metabolic Panel: Recent Labs  Lab 11/05/18 0946 11/06/18 1026  NA 137 136  K 4.3 3.6  CL 97 100  CO2 25 26  GLUCOSE 102* 102*  BUN 23 16  CREATININE 0.73 0.75  CALCIUM 10.1 9.6   Liver Function Tests: Recent Labs  Lab 11/06/18 1026  AST 28  ALT 19  ALKPHOS 42  BILITOT 0.4  PROT 7.4  ALBUMIN 3.9   Recent Labs  Lab 11/06/18 1026  LIPASE 26   No results for input(s): AMMONIA in the last 168 hours. CBC: Recent Labs  Lab 11/05/18 1556 11/06/18 1026  11/07/18 2053 11/08/18 0637 11/08/18 1818 11/09/18 0731 11/10/18 0550  WBC 6.3 8.6  --   --   --   --   --  4.4  NEUTROABS 4.0 6.6  --   --   --   --   --   --   HGB 11.1* 10.2*   < > 9.3* 9.5* 8.5* 8.6* 8.3*  HCT 34.2* 33.6*  --   --   --   --   --  27.4*  MCV 88.4 93.1  --   --   --   --   --  93.2  PLT 173.0 178  --   --   --   --   --  126*   < > = values in this interval not displayed.   Cardiac Enzymes: No results for input(s): CKTOTAL, CKMB, CKMBINDEX, TROPONINI in the last 168 hours. BNP: BNP (last 3 results) No results for input(s): BNP in the last 8760 hours.  ProBNP (last 3 results) Recent Labs    10/21/18 0843  PROBNP 426.0*    CBG: No results for input(s): GLUCAP in the last 168 hours.     Signed:  Albertine Grates MD, PhD  [facility] 11/10/2018, 12:43 PM

## 2018-11-10 NOTE — Progress Notes (Addendum)
Daily Rounding Note  11/10/2018, 9:25 AM  LOS: 4 days   SUBJECTIVE:   Chief complaint: anemia, hematochezia Pt has not had BM since bowel prep.  She feels well.   Tolerating solid food  OBJECTIVE:         Vital signs in last 24 hours:    Temp:  [97.7 F (36.5 C)-98.3 F (36.8 C)] 98.3 F (36.8 C) (02/05 0855) Pulse Rate:  [79-121] 117 (02/05 0855) Resp:  [16-26] 18 (02/05 0855) BP: (105-127)/(56-79) 120/68 (02/05 0855) SpO2:  [95 %-99 %] 99 % (02/05 0855) Weight:  [67.7 kg] 67.7 kg (02/04 2112) Last BM Date: 11/09/18 Filed Weights   11/09/18 0532 11/09/18 0848 11/09/18 2112  Weight: 66 kg 66 kg 67.7 kg   General: looks somewhat frail but considering age 85, looks well.  Alert, appropriate   Heart: irreg Chest: clear bil.   Abdomen: soft, NT, ND.  Active BS  Extremities: no CCE Neuro/Psych:  Oriented x 3.  Speech fluid, moving all limbs without tremors or deficits.    Intake/Output from previous day: 02/04 0701 - 02/05 0700 In: 553.1 [P.O.:360; I.V.:193.1] Out: 0   Intake/Output this shift: No intake/output data recorded.  Lab Results: Recent Labs    11/08/18 1818 11/09/18 0731 11/10/18 0550  WBC  --   --  4.4  HGB 8.5* 8.6* 8.3*  HCT  --   --  27.4*  PLT  --   --  126*   Scheduled Meds: . diltiazem  120 mg Oral BID  . levothyroxine  75 mcg Oral QAC breakfast  . pantoprazole (PROTONIX) IV  40 mg Intravenous Q12H  . potassium chloride  10 mEq Oral Daily  . pravastatin  10 mg Oral Q2000   Continuous Infusions: . sodium chloride 75 mL/hr at 11/09/18 0559  . ferumoxytol 510 mg (11/07/18 1527)   PRN Meds:.acetaminophen, ondansetron **OR** ondansetron (ZOFRAN) IV, zolpidem    ASSESMENT:   *   Hematochezia.   EGD 2/4: small HH.  O/W normal.    Colonoscopy 2/4: diverticulosis, ascending telangectasia injected with epi, APCd, clipped x 2. Decreased rectal sphincter tone.    *   IDA.  Required  PRBCs in 10/2018.  Oral iron at home.  Hgb low but stable. S/p feraheme. First of 2 doses was 2/1.    *    Chronic Xarelto, last dose 1/31.      PLAN   *   Resume Xarelto on 2/11 per Dr Myrtie Neither' rec.   Restarting Ferrous Gluconate bid.    *   Stop IV Protonix.  Given benign EGD findings does not need PPI, but given imminent restart of Xarelto should she have preventative acid suppression on board?   *   Restart Iron po.  BID.   Will need second dose of Feraheme within next 2 weeks.  Will need to have hgb assay within 2 weeks and monitored regularly until sufficiently improved and stable.    Jennye Moccasin  11/10/2018, 9:25 AM Phone [phone]  GI ATTENDING  Interval history and data reviewed.  Endoscopic procedures personally reviewed.  Patient personally seen and examined.  Agree with interval progress note.  Patient is doing well without complaints.  Wants to go home.  Eating well.  Daughter Angelique Blonder in the room.  No evidence for GI bleeding.  Hemoglobin stable at 8.3.  Previous recommendation to hold Xarelto for 1 week reviewed with the patient and her daughter.  Okay for discharge from GI perspective.  No specific outpatient GI follow-up required.  Will sign off.  Thank you  Wilhemina Bonito. Eda Keys., M.D. [facility] Division of Gastroenterology

## 2018-11-11 ENCOUNTER — Encounter: Payer: Self-pay | Admitting: Internal Medicine

## 2018-11-11 ENCOUNTER — Telehealth: Payer: Self-pay | Admitting: *Deleted

## 2018-11-11 NOTE — Telephone Encounter (Signed)
Called pt to verify appt that has been made for 11/17/18 for hosp F/U. Called pt she states yes her daughter made the appt for her, and she's aware. Inform pt had some additional question concerning discharge, completed TCM call below.Raechel Chute  Transition Care Management Follow-up Telephone Call   Date discharged? 11/10/18   How have you been since you were released from the hospital? Pt states she is doing ok   Do you understand why you were in the hospital? YES   Do you understand the discharge instructions? YES   Where were you discharged to? Home   Items Reviewed:  Medications reviewed: YES  Allergies reviewed: YES  Dietary changes reviewed: YES  Referrals reviewed: YES, have appt already set-up to see GI    Functional Questionnaire:   Activities of Daily Living (ADLs):   She states she are independent in the following: ambulation, bathing and hygiene, feeding, continence, grooming, toileting and dressing States she doesn't require assistance    Any transportation issues/concerns?: NO   Any patient concerns? NO   Confirmed importance and date/time of follow-up visits scheduled YES, 11/17/18  Provider Appointment booked with Dr. Lawerance Bach  Confirmed with patient if condition begins to worsen call PCP or go to the ER.  Patient was given the office number and encouraged to call back with question or concerns.  : YES

## 2018-11-15 NOTE — Patient Instructions (Addendum)
  Tests ordered today. Your results will be released to MyChart (or called to you) after review, usually within 72hours after test completion. If any changes need to be made, you will be notified at that same time.   Medications reviewed and updated.  Changes include :   none     Please followup in 4 weeks

## 2018-11-15 NOTE — Progress Notes (Signed)
Subjective:    Patient ID: Grace Alvarado, female    DOB: Mar 10, 1925, 85 y.o.   MRN: [id number]  HPI The patient is here for follow up from the hospital.   Admitted 2/1 - 2/5 for GI bleed.    Recommendations for Outpatient Follow-up:  1. F/u with PCP within a week  for hospital discharge follow up, repeat cbc/bmp at follow up. pcp to arrange second dose of iv iron infusion in two weeks. Hold xarelto, resume on 2/11 if no more gi bleed. 2. F/u with GI as needed   She went to the ED with rectal bleeding.  She is on xarelto for Afib.  She was guaiac positive in the ED w/o fresh blood.  Her hemoglobin was 10.2, down from 11/1 on 1/31.  She denied abdominal pain, diarrhea, nausea, fever and chills, chest pain, SOB and weakness.  GI was consulted.  Ct of Abdomen showed sigmoid diverticulosis w/o inflammation w/o acute abnormality.  Her HR was 102-110.      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  No other sites of possible bleeding. S/p Epi, APCd, clipped x 2 by Dr Myrtie Neither  Acute blood loss anemia: She did not require a transfusion  Received IV iron infusion Will continue oral iron supplementation Needs second iron infusion after two weeks (next week) B12 injections monthly Started on oral folic acid   Mild thrombocytopenia: Plt 126 Likely related to GI bleed Repeat cbc today  Chronic Afib: Rate controlled xarelto held for 7 days 11/09/18 - 2/11 - restart today if no bleeding Continued on cardizem  Hypokalemia: Continued potassium supplement  Chronic lower extremity edema: Much improved in the hospital - likely due to bed rest Advised to elevate legs when sitting Wear compression socks - she is not using them Continue lasix  Hypertension: Stable on cardizem  Hyperlipidemia: Continued on  pravastatin  Hypothyroidism: Continued on synthroid    Iron deficiency anemia, lower GI bleed: She is taking 2 iron pills 3 times a week.  She is having some darker stool, but that just started today.  She denies any bright red blood in the stool.  He is having some constipation and lower abdominal cramping.  She denies any nausea.  Her appetite is decreased, but slowly improving.  Chronic atrial fibrillation, hypertension: She is taking her medication as prescribed.  Xarelto has been on hold.  She denies any palpitations, chest pain, lightheadedness.  Leg edema: She continues to have left lower extremity edema.  She is taking Lasix and potassium daily.  She has been elevating her legs.  She was not able to wear the compression socks-it is difficult to get on and the ones that she has do not fit properly.  Her leg swelling was well controlled in the hospital, but she was on bedrest.  The swelling has gotten slightly worse since being home.  She is not experiencing any shortness of breath.  She is compliant with a low-sodium diet.  Neck pain, left side:  This started two days ago. The pain is worse with movement.  She has taken tylenol, which has helped.  They applied ice and tried heat.  She tried icy hot.  She denies radiation in the arm.  She denies N/T.  She is not having headaches.  Her pain has gotten better over the past couple of  days.  Constipation, fecal incontinence:  She is taking iron daily and received an iron infusion in the hospital.  She is having lack of control and going small amounts.  She had soft stool today.  The stool is dark.  There is no bright red blood.  She has been eating a low fiber diet wonders if she can resume her normal diet.  She is having abdominal cramping and discomfort across her lower abdomen.  She does not think she has been having enough stool compared to what she is eating.  B12 def:  She is due for a b12 injection - given today.      Medications and  allergies reviewed with patient and updated if appropriate.  Patient Active Problem List   Diagnosis Date Noted  . Hypokalemia   . AVM (arteriovenous malformation) of colon   . Lower GI bleed 11/06/2018  . Rectal bleeding 11/05/2018  . Iron deficiency anemia 11/02/2018  . Lymphedema of left lower extremity 10/22/2018  . B12 deficiency 05/27/2018  . Traumatic hematoma of right knee 05/25/2018  . Dependent edema 05/25/2018  . Cellulitis 05/22/2018  . GERD (gastroesophageal reflux disease) 05/20/2018  . Pulmonary hypertension, primary (HCC) 05/20/2018  . Hyperlipidemia 05/20/2018  . CHF (congestive heart failure) (HCC) 05/20/2018  . Tricuspid regurgitation 05/17/2018  . Mitral regurgitation 05/17/2018  . Chronic a-fib 05/17/2018  . Essential hypertension 05/17/2018  . Hypothyroid 05/17/2018  . CAD (coronary artery disease) 05/17/2018  . Carotid atherosclerosis, bilateral 05/17/2018    Current Outpatient Medications on File Prior to Visit  Medication Sig Dispense Refill  . cholecalciferol (VITAMIN D3) 25 MCG (1000 UT) tablet Take 1,000 Units by mouth daily.    Marland Kitchen diltiazem (CARDIZEM SR) 120 MG 12 hr capsule Take 1 capsule (120 mg total) by mouth 2 (two) times daily. 180 capsule 1  . ferrous gluconate (FERGON) 324 MG tablet Take 2 tablets (648 mg total) by mouth every Monday, Wednesday, and Friday. 90 tablet 0  . furosemide (LASIX) 20 MG tablet Take 2 tablets (40 mg total) by mouth daily. 180 tablet 3  . levothyroxine (SYNTHROID, LEVOTHROID) 75 MCG tablet Take 1 tablet (75 mcg total) by mouth daily before breakfast. 90 tablet 3  . Multiple Vitamin (VITAMIN E/FOLIC ACID/B-6/B-12 PO) Take 1 tablet by mouth daily.    . potassium chloride (K-DUR) 10 MEQ tablet Take 1 tablet (10 mEq total) by mouth daily. 30 tablet 0  . pravastatin (PRAVACHOL) 10 MG tablet Take 10 mg by mouth daily.  90 tablet 3  . Probiotic Product (PRO-BIOTIC BLEND PO) Take 1 capsule by mouth daily.    . rivaroxaban  (XARELTO) 20 MG TABS tablet Take 1 tablet (20 mg total) by mouth daily with supper. Hold xarelto until 2/11 30 tablet 0  . vitamin C (ASCORBIC ACID) 500 MG tablet Take 500 mg by mouth daily.     No current facility-administered medications on file prior to visit.     Past Medical History:  Diagnosis Date  . Atrial fibrillation (HCC)   . Hyperlipidemia   . Hypertension   . Hypothyroidism   . Mitral regurgitation   . MVP (mitral valve prolapse)     Past Surgical History:  Procedure Laterality Date  . APPENDECTOMY    . c section    . CESAREAN SECTION    . CHOLECYSTECTOMY    . COLONOSCOPY WITH PROPOFOL N/A 11/09/2018   Procedure: COLONOSCOPY WITH PROPOFOL;  Surgeon: Sherrilyn Rist, MD;  Location: [facility] ENDOSCOPY;  Service: Gastroenterology;  Laterality: N/A;  . ESOPHAGOGASTRODUODENOSCOPY (EGD) WITH PROPOFOL N/A 11/09/2018   Procedure: ESOPHAGOGASTRODUODENOSCOPY (EGD) WITH PROPOFOL;  Surgeon: Sherrilyn Rist, MD;  Location: [facility] ENDOSCOPY;  Service: Gastroenterology;  Laterality: N/A;  . HOT HEMOSTASIS N/A 11/09/2018   Procedure: HOT HEMOSTASIS (ARGON PLASMA COAGULATION/BICAP);  Surgeon: Sherrilyn Rist, MD;  Location: [facility] ENDOSCOPY;  Service: Gastroenterology;  Laterality: N/A;  . SUBMUCOSAL INJECTION  11/09/2018   Procedure: SUBMUCOSAL INJECTION;  Surgeon: Sherrilyn Rist, MD;  Location: [facility] ENDOSCOPY;  Service: Gastroenterology;;    Social History   Socioeconomic History  . Marital status: Widowed    Spouse name: Not on file  . Number of children: 3  . Years of education: Not on file  . Highest education level: Not on file  Occupational History  . Occupation: retired  Engineer, production  . Financial resource strain: Not on file  . Food insecurity:    Worry: Not on file    Inability: Not on file  . Transportation needs:    Medical: Not on file    Non-medical: Not on file  Tobacco Use  . Smoking status: Former Games developer  . Smokeless tobacco: Never Used  Substance and Sexual  Activity  . Alcohol use: Not Currently  . Drug use: Never  . Sexual activity: Not Currently  Lifestyle  . Physical activity:    Days per week: Not on file    Minutes per session: Not on file  . Stress: Not on file  Relationships  . Social connections:    Talks on phone: Not on file    Gets together: Not on file    Attends religious service: Not on file    Active member of club or organization: Not on file    Attends meetings of clubs or organizations: Not on file    Relationship status: Not on file  Other Topics Concern  . Not on file  Social History Narrative  . Not on file    Family History  Problem Relation Age of Onset  . Cancer Mother   . Dementia Mother     Review of Systems  Constitutional: Positive for appetite change (decreased). Negative for chills and fever.  Respiratory: Positive for cough (occ - PND). Negative for shortness of breath and wheezing.   Cardiovascular: Positive for leg swelling (worse since being home). Negative for chest pain and palpitations.  Gastrointestinal: Positive for abdominal pain (cramping) and constipation. Negative for blood in stool (darker stool) and nausea.  Musculoskeletal: Positive for neck pain and neck stiffness.  Neurological: Negative for numbness and headaches.       Objective:   Vitals:   11/16/18 1519  BP: 110/60  Pulse: (!) 116  Resp: 16  Temp: 98.8 F (37.1 C)  SpO2: 98%   BP Readings from Last 3 Encounters:  11/16/18 110/60  11/10/18 (!) 133/55  11/05/18 122/64   Wt Readings from Last 3 Encounters:  11/16/18 143 lb 12.8 oz (65.2 kg)  11/09/18 149 lb 4.8 oz (67.7 kg)  11/05/18 145 lb (65.8 kg)   Body mass index is 25.47 kg/m.   Physical Exam    Constitutional: Appears slightly pale and fatigued. No distress.  HENT:  Head: Normocephalic and atraumatic.  Neck: Neck supple. No tracheal deviation present. No thyromegaly present.  No cervical lymphadenopathy Cardiovascular: Normal rate, irregular  rhythm and normal heart sounds.   2/6 systolic murmur heard. No carotid bruit .  No edema Pulmonary/Chest: Effort normal and breath  sounds normal. No respiratory distress. No has no wheezes. No rales.  Abdomen: soft, mild tenderness across lower abdomen without rebound or guarding, ND Skin: Skin is warm and dry. Not diaphoretic.  Psychiatric: Normal mood and affect. Behavior is normal.   Lab Results  Component Value Date   WBC 4.4 11/10/2018   HGB 8.3 (L) 11/10/2018   HCT 27.4 (L) 11/10/2018   PLT 126 (L) 11/10/2018   GLUCOSE 102 (H) 11/06/2018   ALT 19 11/06/2018   AST 28 11/06/2018   NA 136 11/06/2018   K 3.6 11/06/2018   CL 100 11/06/2018   CREATININE 0.75 11/06/2018   BUN 16 11/06/2018   CO2 26 11/06/2018   TSH 3.86 10/21/2018   INR 2.64 05/21/2018   HGBA1C 5.1 07/09/2017    CT ABDOMEN PELVIS W CONTRAST CLINICAL DATA:  Rectal bleeding, abdominal pain.  EXAM: CT ABDOMEN AND PELVIS WITH CONTRAST  TECHNIQUE: Multidetector CT imaging of the abdomen and pelvis was performed using the standard protocol following bolus administration of intravenous contrast.  CONTRAST:  100mL OMNIPAQUE IOHEXOL 300 MG/ML  SOLN  COMPARISON:  None.  FINDINGS: Lower chest: No acute abnormality.  Hepatobiliary: No focal liver abnormality is seen. Status post cholecystectomy. No biliary dilatation.  Pancreas: Unremarkable. No pancreatic ductal dilatation or surrounding inflammatory changes.  Spleen: Normal in size without focal abnormality.  Adrenals/Urinary Tract: Adrenal glands are unremarkable. Kidneys are normal, without renal calculi, focal lesion, or hydronephrosis. Bladder is unremarkable.  Stomach/Bowel: The stomach appears normal. There is no evidence of bowel obstruction or inflammation. Sigmoid diverticulosis is noted. Status post appendectomy.  Vascular/Lymphatic: Aortic atherosclerosis. No enlarged abdominal or pelvic lymph nodes.  Reproductive: Uterus and bilateral  adnexa are unremarkable.  Other: No abdominal wall hernia or abnormality. No abdominopelvic ascites.  Musculoskeletal: Severe multilevel degenerative disc disease is noted in lower lumbar spine. No acute abnormality is noted.  IMPRESSION: Sigmoid diverticulosis without inflammation.  Severe multilevel degenerative disc disease in lower lumbar spine.  No acute abnormality seen in the abdomen or pelvis.  Aortic Atherosclerosis (ICD10-I70.0).  Electronically Signed   By: Lupita RaiderJames  Green Jr, M.D.   On: 11/06/2018 11:51   EGD and colonoscopy reports reviewed   Assessment & Plan:    See Problem List for Assessment and Plan of chronic medical problems.

## 2018-11-16 ENCOUNTER — Encounter: Payer: Self-pay | Admitting: Internal Medicine

## 2018-11-16 ENCOUNTER — Ambulatory Visit: Payer: Medicare Other | Admitting: Internal Medicine

## 2018-11-16 ENCOUNTER — Other Ambulatory Visit (INDEPENDENT_AMBULATORY_CARE_PROVIDER_SITE_OTHER): Payer: Medicare Other

## 2018-11-16 VITALS — BP 110/60 | HR 116 | Temp 98.8°F | Resp 16 | Ht 63.0 in | Wt 143.8 lb

## 2018-11-16 DIAGNOSIS — I10 Essential (primary) hypertension: Secondary | ICD-10-CM

## 2018-11-16 DIAGNOSIS — D509 Iron deficiency anemia, unspecified: Secondary | ICD-10-CM | POA: Diagnosis not present

## 2018-11-16 DIAGNOSIS — K922 Gastrointestinal hemorrhage, unspecified: Secondary | ICD-10-CM | POA: Diagnosis not present

## 2018-11-16 DIAGNOSIS — M542 Cervicalgia: Secondary | ICD-10-CM | POA: Insufficient documentation

## 2018-11-16 DIAGNOSIS — I482 Chronic atrial fibrillation, unspecified: Secondary | ICD-10-CM | POA: Diagnosis not present

## 2018-11-16 DIAGNOSIS — E538 Deficiency of other specified B group vitamins: Secondary | ICD-10-CM

## 2018-11-16 DIAGNOSIS — K5900 Constipation, unspecified: Secondary | ICD-10-CM | POA: Insufficient documentation

## 2018-11-16 DIAGNOSIS — R609 Edema, unspecified: Secondary | ICD-10-CM

## 2018-11-16 LAB — BASIC METABOLIC PANEL
BUN: 10 mg/dL (ref 6–23)
CHLORIDE: 97 meq/L (ref 96–112)
CO2: 33 mEq/L — ABNORMAL HIGH (ref 19–32)
Calcium: 9.4 mg/dL (ref 8.4–10.5)
Creatinine, Ser: 0.67 mg/dL (ref 0.40–1.20)
GFR: 82.44 mL/min (ref >60.00)
Glucose, Bld: 104 mg/dL — ABNORMAL HIGH (ref 70–99)
Potassium: 3.2 mEq/L — ABNORMAL LOW (ref 3.5–5.1)
Sodium: 138 mEq/L (ref 135–145)

## 2018-11-16 LAB — CBC WITH DIFFERENTIAL/PLATELET
BASOS ABS: 0.1 10*3/uL (ref 0.0–0.1)
Basophils Relative: 0.7 % (ref 0.0–3.0)
EOS ABS: 0.1 10*3/uL (ref 0.0–0.7)
Eosinophils Relative: 0.9 % (ref 0.0–5.0)
HCT: 34 % — ABNORMAL LOW (ref 36.0–46.0)
Hemoglobin: 11.2 g/dL — ABNORMAL LOW (ref 12.0–15.0)
Lymphocytes Relative: 8.2 % — ABNORMAL LOW (ref 12.0–46.0)
Lymphs Abs: 1.1 10*3/uL (ref 0.7–4.0)
MCHC: 33 g/dL (ref 30.0–36.0)
MCV: 89.8 fl (ref 78.0–100.0)
Monocytes Absolute: 0.9 10*3/uL (ref 0.1–1.0)
Monocytes Relative: 7.1 % (ref 3.0–12.0)
Neutro Abs: 11 10*3/uL — ABNORMAL HIGH (ref 1.4–7.7)
Neutrophils Relative %: 83.1 % — ABNORMAL HIGH (ref 43.0–77.0)
Platelets: 195 10*3/uL (ref 150.0–400.0)
RBC: 3.78 Mil/uL — ABNORMAL LOW (ref 3.87–5.11)
RDW: 23.3 % — ABNORMAL HIGH (ref 11.5–15.5)
WBC: 13.3 10*3/uL — ABNORMAL HIGH (ref 4.0–10.5)

## 2018-11-16 MED ORDER — CYANOCOBALAMIN 1000 MCG/ML IJ SOLN
1000.0000 ug | Freq: Once | INTRAMUSCULAR | Status: AC
  Administered 2018-11-16: 1000 ug via INTRAMUSCULAR

## 2018-11-16 NOTE — Assessment & Plan Note (Signed)
Continue monthly Szo shots-we will shots-injection today

## 2018-11-16 NOTE — Assessment & Plan Note (Signed)
EGD and colonoscopy 11/08/2018-had telangiectasia in her colon, which was cauterized No other sites of bleeding Xarelto held and will be restarted today since no sign of GI bleed No bright red blood per stool since leaving the hospital, stools dark which is likely related to iron supplementation CBC, BMP today Follow-up with GI as needed We will continue iron supplementation orally We will arrange for iron infusion #2 for next week

## 2018-11-16 NOTE — Assessment & Plan Note (Signed)
Experiencing decreased bowel movements, constipation and some fecal incontinence Has been told that her anal sphincter was weak Some of her symptoms are likely related to iron supplementation, recent colonoscopy and decreased oral intake She has been eating a low fiber diet-resume higher fiber diet Start Metamucil, can add stool softener if needed

## 2018-11-16 NOTE — Assessment & Plan Note (Signed)
Pain started 2 days ago it is likely muscular in nature No symptoms consistent with radiculopathy Continue Tylenol Heat, ice Continue topical Biofreeze, gentle stretching and massage Call if no improvement

## 2018-11-16 NOTE — Assessment & Plan Note (Signed)
Received 1 unit packed red blood cells Lower GI bleed - telangiectasia and colon that was cauterized, EGD normal Xarelto held, to restart today given no symptoms consistent with active GI bleed Taking iron 2 pills 3 times daily-we will change to 1 pill every day Had one IV iron infusion while in hospital, will arrange for second IV iron infusion next week CBC, BMP today

## 2018-11-16 NOTE — Assessment & Plan Note (Signed)
Left lower extremity more than right lower extremity Continue Lasix 40 mg daily and potassium BMP today Continue elevation Has not been able to wear her compression socks-1 she has do not fit well-advised to try different compression socks-at any minimum try light compression

## 2018-11-16 NOTE — Assessment & Plan Note (Signed)
Blood pressure well controlled Continue current dose of Cardizem BMP

## 2018-11-16 NOTE — Assessment & Plan Note (Signed)
In atrial fibrillation, heart rate slightly elevated but consistent with what she was while in the hospital No evidence of GI bleed-we will restart Xarelto tonight

## 2018-11-17 ENCOUNTER — Ambulatory Visit: Payer: Medicare Other | Admitting: Internal Medicine

## 2018-11-17 ENCOUNTER — Telehealth: Payer: Self-pay

## 2018-11-17 ENCOUNTER — Other Ambulatory Visit: Payer: Self-pay | Admitting: Internal Medicine

## 2018-11-17 ENCOUNTER — Encounter: Payer: Self-pay | Admitting: Internal Medicine

## 2018-11-17 MED ORDER — POTASSIUM CHLORIDE ER 10 MEQ PO TBCR: 10.0000 meq | EXTENDED_RELEASE_TABLET | Freq: Every day | ORAL | 1 refills | Status: DC

## 2018-11-17 NOTE — Telephone Encounter (Signed)
Copied from CRM [id number]. Topic: Quick Communication - Rx Refill/Question >> Nov 17, 2018  3:05 PM Mcneil, Ja-Kwan wrote: Medication: potassium chloride (K-DUR) 10 MEQ tablet  Has the patient contacted their pharmacy? yes   Preferred Pharmacy (with phone number or street name): [id number] - [place], [place] - [address]   [phone] (Phone)  [phone] (Fax)  Agent: Please be advised that RX refills may take up to 3 business days. We ask that you follow-up with your pharmacy.

## 2018-11-17 NOTE — Telephone Encounter (Signed)
Per dr burns, patient needs to be scheduled for iron infusion, order has been written and faxed over to [facility]---patient's appt is Monday 2/17 at 11:00am---I have advised patient's daughter of the appt time, she is checking to make sure patient will have transportation on that day, she will call tamara,RN at [facility] back to confirm---ok to transfer call to [facility] office  Per diane's request, appt has been rescheduled for wed/. 11/24/18 at 11:00am---patient given directions and address to [facility]---order faxed over to patient care center---copy of fax is on tamara's desk

## 2018-11-17 NOTE — Telephone Encounter (Signed)
appt rescheduled for wed 11/24/18 at 11am, diane advised

## 2018-11-17 NOTE — Telephone Encounter (Signed)
Grace Alvarado said she was going to see if she could do this Wednesday , February 19th, no earlier than 10 am and finished by 1:30 pm, call back @ [phone]

## 2018-11-17 NOTE — Telephone Encounter (Signed)
Requested medication (s) are due for refill today: yes  Requested medication (s) are on the active medication list: yes    Last refill: 10/23/2018  #30  0 refills  Future visit scheduled     Yes  12/23/2018  Notes to clinic Historical provider  Requested Prescriptions  Pending Prescriptions Disp Refills   potassium chloride (K-DUR) 10 MEQ tablet 30 tablet 0    Sig: Take 1 tablet (10 mEq total) by mouth daily.     Endocrinology:  Minerals - Potassium Supplementation Failed - 11/17/2018  3:08 PM      Failed - K in normal range and within 360 days    Potassium  Date Value Ref Range Status  11/16/2018 3.2 (L) 3.5 - 5.1 mEq/L Final         Passed - Cr in normal range and within 360 days    Creatinine, Ser  Date Value Ref Range Status  11/16/2018 0.67 0.40 - 1.20 mg/dL Final         Passed - Valid encounter within last 12 months    Recent Outpatient Visits          Yesterday Iron deficiency anemia, unspecified iron deficiency anemia type   [facility] -Kieth Brightly, [facility]   1 week ago Rectal bleeding   [facility] -Kieth Brightly, MD   2 weeks ago Essential hypertension   [facility] -Kieth Brightly, MD   3 weeks ago Other iron deficiency anemia   [facility] Amor, Revonda Standard, MD   3 weeks ago Bilateral lower extremity edema   [facility] Amor, Revonda Standard, MD      Future Appointments            In 1 month Burns, Bobette Mo, MD [facility], [facility]   In 5 months Burns, Bobette Mo, MD [facility] -[place], [facility]

## 2018-11-18 ENCOUNTER — Other Ambulatory Visit: Payer: Self-pay

## 2018-11-18 MED ORDER — POTASSIUM CHLORIDE ER 10 MEQ PO TBCR: 10.0000 meq | EXTENDED_RELEASE_TABLET | Freq: Every day | ORAL | 1 refills | Status: DC

## 2018-11-22 ENCOUNTER — Encounter: Payer: Self-pay | Admitting: Internal Medicine

## 2018-11-22 ENCOUNTER — Encounter (HOSPITAL_COMMUNITY): Payer: Medicare Other

## 2018-11-24 ENCOUNTER — Encounter (HOSPITAL_COMMUNITY): Payer: Self-pay

## 2018-11-24 ENCOUNTER — Telehealth: Payer: Self-pay

## 2018-11-24 ENCOUNTER — Encounter: Payer: Self-pay | Admitting: Internal Medicine

## 2018-11-24 ENCOUNTER — Ambulatory Visit (HOSPITAL_COMMUNITY): Admission: RE | Admit: 2018-11-24 | Payer: Medicare Other | Source: Ambulatory Visit

## 2018-11-24 NOTE — Progress Notes (Signed)
Patient was scheduled at 11AM in [facility] for "IV Feraheme"  Order received was for Iron Dextran which requires at least 6 hours for total administration if no complications.  Spoke with the office staff to inquire about using a shorter infusion time iron such as Feraheme or Injectafer and was informed that Dr. Lawerance Bach was out of the office until next week.  When I spoke with the patient she stated that she was informed that the infusion would only take an hour.  I shared that the infusion time would be at least 6 total hours and she was visibly upset.  She did not want to stay for that period of time.  She wishes for [place] to obtain order for a shorter duration infusion and she will return at that time.  I spoke with Delaney Meigs at the office and she will relay the information to Dr. Lawerance Bach upon her return next week.

## 2018-11-24 NOTE — Telephone Encounter (Signed)
Copied from CRM [id number]. Topic: General - Other >> Nov 24, 2018 11:39 AM Burchel, Abbi R wrote: Brett Canales [address] [facility]) requesting a call back from [place].    [phone] >> Nov 24, 2018 11:42 AM Morphies, Hermine Messick wrote: Needs to speak to [facility].  According to steve/infusion ctr---this type of infusion takes at least 6 hours to complete---infusion scheduler arranged appt for another type of infusion that only takes 2 hours---so today's appt was scheduled too late in the morning to complete today--we are leaving order as original order infusion request by dr burns (dr burns not in office today) and patient will come back tomorrow morning early, so that infusion can be completed same day

## 2018-11-26 ENCOUNTER — Ambulatory Visit: Payer: Medicare Other | Admitting: Family Medicine

## 2018-11-26 ENCOUNTER — Ambulatory Visit: Payer: Medicare Other | Admitting: Gastroenterology

## 2018-11-29 ENCOUNTER — Ambulatory Visit: Payer: Medicare Other | Admitting: Family Medicine

## 2018-11-29 NOTE — Telephone Encounter (Signed)
Pt daughter called about the iron infusion reschedule and asked that if possible the shorter infusion be ordered for her mom/ please advise and call Angelique Blonder @ cb# [phone]

## 2018-11-30 ENCOUNTER — Encounter: Payer: Self-pay | Admitting: Internal Medicine

## 2018-11-30 ENCOUNTER — Ambulatory Visit: Payer: Medicare Other | Admitting: Internal Medicine

## 2018-11-30 ENCOUNTER — Ambulatory Visit: Payer: Medicare Other | Admitting: Gastroenterology

## 2018-11-30 NOTE — Telephone Encounter (Signed)
Patient's daughter Angelique Blonder would like Delaney Meigs to call her back today, she said her mother needs to have this infusion done asap/ thanks.

## 2018-12-01 ENCOUNTER — Telehealth: Payer: Self-pay

## 2018-12-01 ENCOUNTER — Other Ambulatory Visit (HOSPITAL_COMMUNITY): Payer: Self-pay

## 2018-12-01 NOTE — Telephone Encounter (Signed)
Copied from CRM [id number]. Topic: General - Other >> Dec 01, 2018  9:34 AM Angela Nevin wrote: Reason for CRM: See mychart message from 2/25. Patient's daughter Angelique Blonder requesting call back from [place] or [place].    Contact # [phone]  Pt daughter is to check on insurance coverage for infusion cost and call back with decision on which iron infusion she would like to do----needs to talk with Tamara,RN at [facility] office

## 2018-12-01 NOTE — Telephone Encounter (Signed)
For iron infusion patient needs----I have talked with [name]/pt daughter---she is checking with mother's insurance coverage to see which iron infusion is covered/affordable based on pt insurance plan----she is to call tamara,RN at [facility] office back ----please transfer call to tamara,RN at [facility] office

## 2018-12-01 NOTE — Telephone Encounter (Signed)
Pt's daughter Angelique Blonder called back to speak with Delaney Meigs. Please advise. [phone]

## 2018-12-02 ENCOUNTER — Ambulatory Visit (HOSPITAL_COMMUNITY)
Admission: RE | Admit: 2018-12-02 | Discharge: 2018-12-02 | Disposition: A | Discharge status: Home / Self Care | Payer: Medicare Other | Source: Ambulatory Visit | Attending: Internal Medicine | Admitting: Internal Medicine

## 2018-12-02 DIAGNOSIS — D509 Iron deficiency anemia, unspecified: Secondary | ICD-10-CM | POA: Diagnosis not present

## 2018-12-02 MED ORDER — SODIUM CHLORIDE 0.9 % IV SOLN
510.0000 mg | Freq: Once | INTRAVENOUS | Status: AC
  Administered 2018-12-02: 510 mg via INTRAVENOUS
  Filled 2018-12-02: qty 17

## 2018-12-02 MED ORDER — SODIUM CHLORIDE 0.9 % IV SOLN
INTRAVENOUS | Status: DC | PRN
  Administered 2018-12-02: 250 mL via INTRAVENOUS

## 2018-12-02 NOTE — Progress Notes (Signed)
Patient received Feraheme via a PIV. Observed for at least 30 minutes post infusion.Tolerated well, vitals stable, discharge instructions given, verbalized understanding. Patient alert, oriented and ambulatory at the time of discharge. Patient and daughter left together.

## 2018-12-02 NOTE — Discharge Instructions (Signed)

## 2018-12-02 NOTE — Telephone Encounter (Signed)
New order/new appt made---see other phone notes

## 2018-12-03 ENCOUNTER — Ambulatory Visit: Payer: Medicare Other | Admitting: Cardiology

## 2018-12-09 ENCOUNTER — Ambulatory Visit (HOSPITAL_COMMUNITY)
Admission: RE | Admit: 2018-12-09 | Discharge: 2018-12-09 | Disposition: A | Discharge status: Home / Self Care | Payer: Medicare Other | Source: Ambulatory Visit | Attending: Internal Medicine | Admitting: Internal Medicine

## 2018-12-09 DIAGNOSIS — D509 Iron deficiency anemia, unspecified: Secondary | ICD-10-CM | POA: Diagnosis present

## 2018-12-09 DIAGNOSIS — D500 Iron deficiency anemia secondary to blood loss (chronic): Secondary | ICD-10-CM | POA: Diagnosis not present

## 2018-12-09 MED ORDER — SODIUM CHLORIDE 0.9 % IV SOLN
INTRAVENOUS | Status: DC | PRN
  Administered 2018-12-09: 250 mL via INTRAVENOUS

## 2018-12-09 MED ORDER — SODIUM CHLORIDE 0.9 % IV SOLN
510.0000 mg | Freq: Once | INTRAVENOUS | Status: AC
  Administered 2018-12-09: 510 mg via INTRAVENOUS
  Filled 2018-12-09: qty 17

## 2018-12-09 NOTE — Progress Notes (Signed)
PATIENT CARE CENTER NOTE  Diagnosis: Iron Deficiency Anemia due to GI bleed   Provider: Eileen Stanford, MD   Procedure: IV Feraheme    Note: Patient received second Feraheme infusion. Tolerated well with no adverse reaction. Observed patient for 30 minutes post-infusion. Vital signs stable. Discharge instructions given. Patient alert, oriented and ambulatory at discharge.

## 2018-12-09 NOTE — Discharge Instructions (Signed)

## 2018-12-11 ENCOUNTER — Other Ambulatory Visit: Payer: Self-pay | Admitting: Family Medicine

## 2018-12-17 NOTE — Telephone Encounter (Signed)
Pt called in to follow up on refill request for this medication. Pt says that this Rx should have been sent to Dr. Lawerance Bach instead.

## 2018-12-18 ENCOUNTER — Encounter: Payer: Self-pay | Admitting: Internal Medicine

## 2018-12-20 MED ORDER — LEVOTHYROXINE SODIUM 75 MCG PO TABS: 75.0000 ug | ORAL_TABLET | Freq: Every day | ORAL | 3 refills | Status: DC

## 2018-12-20 MED ORDER — PRAVASTATIN SODIUM 10 MG PO TABS: 10.0000 mg | ORAL_TABLET | Freq: Every day | ORAL | 3 refills | Status: DC

## 2018-12-20 MED ORDER — RIVAROXABAN 20 MG PO TABS: 20.0000 mg | ORAL_TABLET | Freq: Every day | ORAL | 5 refills | Status: DC

## 2018-12-21 ENCOUNTER — Encounter: Payer: Self-pay | Admitting: Internal Medicine

## 2018-12-23 ENCOUNTER — Ambulatory Visit: Payer: Medicare Other | Admitting: Internal Medicine

## 2019-01-29 ENCOUNTER — Encounter: Payer: Self-pay | Admitting: Internal Medicine

## 2019-03-13 ENCOUNTER — Encounter: Payer: Self-pay | Admitting: Internal Medicine

## 2019-03-13 DIAGNOSIS — R609 Edema, unspecified: Secondary | ICD-10-CM

## 2019-03-14 MED ORDER — FUROSEMIDE 20 MG PO TABS: 20.0000 mg | ORAL_TABLET | Freq: Every day | ORAL | 3 refills | Status: DC

## 2019-03-14 MED ORDER — POTASSIUM CHLORIDE ER 10 MEQ PO TBCR: 10.0000 meq | EXTENDED_RELEASE_TABLET | Freq: Every day | ORAL | 1 refills | Status: DC

## 2019-03-23 ENCOUNTER — Encounter: Payer: Self-pay | Admitting: Internal Medicine

## 2019-04-26 ENCOUNTER — Encounter: Payer: Self-pay | Admitting: Internal Medicine

## 2019-04-27 ENCOUNTER — Encounter: Payer: Self-pay | Admitting: Internal Medicine

## 2019-04-28 ENCOUNTER — Telehealth: Payer: Self-pay

## 2019-04-28 NOTE — Telephone Encounter (Signed)
Please call her daughter.  Please let her know it is very difficult for me to make calls because I am seeing patients all day and have obligations in the evening.  Let her know ideally it would be good to have her come to the office for follow-up and to get blood work done since her last blood work was February.  If her mother and her do not feel comfortable having her mother come to the office we can postpone the visit.  Reassure her that they would be bleeding in the car until her appointment and then we will get them directly into an exam room and then down to the lab.

## 2019-04-28 NOTE — Telephone Encounter (Signed)
Copied from [place] [id number]. Topic: General - Other >> Apr 28, 2019 10:26 AM Sheran Luz wrote: Patient's daughter, Langley Gauss, requesting call back from Dr. Quay Burow regarding upcoming appointment on 7/28. She states that she does not want to speak with CMA.

## 2019-04-29 NOTE — Telephone Encounter (Signed)
Informed through mychart 

## 2019-05-01 ENCOUNTER — Encounter: Payer: Self-pay | Admitting: Internal Medicine

## 2019-05-01 DIAGNOSIS — R739 Hyperglycemia, unspecified: Secondary | ICD-10-CM | POA: Insufficient documentation

## 2019-05-01 NOTE — Progress Notes (Signed)
Subjective:    Patient ID: Grace Alvarado, female    DOB: April 21, 1925, 85 y.o.   MRN: [id number]  HPI The patient is here for follow up.  Chronic Afib, MR, TR, Hypertension: She is taking her medication daily. She is compliant with a low sodium diet.  She denies chest pain, palpitations, edema, shortness of breath and regular headaches. She does not monitor her blood pressure at home.    Hypothyroidism:  She is taking her medication daily.  She denies any recent changes in energy or weight that are unexplained.   Hyperlipidemia: She is taking her medication daily. She is compliant with a low fat/cholesterol diet. She denies myalgias.   Iron def anemia, h/o of LBIB d/t AVM:    Medications and allergies reviewed with patient and updated if appropriate.  Patient Active Problem List   Diagnosis Date Noted  . Neck pain, musculoskeletal 11/16/2018  . Constipation 11/16/2018  . Hypokalemia   . AVM (arteriovenous malformation) of colon   . Lower GI bleed 11/06/2018  . Rectal bleeding 11/05/2018  . Iron deficiency anemia 11/02/2018  . Lymphedema of left lower extremity 10/22/2018  . B12 deficiency 05/27/2018  . Traumatic hematoma of right knee 05/25/2018  . Dependent edema 05/25/2018  . Cellulitis 05/22/2018  . GERD (gastroesophageal reflux disease) 05/20/2018  . Pulmonary hypertension, primary (HCC) 05/20/2018  . Hyperlipidemia 05/20/2018  . CHF (congestive heart failure) (HCC) 05/20/2018  . Tricuspid regurgitation 05/17/2018  . Mitral regurgitation 05/17/2018  . Chronic a-fib 05/17/2018  . Essential hypertension 05/17/2018  . Hypothyroid 05/17/2018  . CAD (coronary artery disease) 05/17/2018  . Carotid atherosclerosis, bilateral 05/17/2018    Current Outpatient Medications on File Prior to Visit  Medication Sig Dispense Refill  . cholecalciferol (VITAMIN D3) 25 MCG (1000 UT) tablet Take 1,000 Units by mouth daily.    Marland Kitchen. diltiazem (CARDIZEM SR) 120 MG 12 hr capsule TAKE 1  CAPSULE (120 MG TOTAL) BY MOUTH 2 (TWO) TIMES DAILY. 180 capsule 1  . ferrous gluconate (FERGON) 324 MG tablet Take 2 tablets (648 mg total) by mouth every Monday, Wednesday, and Friday. 90 tablet 0  . furosemide (LASIX) 20 MG tablet Take 1 tablet (20 mg total) by mouth daily. 90 tablet 3  . levothyroxine (SYNTHROID, LEVOTHROID) 75 MCG tablet Take 1 tablet (75 mcg total) by mouth daily before breakfast. 90 tablet 3  . Multiple Vitamin (VITAMIN E/FOLIC ACID/B-6/B-12 PO) Take 1 tablet by mouth daily.    . potassium chloride (K-DUR) 10 MEQ tablet Take 1 tablet (10 mEq total) by mouth daily. 90 tablet 1  . pravastatin (PRAVACHOL) 10 MG tablet Take 1 tablet (10 mg total) by mouth daily. 90 tablet 3  . Probiotic Product (PRO-BIOTIC BLEND PO) Take 1 capsule by mouth daily.    . rivaroxaban (XARELTO) 20 MG TABS tablet Take 1 tablet (20 mg total) by mouth daily with supper. Hold xarelto until 2/11 30 tablet 5  . vitamin C (ASCORBIC ACID) 500 MG tablet Take 500 mg by mouth daily.     No current facility-administered medications on file prior to visit.     Past Medical History:  Diagnosis Date  . Atrial fibrillation (HCC)   . Hyperlipidemia   . Hypertension   . Hypothyroidism   . Mitral regurgitation   . MVP (mitral valve prolapse)     Past Surgical History:  Procedure Laterality Date  . APPENDECTOMY    . c section    . CESAREAN SECTION    .  CHOLECYSTECTOMY    . COLONOSCOPY WITH PROPOFOL N/A 11/09/2018   Procedure: COLONOSCOPY WITH PROPOFOL;  Surgeon: Doran Stabler, MD;  Location: [facility] [facility];  Service: Gastroenterology;  Laterality: N/A;  . ESOPHAGOGASTRODUODENOSCOPY (EGD) WITH PROPOFOL N/A 11/09/2018   Procedure: ESOPHAGOGASTRODUODENOSCOPY (EGD) WITH PROPOFOL;  Surgeon: Doran Stabler, MD;  Location: [facility];  Service: Gastroenterology;  Laterality: N/A;  . HOT HEMOSTASIS N/A 11/09/2018   Procedure: HOT HEMOSTASIS (ARGON PLASMA COAGULATION/BICAP);  Surgeon: Doran Stabler, MD;   Location: [facility];  Service: Gastroenterology;  Laterality: N/A;  . SUBMUCOSAL INJECTION  11/09/2018   Procedure: SUBMUCOSAL INJECTION;  Surgeon: Doran Stabler, MD;  Location: [facility] ENDOSCOPY;  Service: Gastroenterology;;    Social History   Socioeconomic History  . Marital status: Widowed    Spouse name: Not on file  . Number of children: 3  . Years of education: Not on file  . Highest education level: Not on file  Occupational History  . Occupation: retired  Scientific laboratory technician  . Financial resource strain: Not on file  . Food insecurity    Worry: Not on file    Inability: Not on file  . Transportation needs    Medical: Not on file    Non-medical: Not on file  Tobacco Use  . Smoking status: Former Research scientist (life sciences)  . Smokeless tobacco: Never Used  Substance and Sexual Activity  . Alcohol use: Not Currently  . Drug use: Never  . Sexual activity: Not Currently  Lifestyle  . Physical activity    Days per week: Not on file    Minutes per session: Not on file  . Stress: Not on file  Relationships  . Social Herbalist on phone: Not on file    Gets together: Not on file    Attends religious service: Not on file    Active member of club or organization: Not on file    Attends meetings of clubs or organizations: Not on file    Relationship status: Not on file  Other Topics Concern  . Not on file  Social History Narrative  . Not on file    Family History  Problem Relation Age of Onset  . Cancer Mother   . Dementia Mother     Review of Systems     Objective:  There were no vitals filed for this visit. BP Readings from Last 3 Encounters:  12/09/18 111/88  12/02/18 124/60  11/16/18 110/60   Wt Readings from Last 3 Encounters:  11/16/18 143 lb 12.8 oz (65.2 kg)  11/09/18 149 lb 4.8 oz (67.7 kg)  11/05/18 145 lb (65.8 kg)   There is no height or weight on file to calculate BMI.   Physical Exam    Constitutional: Appears well-developed and well-nourished. No  distress.  HENT:  Head: Normocephalic and atraumatic.  Neck: Neck supple. No tracheal deviation present. No thyromegaly present.  No cervical lymphadenopathy Cardiovascular: Normal rate, regular rhythm and normal heart sounds.   No murmur heard. No carotid bruit .  No edema Pulmonary/Chest: Effort normal and breath sounds normal. No respiratory distress. No has no wheezes. No rales.  Skin: Skin is warm and dry. Not diaphoretic.  Psychiatric: Normal mood and affect. Behavior is normal.      Assessment & Plan:    See Problem List for Assessment and Plan of chronic medical problems.   This encounter was created in error - please disregard.

## 2019-05-01 NOTE — Patient Instructions (Signed)

## 2019-05-03 ENCOUNTER — Encounter: Payer: Medicare Other | Admitting: Internal Medicine

## 2019-05-08 ENCOUNTER — Other Ambulatory Visit: Payer: Self-pay | Admitting: Family Medicine

## 2019-05-11 ENCOUNTER — Other Ambulatory Visit: Payer: Self-pay | Admitting: Internal Medicine

## 2019-05-23 ENCOUNTER — Other Ambulatory Visit: Payer: Self-pay

## 2019-05-23 MED ORDER — RIVAROXABAN 20 MG PO TABS: 20.0000 mg | ORAL_TABLET | Freq: Every day | ORAL | 0 refills | Status: DC

## 2019-05-25 ENCOUNTER — Encounter: Payer: Self-pay | Admitting: Internal Medicine

## 2019-05-25 NOTE — Telephone Encounter (Signed)
Pt daughter called back and asked that her [facility] message be forwarded to Dr. Quay Burow because the maintenance men are scheduled to go to her mother's apartment tomorrow morning and they need a letter asap.

## 2019-05-31 ENCOUNTER — Encounter: Payer: Self-pay | Admitting: Internal Medicine

## 2019-06-01 ENCOUNTER — Encounter: Payer: Self-pay | Admitting: Internal Medicine

## 2019-06-02 ENCOUNTER — Encounter: Payer: Self-pay | Admitting: Internal Medicine

## 2019-06-02 NOTE — Progress Notes (Deleted)
HPI: FU atrial fibrillation. [place]Patient with history of permanent atrial fibrillation and MVP/MR. Patient apparently made it clear previously that she did not want valve surgery. Previously failed amiodarone and multaq per records. Nuclear study 2012 showed no ischemia with normal LV function. Lower extremity venous Dopplers January 2020 showed no DVT.    Most recent echocardiogram January 2020 showed ejection fraction 60 to 65%, biatrial enlargement, moderate to severe mitral regurgitation.  Had GI bleed February 2020.  Colonoscopy revealed angioectasia and diverticulosis.  Since last seen   Current Outpatient Medications  Medication Sig Dispense Refill   cholecalciferol (VITAMIN D3) 25 MCG (1000 UT) tablet Take 1,000 Units by mouth daily.     diltiazem (CARDIZEM SR) 120 MG 12 hr capsule TAKE 1 CAPSULE (120 MG TOTAL) BY MOUTH 2 (TWO) TIMES DAILY. 180 capsule 1   ferrous gluconate (FERGON) 324 MG tablet Take 2 tablets (648 mg total) by mouth every Monday, Wednesday, and Friday. 90 tablet 0   fluticasone (FLONASE) 50 MCG/ACT nasal spray SPRAY 2 SPRAYS INTO EACH NOSTRIL EVERY DAY 48 mL 2   furosemide (LASIX) 20 MG tablet Take 1 tablet (20 mg total) by mouth daily. 90 tablet 3   levothyroxine (SYNTHROID, LEVOTHROID) 75 MCG tablet Take 1 tablet (75 mcg total) by mouth daily before breakfast. 90 tablet 3   Multiple Vitamin (VITAMIN E/FOLIC ACID/B-6/B-12 PO) Take 1 tablet by mouth daily.     potassium chloride (K-DUR) 10 MEQ tablet TAKE 1 TABLET (10 MEQ) TWICE DAILY. 180 tablet 0   pravastatin (PRAVACHOL) 10 MG tablet Take 1 tablet (10 mg total) by mouth daily. 90 tablet 3   Probiotic Product (PRO-BIOTIC BLEND PO) Take 1 capsule by mouth daily.     rivaroxaban (XARELTO) 20 MG TABS tablet Take 1 tablet (20 mg total) by mouth daily with supper. Hold xarelto until 2/11 90 tablet 0   vitamin C (ASCORBIC ACID) 500 MG tablet Take 500 mg by mouth daily.      No current facility-administered medications for this visit.      Past Medical History:  Diagnosis Date   Atrial fibrillation (HCC)    Hyperlipidemia    Hypertension    Hypothyroidism    Mitral regurgitation    MVP (mitral valve prolapse)     Past Surgical History:  Procedure Laterality Date   APPENDECTOMY     c section     CESAREAN SECTION     CHOLECYSTECTOMY     COLONOSCOPY WITH PROPOFOL N/A 11/09/2018   Procedure: COLONOSCOPY WITH PROPOFOL;  Surgeon: Sherrilyn Ristanis, Henry L III, MD;  [facility];  Service: Gastroenterology;  Laterality: N/A;   ESOPHAGOGASTRODUODENOSCOPY (EGD) WITH PROPOFOL N/A 11/09/2018   Procedure: ESOPHAGOGASTRODUODENOSCOPY (EGD) WITH PROPOFOL;  Surgeon: Sherrilyn Ristanis, Henry L III, MD;  [facility];  Service: Gastroenterology;  Laterality: N/A;   HOT HEMOSTASIS N/A 11/09/2018   Procedure: HOT HEMOSTASIS (ARGON PLASMA COAGULATION/BICAP);  Surgeon: Sherrilyn Ristanis, Henry L III, MD;  [facility];  Service: Gastroenterology;  Laterality: N/A;   SUBMUCOSAL INJECTION  11/09/2018   Procedure: SUBMUCOSAL INJECTION;  Surgeon: Sherrilyn Ristanis, Henry L III, MD;  [facility];  Service: Gastroenterology;;    Social History   Socioeconomic History   Marital status: Widowed    Spouse name: Not on file   Number of children: 3   Years of education: Not on file   Highest education level: Not on file  Occupational History   Occupation: retired  Chief Executive Officerocial Needs  Financial resource strain: Not on file   Food insecurity    Worry: Not on file    Inability: Not on file   Transportation needs    Medical: Not on file    Non-medical: Not on file  Tobacco Use   Smoking status: Former Smoker   Smokeless tobacco: Never Used  Substance and Sexual Activity   Alcohol use: Not Currently   Drug use: Never   Sexual activity: Not Currently  Lifestyle   Physical activity    Days per week: Not on file    Minutes per session: Not on file    Stress: Not on file  Relationships   Social connections    Talks on phone: Not on file    Gets together: Not on file    Attends religious service: Not on file    Active member of club or organization: Not on file    Attends meetings of clubs or organizations: Not on file    Relationship status: Not on file   Intimate partner violence    Fear of current or ex partner: Not on file    Emotionally abused: Not on file    Physically abused: Not on file    Forced sexual activity: Not on file  Other Topics Concern   Not on file  Social History Narrative   Not on file    Family History  Problem Relation Age of Onset   Cancer Mother    Dementia Mother     ROS: no fevers or chills, productive cough, hemoptysis, dysphasia, odynophagia, melena, hematochezia, dysuria, hematuria, rash, seizure activity, orthopnea, PND, pedal edema, claudication. Remaining systems are negative.  Physical Exam: Well-developed well-nourished in no acute distress.  Skin is warm and dry.  HEENT is normal.  Neck is supple.  Chest is clear to auscultation with normal expansion.  Cardiovascular exam is regular rate and rhythm.  Abdominal exam nontender or distended. No masses palpated. Extremities show no edema. neuro grossly intact  ECG- personally reviewed  A/P  1 permanent atrial fibrillation-plan to continue present dose of Cardizem for rate control.  Continue Xarelto.  2 mitral valve prolapse-patient has mitral valve prolapse with severe mitral regurgitation.  Given age would like to be conservative.  We will plan follow-up echoes in the future.  3 hypertension-patient's blood pressure is controlled.  Continue present medications and follow.  4 hyperlipidemia-continue statin.  5 lower extremity edema-this is previously been felt secondary to venous insufficiency.  Continue present dose of Lasix.  Check potassium and renal function.  Kirk Ruths, MD

## 2019-06-03 NOTE — Telephone Encounter (Signed)
Left message for patient to call back to schedule appointment.

## 2019-06-05 ENCOUNTER — Encounter: Payer: Self-pay | Admitting: Internal Medicine

## 2019-06-05 NOTE — Progress Notes (Signed)
Subjective:    Patient ID: Grace Alvarado, female    DOB: 03-Jun-1925, 85 y.o.   MRN: [id number]  HPI The patient is here for follow up.  She is exercising regularly - she walks about 5000 steps a day in her apartment and does some dancing.     Chronic Afib, MR, TR, Hypertension: She is taking her medication daily. She is compliant with a low sodium diet.  She denies chest pain, palpitations, shortness of breath and regular headaches. Her leg edema has been minimal.  It does increase during the day but resolves over night.   Hypothyroidism:  She is taking her medication daily.  She denies any recent changes in energy or weight that are unexplained.   Hyperlipidemia: She is taking her medication daily. She is compliant with a low fat/cholesterol diet. She denies myalgias.   Iron def anemia, h/o of LBIB d/t AVM:  She denies any abdominal pain, bood in the stool or black stool.  She denies SOB or lightheadedness.  Her energy level is good.    B12 deficiency: She is always been treated with B12 injections on a monthly basis, but with the past COVID pandemic she has not wanted to come and has been taking a B complex vitamin.  Medications and allergies reviewed with patient and updated if appropriate.  Patient Active Problem List   Diagnosis Date Noted  . Hyperglycemia 05/01/2019  . Neck pain, musculoskeletal 11/16/2018  . Constipation 11/16/2018  . Hypokalemia   . AVM (arteriovenous malformation) of colon   . Lower GI bleed 11/06/2018  . Iron deficiency anemia 11/02/2018  . Lymphedema of left lower extremity 10/22/2018  . B12 deficiency 05/27/2018  . Traumatic hematoma of right knee 05/25/2018  . Dependent edema 05/25/2018  . Cellulitis 05/22/2018  . Pulmonary hypertension, primary ([place]) 05/20/2018  . Hyperlipidemia 05/20/2018  . CHF (congestive heart failure) ([place]) 05/20/2018  . Tricuspid regurgitation 05/17/2018  . Mitral regurgitation 05/17/2018  . Chronic a-fib 05/17/2018  .  Essential hypertension 05/17/2018  . Hypothyroid 05/17/2018  . CAD (coronary artery disease) 05/17/2018  . Carotid atherosclerosis, bilateral 05/17/2018    Current Outpatient Medications on File Prior to Visit  Medication Sig Dispense Refill  . cholecalciferol (VITAMIN D3) 25 MCG (1000 UT) tablet Take 1,000 Units by mouth daily.    Marland Kitchen diltiazem (CARDIZEM SR) 120 MG 12 hr capsule TAKE 1 CAPSULE (120 MG TOTAL) BY MOUTH 2 (TWO) TIMES DAILY. 180 capsule 1  . ferrous gluconate (FERGON) 324 MG tablet Take 2 tablets (648 mg total) by mouth every Monday, Wednesday, and Friday. 90 tablet 0  . fluticasone (FLONASE) 50 MCG/ACT nasal spray SPRAY 2 SPRAYS INTO EACH NOSTRIL EVERY DAY 48 mL 2  . furosemide (LASIX) 20 MG tablet Take 1 tablet (20 mg total) by mouth daily. 90 tablet 3  . levothyroxine (SYNTHROID, LEVOTHROID) 75 MCG tablet Take 1 tablet (75 mcg total) by mouth daily before breakfast. 90 tablet 3  . Multiple Vitamin (VITAMIN E/FOLIC INOM/V-6/H-20 PO) Take 1 tablet by mouth daily.    . potassium chloride (K-DUR) 10 MEQ tablet TAKE 1 TABLET (10 MEQ) TWICE DAILY. 180 tablet 0  . pravastatin (PRAVACHOL) 10 MG tablet Take 1 tablet (10 mg total) by mouth daily. 90 tablet 3  . Probiotic Product (PRO-BIOTIC BLEND PO) Take 1 capsule by mouth daily.    . rivaroxaban (XARELTO) 20 MG TABS tablet Take 1 tablet (20 mg total) by mouth daily with supper. Hold xarelto until 2/11 90 tablet  0  . vitamin C (ASCORBIC ACID) 500 MG tablet Take 500 mg by mouth daily.     No current facility-administered medications on file prior to visit.     Past Medical History:  Diagnosis Date  . Atrial fibrillation (HCC)   . Hyperlipidemia   . Hypertension   . Hypothyroidism   . Mitral regurgitation   . MVP (mitral valve prolapse)     Past Surgical History:  Procedure Laterality Date  . APPENDECTOMY    . c section    . CESAREAN SECTION    . CHOLECYSTECTOMY    . COLONOSCOPY WITH PROPOFOL N/A 11/09/2018   Procedure:  COLONOSCOPY WITH PROPOFOL;  Surgeon: Sherrilyn Ristanis, Henry L III, MD;  [facility];  Service: Gastroenterology;  Laterality: N/A;  . ESOPHAGOGASTRODUODENOSCOPY (EGD) WITH PROPOFOL N/A 11/09/2018   Procedure: ESOPHAGOGASTRODUODENOSCOPY (EGD) WITH PROPOFOL;  Surgeon: Sherrilyn Ristanis, Henry L III, MD;  [facility];  Service: Gastroenterology;  Laterality: N/A;  . HOT HEMOSTASIS N/A 11/09/2018   Procedure: HOT HEMOSTASIS (ARGON PLASMA COAGULATION/BICAP);  Surgeon: Sherrilyn Ristanis, Henry L III, MD;  [facility];  Service: Gastroenterology;  Laterality: N/A;  . SUBMUCOSAL INJECTION  11/09/2018   Procedure: SUBMUCOSAL INJECTION;  Surgeon: Sherrilyn Ristanis, Henry L III, MD;  [facility];  Service: Gastroenterology;;    Social History   Socioeconomic History  . Marital status: Widowed    Spouse name: Not on file  . Number of children: 3  . Years of education: Not on file  . Highest education level: Not on file  Occupational History  . Occupation: retired  Engineer, productionocial Needs  . Financial resource strain: Not on file  . Food insecurity    Worry: Not on file    Inability: Not on file  . Transportation needs    Medical: Not on file    Non-medical: Not on file  Tobacco Use  . Smoking status: Former Games developermoker  . Smokeless tobacco: Never Used  Substance and Sexual Activity  . Alcohol use: Not Currently  . Drug use: Never  . Sexual activity: Not Currently  Lifestyle  . Physical activity    Days per week: Not on file    Minutes per session: Not on file  . Stress: Not on file  Relationships  . Social Musicianconnections    Talks on phone: Not on file    Gets together: Not on file    Attends religious service: Not on file    Active member of club or organization: Not on file    Attends meetings of clubs or organizations: Not on file    Relationship status: Not on file  Other Topics Concern  . Not on file  Social History Narrative  . Not on file    Family History  Problem Relation Age of Onset  .  Cancer Mother   . Dementia Mother     Review of Systems  Constitutional: Negative for chills, fatigue and fever.  Respiratory: Negative for cough, shortness of breath and wheezing.   Cardiovascular: Positive for leg swelling (sometimes ankle swelling). Negative for chest pain and palpitations.  Gastrointestinal: Negative for abdominal pain, blood in stool (no black stool), constipation, diarrhea and nausea.  Neurological: Positive for headaches (related to allergies - in morning only). Negative for light-headedness.  Psychiatric/Behavioral: Negative for dysphoric mood. The patient is not nervous/anxious.        Objective:   Vitals:   06/07/19 0822  BP: 118/62  Pulse: 96  Resp: 16  Temp: 98.2 F (36.8  C)  SpO2: 99%   BP Readings from Last 3 Encounters:  06/07/19 118/62  12/09/18 111/88  12/02/18 124/60   Wt Readings from Last 3 Encounters:  06/07/19 135 lb 1.9 oz (61.3 kg)  11/16/18 143 lb 12.8 oz (65.2 kg)  11/09/18 149 lb 4.8 oz (67.7 kg)   Body mass index is 23.94 kg/m.   Physical Exam    Constitutional: Appears well-developed and well-nourished. No distress.  HENT:  Head: Normocephalic and atraumatic.  Neck: Neck supple. No tracheal deviation present. No thyromegaly present.  No cervical lymphadenopathy Cardiovascular: Normal rate, irregular rhythm and normal heart sounds.  2/6 systolic murmur heard. No carotid bruit .  Trace bilateral ankle edema Pulmonary/Chest: Effort normal and breath sounds normal. No respiratory distress. No has no wheezes. No rales.  Abdomen: sot, NT, ND Skin: Skin is warm and dry. Not diaphoretic.  Psychiatric: Normal mood and affect. Behavior is normal.      Assessment & Plan:    See Problem List for Assessment and Plan of chronic medical problems.

## 2019-06-06 ENCOUNTER — Encounter: Payer: Self-pay | Admitting: Internal Medicine

## 2019-06-06 ENCOUNTER — Ambulatory Visit: Payer: Medicare Other | Admitting: Cardiology

## 2019-06-07 ENCOUNTER — Other Ambulatory Visit: Payer: Self-pay

## 2019-06-07 ENCOUNTER — Encounter: Payer: Self-pay | Admitting: Internal Medicine

## 2019-06-07 ENCOUNTER — Ambulatory Visit: Payer: Medicare Other | Admitting: Internal Medicine

## 2019-06-07 ENCOUNTER — Other Ambulatory Visit (INDEPENDENT_AMBULATORY_CARE_PROVIDER_SITE_OTHER): Payer: Medicare Other

## 2019-06-07 ENCOUNTER — Ambulatory Visit (INDEPENDENT_AMBULATORY_CARE_PROVIDER_SITE_OTHER): Payer: Medicare Other | Admitting: Internal Medicine

## 2019-06-07 VITALS — BP 118/62 | HR 96 | Temp 98.2°F | Resp 16 | Ht 63.0 in | Wt 135.1 lb

## 2019-06-07 DIAGNOSIS — E038 Other specified hypothyroidism: Secondary | ICD-10-CM

## 2019-06-07 DIAGNOSIS — E782 Mixed hyperlipidemia: Secondary | ICD-10-CM

## 2019-06-07 DIAGNOSIS — I10 Essential (primary) hypertension: Secondary | ICD-10-CM

## 2019-06-07 DIAGNOSIS — I482 Chronic atrial fibrillation, unspecified: Secondary | ICD-10-CM | POA: Diagnosis not present

## 2019-06-07 DIAGNOSIS — D509 Iron deficiency anemia, unspecified: Secondary | ICD-10-CM

## 2019-06-07 DIAGNOSIS — Z23 Encounter for immunization: Secondary | ICD-10-CM | POA: Diagnosis not present

## 2019-06-07 DIAGNOSIS — R609 Edema, unspecified: Secondary | ICD-10-CM

## 2019-06-07 DIAGNOSIS — I340 Nonrheumatic mitral (valve) insufficiency: Secondary | ICD-10-CM

## 2019-06-07 DIAGNOSIS — E538 Deficiency of other specified B group vitamins: Secondary | ICD-10-CM

## 2019-06-07 DIAGNOSIS — R739 Hyperglycemia, unspecified: Secondary | ICD-10-CM

## 2019-06-07 LAB — COMPREHENSIVE METABOLIC PANEL
ALT: 17 U/L (ref 0–35)
AST: 20 U/L (ref 0–37)
Albumin: 4.1 g/dL (ref 3.5–5.2)
Alkaline Phosphatase: 61 U/L (ref 39–117)
BUN: 11 mg/dL (ref 6–23)
CO2: 30 mEq/L (ref 19–32)
Calcium: 10.1 mg/dL (ref 8.4–10.5)
Chloride: 104 mEq/L (ref 96–112)
Creatinine, Ser: 0.62 mg/dL (ref 0.40–1.20)
GFR: 90.05 mL/min (ref >60.00)
Glucose, Bld: 96 mg/dL (ref 70–99)
Potassium: 3.7 mEq/L (ref 3.5–5.1)
Sodium: 142 mEq/L (ref 135–145)
Total Bilirubin: 0.7 mg/dL (ref 0.2–1.2)
Total Protein: 7.4 g/dL (ref 6.0–8.3)

## 2019-06-07 LAB — CBC WITH DIFFERENTIAL/PLATELET
Basophils Absolute: 0 10*3/uL (ref 0.0–0.1)
Basophils Relative: 0.3 % (ref 0.0–3.0)
Eosinophils Absolute: 0 10*3/uL (ref 0.0–0.7)
Eosinophils Relative: 0.7 % (ref 0.0–5.0)
HCT: 36.6 % (ref 36.0–46.0)
Hemoglobin: 12.5 g/dL (ref 12.0–15.0)
Lymphocytes Relative: 23.3 % (ref 12.0–46.0)
Lymphs Abs: 1.1 10*3/uL (ref 0.7–4.0)
MCHC: 34 g/dL (ref 30.0–36.0)
MCV: 100 fl (ref 78.0–100.0)
Monocytes Absolute: 0.4 10*3/uL (ref 0.1–1.0)
Monocytes Relative: 8.2 % (ref 3.0–12.0)
Neutro Abs: 3.3 10*3/uL (ref 1.4–7.7)
Neutrophils Relative %: 67.5 % (ref 43.0–77.0)
Platelets: 136 10*3/uL — ABNORMAL LOW (ref 150.0–400.0)
RBC: 3.67 Mil/uL — ABNORMAL LOW (ref 3.87–5.11)
RDW: 13.7 % (ref 11.5–15.5)
WBC: 4.9 10*3/uL (ref 4.0–10.5)

## 2019-06-07 LAB — LIPID PANEL
Cholesterol: 128 mg/dL (ref 0–200)
HDL: 62.8 mg/dL (ref >39.00)
LDL Cholesterol: 52 mg/dL (ref 0–99)
NonHDL: 65.6
Total CHOL/HDL Ratio: 2
Triglycerides: 66 mg/dL (ref 0.0–149.0)
VLDL: 13.2 mg/dL (ref 0.0–40.0)

## 2019-06-07 LAB — HEMOGLOBIN A1C: Hgb A1c MFr Bld: 5 % (ref 4.6–6.5)

## 2019-06-07 LAB — TSH: TSH: 1.12 u[IU]/mL (ref 0.35–4.50)

## 2019-06-07 MED ORDER — CYANOCOBALAMIN 1000 MCG/ML IJ SOLN
1000.0000 ug | Freq: Once | INTRAMUSCULAR | Status: AC
  Administered 2019-06-07: 10:00:00 1000 ug via INTRAMUSCULAR

## 2019-06-07 NOTE — Assessment & Plan Note (Signed)
B12 deficiency-has been treated with monthly injections, but she has not gotten them since she was here last due to the COVID pandemic We will give an injection today Has been taking oral B complex, which she can continue since she most likely will not come on a monthly basis for injections

## 2019-06-07 NOTE — Assessment & Plan Note (Signed)
Asymptomatic No shortness of breath, chest pain, lightheadedness Following with cardiology

## 2019-06-07 NOTE — Assessment & Plan Note (Signed)
Chronic atrial fibrillation Asymptomatic Following with cardiology Continue diltiazem, Xarelto Will check CBC, CMP, TSH

## 2019-06-07 NOTE — Assessment & Plan Note (Signed)
History of lower GI bleed due to AVMs No symptoms suggestive of active bleeding On Xarelto for A. fib Check CBC, iron panel

## 2019-06-07 NOTE — Patient Instructions (Addendum)
  Tests ordered today. Your results will be released to [place] (or called to you) after review.  If any changes need to be made, you will be notified at that same time.   Flu immunization administered today.  You received a B12 injection today.    Medications reviewed and updated.  Changes include :   none    Please followup in 8 months

## 2019-06-07 NOTE — Assessment & Plan Note (Signed)
Check lipid panel  Continue daily statin Regular exercise and healthy diet encouraged  

## 2019-06-07 NOTE — Assessment & Plan Note (Signed)
Clinically euthyroid Check tsh  Titrate med dose if needed  

## 2019-06-07 NOTE — Assessment & Plan Note (Signed)
Edema controlled with Lasix 20 mg daily Continue current dose CMP

## 2019-06-07 NOTE — Assessment & Plan Note (Signed)
We will check A1c ?

## 2019-06-08 ENCOUNTER — Encounter: Payer: Self-pay | Admitting: Internal Medicine

## 2019-06-08 LAB — IRON,TIBC AND FERRITIN PANEL
%SAT: 34 % (calc) (ref 16–45)
Ferritin: 320 ng/mL — ABNORMAL HIGH (ref 16–288)
Iron: 81 ug/dL (ref 45–160)
TIBC: 238 mcg/dL (calc) — ABNORMAL LOW (ref 250–450)

## 2019-06-12 ENCOUNTER — Other Ambulatory Visit: Payer: Self-pay | Admitting: Internal Medicine

## 2019-07-18 ENCOUNTER — Telehealth: Payer: Self-pay

## 2019-07-18 NOTE — Telephone Encounter (Signed)
Patient scheduled.

## 2019-07-18 NOTE — Telephone Encounter (Signed)
Left Diane, patient's daughter, a message to schedule patient for a virtual visit.

## 2019-07-18 NOTE — Progress Notes (Signed)
Virtual Visit via Video Note  I connected with Grace Alvarado on 07/18/19 at  4:15 PM EDT by a video enabled telemedicine application and verified that I am speaking with the correct person using two identifiers.  Location: Patient: home setting  Provider: office setting    I discussed the limitations of evaluation and management by telemedicine and the availability of in person appointments. The patient expressed understanding and agreed to proceed.  History of Present Illness: Last seen in December 2019 for traumatic hematoma of the right knee. Patient is now having lower back pain. Patient states low back pain.  Is accompanied with daughter who states that unfortunately started having pain after trying to move a couch.  Seem to be on the right side of the low back around the waist.  Patient denies any radiation down the legs, is associated with some constipation.  Does not remember if the constipation started first or not.  Over the last 48 hours has started to improve.  Is able to ambulate some but still has pain on a regular basis.  Denies any bowel incontinence, denies any weakness in the lower extremities.  Denies any fevers or chills recently.   Observations/Objective: Alert and oriented x3, seems comfortable in the full 15 minutes while sitting on the couch while we discussed with her.  Patient was able to get out of the chair without significant difficulty.   Assessment and Plan: Low back pain, patient seems to be pointing more to the sacrum.  Likely more of a injury to the musculature aspect around the sacrum than truly a sacral occult fracture.  We discussed the low but we may need to monitor this closely well.  That if any worsening symptoms x-rays would be necessary.  Patient will try a very low dose of gabapentin to help her with some nighttime relief and the 100 mg and warned of potential side effects.  Discussed icing regimen discussed over-the-counter medications that could also be  beneficial including a topical anti-inflammatory but avoid oral anti-inflammatory secondary to the blood thinner.  Follow-up with me at least virtually in 1 week but if worsening pain we will need to see her sooner in the office.   Follow Up Instructions:    I discussed the assessment and treatment plan with the patient. The patient was provided an opportunity to ask questions and all were answered. The patient agreed with the plan and demonstrated an understanding of the instructions.   The patient was advised to call back or seek an in-person evaluation if the symptoms worsen or if the condition fails to improve as anticipated.  I provided 26 minutes of non-face-to-face time during this encounter.   Lyndal Pulley, DO

## 2019-07-20 ENCOUNTER — Encounter: Payer: Self-pay | Admitting: Family Medicine

## 2019-07-20 ENCOUNTER — Ambulatory Visit (INDEPENDENT_AMBULATORY_CARE_PROVIDER_SITE_OTHER): Payer: Medicare Other | Admitting: Family Medicine

## 2019-07-20 DIAGNOSIS — G8929 Other chronic pain: Secondary | ICD-10-CM

## 2019-07-20 DIAGNOSIS — M545 Low back pain, unspecified: Secondary | ICD-10-CM | POA: Insufficient documentation

## 2019-07-20 MED ORDER — GABAPENTIN 100 MG PO CAPS: 100.0000 mg | ORAL_CAPSULE | Freq: Every day | ORAL | 3 refills | Status: DC

## 2019-07-25 ENCOUNTER — Encounter: Payer: Self-pay | Admitting: Family Medicine

## 2019-07-26 ENCOUNTER — Encounter: Payer: Self-pay | Admitting: Family Medicine

## 2019-07-27 ENCOUNTER — Ambulatory Visit: Payer: Medicare Other | Admitting: Family Medicine

## 2019-07-27 ENCOUNTER — Ambulatory Visit (INDEPENDENT_AMBULATORY_CARE_PROVIDER_SITE_OTHER): Payer: Medicare Other | Admitting: Family Medicine

## 2019-07-27 ENCOUNTER — Encounter: Payer: Self-pay | Admitting: Family Medicine

## 2019-07-27 ENCOUNTER — Other Ambulatory Visit: Payer: Self-pay

## 2019-07-27 ENCOUNTER — Other Ambulatory Visit: Payer: Self-pay | Admitting: Family Medicine

## 2019-07-27 ENCOUNTER — Ambulatory Visit (INDEPENDENT_AMBULATORY_CARE_PROVIDER_SITE_OTHER)
Admission: RE | Admit: 2019-07-27 | Discharge: 2019-07-27 | Disposition: A | Discharge status: Home / Self Care | Payer: Medicare Other | Source: Ambulatory Visit | Attending: Family Medicine | Admitting: Family Medicine

## 2019-07-27 DIAGNOSIS — M545 Low back pain, unspecified: Secondary | ICD-10-CM

## 2019-07-27 DIAGNOSIS — S32050A Wedge compression fracture of fifth lumbar vertebra, initial encounter for closed fracture: Secondary | ICD-10-CM

## 2019-07-27 DIAGNOSIS — S32010A Wedge compression fracture of first lumbar vertebra, initial encounter for closed fracture: Secondary | ICD-10-CM | POA: Insufficient documentation

## 2019-07-27 MED ORDER — TRAMADOL HCL 50 MG PO TABS: 25.0000 mg | ORAL_TABLET | Freq: Every evening | ORAL | 0 refills | Status: DC | PRN

## 2019-07-27 MED ORDER — VITAMIN D (ERGOCALCIFEROL) 1.25 MG (50000 UNIT) PO CAPS: 50000.0000 [IU] | ORAL_CAPSULE | ORAL | 0 refills | Status: DC

## 2019-07-27 MED ORDER — CALCITONIN (SALMON) 200 UNIT/ACT NA SOLN: 1.0000 | Freq: Every day | NASAL | 0 refills | Status: DC

## 2019-07-27 NOTE — Telephone Encounter (Signed)
Spoke with patient who is coming in at 12:30pm for xray and OV.

## 2019-07-27 NOTE — Progress Notes (Signed)
[facility] [place], [place] [address] Phone: [phone] Subjective:   Grace Alvarado, am serving as a scribe for Dr. Hulan Saas.  I'm seeing this patient by the request  of:    CC: Low back pain  [id number]  Grace Alvarado is a 85 y.o. female coming in with complaint of worsening back pain since last visit on 07/20/2019. Patient states that she is having intermittent back pain in lumbar spine more on the right than the left. Has been using Salonpas back patches. Has not been taking gabapentin.  Patient states that nothing seems to be helping at the moment.  Patient since then the pain has been severe enough.  Affecting daily activities.  Has difficulty with daily activities.  Denies any bowel or bladder incontinence.  States that some mild tingling in the legs.      Past Medical History:  Diagnosis Date  . Atrial fibrillation ([place])   . Hyperlipidemia   . Hypertension   . Hypothyroidism   . Mitral regurgitation   . MVP (mitral valve prolapse)    Past Surgical History:  Procedure Laterality Date  . APPENDECTOMY    . c section    . CESAREAN SECTION    . CHOLECYSTECTOMY    . COLONOSCOPY WITH PROPOFOL N/A 11/09/2018   Procedure: COLONOSCOPY WITH PROPOFOL;  Surgeon: Doran Stabler, MD;  Location: [facility];  Service: Gastroenterology;  Laterality: N/A;  . ESOPHAGOGASTRODUODENOSCOPY (EGD) WITH PROPOFOL N/A 11/09/2018   Procedure: ESOPHAGOGASTRODUODENOSCOPY (EGD) WITH PROPOFOL;  Surgeon: Doran Stabler, MD;  Location: [facility];  Service: Gastroenterology;  Laterality: N/A;  . HOT HEMOSTASIS N/A 11/09/2018   Procedure: HOT HEMOSTASIS (ARGON PLASMA COAGULATION/BICAP);  Surgeon: Doran Stabler, MD;  Location: [facility];  Service: Gastroenterology;  Laterality: N/A;  . SUBMUCOSAL INJECTION  11/09/2018   Procedure: SUBMUCOSAL INJECTION;  Surgeon: Doran Stabler, MD;  Location: [facility] ENDOSCOPY;  Service: Gastroenterology;;    Social History   Socioeconomic History  . Marital status: Widowed    Spouse name: Not on file  . Number of children: 3  . Years of education: Not on file  . Highest education level: Not on file  Occupational History  . Occupation: retired  Scientific laboratory technician  . Financial resource strain: Not on file  . Food insecurity    Worry: Not on file    Inability: Not on file  . Transportation needs    Medical: Not on file    Non-medical: Not on file  Tobacco Use  . Smoking status: Former Research scientist (life sciences)  . Smokeless tobacco: Never Used  Substance and Sexual Activity  . Alcohol use: Not Currently  . Drug use: Never  . Sexual activity: Not Currently  Lifestyle  . Physical activity    Days per week: Not on file    Minutes per session: Not on file  . Stress: Not on file  Relationships  . Social Herbalist on phone: Not on file    Gets together: Not on file    Attends religious service: Not on file    Active member of club or organization: Not on file    Attends meetings of clubs or organizations: Not on file    Relationship status: Not on file  Other Topics Concern  . Not on file  Social History Narrative  . Not on file   Allergies  Allergen Reactions  . Codeine Other (See Comments)  unknown  . Penicillins Other (See Comments)    unknown  . Vancomycin Other (See Comments)    unknown  . Zithromax [Azithromycin] Other (See Comments)    unknown  . Latex Rash   Family History  Problem Relation Age of Onset  . Cancer Mother   . Dementia Mother     Current Outpatient Medications (Endocrine & Metabolic):  .  calcitonin, salmon, (MIACALCIN) 200 UNIT/ACT nasal spray, Place 1 spray into alternate nostrils daily. Marland Kitchen  levothyroxine (SYNTHROID, LEVOTHROID) 75 MCG tablet, Take 1 tablet (75 mcg total) by mouth daily before breakfast.  Current Outpatient Medications (Cardiovascular):  .  diltiazem (CARDIZEM SR) 120 MG 12 hr capsule, TAKE 1 CAPSULE (120 MG TOTAL) BY MOUTH 2 (TWO)  TIMES DAILY. .  furosemide (LASIX) 20 MG tablet, Take 1 tablet (20 mg total) by mouth daily. .  pravastatin (PRAVACHOL) 10 MG tablet, Take 1 tablet (10 mg total) by mouth daily.  Current Outpatient Medications (Respiratory):  .  fluticasone (FLONASE) 50 MCG/ACT nasal spray, SPRAY 2 SPRAYS INTO EACH NOSTRIL EVERY DAY  Current Outpatient Medications (Analgesics):  .  traMADol (ULTRAM) 50 MG tablet, Take 0.5 tablets (25 mg total) by mouth at bedtime as needed.  Current Outpatient Medications (Hematological):  .  ferrous gluconate (FERGON) 324 MG tablet, Take 2 tablets (648 mg total) by mouth every Monday, Wednesday, and Friday. .  rivaroxaban (XARELTO) 20 MG TABS tablet, Take 1 tablet (20 mg total) by mouth daily with supper. Hold xarelto until 2/11  Current Outpatient Medications (Other):  .  cholecalciferol (VITAMIN D3) 25 MCG (1000 UT) tablet, Take 1,000 Units by mouth daily. Marland Kitchen  gabapentin (NEURONTIN) 100 MG capsule, Take 1 capsule (100 mg total) by mouth at bedtime. .  Multiple Vitamin (VITAMIN E/FOLIC ACID/B-6/B-12 PO), Take 1 tablet by mouth daily. .  potassium chloride (K-DUR) 10 MEQ tablet, TAKE 1 TABLET (10 MEQ) TWICE DAILY. .  Probiotic Product (PRO-BIOTIC BLEND PO), Take 1 capsule by mouth daily. .  vitamin C (ASCORBIC ACID) 500 MG tablet, Take 500 mg by mouth daily. .  Vitamin D, Ergocalciferol, (DRISDOL) 1.25 MG (50000 UT) CAPS capsule, Take 1 capsule (50,000 Units total) by mouth every 7 (seven) days.    Past medical history, social, surgical and family history all reviewed in electronic medical record.  Alvarado pertanent information unless stated regarding to the chief complaint.   Review of Systems:  Alvarado headache, visual changes, nausea, vomiting, diarrhea, constipation, dizziness, abdominal pain, skin rash, fevers, chills, night sweats, weight loss, swollen lymph nodes, body aches, joint swelling, muscle aches, chest pain, shortness of breath, mood changes.   Objective  Blood  pressure 122/78, height 5\' 3"  (1.6 m), weight 136 lb (61.7 kg).    General: Alvarado apparent distress alert and oriented x3 mood and affect normal, dressed appropriately.  HEENT: Pupils equal, extraocular movements intact  Respiratory: Patient's speak in full sentences and does not appear short of breath  Cardiovascular: Trace lower extremity edema, non tender, Alvarado erythema  Skin: Warm dry intact with Alvarado signs of infection or rash on extremities or on axial skeleton.  Abdomen: Soft nontender  Neuro: Cranial nerves II through XII are intact, neurovascularly intact in all extremities with 2+ DTRs and 2+ pulses.  Lymph: Alvarado lymphadenopathy of posterior or anterior cervical chain or axillae bilaterally.  Gait antalgic gait MSK: Severe arthritic changes multiple joints.  Patient denies any tenderness functional degenerative scoliosis noted. Severe tenderness to palpation in the L1, L2 area.  Patient mostly  in the spinous process.  Mild of the paraspinal musculature of the lumbar spine as well.     Impression and Recommendations:     This case required medical decision making of moderate complexity. The above documentation has been reviewed and is accurate and complete Judi SaaZachary M , DO       Note: This dictation was prepared with Dragon dictation along with smaller phrase technology. Any transcriptional errors that result from this process are unintentional.

## 2019-07-27 NOTE — Patient Instructions (Addendum)
Good to see you.  Ice 20 minutes 2 times daily. Usually after activity and before bed. Once weekly vitamin D for 12 weeks. Compression fracture at L1 Calcitonin nose spray- if feel funny or nose bleed, discontinue immediately Tramadol 1/2 pill at night if severe pain but please try to avoid as well  See me again in 2 weeks virtually

## 2019-07-27 NOTE — Progress Notes (Deleted)
Virtual Visit via Video Note  I connected with Grace Alvarado on 07/27/19 at  4:15 PM EDT by a video enabled telemedicine application and verified that I am speaking with the correct person using two identifiers.  Location: Patient: *** Provider: ***   I discussed the limitations of evaluation and management by telemedicine and the availability of in person appointments. The patient expressed understanding and agreed to proceed.  History of Present Illness:    Observations/Objective:   Assessment and Plan:   Follow Up Instructions:    I discussed the assessment and treatment plan with the patient. The patient was provided an opportunity to ask questions and all were answered. The patient agreed with the plan and demonstrated an understanding of the instructions.   The patient was advised to call back or seek an in-person evaluation if the symptoms worsen or if the condition fails to improve as anticipated.  I provided *** minutes of non-face-to-face time during this encounter.   Lyndal Pulley, DO

## 2019-07-27 NOTE — Assessment & Plan Note (Signed)
Compression fracture of actually L1.  And difficulty assess otherwise.  Seems to be more of an acute on chronic.  Patient would not be willing to consider kyphoplasty so do not feel that a CT scan is necessary at the moment.  Over read is still pending.  Discussed icing regimen and home exercises.  Patient given calcitonin and warned of potential side effects after visit though I did call patient and discussed with daughter not to pick up the medication secondary to her being on a calcium channel blocker as well as Lasix.  We will try just the vitamin D if necessary the low dose of the pain medication.

## 2019-08-10 ENCOUNTER — Ambulatory Visit (INDEPENDENT_AMBULATORY_CARE_PROVIDER_SITE_OTHER): Payer: Medicare Other | Admitting: Family Medicine

## 2019-08-10 ENCOUNTER — Encounter: Payer: Self-pay | Admitting: Family Medicine

## 2019-08-10 DIAGNOSIS — S32050D Wedge compression fracture of fifth lumbar vertebra, subsequent encounter for fracture with routine healing: Secondary | ICD-10-CM

## 2019-08-10 NOTE — Progress Notes (Signed)
Virtual Visit via Telephone Note  I connected with Blondine Hottel on 08/10/19 at  4:30 PM EST by telephone and verified that I am speaking with the correct person using two identifiers.  Location: Patient: at home   Provider: in office    I discussed the limitations, risks, security and privacy concerns of performing an evaluation and management service by telephone and the availability of in person appointments. I also discussed with the patient that there may be a patient responsible charge related to this service. The patient expressed understanding and agreed to proceed.   History of Present Illness: 85 yo female following up for compression  Fracture in back. Overall making progress, sever pain is bette,r no longer intense and now more diffuse. No radiation down the legs. Only taking tylenol and vitamin D     Observations/Objective: Alert and oriented x 3 seemed comfortable in voice. Was with Daughters on call as well   Assessment and Plan: Compression fracture, doing well Discussed which activities tylenol Vit D and calcium  No bracing     Follow Up Instructions: telephone 2 weeks     I discussed the assessment and treatment plan with the patient. The patient was provided an opportunity to ask questions and all were answered. The patient agreed with the plan and demonstrated an understanding of the instructions.   The patient was advised to call back or seek an in-person evaluation if the symptoms worsen or if the condition fails to improve as anticipated.  I provided 16 minutes of non-face-to-face time during this encounter.   Lyndal Pulley, DO

## 2019-08-12 ENCOUNTER — Other Ambulatory Visit: Payer: Self-pay | Admitting: Internal Medicine

## 2019-08-15 ENCOUNTER — Encounter: Payer: Self-pay | Admitting: Family Medicine

## 2019-08-22 ENCOUNTER — Encounter: Payer: Self-pay | Admitting: Family Medicine

## 2019-08-22 NOTE — Progress Notes (Deleted)
HPI: FU atrial fibrillation. Patient previously resided in [place]. Patient with history of permanent atrial fibrillation. Previously failed amiodarone and multaq per records. Nuclear study 2012 showed no ischemia with normal LV function. Last echo 2/19 showed ejection fraction 54% with mild global hypokinesis. Severe biatrial enlargement. Trace aortic insufficiency, mitral valve prolapse, severe mitral regurgitation and moderate tricuspid regurgitation. Patient apparently made it clear previously that she did not want valve surgery. Lower extremity venous Dopplers January 2020 showed no DVT.    Echocardiogram January 2020 showed normal LV systolic function, moderate biatrial enlargement, moderate pulmonary hypertension, moderate to severe mitral regurgitation.  Had GI bleed February 2020. Colonoscopy February 2020 showed colonic angiectasia which was injected and diverticulosis.  Since last seen   Current Outpatient Medications  Medication Sig Dispense Refill  . calcitonin, salmon, (MIACALCIN) 200 UNIT/ACT nasal spray Place 1 spray into alternate nostrils daily. 3.7 mL 0  . cholecalciferol (VITAMIN D3) 25 MCG (1000 UT) tablet Take 1,000 Units by mouth daily.    Marland Kitchen diltiazem (CARDIZEM SR) 120 MG 12 hr capsule TAKE 1 CAPSULE (120 MG TOTAL) BY MOUTH 2 (TWO) TIMES DAILY. 180 capsule 1  . ferrous gluconate (FERGON) 324 MG tablet Take 2 tablets (648 mg total) by mouth every Monday, Wednesday, and Friday. 90 tablet 0  . fluticasone (FLONASE) 50 MCG/ACT nasal spray SPRAY 2 SPRAYS INTO EACH NOSTRIL EVERY DAY 48 mL 2  . furosemide (LASIX) 20 MG tablet Take 1 tablet (20 mg total) by mouth daily. 90 tablet 3  . gabapentin (NEURONTIN) 100 MG capsule Take 1 capsule (100 mg total) by mouth at bedtime. 30 capsule 3  . levothyroxine (SYNTHROID, LEVOTHROID) 75 MCG tablet Take 1 tablet (75 mcg total) by mouth daily before breakfast. 90 tablet 3  . Multiple Vitamin (VITAMIN E/FOLIC MGQQ/P-6/P-95 PO) Take 1  tablet by mouth daily.    . potassium chloride (KLOR-CON) 10 MEQ tablet TAKE 1 TABLET (10 MEQ) TWICE DAILY. 180 tablet 0  . pravastatin (PRAVACHOL) 10 MG tablet Take 1 tablet (10 mg total) by mouth daily. 90 tablet 3  . Probiotic Product (PRO-BIOTIC BLEND PO) Take 1 capsule by mouth daily.    . rivaroxaban (XARELTO) 20 MG TABS tablet Take 1 tablet (20 mg total) by mouth daily with supper. Hold xarelto until 2/11 90 tablet 0  . traMADol (ULTRAM) 50 MG tablet Take 0.5 tablets (25 mg total) by mouth at bedtime as needed. 10 tablet 0  . vitamin C (ASCORBIC ACID) 500 MG tablet Take 500 mg by mouth daily.    . Vitamin D, Ergocalciferol, (DRISDOL) 1.25 MG (50000 UT) CAPS capsule Take 1 capsule (50,000 Units total) by mouth every 7 (seven) days. 12 capsule 0   No current facility-administered medications for this visit.      Past Medical History:  Diagnosis Date  . Atrial fibrillation ([place])   . Hyperlipidemia   . Hypertension   . Hypothyroidism   . Mitral regurgitation   . MVP (mitral valve prolapse)     Past Surgical History:  Procedure Laterality Date  . APPENDECTOMY    . c section    . CESAREAN SECTION    . CHOLECYSTECTOMY    . COLONOSCOPY WITH PROPOFOL N/A 11/09/2018   Procedure: COLONOSCOPY WITH PROPOFOL;  Surgeon: Doran Stabler, MD;  Location: [facility];  Service: Gastroenterology;  Laterality: N/A;  . ESOPHAGOGASTRODUODENOSCOPY (EGD) WITH PROPOFOL N/A 11/09/2018   Procedure: ESOPHAGOGASTRODUODENOSCOPY (EGD) WITH PROPOFOL;  Surgeon: Doran Stabler, MD;  Location: [facility] ENDOSCOPY;  Service: Gastroenterology;  Laterality: N/A;  . HOT HEMOSTASIS N/A 11/09/2018   Procedure: HOT HEMOSTASIS (ARGON PLASMA COAGULATION/BICAP);  Surgeon: Sherrilyn Rist, MD;  Location: [facility] ENDOSCOPY;  Service: Gastroenterology;  Laterality: N/A;  . SUBMUCOSAL INJECTION  11/09/2018   Procedure: SUBMUCOSAL INJECTION;  Surgeon: Sherrilyn Rist, MD;  Location: [facility] ENDOSCOPY;  Service: Gastroenterology;;     Social History   Socioeconomic History  . Marital status: Widowed    Spouse name: Not on file  . Number of children: 3  . Years of education: Not on file  . Highest education level: Not on file  Occupational History  . Occupation: retired  Engineer, production  . Financial resource strain: Not on file  . Food insecurity    Worry: Not on file    Inability: Not on file  . Transportation needs    Medical: Not on file    Non-medical: Not on file  Tobacco Use  . Smoking status: Former Games developer  . Smokeless tobacco: Never Used  Substance and Sexual Activity  . Alcohol use: Not Currently  . Drug use: Never  . Sexual activity: Not Currently  Lifestyle  . Physical activity    Days per week: Not on file    Minutes per session: Not on file  . Stress: Not on file  Relationships  . Social Musician on phone: Not on file    Gets together: Not on file    Attends religious service: Not on file    Active member of club or organization: Not on file    Attends meetings of clubs or organizations: Not on file    Relationship status: Not on file  . Intimate partner violence    Fear of current or ex partner: Not on file    Emotionally abused: Not on file    Physically abused: Not on file    Forced sexual activity: Not on file  Other Topics Concern  . Not on file  Social History Narrative  . Not on file    Family History  Problem Relation Age of Onset  . Cancer Mother   . Dementia Mother     ROS: no fevers or chills, productive cough, hemoptysis, dysphasia, odynophagia, melena, hematochezia, dysuria, hematuria, rash, seizure activity, orthopnea, PND, pedal edema, claudication. Remaining systems are negative.  Physical Exam: Well-developed well-nourished in no acute distress.  Skin is warm and dry.  HEENT is normal.  Neck is supple.  Chest is clear to auscultation with normal expansion.  Cardiovascular exam is regular rate and rhythm.  Abdominal exam nontender or distended.  No masses palpated. Extremities show no edema. neuro grossly intact  ECG- personally reviewed  A/P  1 permanent atrial fibrillation-continue Cardizem for rate control.  Continue Xarelto.  Check hemoglobin and renal function.  2 mitral valve prolapse-moderate to severe mitral regurgitation on most recent echocardiogram.  Given age we are treating conservatively.  3 hypertension-blood pressure controlled.  Continue present medications and follow.  4 hyperlipidemia-continue statin.  5 lower extremity edema-reasonably well controlled.  Continue present dose of Lasix, compression hose and feet elevation.  Check potassium and renal function.  Olga Millers, MD

## 2019-08-24 ENCOUNTER — Encounter: Payer: Self-pay | Admitting: Family Medicine

## 2019-08-24 ENCOUNTER — Other Ambulatory Visit: Payer: Self-pay

## 2019-08-24 ENCOUNTER — Ambulatory Visit (INDEPENDENT_AMBULATORY_CARE_PROVIDER_SITE_OTHER): Payer: Medicare Other | Admitting: Family Medicine

## 2019-08-24 DIAGNOSIS — S32050D Wedge compression fracture of fifth lumbar vertebra, subsequent encounter for fracture with routine healing: Secondary | ICD-10-CM

## 2019-08-24 NOTE — Progress Notes (Signed)
Virtual Visit via Video Note  I connected with Grace Alvarado on 08/24/19 at  4:30 PM EST by a video enabled telemedicine application and verified that I am speaking with the correct person using two identifiers.  Location: Patient: Patient is in home setting accompanied with daughter Provider: In office setting   I discussed the limitations of evaluation and management by telemedicine and the availability of in person appointments. The patient expressed understanding and agreed to proceed.  History of Present Illness: 85 year old female who had a new onset T11 compression fracture and an old L1 compression fracture.  Had been doing well and then had 4 days of increased pain that did not allow patient to even get up to go the restroom.  Now doing significantly better again.  Feels like he is back at her baseline.    Observations/Objective: Alert, seems comfortable sitting in discussing her pain.   Assessment and Plan: T11 compression fracture.  Seems to be stable overall.  Would want another x-ray but with patient and the coronavirus and patient being high risk we would avoid it at the moment.  Patient knows if any radicular symptoms occur for worsening pain we will consider other medications or further evaluation in an office if necessary.  Patient is in agreement with this.  .   Follow Up Instructions:Follow-up again 4 weeks phone call or virtual    I discussed the assessment and treatment plan with the patient. The patient was provided an opportunity to ask questions and all were answered. The patient agreed with the plan and demonstrated an understanding of the instructions.   The patient was advised to call back or seek an in-person evaluation if the symptoms worsen or if the condition fails to improve as anticipated.  I provided 15 minutes of face-to-face time during this encounter.   Lyndal Pulley, DO

## 2019-08-29 ENCOUNTER — Encounter: Payer: Self-pay | Admitting: Family Medicine

## 2019-09-05 ENCOUNTER — Ambulatory Visit: Payer: Medicare Other | Admitting: Cardiology

## 2019-09-21 ENCOUNTER — Other Ambulatory Visit: Payer: Self-pay | Admitting: Internal Medicine

## 2019-10-11 ENCOUNTER — Encounter: Payer: Self-pay | Admitting: Internal Medicine

## 2019-10-11 ENCOUNTER — Other Ambulatory Visit: Payer: Self-pay | Admitting: Family Medicine

## 2019-10-12 ENCOUNTER — Encounter: Payer: Self-pay | Admitting: Internal Medicine

## 2019-10-13 ENCOUNTER — Encounter: Payer: Self-pay | Admitting: Family Medicine

## 2019-10-14 ENCOUNTER — Encounter: Payer: Self-pay | Admitting: Internal Medicine

## 2019-10-25 ENCOUNTER — Ambulatory Visit: Payer: Medicare Other | Attending: Internal Medicine

## 2019-10-25 DIAGNOSIS — Z23 Encounter for immunization: Secondary | ICD-10-CM | POA: Insufficient documentation

## 2019-10-25 NOTE — Progress Notes (Signed)
   Covid-19 Vaccination Clinic  Name:  Emanuella Nickle    MRN: [id number] DOB: [birth year]  10/25/2019  Ms. Gasner was observed post Covid-19 immunization for 15 minutes without incidence. She was provided with Vaccine Information Sheet and instruction to access the V-Safe system.   Ms. Vanhook was instructed to call 911 with any severe reactions post vaccine: Marland Kitchen Difficulty breathing  . Swelling of your face and throat  . A fast heartbeat  . A bad rash all over your body  . Dizziness and weakness    Immunizations Administered    Name Date Dose VIS Date Route   Pfizer COVID-19 Vaccine 10/25/2019 11:28 AM 0.3 mL 09/16/2019 Intramuscular   Manufacturer: [employer], [employer]   Lot: [id number]   NDC: [address]-4

## 2019-10-31 ENCOUNTER — Encounter: Payer: Self-pay | Admitting: Internal Medicine

## 2019-11-03 ENCOUNTER — Ambulatory Visit: Payer: Medicare Other | Admitting: Cardiology

## 2019-11-14 ENCOUNTER — Ambulatory Visit: Payer: Medicare Other | Attending: Internal Medicine

## 2019-11-14 DIAGNOSIS — Z23 Encounter for immunization: Secondary | ICD-10-CM | POA: Insufficient documentation

## 2019-11-14 NOTE — Progress Notes (Signed)
   Covid-19 Vaccination Clinic  Name:  Grace Alvarado    MRN: [id number] DOB: 03-20-1925  11/14/2019  [name] was observed post Covid-19 immunization for 15 minutes without incidence. She was provided with Vaccine Information Sheet and instruction to access the V-Safe system.   [name] was instructed to call 911 with any severe reactions post vaccine: Marland Kitchen Difficulty breathing  . Swelling of your face and throat  . A fast heartbeat  . A bad rash all over your body  . Dizziness and weakness    Immunizations Administered    Name Date Dose VIS Date Route   Pfizer COVID-19 Vaccine 11/14/2019  9:46 AM 0.3 mL 09/16/2019 Intramuscular   Manufacturer: [employer], [employer]   Lot: [id number]   NDC: [address]-0

## 2019-11-16 ENCOUNTER — Encounter: Payer: Self-pay | Admitting: Internal Medicine

## 2019-11-23 NOTE — Progress Notes (Deleted)
HPI: FU atrial fibrillation. Patient previously resided in [place].Patient with history of permanent atrial fibrillation. Previously failed amiodarone and multaq per records. Nuclear study 2012 showed no ischemia with normal LV function. Patient apparently made it clear previously that she did not want valve surgery. Lower extremity venous Dopplers January 2020 showed no DVT. Echocardiogram January 2020 showed normal LV function, moderate biatrial enlargement, moderate to severe mitral regurgitation with posterior mitral valve prolapse Since last seen   Current Outpatient Medications  Medication Sig Dispense Refill  . calcitonin, salmon, (MIACALCIN) 200 UNIT/ACT nasal spray Place 1 spray into alternate nostrils daily. 3.7 mL 0  . cholecalciferol (VITAMIN D3) 25 MCG (1000 UT) tablet Take 1,000 Units by mouth daily.    Marland Kitchen diltiazem (CARDIZEM SR) 120 MG 12 hr capsule TAKE 1 CAPSULE (120 MG TOTAL) BY MOUTH 2 (TWO) TIMES DAILY. 180 capsule 1  . ferrous gluconate (FERGON) 324 MG tablet Take 2 tablets (648 mg total) by mouth every Monday, Wednesday, and Friday. 90 tablet 0  . fluticasone (FLONASE) 50 MCG/ACT nasal spray SPRAY 2 SPRAYS INTO EACH NOSTRIL EVERY DAY 48 mL 2  . furosemide (LASIX) 20 MG tablet Take 1 tablet (20 mg total) by mouth daily. 90 tablet 3  . gabapentin (NEURONTIN) 100 MG capsule Take 1 capsule (100 mg total) by mouth at bedtime. 30 capsule 3  . levothyroxine (SYNTHROID, LEVOTHROID) 75 MCG tablet Take 1 tablet (75 mcg total) by mouth daily before breakfast. 90 tablet 3  . Multiple Vitamin (VITAMIN E/FOLIC ACID/B-6/B-12 PO) Take 1 tablet by mouth daily.    . potassium chloride (KLOR-CON) 10 MEQ tablet TAKE 1 TABLET (10 MEQ) TWICE DAILY. 180 tablet 0  . pravastatin (PRAVACHOL) 10 MG tablet Take 1 tablet (10 mg total) by mouth daily. 90 tablet 3  . Probiotic Product (PRO-BIOTIC BLEND PO) Take 1 capsule by mouth daily.    . traMADol (ULTRAM) 50 MG tablet Take 0.5 tablets (25 mg  total) by mouth at bedtime as needed. 10 tablet 0  . vitamin C (ASCORBIC ACID) 500 MG tablet Take 500 mg by mouth daily.    . Vitamin D, Ergocalciferol, (DRISDOL) 1.25 MG (50000 UT) CAPS capsule TAKE 1 CAPSULE (50,000 UNITS TOTAL) BY MOUTH EVERY 7 (SEVEN) DAYS. 12 capsule 0  . XARELTO 20 MG TABS tablet TAKE 1 TABLET (20 MG TOTAL) BY MOUTH DAILY WITH SUPPER 90 tablet 1   No current facility-administered medications for this visit.     Past Medical History:  Diagnosis Date  . Atrial fibrillation (HCC)   . Hyperlipidemia   . Hypertension   . Hypothyroidism   . Mitral regurgitation   . MVP (mitral valve prolapse)     Past Surgical History:  Procedure Laterality Date  . APPENDECTOMY    . c section    . CESAREAN SECTION    . CHOLECYSTECTOMY    . COLONOSCOPY WITH PROPOFOL N/A 11/09/2018   Procedure: COLONOSCOPY WITH PROPOFOL;  Surgeon: Sherrilyn Rist, MD;  Location: [facility] ENDOSCOPY;  Service: Gastroenterology;  Laterality: N/A;  . ESOPHAGOGASTRODUODENOSCOPY (EGD) WITH PROPOFOL N/A 11/09/2018   Procedure: ESOPHAGOGASTRODUODENOSCOPY (EGD) WITH PROPOFOL;  Surgeon: Sherrilyn Rist, MD;  Location: [facility] ENDOSCOPY;  Service: Gastroenterology;  Laterality: N/A;  . HOT HEMOSTASIS N/A 11/09/2018   Procedure: HOT HEMOSTASIS (ARGON PLASMA COAGULATION/BICAP);  Surgeon: Sherrilyn Rist, MD;  Location: [facility] ENDOSCOPY;  Service: Gastroenterology;  Laterality: N/A;  . SUBMUCOSAL INJECTION  11/09/2018   Procedure: SUBMUCOSAL INJECTION;  Surgeon:  Doran Stabler, MD;  Location: [facility] ENDOSCOPY;  Service: Gastroenterology;;    Social History   Socioeconomic History  . Marital status: Widowed    Spouse name: Not on file  . Number of children: 3  . Years of education: Not on file  . Highest education level: Not on file  Occupational History  . Occupation: retired  Tobacco Use  . Smoking status: Former Research scientist (life sciences)  . Smokeless tobacco: Never Used  Substance and Sexual Activity  . Alcohol use: Not Currently    . Drug use: Never  . Sexual activity: Not Currently  Other Topics Concern  . Not on file  Social History Narrative  . Not on file   Social Determinants of Health   Financial Resource Strain:   . Difficulty of Paying Living Expenses: Not on file  Food Insecurity:   . Worried About Charity fundraiser in the Last Year: Not on file  . Ran Out of Food in the Last Year: Not on file  Transportation Needs:   . Lack of Transportation (Medical): Not on file  . Lack of Transportation (Non-Medical): Not on file  Physical Activity:   . Days of Exercise per Week: Not on file  . Minutes of Exercise per Session: Not on file  Stress:   . Feeling of Stress : Not on file  Social Connections:   . Frequency of Communication with Friends and Family: Not on file  . Frequency of Social Gatherings with Friends and Family: Not on file  . Attends Religious Services: Not on file  . Active Member of Clubs or Organizations: Not on file  . Attends Archivist Meetings: Not on file  . Marital Status: Not on file  Intimate Partner Violence:   . Fear of Current or Ex-Partner: Not on file  . Emotionally Abused: Not on file  . Physically Abused: Not on file  . Sexually Abused: Not on file    Family History  Problem Relation Age of Onset  . Cancer Mother   . Dementia Mother     ROS: no fevers or chills, productive cough, hemoptysis, dysphasia, odynophagia, melena, hematochezia, dysuria, hematuria, rash, seizure activity, orthopnea, PND, pedal edema, claudication. Remaining systems are negative.  Physical Exam: Well-developed well-nourished in no acute distress.  Skin is warm and dry.  HEENT is normal.  Neck is supple.  Chest is clear to auscultation with normal expansion.  Cardiovascular exam is regular rate and rhythm.  Abdominal exam nontender or distended. No masses palpated. Extremities show no edema. neuro grossly intact  ECG- personally reviewed  A/P  1 permanent atrial  fibrillation-continue Cardizem at present dose for rate control.  Continue Xarelto.  2 history of mitral valve prolapse-last echocardiogram showed moderate to severe mitral regurgitation.  We will repeat echocardiogram.  Given age we are being conservative.  3 hypertension-blood pressure controlled.  Continue present medical regimen.  4 hyperlipidemia-continue statin.  5 lower extremity edema-continue Lasix at present dose.  Check potassium and renal function.  Felt likely secondary to venous insufficiency.  Kirk Ruths, MD

## 2019-11-29 ENCOUNTER — Ambulatory Visit: Payer: Medicare Other | Admitting: Cardiology

## 2019-11-29 ENCOUNTER — Encounter: Payer: Self-pay | Admitting: Internal Medicine

## 2019-11-30 ENCOUNTER — Encounter: Payer: Self-pay | Admitting: Internal Medicine

## 2019-12-05 ENCOUNTER — Ambulatory Visit: Payer: Medicare Other | Admitting: Podiatry

## 2019-12-05 ENCOUNTER — Other Ambulatory Visit: Payer: Self-pay

## 2019-12-05 DIAGNOSIS — Z7901 Long term (current) use of anticoagulants: Secondary | ICD-10-CM | POA: Diagnosis not present

## 2019-12-05 DIAGNOSIS — M79674 Pain in right toe(s): Secondary | ICD-10-CM

## 2019-12-05 DIAGNOSIS — M79675 Pain in left toe(s): Secondary | ICD-10-CM | POA: Diagnosis not present

## 2019-12-05 DIAGNOSIS — B351 Tinea unguium: Secondary | ICD-10-CM | POA: Diagnosis not present

## 2019-12-05 DIAGNOSIS — L84 Corns and callosities: Secondary | ICD-10-CM | POA: Diagnosis not present

## 2019-12-06 ENCOUNTER — Encounter: Payer: Self-pay | Admitting: Internal Medicine

## 2019-12-06 ENCOUNTER — Ambulatory Visit: Payer: Medicare Other | Admitting: Internal Medicine

## 2019-12-06 VITALS — BP 144/72 | HR 99 | Temp 98.2°F | Resp 16 | Ht 63.0 in | Wt 135.0 lb

## 2019-12-06 DIAGNOSIS — E538 Deficiency of other specified B group vitamins: Secondary | ICD-10-CM | POA: Diagnosis not present

## 2019-12-06 DIAGNOSIS — R739 Hyperglycemia, unspecified: Secondary | ICD-10-CM

## 2019-12-06 DIAGNOSIS — R609 Edema, unspecified: Secondary | ICD-10-CM

## 2019-12-06 DIAGNOSIS — I10 Essential (primary) hypertension: Secondary | ICD-10-CM

## 2019-12-06 DIAGNOSIS — E782 Mixed hyperlipidemia: Secondary | ICD-10-CM

## 2019-12-06 DIAGNOSIS — D509 Iron deficiency anemia, unspecified: Secondary | ICD-10-CM | POA: Diagnosis not present

## 2019-12-06 DIAGNOSIS — I482 Chronic atrial fibrillation, unspecified: Secondary | ICD-10-CM

## 2019-12-06 DIAGNOSIS — E038 Other specified hypothyroidism: Secondary | ICD-10-CM

## 2019-12-06 LAB — COMPREHENSIVE METABOLIC PANEL
ALT: 11 U/L (ref 0–35)
AST: 16 U/L (ref 0–37)
Albumin: 4 g/dL (ref 3.5–5.2)
Alkaline Phosphatase: 56 U/L (ref 39–117)
BUN: 17 mg/dL (ref 6–23)
CO2: 30 mEq/L (ref 19–32)
Calcium: 9.8 mg/dL (ref 8.4–10.5)
Chloride: 102 mEq/L (ref 96–112)
Creatinine, Ser: 0.68 mg/dL (ref 0.40–1.20)
GFR: 80.86 mL/min (ref >60.00)
Glucose, Bld: 91 mg/dL (ref 70–99)
Potassium: 4 mEq/L (ref 3.5–5.1)
Sodium: 136 mEq/L (ref 135–145)
Total Bilirubin: 0.7 mg/dL (ref 0.2–1.2)
Total Protein: 7.5 g/dL (ref 6.0–8.3)

## 2019-12-06 LAB — LIPID PANEL
Cholesterol: 134 mg/dL (ref 0–200)
HDL: 63.8 mg/dL (ref >39.00)
LDL Cholesterol: 58 mg/dL (ref 0–99)
NonHDL: 70.04
Total CHOL/HDL Ratio: 2
Triglycerides: 60 mg/dL (ref 0.0–149.0)
VLDL: 12 mg/dL (ref 0.0–40.0)

## 2019-12-06 LAB — CBC WITH DIFFERENTIAL/PLATELET
Basophils Absolute: 0 10*3/uL (ref 0.0–0.1)
Basophils Relative: 0.7 % (ref 0.0–3.0)
Eosinophils Absolute: 0.1 10*3/uL (ref 0.0–0.7)
Eosinophils Relative: 1.5 % (ref 0.0–5.0)
HCT: 37.8 % (ref 36.0–46.0)
Hemoglobin: 12.9 g/dL (ref 12.0–15.0)
Lymphocytes Relative: 31.9 % (ref 12.0–46.0)
Lymphs Abs: 1.2 10*3/uL (ref 0.7–4.0)
MCHC: 34.1 g/dL (ref 30.0–36.0)
MCV: 98.3 fl (ref 78.0–100.0)
Monocytes Absolute: 0.3 10*3/uL (ref 0.1–1.0)
Monocytes Relative: 7.5 % (ref 3.0–12.0)
Neutro Abs: 2.3 10*3/uL (ref 1.4–7.7)
Neutrophils Relative %: 58.4 % (ref 43.0–77.0)
Platelets: 131 10*3/uL — ABNORMAL LOW (ref 150.0–400.0)
RBC: 3.84 Mil/uL — ABNORMAL LOW (ref 3.87–5.11)
RDW: 12.8 % (ref 11.5–15.5)
WBC: 3.9 10*3/uL — ABNORMAL LOW (ref 4.0–10.5)

## 2019-12-06 LAB — HEMOGLOBIN A1C: Hgb A1c MFr Bld: 5 % (ref 4.6–6.5)

## 2019-12-06 LAB — TSH: TSH: 2.66 u[IU]/mL (ref 0.35–4.50)

## 2019-12-06 MED ORDER — CYANOCOBALAMIN 1000 MCG/ML IJ SOLN
1000.0000 ug | Freq: Once | INTRAMUSCULAR | Status: AC
  Administered 2019-12-06: 1000 ug via INTRAMUSCULAR

## 2019-12-06 NOTE — Assessment & Plan Note (Signed)
Chronic Restart monthly B12 injection, ok to continue oral B12 for now This may contribute to leg weakness - and hopefully she will see an improvement over time.  encouraged regular exercise

## 2019-12-06 NOTE — Assessment & Plan Note (Signed)
Chronic BP well controlled Current regimen effective and well tolerated Continue current medications at current doses cmp  

## 2019-12-06 NOTE — Assessment & Plan Note (Signed)
Chronic Check a1c Low sugar / carb diet Stressed regular exercise  

## 2019-12-06 NOTE — Patient Instructions (Addendum)
  Blood work was ordered.     Medications reviewed and updated.  Changes include :   none    Make monthly B12 injection appointments.    Please followup in 6 months

## 2019-12-06 NOTE — Progress Notes (Signed)
Subjective:    Patient ID: Grace Alvarado, female    DOB: 01-01-1925, 86 y.o.   MRN: [id number]  HPI She is here for follow up of her chronic medical conditions, including hyperlipidemia, hypothyroidism, iron def anemia in past dt LGIB from AVMs, chronic Afib, MR, TR, htn.    She is here with her daughter.   She is taking all of her medications as prescribed.    She has chronic spine pain.  She is seeing Dr Katrinka Blazing for that.    She has been experiencing weakness in her legs.  She feels it when she is walking.  She wondered if it was from not getting the B12 injections.  She has not been taking the oral B12 regularly.  She is doing some walking in the house, but has been less active since covid started.    She has neuropathy in her feet that is unchanged.         Medications and allergies reviewed with patient and updated if appropriate.  Patient Active Problem List   Diagnosis Date Noted  . Compression fracture of fifth lumbar vertebra (HCC) 07/27/2019  . Low back pain 07/20/2019  . Hyperglycemia 05/01/2019  . Neck pain, musculoskeletal 11/16/2018  . Constipation 11/16/2018  . Hypokalemia   . AVM (arteriovenous malformation) of colon   . Lower GI bleed 11/06/2018  . Iron deficiency anemia 11/02/2018  . Lymphedema of left lower extremity 10/22/2018  . B12 deficiency 05/27/2018  . Dependent edema 05/25/2018  . Pulmonary hypertension, primary (HCC) 05/20/2018  . Hyperlipidemia 05/20/2018  . CHF (congestive heart failure) (HCC) 05/20/2018  . Tricuspid regurgitation 05/17/2018  . Mitral regurgitation 05/17/2018  . Chronic a-fib 05/17/2018  . Essential hypertension 05/17/2018  . Hypothyroid 05/17/2018  . CAD (coronary artery disease) 05/17/2018  . Carotid atherosclerosis, bilateral 05/17/2018    Current Outpatient Medications on File Prior to Visit  Medication Sig Dispense Refill  . calcitonin, salmon, (MIACALCIN) 200 UNIT/ACT nasal spray Place 1 spray into alternate  nostrils daily. 3.7 mL 0  . cholecalciferol (VITAMIN D3) 25 MCG (1000 UT) tablet Take 1,000 Units by mouth daily.    Marland Kitchen diltiazem (CARDIZEM SR) 120 MG 12 hr capsule TAKE 1 CAPSULE (120 MG TOTAL) BY MOUTH 2 (TWO) TIMES DAILY. 180 capsule 1  . ferrous gluconate (FERGON) 324 MG tablet Take 2 tablets (648 mg total) by mouth every Monday, Wednesday, and Friday. 90 tablet 0  . furosemide (LASIX) 20 MG tablet Take 1 tablet (20 mg total) by mouth daily. 90 tablet 3  . levothyroxine (SYNTHROID, LEVOTHROID) 75 MCG tablet Take 1 tablet (75 mcg total) by mouth daily before breakfast. 90 tablet 3  . Multiple Vitamin (VITAMIN E/FOLIC ACID/B-6/B-12 PO) Take 1 tablet by mouth daily.    . potassium chloride (KLOR-CON) 10 MEQ tablet TAKE 1 TABLET (10 MEQ) TWICE DAILY. 180 tablet 0  . pravastatin (PRAVACHOL) 10 MG tablet Take 1 tablet (10 mg total) by mouth daily. 90 tablet 3  . Probiotic Product (PRO-BIOTIC BLEND PO) Take 1 capsule by mouth daily.    . vitamin C (ASCORBIC ACID) 500 MG tablet Take 500 mg by mouth daily.    . Vitamin D, Ergocalciferol, (DRISDOL) 1.25 MG (50000 UT) CAPS capsule TAKE 1 CAPSULE (50,000 UNITS TOTAL) BY MOUTH EVERY 7 (SEVEN) DAYS. 12 capsule 0  . XARELTO 20 MG TABS tablet TAKE 1 TABLET (20 MG TOTAL) BY MOUTH DAILY WITH SUPPER 90 tablet 1   No current facility-administered medications on file prior  to visit.    Past Medical History:  Diagnosis Date  . Atrial fibrillation (HCC)   . Hyperlipidemia   . Hypertension   . Hypothyroidism   . Mitral regurgitation   . MVP (mitral valve prolapse)     Past Surgical History:  Procedure Laterality Date  . APPENDECTOMY    . c section    . CESAREAN SECTION    . CHOLECYSTECTOMY    . COLONOSCOPY WITH PROPOFOL N/A 11/09/2018   Procedure: COLONOSCOPY WITH PROPOFOL;  Surgeon: Sherrilyn Rist, MD;  Location: [facility] ENDOSCOPY;  Service: Gastroenterology;  Laterality: N/A;  . ESOPHAGOGASTRODUODENOSCOPY (EGD) WITH PROPOFOL N/A 11/09/2018   Procedure:  ESOPHAGOGASTRODUODENOSCOPY (EGD) WITH PROPOFOL;  Surgeon: Sherrilyn Rist, MD;  Location: [facility] ENDOSCOPY;  Service: Gastroenterology;  Laterality: N/A;  . HOT HEMOSTASIS N/A 11/09/2018   Procedure: HOT HEMOSTASIS (ARGON PLASMA COAGULATION/BICAP);  Surgeon: Sherrilyn Rist, MD;  Location: [facility] ENDOSCOPY;  Service: Gastroenterology;  Laterality: N/A;  . SUBMUCOSAL INJECTION  11/09/2018   Procedure: SUBMUCOSAL INJECTION;  Surgeon: Sherrilyn Rist, MD;  Location: [facility] ENDOSCOPY;  Service: Gastroenterology;;    Social History   Socioeconomic History  . Marital status: Widowed    Spouse name: Not on file  . Number of children: 3  . Years of education: Not on file  . Highest education level: Not on file  Occupational History  . Occupation: retired  Tobacco Use  . Smoking status: Former Games developer  . Smokeless tobacco: Never Used  Substance and Sexual Activity  . Alcohol use: Not Currently  . Drug use: Never  . Sexual activity: Not Currently  Other Topics Concern  . Not on file  Social History Narrative  . Not on file   Social Determinants of Health   Financial Resource Strain:   . Difficulty of Paying Living Expenses: Not on file  Food Insecurity:   . Worried About Programme researcher, broadcasting/film/video in the Last Year: Not on file  . Ran Out of Food in the Last Year: Not on file  Transportation Needs:   . Lack of Transportation (Medical): Not on file  . Lack of Transportation (Non-Medical): Not on file  Physical Activity:   . Days of Exercise per Week: Not on file  . Minutes of Exercise per Session: Not on file  Stress:   . Feeling of Stress : Not on file  Social Connections:   . Frequency of Communication with Friends and Family: Not on file  . Frequency of Social Gatherings with Friends and Family: Not on file  . Attends Religious Services: Not on file  . Active Member of Clubs or Organizations: Not on file  . Attends Banker Meetings: Not on file  . Marital Status: Not on file     Family History  Problem Relation Age of Onset  . Cancer Mother   . Dementia Mother     Review of Systems  Constitutional: Negative for chills, fatigue and fever.  Respiratory: Positive for cough (drainage related). Negative for shortness of breath and wheezing.   Cardiovascular: Positive for leg swelling (controlled). Negative for chest pain and palpitations.  Gastrointestinal: Negative for abdominal pain, blood in stool (no black stool) and nausea.  Musculoskeletal: Positive for back pain.  Neurological: Positive for headaches (occ, mild). Negative for dizziness and light-headedness.  Psychiatric/Behavioral: Negative for dysphoric mood. The patient is not nervous/anxious.        Objective:   Vitals:   12/06/19 1439  BP: Marland Kitchen)  144/72  Pulse: 99  Resp: 16  Temp: 98.2 F (36.8 C)  SpO2: 99%   BP Readings from Last 3 Encounters:  12/06/19 (!) 144/72  07/27/19 122/78  06/07/19 118/62   Wt Readings from Last 3 Encounters:  12/06/19 135 lb (61.2 kg)  07/27/19 136 lb (61.7 kg)  06/07/19 135 lb 1.9 oz (61.3 kg)   Body mass index is 23.91 kg/m.   Physical Exam         Assessment & Plan:    See Problem List for Assessment and Plan of chronic medical problems.

## 2019-12-06 NOTE — Assessment & Plan Note (Signed)
Chronic  Clinically euthyroid Check tsh  Titrate med dose if needed  

## 2019-12-06 NOTE — Assessment & Plan Note (Signed)
Chronic afib Asymptomatic Following with cardiology On xarelto, diltiazem - continue Cbc, cmp

## 2019-12-06 NOTE — Assessment & Plan Note (Signed)
Chronic Stable, controlled Continue lasix 20 mg daily cmp

## 2019-12-06 NOTE — Assessment & Plan Note (Signed)
H/o of iron def anemia dt LGIB dt gastric AVMs No symptoms of bleeding Cbc, iron, TIBC

## 2019-12-06 NOTE — Assessment & Plan Note (Signed)
Chronic Check lipid panel  Continue daily statin Regular exercise and healthy diet encouraged  

## 2019-12-07 ENCOUNTER — Encounter: Payer: Self-pay | Admitting: Podiatry

## 2019-12-07 ENCOUNTER — Telehealth: Payer: Self-pay | Admitting: *Deleted

## 2019-12-07 ENCOUNTER — Encounter: Payer: Self-pay | Admitting: Internal Medicine

## 2019-12-08 ENCOUNTER — Encounter: Payer: Self-pay | Admitting: Internal Medicine

## 2019-12-08 LAB — IRON, TOTAL/TOTAL IRON BINDING CAP
%SAT: 30 % (calc) (ref 16–45)
Iron: 74 ug/dL (ref 45–160)
TIBC: 250 mcg/dL (calc) (ref 250–450)

## 2019-12-08 LAB — TEST AUTHORIZATION

## 2019-12-08 LAB — FERRITIN: Ferritin: 167 ng/mL (ref 16–288)

## 2019-12-09 ENCOUNTER — Encounter: Payer: Self-pay | Admitting: Internal Medicine

## 2019-12-12 ENCOUNTER — Ambulatory Visit: Payer: Medicare Other | Admitting: Podiatry

## 2019-12-14 ENCOUNTER — Encounter: Payer: Self-pay | Admitting: Family Medicine

## 2019-12-15 NOTE — Progress Notes (Signed)
Subjective:   Patient ID: Grace Alvarado, female   DOB: 86 y.o.   MRN: [id number]   HPI 86 year old female presents the office today with a family member for concerns of her nails becoming thickened discolored that she cannot trim her self as well as for a callus on the side of her left big toe, medial aspect is starting to lift.  Is been on for last 1 year.  She denies any drainage or pus coming from the area and she denies any swelling or redness.  She said no recent treatment.   Review of Systems  All other systems reviewed and are negative.  Past Medical History:  Diagnosis Date  . Atrial fibrillation ([place])   . Hyperlipidemia   . Hypertension   . Hypothyroidism   . Mitral regurgitation   . MVP (mitral valve prolapse)     Past Surgical History:  Procedure Laterality Date  . APPENDECTOMY    . c section    . CESAREAN SECTION    . CHOLECYSTECTOMY    . COLONOSCOPY WITH PROPOFOL N/A 11/09/2018   Procedure: COLONOSCOPY WITH PROPOFOL;  Surgeon: Doran Stabler, MD;  Location: [place];  Service: Gastroenterology;  Laterality: N/A;  . ESOPHAGOGASTRODUODENOSCOPY (EGD) WITH PROPOFOL N/A 11/09/2018   Procedure: ESOPHAGOGASTRODUODENOSCOPY (EGD) WITH PROPOFOL;  Surgeon: Doran Stabler, MD;  Location: [place];  Service: Gastroenterology;  Laterality: N/A;  . HOT HEMOSTASIS N/A 11/09/2018   Procedure: HOT HEMOSTASIS (ARGON PLASMA COAGULATION/BICAP);  Surgeon: Doran Stabler, MD;  Location: [place];  Service: Gastroenterology;  Laterality: N/A;  . SUBMUCOSAL INJECTION  11/09/2018   Procedure: SUBMUCOSAL INJECTION;  Surgeon: Doran Stabler, MD;  Location: [place];  Service: Gastroenterology;;     Current Outpatient Medications:  .  calcitonin, salmon, (MIACALCIN) 200 UNIT/ACT nasal spray, Place 1 spray into alternate nostrils daily., Disp: 3.7 mL, Rfl: 0 .  cholecalciferol (VITAMIN D3) 25 MCG (1000 UT) tablet, Take 1,000 Units by mouth daily., Disp: , Rfl:  .   diltiazem (CARDIZEM SR) 120 MG 12 hr capsule, TAKE 1 CAPSULE (120 MG TOTAL) BY MOUTH 2 (TWO) TIMES DAILY., Disp: 180 capsule, Rfl: 1 .  ferrous gluconate (FERGON) 324 MG tablet, Take 2 tablets (648 mg total) by mouth every Monday, Wednesday, and Friday., Disp: 90 tablet, Rfl: 0 .  furosemide (LASIX) 20 MG tablet, Take 1 tablet (20 mg total) by mouth daily., Disp: 90 tablet, Rfl: 3 .  levothyroxine (SYNTHROID, LEVOTHROID) 75 MCG tablet, Take 1 tablet (75 mcg total) by mouth daily before breakfast., Disp: 90 tablet, Rfl: 3 .  Multiple Vitamin (VITAMIN E/FOLIC KNLZ/J-6/B-34 PO), Take 1 tablet by mouth daily., Disp: , Rfl:  .  potassium chloride (KLOR-CON) 10 MEQ tablet, TAKE 1 TABLET (10 MEQ) TWICE DAILY., Disp: 180 tablet, Rfl: 0 .  pravastatin (PRAVACHOL) 10 MG tablet, Take 1 tablet (10 mg total) by mouth daily., Disp: 90 tablet, Rfl: 3 .  Probiotic Product (PRO-BIOTIC BLEND PO), Take 1 capsule by mouth daily., Disp: , Rfl:  .  vitamin C (ASCORBIC ACID) 500 MG tablet, Take 500 mg by mouth daily., Disp: , Rfl:  .  Vitamin D, Ergocalciferol, (DRISDOL) 1.25 MG (50000 UT) CAPS capsule, TAKE 1 CAPSULE (50,000 UNITS TOTAL) BY MOUTH EVERY 7 (SEVEN) DAYS., Disp: 12 capsule, Rfl: 0 .  XARELTO 20 MG TABS tablet, TAKE 1 TABLET (20 MG TOTAL) BY MOUTH DAILY WITH SUPPER, Disp: 90 tablet, Rfl: 1  Allergies  Allergen Reactions  . Codeine Other (  See Comments)    unknown  . Penicillins Other (See Comments)    unknown  . Vancomycin Other (See Comments)    unknown  . Zithromax [Azithromycin] Other (See Comments)    unknown  . Latex Rash         Objective:  Physical Exam  General: AAO x3, NAD  Dermatological: Nails are hypertrophic, dystrophic, brittle, discolored, elongated 10. No surrounding redness or drainage. Tenderness nails 1-5 bilaterally.  Hyperkeratotic lesion left medial hallux.  Upon debridement there is no underlying ulceration, drainage or any signs of infection.  The calluses started to  become thicker and starting the left.  No open lesions or pre-ulcerative lesions are identified today.   Vascular: Dorsalis Pedis artery and Posterior Tibial artery pedal pulses are 2/4 bilateral with immedate capillary fill time. There is no pain with calf compression, swelling, warmth, erythema.   Neruologic: Grossly intact via light touch bilateral.   Musculoskeletal: No gross boney pedal deformities bilateral.   Gait: Unassisted, Nonantalgic.       Assessment:   Symptomatic onychomycosis, hyperkeratotic lesion    Plan:  -Treatment options discussed including all alternatives, risks, and complications -Etiology of symptoms were discussed -Nails debrided x10 without any complications or bleeding -Hyperkeratotic lesion sharply debrided x1 without any complications or bleeding.  Moisturizer daily.  Continue offloading. -Discussed the importance of daily foot inspection.  Return in about 3 months (around 03/06/2020) for nail/callus trim .  Vivi Barrack DPM

## 2019-12-20 ENCOUNTER — Encounter: Payer: Self-pay | Admitting: Internal Medicine

## 2019-12-20 ENCOUNTER — Other Ambulatory Visit: Payer: Self-pay | Admitting: Cardiology

## 2019-12-20 ENCOUNTER — Other Ambulatory Visit: Payer: Self-pay | Admitting: Podiatry

## 2019-12-20 ENCOUNTER — Encounter: Payer: Self-pay | Admitting: Podiatry

## 2019-12-20 DIAGNOSIS — R609 Edema, unspecified: Secondary | ICD-10-CM

## 2019-12-20 MED ORDER — DILTIAZEM HCL ER 120 MG PO CP12: 120.0000 mg | ORAL_CAPSULE | Freq: Two times a day (BID) | ORAL | 1 refills | Status: DC

## 2019-12-20 MED ORDER — DOXYCYCLINE HYCLATE 100 MG PO TABS: 100.0000 mg | ORAL_TABLET | Freq: Two times a day (BID) | ORAL | 0 refills | Status: DC

## 2019-12-21 ENCOUNTER — Other Ambulatory Visit: Payer: Self-pay | Admitting: Internal Medicine

## 2019-12-21 ENCOUNTER — Other Ambulatory Visit: Payer: Self-pay | Admitting: *Deleted

## 2019-12-21 DIAGNOSIS — R609 Edema, unspecified: Secondary | ICD-10-CM

## 2019-12-21 MED ORDER — FUROSEMIDE 20 MG PO TABS: ORAL_TABLET | ORAL | 1 refills | Status: DC

## 2019-12-24 ENCOUNTER — Encounter: Payer: Self-pay | Admitting: Podiatry

## 2019-12-26 ENCOUNTER — Ambulatory Visit: Payer: Medicare Other | Admitting: Podiatry

## 2019-12-26 ENCOUNTER — Other Ambulatory Visit: Payer: Self-pay

## 2019-12-26 ENCOUNTER — Encounter: Payer: Self-pay | Admitting: Podiatry

## 2019-12-26 DIAGNOSIS — M79675 Pain in left toe(s): Secondary | ICD-10-CM

## 2019-12-26 DIAGNOSIS — L600 Ingrowing nail: Secondary | ICD-10-CM

## 2019-12-26 DIAGNOSIS — M79674 Pain in right toe(s): Secondary | ICD-10-CM

## 2020-01-01 NOTE — Progress Notes (Signed)
Subjective: 86 year old female presents the office today for follow-up evaluation and concerns of her right second digit toenail redness.  After last appointment she developed some redness and swelling to the medial second digit nail border.  She only took 2 doses of antibiotics and she stopped this because of potential side effects. She has been keeping antibiotic ointment on it and overall feels much better.  No drainage or pus. Denies any systemic complaints such as fevers, chills, nausea, vomiting. No acute changes since last appointment, and no other complaints at this time.   Objective: AAO x3, NAD DP/PT pulses palpable bilaterally, CRT less than 3 seconds Mild incurvation present to the nail borders particularly right medial second digit.  There is faint erythema there is no drainage or pus there is no tenderness palpation.  There is no ascending cellulitis. No open lesions or pre-ulcerative lesions.  No pain with calf compression, swelling, warmth, erythema  Assessment: Ingrown toenail  Plan: -All treatment options discussed with the patient including all alternatives, risks, complications.  -I want her to continue antibiotic ointment dressing changes daily.  Since she is doing much better we will hold off on the oral antibiotics but there is any worsening of the redness, swelling or pain to let me know. -Patient encouraged to call the office with any questions, concerns, change in symptoms.   Vivi Barrack DPM

## 2020-01-03 ENCOUNTER — Ambulatory Visit: Payer: Medicare Other | Admitting: Cardiology

## 2020-01-03 ENCOUNTER — Other Ambulatory Visit (HOSPITAL_COMMUNITY): Payer: Self-pay | Admitting: Cardiovascular Disease

## 2020-01-03 ENCOUNTER — Encounter: Payer: Self-pay | Admitting: Cardiology

## 2020-01-03 ENCOUNTER — Other Ambulatory Visit: Payer: Self-pay

## 2020-01-03 VITALS — BP 120/60 | Temp 98.4°F | Ht 64.0 in | Wt 134.4 lb

## 2020-01-03 DIAGNOSIS — I739 Peripheral vascular disease, unspecified: Secondary | ICD-10-CM | POA: Diagnosis not present

## 2020-01-03 DIAGNOSIS — M79605 Pain in left leg: Secondary | ICD-10-CM

## 2020-01-03 DIAGNOSIS — M79604 Pain in right leg: Secondary | ICD-10-CM | POA: Diagnosis not present

## 2020-01-03 DIAGNOSIS — I482 Chronic atrial fibrillation, unspecified: Secondary | ICD-10-CM

## 2020-01-03 DIAGNOSIS — M79606 Pain in leg, unspecified: Secondary | ICD-10-CM | POA: Insufficient documentation

## 2020-01-03 DIAGNOSIS — I10 Essential (primary) hypertension: Secondary | ICD-10-CM | POA: Diagnosis not present

## 2020-01-03 DIAGNOSIS — Z7901 Long term (current) use of anticoagulants: Secondary | ICD-10-CM

## 2020-01-03 DIAGNOSIS — I340 Nonrheumatic mitral (valve) insufficiency: Secondary | ICD-10-CM | POA: Diagnosis not present

## 2020-01-03 NOTE — Assessment & Plan Note (Signed)
Rate control- failed Amiodarone and Multac

## 2020-01-03 NOTE — Progress Notes (Signed)
Cardiology Office Note:    Date:  01/03/2020   ID:  Grace Alvarado, DOB 1924-12-14, MRN [id number]  PCP:  Pincus Sanes, MD  Cardiologist:  Dr Jens Som Electrophysiologist:  None   Referring MD: Pincus Sanes, MD   CC: bilateral LE pain when walking  History of Present Illness:    Grace Alvarado is a 86 y.o. female with a hx of permanent atrial fibrillation.  She has failed antiarrhythmic therapy in the past.  She has moderate to severe mitral regurgitation, she does not want to consider surgery if needed.  She is here for routine follow-up.  Her last echocardiogram was in January 2020.  She is done pretty well from a cardiac standpoint.  She denies any unusual shortness of breath with exertion.  Her main complaint is pain in her lower legs when she walks.  She denies any pain in her legs at night.  She says when she walks her both lower legs from the knees down ache.  When she stops walking this improves.  Past Medical History:  Diagnosis Date  . Atrial fibrillation (HCC)   . Hyperlipidemia   . Hypertension   . Hypothyroidism   . Mitral regurgitation   . MVP (mitral valve prolapse)     Past Surgical History:  Procedure Laterality Date  . APPENDECTOMY    . c section    . CESAREAN SECTION    . CHOLECYSTECTOMY    . COLONOSCOPY WITH PROPOFOL N/A 11/09/2018   Procedure: COLONOSCOPY WITH PROPOFOL;  Surgeon: Sherrilyn Rist, MD;  Location: [facility] ENDOSCOPY;  Service: Gastroenterology;  Laterality: N/A;  . ESOPHAGOGASTRODUODENOSCOPY (EGD) WITH PROPOFOL N/A 11/09/2018   Procedure: ESOPHAGOGASTRODUODENOSCOPY (EGD) WITH PROPOFOL;  Surgeon: Sherrilyn Rist, MD;  Location: [facility] ENDOSCOPY;  Service: Gastroenterology;  Laterality: N/A;  . HOT HEMOSTASIS N/A 11/09/2018   Procedure: HOT HEMOSTASIS (ARGON PLASMA COAGULATION/BICAP);  Surgeon: Sherrilyn Rist, MD;  Location: [facility] ENDOSCOPY;  Service: Gastroenterology;  Laterality: N/A;  . SUBMUCOSAL INJECTION  11/09/2018   Procedure: SUBMUCOSAL  INJECTION;  Surgeon: Sherrilyn Rist, MD;  Location: [facility] ENDOSCOPY;  Service: Gastroenterology;;    Current Medications: Current Meds  Medication Sig  . calcitonin, salmon, (MIACALCIN) 200 UNIT/ACT nasal spray Place 1 spray into alternate nostrils daily.  . cholecalciferol (VITAMIN D3) 25 MCG (1000 UT) tablet Take 1,000 Units by mouth daily.  Marland Kitchen diltiazem (CARDIZEM SR) 120 MG 12 hr capsule Take 1 capsule (120 mg total) by mouth 2 (two) times daily. Keep upcoming appt for future refills  . doxycycline (VIBRA-TABS) 100 MG tablet Take 1 tablet (100 mg total) by mouth 2 (two) times daily.  . ferrous gluconate (FERGON) 324 MG tablet Take 2 tablets (648 mg total) by mouth every Monday, Wednesday, and Friday.  . furosemide (LASIX) 20 MG tablet Take  40 mg every day Keep upcoming appt for future refills ./cy  . levothyroxine (SYNTHROID) 75 MCG tablet TAKE 1 TABLET (75 MCG TOTAL) BY MOUTH DAILY BEFORE BREAKFAST.  . Multiple Vitamin (VITAMIN E/FOLIC ACID/B-6/B-12 PO) Take 1 tablet by mouth daily.  . potassium chloride (KLOR-CON) 10 MEQ tablet TAKE 1 TABLET (10 MEQ) TWICE DAILY.  . pravastatin (PRAVACHOL) 10 MG tablet Take 1 tablet (10 mg total) by mouth daily.  . Probiotic Product (PRO-BIOTIC BLEND PO) Take 1 capsule by mouth daily.  . vitamin C (ASCORBIC ACID) 500 MG tablet Take 500 mg by mouth daily.  . Vitamin D, Ergocalciferol, (DRISDOL) 1.25 MG (50000 UT) CAPS capsule TAKE  1 CAPSULE (50,000 UNITS TOTAL) BY MOUTH EVERY 7 (SEVEN) DAYS.  Marland Kitchen XARELTO 20 MG TABS tablet TAKE 1 TABLET (20 MG TOTAL) BY MOUTH DAILY WITH SUPPER     Allergies:   Codeine, Penicillins, Vancomycin, Zithromax [azithromycin], and Latex   Social History   Socioeconomic History  . Marital status: Widowed    Spouse name: Not on file  . Number of children: 3  . Years of education: Not on file  . Highest education level: Not on file  Occupational History  . Occupation: retired  Tobacco Use  . Smoking status: Former Research scientist (life sciences)    . Smokeless tobacco: Never Used  Substance and Sexual Activity  . Alcohol use: Not Currently  . Drug use: Never  . Sexual activity: Not Currently  Other Topics Concern  . Not on file  Social History Narrative  . Not on file   Social Determinants of Health   Financial Resource Strain:   . Difficulty of Paying Living Expenses:   Food Insecurity:   . Worried About Charity fundraiser in the Last Year:   . Arboriculturist in the Last Year:   Transportation Needs:   . Film/video editor (Medical):   Marland Kitchen Lack of Transportation (Non-Medical):   Physical Activity:   . Days of Exercise per Week:   . Minutes of Exercise per Session:   Stress:   . Feeling of Stress :   Social Connections:   . Frequency of Communication with Friends and Family:   . Frequency of Social Gatherings with Friends and Family:   . Attends Religious Services:   . Active Member of Clubs or Organizations:   . Attends Archivist Meetings:   Marland Kitchen Marital Status:      Family History: The patient's family history includes Cancer in her mother; Dementia in her mother.  ROS:   Please see the history of present illness.     All other systems reviewed and are negative.  EKGs/Labs/Other Studies Reviewed:    The following studies were reviewed today: Echo Jan 2020  EKG:  EKG is ordered today.  The ekg ordered today demonstrates AF with VR 95  Recent Labs: 12/06/2019: ALT 11; BUN 17; Creatinine, Ser 0.68; Hemoglobin 12.9; Platelets 131.0; Potassium 4.0; Sodium 136; TSH 2.66  Recent Lipid Panel    Component Value Date/Time   CHOL 134 12/06/2019 1536   TRIG 60.0 12/06/2019 1536   HDL 63.80 12/06/2019 1536   CHOLHDL 2 12/06/2019 1536   VLDL 12.0 12/06/2019 1536   LDLCALC 58 12/06/2019 1536    Physical Exam:    VS:  BP 120/60   Temp 98.4 F (36.9 C)   Ht 5\' 4"  (1.626 m)   Wt 134 lb 6.4 oz (61 kg)   LMP  (LMP Unknown)   SpO2 99%   BMI 23.07 kg/m     Wt Readings from Last 3 Encounters:   01/03/20 134 lb 6.4 oz (61 kg)  12/06/19 135 lb (61.2 kg)  07/27/19 136 lb (61.7 kg)     GEN:  Well nourished, well developed in no acute distress HEENT: Normal NECK: No JVD; No carotid bruits CARDIAC: irregularly irregular, 2/6 systolic murmur Lt axillary area, no rubs, gallops RESPIRATORY:  Clear to auscultation without rales, wheezing or rhonchi  ABDOMEN: Soft, non-tender, non-distended MUSCULOSKELETAL:  No edema; No deformity- distal pedal and dorsalis pulses diminished.  No hair growth on toes or feet.  SKIN: Warm and dry NEUROLOGIC:  Alert and oriented  x 3 PSYCHIATRIC:  Normal affect   ASSESSMENT:    Chronic a-fib Rate control- failed Amiodarone and Multac  Essential hypertension Controlled  Mitral regurgitation Moderate to severe central mitral regurg. DOES NOT WANT SURGERY   Leg pain Pt complains of bilateral lower leg pain that seems to be exertional- concerning for claudication  Chronic anticoagulation Xarelto 20 mg daily  PLAN:    Recent labs ordered by PCP reviewed. Check LEA dopplers.  F/U Dr Jens Som in one year.    Medication Adjustments/Labs and Tests Ordered: Current medicines are reviewed at length with the patient today.  Concerns regarding medicines are outlined above.  Orders Placed This Encounter  Procedures  . EKG 12-Lead  . VAS [place] ABI WITH/WO TBI  . VAS [place] LOWER EXTREMITY ARTERIAL DUPLEX   No orders of the defined types were placed in this encounter.   Patient Instructions  Medication Instructions:  The current medical regimen is effective;  continue present plan and medications as directed. Please refer to the Current Medication list given to you today. *If you need a refill on your cardiac medications before your next appointment, please call your pharmacy*  Testing/Procedures: Your physician has requested that you have an ankle brachial index (ABI). During this test an ultrasound and blood pressure cuff are used to evaluate the  arteries that supply the arms and legs with blood. Allow thirty minutes for this exam. There are no restrictions or special instructions.  Follow-Up: Your next appointment:  12 month(s)  In Person with Olga Millers, MD  At [facility], you and your health needs are our priority.  As part of our continuing mission to provide you with exceptional heart care, we have created designated Provider Care Teams.  These Care Teams include your primary Cardiologist (physician) and Advanced Practice Providers (APPs -  Physician Assistants and Nurse Practitioners) who all work together to provide you with the care you need, when you need it.  We recommend signing up for the patient portal called "MyChart".  Sign up information is provided on this After Visit Summary.  MyChart is used to connect with patients for Virtual Visits (Telemedicine).  Patients are able to view lab/test results, encounter notes, upcoming appointments, etc.  Non-urgent messages can be sent to your provider as well.   To learn more about what you can do with MyChart, go to [email or web address].         Jolene Provost, PA-C  01/03/2020 10:58 AM    [facility]

## 2020-01-03 NOTE — Assessment & Plan Note (Signed)
Xarelto 20 mg daily

## 2020-01-03 NOTE — Patient Instructions (Signed)
Medication Instructions:  The current medical regimen is effective;  continue present plan and medications as directed. Please refer to the Current Medication list given to you today. *If you need a refill on your cardiac medications before your next appointment, please call your pharmacy*  Testing/Procedures: Your physician has requested that you have an ankle brachial index (ABI). During this test an ultrasound and blood pressure cuff are used to evaluate the arteries that supply the arms and legs with blood. Allow thirty minutes for this exam. There are no restrictions or special instructions.  Follow-Up: Your next appointment:  12 month(s)  In Person with Olga Millers, MD  At [facility], you and your health needs are our priority.  As part of our continuing mission to provide you with exceptional heart care, we have created designated Provider Care Teams.  These Care Teams include your primary Cardiologist (physician) and Advanced Practice Providers (APPs -  Physician Assistants and Nurse Practitioners) who all work together to provide you with the care you need, when you need it.  We recommend signing up for the patient portal called "MyChart".  Sign up information is provided on this After Visit Summary.  MyChart is used to connect with patients for Virtual Visits (Telemedicine).  Patients are able to view lab/test results, encounter notes, upcoming appointments, etc.  Non-urgent messages can be sent to your provider as well.   To learn more about what you can do with MyChart, go to [email or web address].

## 2020-01-03 NOTE — Assessment & Plan Note (Signed)
Pt complains of bilateral lower leg pain that seems to be exertional- concerning for claudication

## 2020-01-03 NOTE — Assessment & Plan Note (Signed)
Moderate to severe central mitral regurg. DOES NOT WANT SURGERY

## 2020-01-03 NOTE — Assessment & Plan Note (Signed)
Controlled.  

## 2020-01-04 ENCOUNTER — Encounter: Payer: Self-pay | Admitting: Internal Medicine

## 2020-01-04 NOTE — Telephone Encounter (Signed)
Entered in error

## 2020-01-05 ENCOUNTER — Ambulatory Visit: Payer: Medicare Other

## 2020-01-05 ENCOUNTER — Other Ambulatory Visit: Payer: Self-pay

## 2020-01-05 ENCOUNTER — Ambulatory Visit (INDEPENDENT_AMBULATORY_CARE_PROVIDER_SITE_OTHER): Payer: Medicare Other | Admitting: *Deleted

## 2020-01-05 DIAGNOSIS — E538 Deficiency of other specified B group vitamins: Secondary | ICD-10-CM

## 2020-01-05 MED ORDER — CYANOCOBALAMIN 1000 MCG/ML IJ SOLN
1000.0000 ug | Freq: Once | INTRAMUSCULAR | Status: AC
  Administered 2020-01-05: 1000 ug via INTRAMUSCULAR

## 2020-01-05 NOTE — Progress Notes (Signed)
Pls cosign for B12 inj../lmb  

## 2020-01-09 ENCOUNTER — Ambulatory Visit (HOSPITAL_COMMUNITY)
Admission: RE | Admit: 2020-01-09 | Discharge: 2020-01-09 | Disposition: A | Discharge status: Home / Self Care | Payer: Medicare Other | Source: Ambulatory Visit | Attending: Internal Medicine | Admitting: Internal Medicine

## 2020-01-09 ENCOUNTER — Other Ambulatory Visit: Payer: Self-pay

## 2020-01-09 DIAGNOSIS — I739 Peripheral vascular disease, unspecified: Secondary | ICD-10-CM | POA: Diagnosis present

## 2020-01-12 ENCOUNTER — Other Ambulatory Visit: Payer: Self-pay | Admitting: Cardiology

## 2020-01-12 DIAGNOSIS — R609 Edema, unspecified: Secondary | ICD-10-CM

## 2020-01-20 ENCOUNTER — Encounter (HOSPITAL_COMMUNITY): Payer: Medicare Other

## 2020-01-23 ENCOUNTER — Telehealth: Payer: Self-pay

## 2020-01-23 DIAGNOSIS — S22080A Wedge compression fracture of T11-T12 vertebra, initial encounter for closed fracture: Secondary | ICD-10-CM | POA: Insufficient documentation

## 2020-01-23 MED ORDER — DILTIAZEM HCL ER 120 MG PO CP12: 120.0000 mg | ORAL_CAPSULE | Freq: Two times a day (BID) | ORAL | 3 refills | Status: DC

## 2020-01-23 NOTE — Progress Notes (Addendum)
Subjective:    Patient ID: Grace Alvarado, female    DOB: 01-29-1925, 86 y.o.   MRN: [id number]  HPI The patient is here for an acute visit.  She is here with her daughter.   She has continuous mid and lower back pain.   She is a chronic compression fractures.  She also has degenerative changes in her spine.  Her legs are very week - She walks in the house - does about 5000 steps a day.  Her legs feel tired when she does that but she pushes herself.  No improvement with walking daily.  She was wondering what the cause was-if it was related to her back or neuropathy.  She has had neuropathy for years.  She denies any numbness or tingling in her legs.  She will have intermittent burning or hot feeling in her feet at times.  Her legs are weak-he just feels tired.  Resting improves the pain.  She did have evaluation by cardiology for PAD and it was negative.  She does have difficulty getting up from a chair and uses her arms to help push her.  She denies any knee or hip pain.  Her balance is not great.  Is occasionally off.  She is very careful not to fall.  She denies any muscle pain in her legs.  She denies muscle weakness in her arms.      Medications and allergies reviewed with patient and updated if appropriate.  Patient Active Problem List   Diagnosis Date Noted  . Compression fracture of T11 vertebra ([place]) 01/23/2020  . Leg pain 01/03/2020  . Chronic anticoagulation 01/03/2020  . Compression fracture of L1 lumbar vertebra ([place]) 07/27/2019  . Low back pain 07/20/2019  . Hyperglycemia 05/01/2019  . Neck pain, musculoskeletal 11/16/2018  . Constipation 11/16/2018  . Hypokalemia   . AVM (arteriovenous malformation) of colon   . Lower GI bleed 11/06/2018  . Iron deficiency anemia 11/02/2018  . Lymphedema of left lower extremity 10/22/2018  . B12 deficiency 05/27/2018  . Dependent edema 05/25/2018  . Pulmonary hypertension, primary ([place]) 05/20/2018  . Hyperlipidemia  05/20/2018  . CHF (congestive heart failure) ([place]) 05/20/2018  . Tricuspid regurgitation 05/17/2018  . Mitral regurgitation 05/17/2018  . Chronic a-fib 05/17/2018  . Essential hypertension 05/17/2018  . Hypothyroid 05/17/2018  . CAD (coronary artery disease) 05/17/2018  . Carotid atherosclerosis, bilateral 05/17/2018    Current Outpatient Medications on File Prior to Visit  Medication Sig Dispense Refill  . cholecalciferol (VITAMIN D3) 25 MCG (1000 UT) tablet Take 1,000 Units by mouth daily.    Marland Kitchen diltiazem (CARDIZEM SR) 120 MG 12 hr capsule Take 1 capsule (120 mg total) by mouth 2 (two) times daily. 90 capsule 3  . ferrous gluconate (FERGON) 324 MG tablet Take 2 tablets (648 mg total) by mouth every Monday, Wednesday, and Friday. 90 tablet 0  . furosemide (LASIX) 20 MG tablet TAKE 40 MG EVERY DAY KEEP UPCOMING APPT FOR FUTURE REFILLS ./CY 60 tablet 10  . levothyroxine (SYNTHROID) 75 MCG tablet TAKE 1 TABLET (75 MCG TOTAL) BY MOUTH DAILY BEFORE BREAKFAST. 90 tablet 1  . Multiple Vitamin (VITAMIN E/FOLIC NIDP/O-2/U-23 PO) Take 1 tablet by mouth daily.    . potassium chloride (KLOR-CON) 10 MEQ tablet TAKE 1 TABLET (10 MEQ) TWICE DAILY. 180 tablet 0  . pravastatin (PRAVACHOL) 10 MG tablet Take 1 tablet (10 mg total) by mouth daily. 90 tablet 3  . Probiotic Product (PRO-BIOTIC BLEND PO) Take 1 capsule  by mouth daily.    . vitamin C (ASCORBIC ACID) 500 MG tablet Take 500 mg by mouth daily.    . Vitamin D, Ergocalciferol, (DRISDOL) 1.25 MG (50000 UT) CAPS capsule TAKE 1 CAPSULE (50,000 UNITS TOTAL) BY MOUTH EVERY 7 (SEVEN) DAYS. 12 capsule 0  . XARELTO 20 MG TABS tablet TAKE 1 TABLET (20 MG TOTAL) BY MOUTH DAILY WITH SUPPER 90 tablet 1   No current facility-administered medications on file prior to visit.    Past Medical History:  Diagnosis Date  . Atrial fibrillation (HCC)   . Hyperlipidemia   . Hypertension   . Hypothyroidism   . Mitral regurgitation   . MVP (mitral valve prolapse)      Past Surgical History:  Procedure Laterality Date  . APPENDECTOMY    . c section    . CESAREAN SECTION    . CHOLECYSTECTOMY    . COLONOSCOPY WITH PROPOFOL N/A 11/09/2018   Procedure: COLONOSCOPY WITH PROPOFOL;  Surgeon: Sherrilyn Rist, MD;  Location: [facility] ENDOSCOPY;  Service: Gastroenterology;  Laterality: N/A;  . ESOPHAGOGASTRODUODENOSCOPY (EGD) WITH PROPOFOL N/A 11/09/2018   Procedure: ESOPHAGOGASTRODUODENOSCOPY (EGD) WITH PROPOFOL;  Surgeon: Sherrilyn Rist, MD;  Location: [facility] ENDOSCOPY;  Service: Gastroenterology;  Laterality: N/A;  . HOT HEMOSTASIS N/A 11/09/2018   Procedure: HOT HEMOSTASIS (ARGON PLASMA COAGULATION/BICAP);  Surgeon: Sherrilyn Rist, MD;  Location: [facility] ENDOSCOPY;  Service: Gastroenterology;  Laterality: N/A;  . SUBMUCOSAL INJECTION  11/09/2018   Procedure: SUBMUCOSAL INJECTION;  Surgeon: Sherrilyn Rist, MD;  Location: [facility] ENDOSCOPY;  Service: Gastroenterology;;    Social History   Socioeconomic History  . Marital status: Widowed    Spouse name: Not on file  . Number of children: 3  . Years of education: Not on file  . Highest education level: Not on file  Occupational History  . Occupation: retired  Tobacco Use  . Smoking status: Former Games developer  . Smokeless tobacco: Never Used  Substance and Sexual Activity  . Alcohol use: Not Currently  . Drug use: Never  . Sexual activity: Not Currently  Other Topics Concern  . Not on file  Social History Narrative  . Not on file   Social Determinants of Health   Financial Resource Strain:   . Difficulty of Paying Living Expenses:   Food Insecurity:   . Worried About Programme researcher, broadcasting/film/video in the Last Year:   . Barista in the Last Year:   Transportation Needs:   . Freight forwarder (Medical):   Marland Kitchen Lack of Transportation (Non-Medical):   Physical Activity:   . Days of Exercise per Week:   . Minutes of Exercise per Session:   Stress:   . Feeling of Stress :   Social Connections:   . Frequency  of Communication with Friends and Family:   . Frequency of Social Gatherings with Friends and Family:   . Attends Religious Services:   . Active Member of Clubs or Organizations:   . Attends Banker Meetings:   Marland Kitchen Marital Status:     Family History  Problem Relation Age of Onset  . Cancer Mother   . Dementia Mother     Review of Systems  Neurological: Negative for numbness.       Objective:   Vitals:   01/24/20 1057  BP: (!) 142/72  Pulse: 75  Resp: 16  Temp: 98.6 F (37 C)  SpO2: 97%   BP Readings from Last 3 Encounters:  01/24/20 (!) 142/72  01/03/20 120/60  12/06/19 (!) 144/72   Wt Readings from Last 3 Encounters:  01/24/20 134 lb 6.4 oz (61 kg)  01/03/20 134 lb 6.4 oz (61 kg)  12/06/19 135 lb (61.2 kg)   Body mass index is 23.07 kg/m.   Physical Exam Constitutional:      General: She is not in acute distress.    Appearance: Normal appearance. She is not ill-appearing.  HENT:     Head: Normocephalic and atraumatic.  Musculoskeletal:        General: Tenderness (tenderness with palpation lumbar spine and mid thoracic spine) present.     Right lower leg: No edema.     Left lower leg: No edema.     Comments: No muscle tenderness with palpation of LE b/l  Skin:    General: Skin is warm and dry.  Neurological:     Mental Status: She is alert.     Sensory: Sensory deficit (dec sensation b/l lower legs and feet) present.     Motor: No weakness (no weakness in b/l legs).            Assessment & Plan:    See Problem List for Assessment and Plan of chronic medical problems.    This visit occurred during the SARS-CoV-2 public health emergency.  Safety protocols were in place, including screening questions prior to the visit, additional usage of staff PPE, and extensive cleaning of exam room while observing appropriate contact time as indicated for disinfecting solutions.

## 2020-01-24 ENCOUNTER — Ambulatory Visit: Payer: Medicare Other | Admitting: Internal Medicine

## 2020-01-24 ENCOUNTER — Other Ambulatory Visit: Payer: Self-pay

## 2020-01-24 ENCOUNTER — Ambulatory Visit: Payer: Medicare Other | Admitting: Family Medicine

## 2020-01-24 ENCOUNTER — Encounter: Payer: Self-pay | Admitting: Internal Medicine

## 2020-01-24 DIAGNOSIS — R29898 Other symptoms and signs involving the musculoskeletal system: Secondary | ICD-10-CM | POA: Diagnosis not present

## 2020-01-24 DIAGNOSIS — S22080S Wedge compression fracture of T11-T12 vertebra, sequela: Secondary | ICD-10-CM

## 2020-01-24 DIAGNOSIS — M549 Dorsalgia, unspecified: Secondary | ICD-10-CM

## 2020-01-24 DIAGNOSIS — G8929 Other chronic pain: Secondary | ICD-10-CM | POA: Insufficient documentation

## 2020-01-24 DIAGNOSIS — G629 Polyneuropathy, unspecified: Secondary | ICD-10-CM | POA: Diagnosis not present

## 2020-01-24 MED ORDER — VITAMIN D (ERGOCALCIFEROL) 1.25 MG (50000 UNIT) PO CAPS: 50000.0000 [IU] | ORAL_CAPSULE | ORAL | 0 refills | Status: DC

## 2020-01-24 NOTE — Assessment & Plan Note (Signed)
Chronic She has had neuropathy for years Denies any numbness or tingling Has an intermittent burning sensation in her feet On exam she does have decreased sensation in her lower legs Likely contributing to her poor balance and some of her leg tiredness Declined physical therapy Declined seeing neurology

## 2020-01-24 NOTE — Assessment & Plan Note (Signed)
Chronic Mid back and lower back Likely related to compression fractures Advised taking Tylenol twice daily to see if that helps with some of the pain may ultimately help with her quality of life and some of her leg symptoms

## 2020-01-24 NOTE — Assessment & Plan Note (Signed)
She likely has not a bone density test, but she declined.  Advised her to consider this.  Discussed that we need the bone density to prescribe medication that would help with her bones She will think about this Discussed that she is at an increased risk of breaking another bone in her back, hip or even her wrist

## 2020-01-24 NOTE — Patient Instructions (Addendum)
Take the high dose vitamin d weekly for two months, then restart calcium 500-600 mg and vitamin d 1000 units twice a day.   Take 2 tylenol twice a day.     Think about getting a bone density.     Keep walking.

## 2020-01-24 NOTE — Assessment & Plan Note (Signed)
Acute Complains of leg weakness, which is not new She is walking regularly in her apartment and has been doing that for a while-walks about 5000 steps and there has been no improvement with walking consistently She does need to use her arms to help her up from the chair She complains mostly of tiredness in her legs Tiredness improves with rest Possibly related to chronic back pain, neuropathy No obvious arthritis in lower extremities Discussed further evaluation with neurology, but she declined Declined PT High-dose vitamin D, then start calcium and vitamin D daily Continue B12 injections Continue regular walking Advised trying Tylenol twice daily to see if helping her back pain helps her leg study

## 2020-01-31 ENCOUNTER — Ambulatory Visit: Payer: Medicare Other | Admitting: Cardiology

## 2020-02-05 ENCOUNTER — Encounter: Payer: Self-pay | Admitting: Internal Medicine

## 2020-02-06 ENCOUNTER — Ambulatory Visit: Payer: Medicare Other

## 2020-02-06 ENCOUNTER — Ambulatory Visit: Payer: Medicare Other | Admitting: Internal Medicine

## 2020-02-07 ENCOUNTER — Encounter: Payer: Self-pay | Admitting: Family

## 2020-02-07 ENCOUNTER — Ambulatory Visit (INDEPENDENT_AMBULATORY_CARE_PROVIDER_SITE_OTHER): Payer: Medicare Other

## 2020-02-07 ENCOUNTER — Ambulatory Visit: Payer: Medicare Other | Admitting: Family

## 2020-02-07 ENCOUNTER — Other Ambulatory Visit: Payer: Self-pay

## 2020-02-07 VITALS — BP 140/70 | HR 82 | Temp 98.1°F | Ht 64.0 in | Wt 132.0 lb

## 2020-02-07 DIAGNOSIS — M79641 Pain in right hand: Secondary | ICD-10-CM | POA: Diagnosis not present

## 2020-02-07 DIAGNOSIS — E538 Deficiency of other specified B group vitamins: Secondary | ICD-10-CM | POA: Diagnosis not present

## 2020-02-07 MED ORDER — CYANOCOBALAMIN 1000 MCG/ML IJ SOLN
1000.0000 ug | Freq: Once | INTRAMUSCULAR | Status: AC
  Administered 2020-02-07: 1000 ug via INTRAMUSCULAR

## 2020-02-07 NOTE — Addendum Note (Signed)
Addended by: Karma Ganja on: 02/07/2020 12:58 PM   Modules accepted: Orders

## 2020-02-07 NOTE — Progress Notes (Signed)
Grace Alvarado is a 86 y.o. female with the following history as recorded in EpicCare:  Patient Active Problem List   Diagnosis Date Noted  . Neuropathy 01/24/2020  . Chronic back pain 01/24/2020  . Complaints of leg weakness 01/24/2020  . Compression fracture of T11 vertebra ([place]) 01/23/2020  . Leg pain 01/03/2020  . Chronic anticoagulation 01/03/2020  . Compression fracture of L1 lumbar vertebra ([place]) 07/27/2019  . Low back pain 07/20/2019  . Hyperglycemia 05/01/2019  . Neck pain, musculoskeletal 11/16/2018  . Constipation 11/16/2018  . Hypokalemia   . AVM (arteriovenous malformation) of colon   . Lower GI bleed 11/06/2018  . Iron deficiency anemia 11/02/2018  . Lymphedema of left lower extremity 10/22/2018  . B12 deficiency 05/27/2018  . Dependent edema 05/25/2018  . Pulmonary hypertension, primary ([place]) 05/20/2018  . Hyperlipidemia 05/20/2018  . CHF (congestive heart failure) ([place]) 05/20/2018  . Tricuspid regurgitation 05/17/2018  . Mitral regurgitation 05/17/2018  . Chronic a-fib 05/17/2018  . Essential hypertension 05/17/2018  . Hypothyroid 05/17/2018  . CAD (coronary artery disease) 05/17/2018  . Carotid atherosclerosis, bilateral 05/17/2018    Current Outpatient Medications  Medication Sig Dispense Refill  . cholecalciferol (VITAMIN D3) 25 MCG (1000 UT) tablet Take 1,000 Units by mouth daily.    Marland Kitchen diltiazem (CARDIZEM SR) 120 MG 12 hr capsule Take 1 capsule (120 mg total) by mouth 2 (two) times daily. 90 capsule 3  . ferrous gluconate (FERGON) 324 MG tablet Take 2 tablets (648 mg total) by mouth every Monday, Wednesday, and Friday. 90 tablet 0  . furosemide (LASIX) 20 MG tablet TAKE 40 MG EVERY DAY KEEP UPCOMING APPT FOR FUTURE REFILLS ./CY 60 tablet 10  . levothyroxine (SYNTHROID) 75 MCG tablet TAKE 1 TABLET (75 MCG TOTAL) BY MOUTH DAILY BEFORE BREAKFAST. 90 tablet 1  . Multiple Vitamin (VITAMIN E/FOLIC TGGY/I-9/S-85 PO) Take 1 tablet by mouth daily.    . potassium  chloride (KLOR-CON) 10 MEQ tablet TAKE 1 TABLET (10 MEQ) TWICE DAILY. 180 tablet 0  . pravastatin (PRAVACHOL) 10 MG tablet Take 1 tablet (10 mg total) by mouth daily. 90 tablet 3  . Probiotic Product (PRO-BIOTIC BLEND PO) Take 1 capsule by mouth daily.    . vitamin C (ASCORBIC ACID) 500 MG tablet Take 500 mg by mouth daily.    . Vitamin D, Ergocalciferol, (DRISDOL) 1.25 MG (50000 UNIT) CAPS capsule Take 1 capsule (50,000 Units total) by mouth every 7 (seven) days. 8 capsule 0  . XARELTO 20 MG TABS tablet TAKE 1 TABLET (20 MG TOTAL) BY MOUTH DAILY WITH SUPPER 90 tablet 1   No current facility-administered medications for this visit.    Allergies: Codeine, Penicillins, Vancomycin, Zithromax [azithromycin], and Latex  Past Medical History:  Diagnosis Date  . Atrial fibrillation ([place])   . Hyperlipidemia   . Hypertension   . Hypothyroidism   . Mitral regurgitation   . MVP (mitral valve prolapse)     Past Surgical History:  Procedure Laterality Date  . APPENDECTOMY    . c section    . CESAREAN SECTION    . CHOLECYSTECTOMY    . COLONOSCOPY WITH PROPOFOL N/A 11/09/2018   Procedure: COLONOSCOPY WITH PROPOFOL;  Surgeon: Doran Stabler, MD;  Location: [place];  Service: Gastroenterology;  Laterality: N/A;  . ESOPHAGOGASTRODUODENOSCOPY (EGD) WITH PROPOFOL N/A 11/09/2018   Procedure: ESOPHAGOGASTRODUODENOSCOPY (EGD) WITH PROPOFOL;  Surgeon: Doran Stabler, MD;  Location: [place];  Service: Gastroenterology;  Laterality: N/A;  . HOT HEMOSTASIS N/A  11/09/2018   Procedure: HOT HEMOSTASIS (ARGON PLASMA COAGULATION/BICAP);  Surgeon: Sherrilyn Rist, MD;  Location: [facility] ENDOSCOPY;  Service: Gastroenterology;  Laterality: N/A;  . SUBMUCOSAL INJECTION  11/09/2018   Procedure: SUBMUCOSAL INJECTION;  Surgeon: Sherrilyn Rist, MD;  Location: [facility] ENDOSCOPY;  Service: Gastroenterology;;    Family History  Problem Relation Age of Onset  . Cancer Mother   . Dementia Mother     Social History    Tobacco Use  . Smoking status: Former Games developer  . Smokeless tobacco: Never Used  Substance Use Topics  . Alcohol use: Not Currently    Subjective:  Patient is accompanied by daughter today. Needs her B12 today;  Having pain/ swelling in her right hand secondary to recent fall; notes she tripped while carrying a stack of papers across the floor and her right hand hit the side of the door jam; does have small cut on her right hand- has been using topical Neosporin and band aid;   Objective:  Vitals:   02/07/20 1221  BP: 140/70  Pulse: 82  Temp: 98.1 F (36.7 C)  TempSrc: Oral  SpO2: 97%  Weight: 132 lb (59.9 kg)  Height: 5\' 4"  (1.626 m)    General: Well developed, well nourished, in no acute distress  Skin : Warm and dry. Small laceration noted on lateral side of right hand Head: Normocephalic and atraumatic  Lungs: Respirations unlabored; clear to auscultation bilaterally without wheeze, rales, rhonchi  Musculoskeletal: No deformities; marked swelling/ bruising noted over right hand Extremities: No edema, cyanosis, clubbing  Vessels: Symmetric bilaterally  Neurologic: Alert and oriented; speech intact; face symmetrical; moves all extremities well; CNII-XII intact without focal deficit   Assessment:  1. Right hand pain     Plan:  Concern for possible fracture; update X-ray today; may need to refer to hand specialist; encouraged to keep laceration covered with topical cream/ band-aid; follow-up to be determined.  B12 shot updated today as requested.  This visit occurred during the SARS-CoV-2 public health emergency.  Safety protocols were in place, including screening questions prior to the visit, additional usage of staff PPE, and extensive cleaning of exam room while observing appropriate contact time as indicated for disinfecting solutions.    No follow-ups on file.  Orders Placed This Encounter  Procedures  . DG Hand Complete Right    Standing Status:   Future     Standing Expiration Date:   04/08/2021    Order Specific Question:   Reason for Exam (SYMPTOM  OR DIAGNOSIS REQUIRED)    Answer:   right hand pain/ swelling    Order Specific Question:   Preferred imaging location?    Answer:   06/09/2021    Order Specific Question:   Radiology Contrast Protocol - do NOT remove file path    Answer:   [email or web address]    Requested Prescriptions    No prescriptions requested or ordered in this encounter

## 2020-02-07 NOTE — Progress Notes (Signed)
Reviewed with daughter; no acute fracture seen.

## 2020-02-09 ENCOUNTER — Ambulatory Visit: Payer: Medicare Other

## 2020-02-20 ENCOUNTER — Other Ambulatory Visit: Payer: Self-pay

## 2020-02-20 ENCOUNTER — Encounter: Payer: Self-pay | Admitting: Family Medicine

## 2020-02-20 ENCOUNTER — Ambulatory Visit: Payer: Medicare Other | Admitting: Family Medicine

## 2020-02-20 VITALS — BP 124/66 | HR 101 | Ht 64.0 in | Wt 135.0 lb

## 2020-02-20 DIAGNOSIS — M549 Dorsalgia, unspecified: Secondary | ICD-10-CM

## 2020-02-20 DIAGNOSIS — S32010D Wedge compression fracture of first lumbar vertebra, subsequent encounter for fracture with routine healing: Secondary | ICD-10-CM | POA: Diagnosis not present

## 2020-02-20 DIAGNOSIS — R2689 Other abnormalities of gait and mobility: Secondary | ICD-10-CM

## 2020-02-20 DIAGNOSIS — K5520 Angiodysplasia of colon without hemorrhage: Secondary | ICD-10-CM | POA: Diagnosis not present

## 2020-02-20 DIAGNOSIS — G8929 Other chronic pain: Secondary | ICD-10-CM

## 2020-02-20 NOTE — Progress Notes (Signed)
[name] [address] [place] [address] Phone: [phone] Subjective:   Grace Alvarado, am serving as a scribe for Dr. Antoine Primas. This visit occurred during the SARS-CoV-2 public health emergency.  Safety protocols were in place, including screening questions prior to the visit, additional usage of staff PPE, and extensive cleaning of exam room while observing appropriate contact time as indicated for disinfecting solutions.   I'm seeing this patient by the request  of:  Burns, Bobette Mo, MD  CC: Multiple complaints  XAJ:[id number]  Grace Alvarado is a 86 y.o. female coming in with complaint of back pain. Last seen in November 2020 for compression fracture of L5 vertebrae. Patient states that she is having left sided low back pain. Does feel back pain has changed since last visit. Also having leg weakness. Using lidocaine patch. Denies any radiating symptoms.   Is also having pain beneath left breast over anterior ribs.  Patient states it comes and goes.  Cannot tell if it is with or without activity.  Sometimes can hurt her at night and sometimes can hurt her with sitting.    Past Medical History:  Diagnosis Date  . Atrial fibrillation ([facility])   . Hyperlipidemia   . Hypertension   . Hypothyroidism   . Mitral regurgitation   . MVP (mitral valve prolapse)    Past Surgical History:  Procedure Laterality Date  . APPENDECTOMY    . c section    . CESAREAN SECTION    . CHOLECYSTECTOMY    . COLONOSCOPY WITH PROPOFOL N/A 11/09/2018   Procedure: COLONOSCOPY WITH PROPOFOL;  Surgeon: Sherrilyn Rist, MD;  Location: [facility] ENDOSCOPY;  Service: Gastroenterology;  Laterality: N/A;  . ESOPHAGOGASTRODUODENOSCOPY (EGD) WITH PROPOFOL N/A 11/09/2018   Procedure: ESOPHAGOGASTRODUODENOSCOPY (EGD) WITH PROPOFOL;  Surgeon: Sherrilyn Rist, MD;  Location: [facility] ENDOSCOPY;  Service: Gastroenterology;  Laterality: N/A;  . HOT HEMOSTASIS N/A 11/09/2018   Procedure: HOT  HEMOSTASIS (ARGON PLASMA COAGULATION/BICAP);  Surgeon: Sherrilyn Rist, MD;  Location: [facility] ENDOSCOPY;  Service: Gastroenterology;  Laterality: N/A;  . SUBMUCOSAL INJECTION  11/09/2018   Procedure: SUBMUCOSAL INJECTION;  Surgeon: Sherrilyn Rist, MD;  Location: [facility] ENDOSCOPY;  Service: Gastroenterology;;   Social History   Socioeconomic History  . Marital status: Widowed    Spouse name: Not on file  . Number of children: 3  . Years of education: Not on file  . Highest education level: Not on file  Occupational History  . Occupation: retired  Tobacco Use  . Smoking status: Former Games developer  . Smokeless tobacco: Never Used  Substance and Sexual Activity  . Alcohol use: Not Currently  . Drug use: Never  . Sexual activity: Not Currently  Other Topics Concern  . Not on file  Social History Narrative  . Not on file   Social Determinants of Health   Financial Resource Strain:   . Difficulty of Paying Living Expenses:   Food Insecurity:   . Worried About Programme researcher, broadcasting/film/video in the Last Year:   . Barista in the Last Year:   Transportation Needs:   . Freight forwarder (Medical):   Marland Kitchen Lack of Transportation (Non-Medical):   Physical Activity:   . Days of Exercise per Week:   . Minutes of Exercise per Session:   Stress:   . Feeling of Stress :   Social Connections:   . Frequency of Communication with Friends and Family:   .  Frequency of Social Gatherings with Friends and Family:   . Attends Religious Services:   . Active Member of Clubs or Organizations:   . Attends Banker Meetings:   Marland Kitchen Marital Status:    Allergies  Allergen Reactions  . Codeine Other (See Comments)    unknown  . Penicillins Other (See Comments)    unknown  . Vancomycin Other (See Comments)    unknown  . Zithromax [Azithromycin] Other (See Comments)    unknown  . Latex Rash   Family History  Problem Relation Age of Onset  . Cancer Mother   . Dementia Mother     Current  Outpatient Medications (Endocrine & Metabolic):  .  levothyroxine (SYNTHROID) 75 MCG tablet, TAKE 1 TABLET (75 MCG TOTAL) BY MOUTH DAILY BEFORE BREAKFAST.  Current Outpatient Medications (Cardiovascular):  .  diltiazem (CARDIZEM SR) 120 MG 12 hr capsule, Take 1 capsule (120 mg total) by mouth 2 (two) times daily. .  furosemide (LASIX) 20 MG tablet, TAKE 40 MG EVERY DAY KEEP UPCOMING APPT FOR FUTURE REFILLS ./CY .  pravastatin (PRAVACHOL) 10 MG tablet, Take 1 tablet (10 mg total) by mouth daily.    Current Outpatient Medications (Hematological):  .  ferrous gluconate (FERGON) 324 MG tablet, Take 2 tablets (648 mg total) by mouth every Monday, Wednesday, and Friday. Carlena Hurl 20 MG TABS tablet, TAKE 1 TABLET (20 MG TOTAL) BY MOUTH DAILY WITH SUPPER  Current Outpatient Medications (Other):  .  cholecalciferol (VITAMIN D3) 25 MCG (1000 UT) tablet, Take 1,000 Units by mouth daily. .  Multiple Vitamin (VITAMIN E/FOLIC ACID/B-6/B-12 PO), Take 1 tablet by mouth daily. .  potassium chloride (KLOR-CON) 10 MEQ tablet, TAKE 1 TABLET (10 MEQ) TWICE DAILY. .  Probiotic Product (PRO-BIOTIC BLEND PO), Take 1 capsule by mouth daily. .  vitamin C (ASCORBIC ACID) 500 MG tablet, Take 500 mg by mouth daily. .  Vitamin D, Ergocalciferol, (DRISDOL) 1.25 MG (50000 UNIT) CAPS capsule, Take 1 capsule (50,000 Units total) by mouth every 7 (seven) days.   Reviewed prior external information including notes and imaging from  primary care provider As well as notes that were available from care everywhere and other healthcare systems.  Past medical history, social, surgical and family history all reviewed in electronic medical record.  No pertanent information unless stated regarding to the chief complaint.   Review of Systems:  No headache, visual changes, nausea, vomiting, diarrhea, constipation, dizziness, abdominal pain, skin rash, fevers, chills, night sweats, weight loss, swollen lymph nodes, body aches, joint  swelling, chest pain, shortness of breath, mood changes. POSITIVE muscle aches  Objective  Blood pressure 124/66, pulse (!) 101, height 5\' 4"  (1.626 m), weight 135 lb (61.2 kg), SpO2 97 %.   General: No apparent distress alert and oriented x3 mood and affect normal, dressed appropriately.  HEENT: Pupils equal, extraocular movements intact  Respiratory: Patient's speak in full sentences and does not appear short of breath  Cardiovascular: 1+ lower extremity edema, non tender, no erythema  Gait antalgic uses a walker Anterior abdomen tender to palpation with voluntary guarding noted in the upper left quadrant.  Patient does have what appears to be possible constipation in the area.  Patient's rib is minimally tender but no crepitus noted.  Patient with back severe tenderness to even light palpation.  Seems to be out of proportion to the amount of palpation.  Patient does have severe degenerative scoliotic changes noted.  Patient does have peripheral neuropathy of the lower  extremities bilaterally that is symmetric    Impression and Recommendations:     This case required medical decision making of moderate complexity. The above documentation has been reviewed and is accurate and complete Lyndal Pulley, DO       Note: This dictation was prepared with Dragon dictation along with smaller phrase technology. Any transcriptional errors that result from this process are unintentional.

## 2020-02-20 NOTE — Assessment & Plan Note (Signed)
Chronic back pain with history of compression fractures.  Patient continues to have pain.  Encouraged her to continue the once weekly vitamin D almost indefinitely.  Discussed the iron supplementation.  Due to patient's age and comorbidities very concerned to start other medications at this time.  Patient has not responded well to certain 1 such as gabapentin in the past.  Patient given a trial of topical anti-inflammatories but she is concerned about this.  We discussed that patient's abdominal pain could be secondary to more of a constipation and we discussed MiraLAX and Colace.  History of an AVM of the colon and encouraged her to consider potentially working up with the gastroenterologist.  Patient will follow up with me again in 4 to 8 weeks.  Total time with patient with reviewing the chart as well as discussing with her and her daughter greater than 31 minutes

## 2020-02-20 NOTE — Patient Instructions (Addendum)
[facility] Miralax 17g daily with Colace 100mg  daily for one week A one to one mixture of white vinegar and alcohol to the ear once daily Watch for rash in rib area Try Pennsaid on back when in severe pain See me in 8 weeks

## 2020-02-22 ENCOUNTER — Encounter: Payer: Self-pay | Admitting: Family Medicine

## 2020-02-22 ENCOUNTER — Emergency Department (HOSPITAL_COMMUNITY): Payer: Medicare Other

## 2020-02-22 ENCOUNTER — Other Ambulatory Visit: Payer: Self-pay

## 2020-02-22 ENCOUNTER — Encounter (HOSPITAL_COMMUNITY): Payer: Self-pay

## 2020-02-22 ENCOUNTER — Emergency Department (HOSPITAL_COMMUNITY)
Admission: EM | Admit: 2020-02-22 | Discharge: 2020-02-23 | Disposition: A | Discharge status: Home / Self Care | Payer: Medicare Other | Attending: Emergency Medicine | Admitting: Emergency Medicine

## 2020-02-22 DIAGNOSIS — E039 Hypothyroidism, unspecified: Secondary | ICD-10-CM | POA: Insufficient documentation

## 2020-02-22 DIAGNOSIS — W1830XA Fall on same level, unspecified, initial encounter: Secondary | ICD-10-CM | POA: Insufficient documentation

## 2020-02-22 DIAGNOSIS — I4891 Unspecified atrial fibrillation: Secondary | ICD-10-CM | POA: Diagnosis not present

## 2020-02-22 DIAGNOSIS — Y939 Activity, unspecified: Secondary | ICD-10-CM | POA: Diagnosis not present

## 2020-02-22 DIAGNOSIS — I10 Essential (primary) hypertension: Secondary | ICD-10-CM | POA: Insufficient documentation

## 2020-02-22 DIAGNOSIS — S8001XA Contusion of right knee, initial encounter: Secondary | ICD-10-CM | POA: Insufficient documentation

## 2020-02-22 DIAGNOSIS — Y92191 Dining room in other specified residential institution as the place of occurrence of the external cause: Secondary | ICD-10-CM | POA: Insufficient documentation

## 2020-02-22 DIAGNOSIS — M25562 Pain in left knee: Secondary | ICD-10-CM

## 2020-02-22 DIAGNOSIS — T148XXA Other injury of unspecified body region, initial encounter: Secondary | ICD-10-CM

## 2020-02-22 DIAGNOSIS — Z23 Encounter for immunization: Secondary | ICD-10-CM | POA: Insufficient documentation

## 2020-02-22 DIAGNOSIS — Y999 Unspecified external cause status: Secondary | ICD-10-CM | POA: Insufficient documentation

## 2020-02-22 DIAGNOSIS — S8991XA Unspecified injury of right lower leg, initial encounter: Secondary | ICD-10-CM | POA: Diagnosis present

## 2020-02-22 MED ORDER — TETANUS-DIPHTH-ACELL PERTUSSIS 5-2.5-18.5 LF-MCG/0.5 IM SUSP
0.5000 mL | Freq: Once | INTRAMUSCULAR | Status: AC
  Administered 2020-02-22: 0.5 mL via INTRAMUSCULAR
  Filled 2020-02-22: qty 0.5

## 2020-02-22 NOTE — Discharge Instructions (Addendum)
You were evaluated in the Emergency Department and after careful evaluation, we did not find any emergent condition requiring admission or further testing in the hospital.  Your exam/testing today is overall reassuring.  Your x-ray did not show any broken bones.  You have some bleeding underneath the skin from the fall, likely made worse from your blood thinner.  We recommend compression and cold compresses for the next few days, use Tylenol for pain.  Please return to the Emergency Department if you experience any worsening of your condition.  We encourage you to follow up with a primary care provider.  Thank you for allowing [place] to be a part of your care.

## 2020-02-22 NOTE — ED Provider Notes (Addendum)
[facility] Emergency Department Provider Note MRN:  [id number]  Arrival date & time: 02/22/20     Chief Complaint   Fall and Knee Pain   History of Present Illness   Grace Alvarado is a 86 y.o. year-old female with a history of hypertension, hyperlipidemia, A. fib presenting to the ED with chief complaint of fall and knee pain.  Patient explains that she was doing a puzzle on her dining room table and decided to move the positioning of the table.  It slipped when she was pushing it and she lost her balance and fell forward onto her knees.  Denies any head trauma, no loss of consciousness, no neck or back pain, no chest pain or shortness of breath, no abdominal pain, endorsing pain and swelling to the left knee, no other injuries or complaints.  Pain is moderate in severity, constant, worse with motion or palpation.  Review of Systems  A complete 10 system review of systems was obtained and all systems are negative except as noted in the HPI and PMH.   Patient's Health History    Past Medical History:  Diagnosis Date  . Atrial fibrillation ([place])   . Hyperlipidemia   . Hypertension   . Hypothyroidism   . Mitral regurgitation   . MVP (mitral valve prolapse)     Past Surgical History:  Procedure Laterality Date  . APPENDECTOMY    . c section    . CESAREAN SECTION    . CHOLECYSTECTOMY    . COLONOSCOPY WITH PROPOFOL N/A 11/09/2018   Procedure: COLONOSCOPY WITH PROPOFOL;  Surgeon: Doran Stabler, MD;  Location: [place];  Service: Gastroenterology;  Laterality: N/A;  . ESOPHAGOGASTRODUODENOSCOPY (EGD) WITH PROPOFOL N/A 11/09/2018   Procedure: ESOPHAGOGASTRODUODENOSCOPY (EGD) WITH PROPOFOL;  Surgeon: Doran Stabler, MD;  Location: [place];  Service: Gastroenterology;  Laterality: N/A;  . HOT HEMOSTASIS N/A 11/09/2018   Procedure: HOT HEMOSTASIS (ARGON PLASMA COAGULATION/BICAP);  Surgeon: Doran Stabler, MD;  Location: [place];  Service:  Gastroenterology;  Laterality: N/A;  . SUBMUCOSAL INJECTION  11/09/2018   Procedure: SUBMUCOSAL INJECTION;  Surgeon: Doran Stabler, MD;  Location[facility] ENDOSCOPY;  Service: Gastroenterology;;    Family History  Problem Relation Age of Onset  . Cancer Mother   . Dementia Mother     Social History   Socioeconomic History  . Marital status: Widowed    Spouse name: Not on file  . Number of children: 3  . Years of education: Not on file  . Highest education level: Not on file  Occupational History  . Occupation: retired  Tobacco Use  . Smoking status: Former Research scientist (life sciences)  . Smokeless tobacco: Never Used  Substance and Sexual Activity  . Alcohol use: Not Currently  . Drug use: Never  . Sexual activity: Not Currently  Other Topics Concern  . Not on file  Social History Narrative  . Not on file   Social Determinants of Health   Financial Resource Strain:   . Difficulty of Paying Living Expenses:   Food Insecurity:   . Worried About Charity fundraiser in the Last Year:   . Arboriculturist in the Last Year:   Transportation Needs:   . Film/video editor (Medical):   Marland Kitchen Lack of Transportation (Non-Medical):   Physical Activity:   . Days of Exercise per Week:   . Minutes of Exercise per Session:   Stress:   . Feeling of Stress :  Social Connections:   . Frequency of Communication with Friends and Family:   . Frequency of Social Gatherings with Friends and Family:   . Attends Religious Services:   . Active Member of Clubs or Organizations:   . Attends Banker Meetings:   Marland Kitchen Marital Status:   Intimate Partner Violence:   . Fear of Current or Ex-Partner:   . Emotionally Abused:   Marland Kitchen Physically Abused:   . Sexually Abused:      Physical Exam   Vitals:   02/22/20 2156  BP: (!) 136/91  Pulse: (!) 106  Resp: 16  Temp: 98 F (36.7 C)  SpO2: 98%    CONSTITUTIONAL: Well-appearing, NAD NEURO:  Alert and oriented x 3, no focal deficits EYES:  eyes equal and  reactive ENT/NECK:  no LAD, no JVD CARDIO: Regular rate, well-perfused, normal S1 and S2 PULM:  CTAB no wheezing or rhonchi GI/GU:  normal bowel sounds, non-distended, non-tender MSK/SPINE: Swelling and bruising to the left knee, neurovascularly intact distally SKIN:  no rash, atraumatic PSYCH:  Appropriate speech and behavior  *Additional and/or pertinent findings included in MDM below  Diagnostic and Interventional Summary    EKG Interpretation  Date/Time:    Ventricular Rate:    PR Interval:    QRS Duration:   QT Interval:    QTC Calculation:   R Axis:     Text Interpretation:        Labs Reviewed - No data to display  DG Knee Complete 4 Views Left  Final Result    DG Knee Complete 4 Views Right  Final Result      Medications  Tdap (BOOSTRIX) injection 0.5 mL (has no administration in time range)     Procedures  /  Critical Care Procedures  ED Course and Medical Decision Making  I have reviewed the triage vital signs, the nursing notes, and pertinent available records from the EMR.  Listed above are laboratory and imaging tests that I personally ordered, reviewed, and interpreted and then considered in my medical decision making (see below for details).      Knee trauma in the setting of anticoagulation, plain film is without acute fracture, there is bruising and hematoma evident on exam but compartments are soft, neurovascularly intact, largely preserved range of motion of the knee, strong DP pulse distally.  Appropriate for discharge with cold compresses and compression with Ace wrap.  May be small component of hemarthrosis but I suspect this is mostly outside of the knee joint itself.  No laxity noted.    Elmer Sow. Pilar Plate, MD [facility] Emergency Medicine [facility] [email or web address]  Final Clinical Impressions(s) / ED Diagnoses     ICD-10-CM   1. Acute pain of left knee  M25.562   2. Hematoma  [email or web address]     ED Discharge Orders      None       Discharge Instructions Discussed with and Provided to Patient:     Discharge Instructions     You were evaluated in the Emergency Department and after careful evaluation, we did not find any emergent condition requiring admission or further testing in the hospital.  Your exam/testing today is overall reassuring.  Your x-ray did not show any broken bones.  You have some bleeding underneath the skin from the fall, likely made worse from your blood thinner.  We recommend compression and cold compresses for the next few days, use Tylenol for pain.  Please return to the Emergency  Department if you experience any worsening of your condition.  We encourage you to follow up with a primary care provider.  Thank you for allowing [place] to be a part of your care.      Sabas Sous, MD 02/22/20 3825    Sabas Sous, MD 02/22/20 2320

## 2020-02-22 NOTE — ED Notes (Signed)
Daughter Grace Alvarado has been updated that pt is having xray and will be in H20.  Please call with update when we know plan of care.

## 2020-02-22 NOTE — ED Triage Notes (Signed)
Pt arrive POV for eval of L sided knee pain after a mechanical fall this afternoon. Pt is anticoagulated on xarelto for afib. Sig swelling to L knee, minimal swelling to R. Pt denies N/T, +DP pulses blt. L foot is slightly cooler than right, but easily palpable pulses.

## 2020-02-24 ENCOUNTER — Ambulatory Visit: Payer: Medicare Other | Admitting: Family Medicine

## 2020-02-24 ENCOUNTER — Ambulatory Visit (INDEPENDENT_AMBULATORY_CARE_PROVIDER_SITE_OTHER): Payer: Medicare Other

## 2020-02-24 ENCOUNTER — Encounter: Payer: Self-pay | Admitting: Family Medicine

## 2020-02-24 ENCOUNTER — Other Ambulatory Visit: Payer: Self-pay

## 2020-02-24 VITALS — BP 120/60 | HR 107 | Ht 64.0 in | Wt 135.0 lb

## 2020-02-24 DIAGNOSIS — M7042 Prepatellar bursitis, left knee: Secondary | ICD-10-CM

## 2020-02-24 DIAGNOSIS — M25562 Pain in left knee: Secondary | ICD-10-CM

## 2020-02-24 MED ORDER — DOXYCYCLINE HYCLATE 100 MG PO TABS: 100.0000 mg | ORAL_TABLET | Freq: Two times a day (BID) | ORAL | 0 refills | Status: DC

## 2020-02-24 NOTE — Progress Notes (Signed)
Rito Ehrlich, am serving as a Education administrator for Dr. Lynne Leader.  Grace Alvarado is a 86 y.o. female who presents to [place] at [facility] today for L knee pain.  She fell on her knees on 02/22/20 when trying to move a Puzzle from a table and was seen at the [facility] ED that day.  Since then, pt reports con't knee pain and swelling and it is very painful it is getting worse. Is taking tylenol every four hours. In the past fell on R knee and had the same thing happen but got cellulitis and they want to prevent that from happening again.   Diagnostic imaging: R and L knee XR- 02/22/20  Pertinent review of systems: No fevers or chills  Relevant historical information: Anticoagulation due to atrial fibrillation.  History similar prepatellar bursitis hematoma right knee ultimately became infected and required antibiotics   Exam:  BP 120/60 (BP Location: Left Arm, Patient Position: Sitting, Cuff Size: Normal)   Pulse (!) 107   Ht 5\' 4"  (1.626 m)   Wt 135 lb (61.2 kg)   LMP  (LMP Unknown)   SpO2 98%   BMI 23.17 kg/m  General: Well Developed, well nourished, and in no acute distress.   MSK: Left knee large swollen bruised area anterior left knee.  Bruising present distally down the leg. Range of motion 5-100 degrees. Mildly tender palpation anterior knee. No erythema or induration anterior knee.    Lab and Radiology Results No results found for this or any previous visit (from the past 72 hour(s)). DG Knee Complete 4 Views Left  Result Date: 02/22/2020 CLINICAL DATA:  Pain status post fall EXAM: LEFT KNEE - COMPLETE 4+ VIEW COMPARISON:  None. FINDINGS: There is soft tissue swelling about the knee. There is no acute displaced fracture or dislocation. No definite joint effusion. Mild tricompartmental degenerative changes are noted. IMPRESSION: 1. No acute displaced fracture. 2. There is extensive soft tissue swelling about the knee. Electronically Signed   By:  Constance Holster M.D.   On: 02/22/2020 22:48   DG Knee Complete 4 Views Right  Result Date: 02/22/2020 CLINICAL DATA:  Pain status post fall EXAM: RIGHT KNEE - COMPLETE 4+ VIEW COMPARISON:  None. FINDINGS: No evidence of fracture, dislocation, or joint effusion. No evidence of arthropathy or other focal bone abnormality. Soft tissues are unremarkable. IMPRESSION: Negative. Electronically Signed   By: Constance Holster M.D.   On: 02/22/2020 22:49   Diagnostic Limited MSK Ultrasound of: Left knee Quad tendon is intact.  Small joint effusion present superior patellar space. Patellar tendon is intact. Very large hematoma/seroma anterior to patellar tendon consistent with traumatic prepatellar bursitis Impression: Left knee traumatic prepatellar bursitis    Assessment and Plan: 86 y.o. female with left knee traumatic prepatellar bursitis.  Large hematoma likely due to the anticoagulation.  Fortunately no sign of infection today.  Plan for watchful waiting and recheck in about a week.  May consider aspiration attempt if the hematoma appears to be more consistent with seroma at that point. I did prescribe doxycycline.  I do not think that her knee is currently infected but she is at significant risk for developing cellulitis and subsequently septic bursitis.  Prescribe doxycycline to take if she develops redness or pain.   PDMP not reviewed this encounter. Orders Placed This Encounter  Procedures  . [place] LIMITED JOINT SPACE STRUCTURES LOW LEFT    Standing Status:   Future    Number of Occurrences:  1    Standing Expiration Date:   02/23/2021    Order Specific Question:   Reason for Exam (SYMPTOM  OR DIAGNOSIS REQUIRED)    Answer:   Left knee pian    Order Specific Question:   Preferred imaging location?    Answer:   [facility] ordered this encounter  Medications  . doxycycline (VIBRA-TABS) 100 MG tablet    Sig: Take 1 tablet (100 mg total) by mouth 2 (two)  times daily for 7 days.    Dispense:  14 tablet    Refill:  0     Discussed warning signs or symptoms. Please see discharge instructions. Patient expresses understanding.   The above documentation has been reviewed and is accurate and complete Clementeen Graham, M.D.

## 2020-02-24 NOTE — Patient Instructions (Addendum)
Thank you for coming in today. Try the pennsiad or voltaren gel on this for pain.  Recheck in about 1 week or so. I am prescribing antibiotics to take if you start getting redness and worsening pain indicating infection.  Let me know if you start it.   Take the doxycycline antibiotic if needed.  Do not take it with calcium or iron. Ok to take with a little food but avoid dairy.    Prepatellar Bursitis  Prepatellar bursitis is inflammation of the prepatellar bursa, which is a fluid-filled sac that cushions the kneecap (patella). Prepatellar bursitis happens when fluid builds up in this sac and causes it to swell. The condition causes knee pain. What are the causes? This condition may be caused by:  Constant pressure on the knees from kneeling.  A hit to the knee.  Falling on the knee.  Infection from bacteria.  Moving the knee often in a forceful way. What increases the risk? You are more likely to develop this condition if:  You play sports that have a high risk of falling on the knee or being hit on the knee. These include football, wrestling, basketball, or soccer.  You do work in which you kneel for long periods of time, such as roofing, plumbing, or gardening.  You have another inflammatory condition, such as gout or rheumatoid arthritis. What are the signs or symptoms? The most common symptom of this condition is knee pain that gets better with rest. Other symptoms include:  Swelling on the front of the kneecap.  Warmth in the knee.  Tenderness with activity.  Redness in the knee.  Inability to bend the knee or to kneel. How is this diagnosed? This condition is diagnosed based on:  A physical exam. Your health care provider will compare your knees and check for tenderness and pain while moving your knee.  Your medical history.  Tests to check for infection. These may include blood tests and tests on the fluid in the bursa.  Imaging tests, such as X-ray, MRI,  or ultrasound, to check for damage in the patella, or fluid buildup and swelling in the bursa. How is this treated? This condition may be treated by:  Resting the knee.  Putting ice on the knee.  Taking medicines, such as: ? NSAIDs. These can help to reduce pain and swelling. ? Antibiotics. These may be needed if you have an infection. ? Steroids. These are used to reduce swelling and inflammation, and may be prescribed if other treatments are not helping.  Raising (elevating) the knee while resting.  Doing exercises to help you maintain movement (physical therapy). These may be recommended after pain and swelling improve.  Having a procedure to remove fluid from the bursa. This may be done if other treatments are not helping.  Having surgery to remove the bursa. This may be done if you have a severe infection or if the condition keeps coming back after treatment. Follow these instructions at home: Medicines  Take over-the-counter and prescription medicines only as told by your health care provider.  If you were prescribed an antibiotic medicine, take it as told by your health care provider. Do not stop taking the antibiotic even if you start to feel better. Managing pain, stiffness, and swelling   If directed, put ice on the injured area. ? Put ice in a plastic bag. ? Place a towel between your skin and the bag. ? Leave the ice on for 20 minutes, 2-3 times a day.  Elevate the injured area above the level of your heart while you are sitting or lying down. Activity  Do not use the injured limb to support your body weight until your health care provider says that you can.  Rest your knee.  Avoid activities that cause knee pain.  Return to your normal activities as told by your health care provider. Ask your health care provider what activities are safe for you.  Do exercises as told by your health care provider. General instructions  Ask your health care provider when it  is safe for you to drive.  Do not use any products that contain nicotine or tobacco, such as cigarettes, e-cigarettes, and chewing tobacco. These can delay healing. If you need help quitting, ask your health care provider.  Keep all follow-up visits as told by your health care provider. This is important. How is this prevented?  Warm up and stretch before being active.  Cool down and stretch after being active.  Give your body time to rest between periods of activity.  Maintain physical fitness, including strength and flexibility.  Be safe and responsible while being active. This will help you to avoid falls.  Wear knee pads if you have to kneel for a long period of time. Contact a health care provider if:  Your symptoms do not improve or get worse.  Your symptoms keep coming back after treatment.  You develop a fever and have warmth, redness, or swelling over your knee. Summary  Prepatellar bursitis is inflammation of the prepatellar bursa, which is a fluid-filled sac that cushions the kneecap (patella).  This condition may be caused by injury or constant pressure on the knee. It may also be caused by an infection from bacteria.  Symptoms of this condition include pain, swelling, warmth, and tenderness in the knee.  Follow instructions from your health care provider about taking medicines, resting, and doing activities.  Contact your health care provider if your symptoms do not improve, get worse, or keep coming back after treatment. This information is not intended to replace advice given to you by your health care provider. Make sure you discuss any questions you have with your health care provider. Document Revised: 01/14/2019 Document Reviewed: 12/02/2018 Elsevier Patient Education  2020 Elsevier Inc[employer]   Seroma A seroma is a collection of fluid on the body that looks like swelling or a mass. Seromas form where tissue has been injured or cut. Seromas vary in size. Some  are small and painless. Others may become large and cause pain or discomfort. Many seromas go away on their own as the fluid is naturally absorbed by the body, and some seromas need to be drained. What are the causes? Seromas form as the result of damage to tissue or the removal of tissue. This tissue damage may occur during surgery or because of an injury or trauma. When tissue is disrupted or removed, empty space is created. The body's natural defense system (immune system) causes fluid to enter the empty space and form a seroma. What are the signs or symptoms? Symptoms of this condition include:  Swelling at the site of a surgical cut (incision) or an injury.  Drainage of clear fluid at the surgery or injury site.  Discomfort or pain. How is this diagnosed? This condition is diagnosed based on your symptoms, your medical history, and a physical exam. During the exam, your health care provider will press on the seroma. You may also have tests, including:  Blood tests.  Imaging tests, such as an ultrasound or CT scan. How is this treated? Some seromas go away (resolve) on their own. Your health care provider may monitor you to make sure the seroma does not cause any complications. If your seroma does not resolve on its own, treatment may include:  Using a needle to drain the fluid from the seroma (needle aspiration).  Inserting a flexible tube (catheter) to drain the fluid.  Applying a bandage (dressing), such as an elastic bandage or binder.  Antibiotic medicines, if the seroma becomes infected. In rare cases, surgery may be done to remove the seroma and repair the area. Follow these instructions at home:   If you were prescribed an antibiotic medicine, take it as told by your health care provider. Do not stop taking the antibiotic even if you start to feel better.  Return to your normal activities as told by your health care provider. Ask your health care provider what activities  are safe for you.  Take over-the-counter and prescription medicines only as told by your health care provider.  Check your seroma every day for signs of infection. Check for: ? Redness or pain. ? Fluid or pus. ? More swelling. ? Warmth.  Keep all follow-up visits as told by your health care provider. This is important. Contact a health care provider if:  You have a fever.  You have redness or pain at the site of the seroma.  You have fluid or pus coming from the seroma.  Your seroma is more swollen or is getting bigger.  Your seroma is warm to the touch. This information is not intended to replace advice given to you by your health care provider. Make sure you discuss any questions you have with your health care provider. Document Revised: 09/04/2017 Document Reviewed: 07/04/2016 Elsevier Patient Education  2020 [employer].

## 2020-02-25 ENCOUNTER — Encounter: Payer: Self-pay | Admitting: Family Medicine

## 2020-02-28 ENCOUNTER — Encounter: Payer: Self-pay | Admitting: Family Medicine

## 2020-03-01 ENCOUNTER — Ambulatory Visit: Payer: Self-pay

## 2020-03-01 ENCOUNTER — Encounter: Payer: Self-pay | Admitting: Family Medicine

## 2020-03-01 ENCOUNTER — Other Ambulatory Visit: Payer: Self-pay

## 2020-03-01 ENCOUNTER — Ambulatory Visit: Payer: Medicare Other | Admitting: Family Medicine

## 2020-03-01 DIAGNOSIS — L03116 Cellulitis of left lower limb: Secondary | ICD-10-CM

## 2020-03-01 DIAGNOSIS — M7042 Prepatellar bursitis, left knee: Secondary | ICD-10-CM | POA: Diagnosis not present

## 2020-03-01 MED ORDER — CLINDAMYCIN HCL 300 MG PO CAPS: 300.0000 mg | ORAL_CAPSULE | Freq: Three times a day (TID) | ORAL | 0 refills | Status: DC

## 2020-03-01 NOTE — Progress Notes (Signed)
   I, Christoper Fabian, LAT, ATC, am serving as scribe for Dr. Clementeen Graham.  Dakisha Alvarado is a 86 y.o. female who presents to [facility] today for f/u of L knee pain/pre-patellar bursitis after falling and landing onher knees on 02/22/20.  She was last seen by Dr. Denyse Amass on 02/24/20 and was prescribed doxycycline if needed for infection.  Since her last visit, pt reports her pain and swelling have worsened.  She does not think the doxycycline has helped.  No fevers or chills.  Diagnostic testing: B knee XR- 02/22/20   Pertinent review of systems: No fevers chills nausea vomiting or diarrhea.  Relevant historical information: Atrial fibrillation   Exam:   General: Well Developed, well nourished, and in no acute distress.   MSK: Left knee significant bruising and swelling and erythema overlying anterior knee.  Tender to palpation.  Decreased knee range of motion.    Lab and Radiology Results  Incision and drainage left knee prepatellar bursitis cellulitis Consent obtained and timeout performed. Skin and overlying anterior knee cleaned with isopropyl alcohol. 5 mL of lidocaine without epinephrine injected achieving good anesthesia. Sharp incision was made into prepatellar bursa.  Incision was widened and turned into a square measuring less than 1 cm across. Serosanguineous fluid was drained freely. Blunt dissection was used to break up loculations to start removing some of the clotted blood. Unable to remove much of the clotted blood without causing significant pain. Sterile saline was used to irrigate the wound removing some more fluid and clotted blood. Culture obtained. Seen was applied.   Assessment and Plan: 86 y.o. female with left knee traumatic prepatellar bursitis now with overlying cellulitis and secondary infection.  Culture pending however patient is clearly worsening.  Plan to transition away from doxycycline to clindamycin.  Will check back  tomorrow morning.  If not improving likely will proceed with further evaluation in the emergency room with orthopedic evaluation.  Patient may benefit from general anesthesia to truly sedate and a surgical opportunity to evacuate the hematoma and as well as IV antibiotics.   PDMP reviewed during this encounter. Orders Placed This Encounter  Procedures  . Wound culture    Standing Status:   Future    Standing Expiration Date:   03/01/2021    Order Specific Question:   Source    Answer:   left knee infetion    Order Specific Question:   Release to patient    Answer:   Immediate  . [place] LIMITED JOINT SPACE STRUCTURES LOW LEFT(NO LINKED CHARGES)    Order Specific Question:   Reason for Exam (SYMPTOM  OR DIAGNOSIS REQUIRED)    Answer:   L knee pain    Order Specific Question:   Preferred imaging location?    Answer:   [facility] ordered this encounter  Medications  . clindamycin (CLEOCIN) 300 MG capsule    Sig: Take 1 capsule (300 mg total) by mouth 3 (three) times daily.    Dispense:  21 capsule    Refill:  0     Discussed warning signs or symptoms. Please see discharge instructions. Patient expresses understanding.   The above documentation has been reviewed and is accurate and complete Clementeen Graham, M.D.

## 2020-03-01 NOTE — Patient Instructions (Addendum)
Thank you for coming in today. Allow it drain.  Recheck tomorrow. Ok to double book either first appointment at 8 or last at noon  STOP doxycycline.  Start clindamycin three times daily.  If not improving I will talk with the on call orthopedic surgeon and we will plan for next steps.  I think this will improve over night.

## 2020-03-01 NOTE — Addendum Note (Signed)
Addended by: Waldemar Dickens B on: 03/01/2020 04:50 PM   Modules accepted: Orders

## 2020-03-02 ENCOUNTER — Ambulatory Visit: Payer: Medicare Other | Admitting: Family Medicine

## 2020-03-02 ENCOUNTER — Inpatient Hospital Stay (HOSPITAL_COMMUNITY)
Admission: EM | Admit: 2020-03-02 | Discharge: 2020-03-08 | DRG: 605 | Disposition: A | Discharge status: Skilled Nursing Facility | Payer: Medicare Other | Source: Ambulatory Visit | Attending: Internal Medicine | Admitting: Internal Medicine

## 2020-03-02 ENCOUNTER — Encounter (HOSPITAL_COMMUNITY): Payer: Self-pay | Admitting: Emergency Medicine

## 2020-03-02 ENCOUNTER — Encounter: Payer: Self-pay | Admitting: Family Medicine

## 2020-03-02 ENCOUNTER — Other Ambulatory Visit: Payer: Self-pay

## 2020-03-02 ENCOUNTER — Ambulatory Visit (INDEPENDENT_AMBULATORY_CARE_PROVIDER_SITE_OTHER): Payer: Medicare Other | Admitting: Family Medicine

## 2020-03-02 ENCOUNTER — Inpatient Hospital Stay (HOSPITAL_COMMUNITY): Payer: Medicare Other | Admitting: Anesthesiology

## 2020-03-02 ENCOUNTER — Encounter (HOSPITAL_COMMUNITY)
Admission: EM | Disposition: A | Discharge status: Skilled Nursing Facility | Payer: Self-pay | Source: Ambulatory Visit | Attending: Family Medicine

## 2020-03-02 VITALS — BP 110/62 | HR 84

## 2020-03-02 DIAGNOSIS — I480 Paroxysmal atrial fibrillation: Secondary | ICD-10-CM | POA: Diagnosis present

## 2020-03-02 DIAGNOSIS — W19XXXA Unspecified fall, initial encounter: Secondary | ICD-10-CM | POA: Diagnosis not present

## 2020-03-02 DIAGNOSIS — Z66 Do not resuscitate: Secondary | ICD-10-CM | POA: Diagnosis present

## 2020-03-02 DIAGNOSIS — Z7901 Long term (current) use of anticoagulants: Secondary | ICD-10-CM

## 2020-03-02 DIAGNOSIS — I482 Chronic atrial fibrillation, unspecified: Secondary | ICD-10-CM | POA: Diagnosis present

## 2020-03-02 DIAGNOSIS — R339 Retention of urine, unspecified: Secondary | ICD-10-CM | POA: Diagnosis not present

## 2020-03-02 DIAGNOSIS — L03116 Cellulitis of left lower limb: Secondary | ICD-10-CM

## 2020-03-02 DIAGNOSIS — W07XXXA Fall from chair, initial encounter: Secondary | ICD-10-CM | POA: Diagnosis present

## 2020-03-02 DIAGNOSIS — E871 Hypo-osmolality and hyponatremia: Secondary | ICD-10-CM | POA: Diagnosis present

## 2020-03-02 DIAGNOSIS — E039 Hypothyroidism, unspecified: Secondary | ICD-10-CM | POA: Diagnosis present

## 2020-03-02 DIAGNOSIS — M7042 Prepatellar bursitis, left knee: Secondary | ICD-10-CM | POA: Diagnosis present

## 2020-03-02 DIAGNOSIS — Y92009 Unspecified place in unspecified non-institutional (private) residence as the place of occurrence of the external cause: Secondary | ICD-10-CM | POA: Diagnosis not present

## 2020-03-02 DIAGNOSIS — K5900 Constipation, unspecified: Secondary | ICD-10-CM | POA: Diagnosis not present

## 2020-03-02 DIAGNOSIS — I10 Essential (primary) hypertension: Secondary | ICD-10-CM | POA: Diagnosis present

## 2020-03-02 DIAGNOSIS — S8002XA Contusion of left knee, initial encounter: Secondary | ICD-10-CM | POA: Diagnosis present

## 2020-03-02 DIAGNOSIS — E785 Hyperlipidemia, unspecified: Secondary | ICD-10-CM | POA: Diagnosis present

## 2020-03-02 DIAGNOSIS — T148XXA Other injury of unspecified body region, initial encounter: Secondary | ICD-10-CM

## 2020-03-02 DIAGNOSIS — K219 Gastro-esophageal reflux disease without esophagitis: Secondary | ICD-10-CM | POA: Diagnosis present

## 2020-03-02 DIAGNOSIS — Z20822 Contact with and (suspected) exposure to covid-19: Secondary | ICD-10-CM | POA: Diagnosis present

## 2020-03-02 HISTORY — PX: I & D EXTREMITY: SHX5045

## 2020-03-02 LAB — CBC WITH DIFFERENTIAL/PLATELET
Abs Immature Granulocytes: 0.02 10*3/uL (ref 0.00–0.07)
Basophils Absolute: 0 10*3/uL (ref 0.0–0.1)
Basophils Relative: 0 %
Eosinophils Absolute: 0 10*3/uL (ref 0.0–0.5)
Eosinophils Relative: 0 %
HCT: 27.1 % — ABNORMAL LOW (ref 36.0–46.0)
Hemoglobin: 8.9 g/dL — ABNORMAL LOW (ref 12.0–15.0)
Immature Granulocytes: 0 %
Lymphocytes Relative: 14 %
Lymphs Abs: 0.8 10*3/uL (ref 0.7–4.0)
MCH: 33.5 pg (ref 26.0–34.0)
MCHC: 32.8 g/dL (ref 30.0–36.0)
MCV: 101.9 fL — ABNORMAL HIGH (ref 80.0–100.0)
Monocytes Absolute: 0.7 10*3/uL (ref 0.1–1.0)
Monocytes Relative: 12 %
Neutro Abs: 4.3 10*3/uL (ref 1.7–7.7)
Neutrophils Relative %: 74 %
Platelets: 139 10*3/uL — ABNORMAL LOW (ref 150–400)
RBC: 2.66 MIL/uL — ABNORMAL LOW (ref 3.87–5.11)
RDW: 16.6 % — ABNORMAL HIGH (ref 11.5–15.5)
WBC: 5.9 10*3/uL (ref 4.0–10.5)
nRBC: 0 % (ref 0.0–0.2)

## 2020-03-02 LAB — COMPREHENSIVE METABOLIC PANEL
ALT: 21 U/L (ref 0–44)
AST: 22 U/L (ref 15–41)
Albumin: 3.3 g/dL — ABNORMAL LOW (ref 3.5–5.0)
Alkaline Phosphatase: 54 U/L (ref 38–126)
Anion gap: 8 (ref 5–15)
BUN: 13 mg/dL (ref 8–23)
CO2: 25 mmol/L (ref 22–32)
Calcium: 9.2 mg/dL (ref 8.9–10.3)
Chloride: 98 mmol/L (ref 98–111)
Creatinine, Ser: 0.63 mg/dL (ref 0.44–1.00)
GFR calc Af Amer: >60 mL/min (ref >60)
GFR calc non Af Amer: >60 mL/min (ref >60)
Glucose, Bld: 119 mg/dL — ABNORMAL HIGH (ref 70–99)
Potassium: 3.9 mmol/L (ref 3.5–5.1)
Sodium: 131 mmol/L — ABNORMAL LOW (ref 135–145)
Total Bilirubin: 1.5 mg/dL — ABNORMAL HIGH (ref 0.3–1.2)
Total Protein: 6.5 g/dL (ref 6.5–8.1)

## 2020-03-02 LAB — SARS CORONAVIRUS 2 BY RT PCR (HOSPITAL ORDER, PERFORMED IN ~~LOC~~ HOSPITAL LAB): SARS Coronavirus 2: NEGATIVE

## 2020-03-02 SURGERY — IRRIGATION AND DEBRIDEMENT EXTREMITY
Anesthesia: General | Site: Knee | Laterality: Left

## 2020-03-02 MED ORDER — LEVOTHYROXINE SODIUM 75 MCG PO TABS
75.0000 ug | ORAL_TABLET | Freq: Every day | ORAL | Status: DC
  Administered 2020-03-03 – 2020-03-08 (×6): 75 ug via ORAL
  Filled 2020-03-02 (×6): qty 1

## 2020-03-02 MED ORDER — ACETAMINOPHEN 500 MG PO TABS
1000.0000 mg | ORAL_TABLET | Freq: Four times a day (QID) | ORAL | Status: AC
  Administered 2020-03-02 – 2020-03-03 (×4): 1000 mg via ORAL
  Filled 2020-03-02 (×4): qty 2

## 2020-03-02 MED ORDER — ONDANSETRON HCL 4 MG/2ML IJ SOLN
INTRAMUSCULAR | Status: AC
  Filled 2020-03-02: qty 2

## 2020-03-02 MED ORDER — PRAVASTATIN SODIUM 10 MG PO TABS
10.0000 mg | ORAL_TABLET | Freq: Every day | ORAL | Status: DC
  Administered 2020-03-03 – 2020-03-08 (×6): 10 mg via ORAL
  Filled 2020-03-02 (×6): qty 1

## 2020-03-02 MED ORDER — ESMOLOL HCL 100 MG/10ML IV SOLN
INTRAVENOUS | Status: DC | PRN
Start: 2020-03-02 — End: 2020-03-02
  Administered 2020-03-02: 10 mg via INTRAVENOUS
  Administered 2020-03-02: 20 mg via INTRAVENOUS
  Administered 2020-03-02: 10 mg via INTRAVENOUS

## 2020-03-02 MED ORDER — TOBRAMYCIN SULFATE 1.2 G IJ SOLR
INTRAMUSCULAR | Status: DC | PRN
  Administered 2020-03-02: 1.2 g via TOPICAL

## 2020-03-02 MED ORDER — DOCUSATE SODIUM 100 MG PO CAPS: 100.0000 mg | ORAL_CAPSULE | Freq: Two times a day (BID) | ORAL | Status: DC

## 2020-03-02 MED ORDER — FENTANYL CITRATE (PF) 100 MCG/2ML IJ SOLN
50.0000 ug | Freq: Once | INTRAMUSCULAR | Status: AC
  Administered 2020-03-02: 50 ug via INTRAVENOUS
  Filled 2020-03-02: qty 2

## 2020-03-02 MED ORDER — MORPHINE SULFATE (PF) 2 MG/ML IV SOLN
0.5000 mg | INTRAVENOUS | Status: DC | PRN
  Filled 2020-03-02: qty 1

## 2020-03-02 MED ORDER — ORAL CARE MOUTH RINSE: 15.0000 mL | Freq: Once | OROMUCOSAL | Status: AC

## 2020-03-02 MED ORDER — OXYCODONE HCL 5 MG PO TABS: 5.0000 mg | ORAL_TABLET | ORAL | Status: DC | PRN

## 2020-03-02 MED ORDER — LACTATED RINGERS IV SOLN
INTRAVENOUS | Status: DC | PRN
Start: 2020-03-02 — End: 2020-03-02

## 2020-03-02 MED ORDER — POVIDONE-IODINE 10 % EX SWAB: 2.0000 "application " | Freq: Once | CUTANEOUS | Status: DC

## 2020-03-02 MED ORDER — 0.9 % SODIUM CHLORIDE (POUR BTL) OPTIME
TOPICAL | Status: DC | PRN
  Administered 2020-03-02: 1000 mL
  Administered 2020-03-02: 3000 mL
  Administered 2020-03-02: 1000 mL

## 2020-03-02 MED ORDER — CLINDAMYCIN PHOSPHATE 300 MG/50ML IV SOLN
300.0000 mg | Freq: Once | INTRAVENOUS | Status: AC
  Administered 2020-03-02: 300 mg via INTRAVENOUS
  Filled 2020-03-02: qty 50

## 2020-03-02 MED ORDER — ACETAMINOPHEN 325 MG PO TABS: 325.0000 mg | ORAL_TABLET | Freq: Four times a day (QID) | ORAL | Status: DC | PRN

## 2020-03-02 MED ORDER — LIDOCAINE 2% (20 MG/ML) 5 ML SYRINGE
INTRAMUSCULAR | Status: AC
  Filled 2020-03-02: qty 5

## 2020-03-02 MED ORDER — CHLORHEXIDINE GLUCONATE 0.12 % MT SOLN
15.0000 mL | Freq: Once | OROMUCOSAL | Status: AC
  Administered 2020-03-02: 15 mL via OROMUCOSAL
  Filled 2020-03-02: qty 15

## 2020-03-02 MED ORDER — DEXAMETHASONE SODIUM PHOSPHATE 10 MG/ML IJ SOLN
INTRAMUSCULAR | Status: AC
  Filled 2020-03-02: qty 1

## 2020-03-02 MED ORDER — PROPOFOL 10 MG/ML IV BOLUS
INTRAVENOUS | Status: DC | PRN
  Administered 2020-03-02: 20 mg via INTRAVENOUS
  Administered 2020-03-02: 100 mg via INTRAVENOUS

## 2020-03-02 MED ORDER — FENTANYL CITRATE (PF) 250 MCG/5ML IJ SOLN
INTRAMUSCULAR | Status: DC | PRN
  Administered 2020-03-02: 25 ug via INTRAVENOUS
  Administered 2020-03-02: 100 ug via INTRAVENOUS
  Administered 2020-03-02: 25 ug via INTRAVENOUS

## 2020-03-02 MED ORDER — FENTANYL CITRATE (PF) 100 MCG/2ML IJ SOLN
INTRAMUSCULAR | Status: AC
  Administered 2020-03-02: 25 ug
  Filled 2020-03-02: qty 2

## 2020-03-02 MED ORDER — TRANEXAMIC ACID-NACL 1000-0.7 MG/100ML-% IV SOLN
INTRAVENOUS | Status: AC
  Filled 2020-03-02: qty 100

## 2020-03-02 MED ORDER — TRANEXAMIC ACID 1000 MG/10ML IV SOLN
2000.0000 mg | Freq: Once | INTRAVENOUS | Status: AC
  Administered 2020-03-02: 2000 mg via TOPICAL
  Filled 2020-03-02: qty 20

## 2020-03-02 MED ORDER — CLINDAMYCIN PHOSPHATE 600 MG/50ML IV SOLN
600.0000 mg | Freq: Three times a day (TID) | INTRAVENOUS | Status: AC
  Administered 2020-03-03 – 2020-03-04 (×2): 600 mg via INTRAVENOUS
  Filled 2020-03-02 (×2): qty 50

## 2020-03-02 MED ORDER — HYDROMORPHONE HCL 1 MG/ML IJ SOLN: 0.5000 mg | INTRAMUSCULAR | Status: DC | PRN

## 2020-03-02 MED ORDER — METOCLOPRAMIDE HCL 5 MG/ML IJ SOLN: 5.0000 mg | Freq: Three times a day (TID) | INTRAMUSCULAR | Status: DC | PRN

## 2020-03-02 MED ORDER — ONDANSETRON HCL 4 MG/2ML IJ SOLN
INTRAMUSCULAR | Status: DC | PRN
  Administered 2020-03-02: 4 mg via INTRAVENOUS

## 2020-03-02 MED ORDER — BISACODYL 5 MG PO TBEC: 5.0000 mg | DELAYED_RELEASE_TABLET | Freq: Every day | ORAL | Status: DC | PRN

## 2020-03-02 MED ORDER — HYDROCODONE-ACETAMINOPHEN 5-325 MG PO TABS
1.0000 | ORAL_TABLET | Freq: Four times a day (QID) | ORAL | Status: DC | PRN
  Administered 2020-03-03 – 2020-03-05 (×4): 2 via ORAL
  Administered 2020-03-06 (×2): 1 via ORAL
  Filled 2020-03-02: qty 1
  Filled 2020-03-02 (×2): qty 2
  Filled 2020-03-02: qty 1
  Filled 2020-03-02 (×2): qty 2

## 2020-03-02 MED ORDER — ONDANSETRON HCL 4 MG/2ML IJ SOLN: 4.0000 mg | Freq: Four times a day (QID) | INTRAMUSCULAR | Status: DC | PRN

## 2020-03-02 MED ORDER — PROPOFOL 10 MG/ML IV BOLUS
INTRAVENOUS | Status: AC
  Filled 2020-03-02: qty 20

## 2020-03-02 MED ORDER — CHLORHEXIDINE GLUCONATE 4 % EX LIQD
60.0000 mL | Freq: Once | CUTANEOUS | Status: DC
  Filled 2020-03-02: qty 60

## 2020-03-02 MED ORDER — LIDOCAINE 2% (20 MG/ML) 5 ML SYRINGE
INTRAMUSCULAR | Status: DC | PRN
  Administered 2020-03-02: 80 mg via INTRAVENOUS

## 2020-03-02 MED ORDER — METOCLOPRAMIDE HCL 5 MG PO TABS: 5.0000 mg | ORAL_TABLET | Freq: Three times a day (TID) | ORAL | Status: DC | PRN

## 2020-03-02 MED ORDER — METHOCARBAMOL 500 MG PO TABS: 500.0000 mg | ORAL_TABLET | Freq: Four times a day (QID) | ORAL | Status: DC | PRN

## 2020-03-02 MED ORDER — DOCUSATE SODIUM 100 MG PO CAPS
100.0000 mg | ORAL_CAPSULE | Freq: Two times a day (BID) | ORAL | Status: DC
  Administered 2020-03-02 – 2020-03-07 (×10): 100 mg via ORAL
  Filled 2020-03-02 (×12): qty 1

## 2020-03-02 MED ORDER — EPHEDRINE SULFATE 50 MG/ML IJ SOLN
INTRAMUSCULAR | Status: DC | PRN
Start: 2020-03-02 — End: 2020-03-02
  Administered 2020-03-02: 2.5 mg via INTRAVENOUS

## 2020-03-02 MED ORDER — METHOCARBAMOL 1000 MG/10ML IJ SOLN
500.0000 mg | Freq: Four times a day (QID) | INTRAVENOUS | Status: DC | PRN
  Filled 2020-03-02: qty 5

## 2020-03-02 MED ORDER — TRANEXAMIC ACID-NACL 1000-0.7 MG/100ML-% IV SOLN
1000.0000 mg | INTRAVENOUS | Status: AC
  Administered 2020-03-02: 1000 mg via INTRAVENOUS
  Filled 2020-03-02 (×2): qty 100

## 2020-03-02 MED ORDER — PHENYLEPHRINE 40 MCG/ML (10ML) SYRINGE FOR IV PUSH (FOR BLOOD PRESSURE SUPPORT)
PREFILLED_SYRINGE | INTRAVENOUS | Status: AC
  Filled 2020-03-02: qty 20

## 2020-03-02 MED ORDER — PHENYLEPHRINE 40 MCG/ML (10ML) SYRINGE FOR IV PUSH (FOR BLOOD PRESSURE SUPPORT)
PREFILLED_SYRINGE | INTRAVENOUS | Status: AC
  Filled 2020-03-02: qty 10

## 2020-03-02 MED ORDER — DILTIAZEM HCL ER 60 MG PO CP12
120.0000 mg | ORAL_CAPSULE | Freq: Two times a day (BID) | ORAL | Status: DC
  Administered 2020-03-03 – 2020-03-08 (×11): 120 mg via ORAL
  Filled 2020-03-02 (×14): qty 2

## 2020-03-02 MED ORDER — PHENYLEPHRINE HCL (PRESSORS) 10 MG/ML IV SOLN
INTRAVENOUS | Status: DC | PRN
  Administered 2020-03-02 (×3): 120 ug via INTRAVENOUS
  Administered 2020-03-02 (×3): 80 ug via INTRAVENOUS

## 2020-03-02 MED ORDER — RIVAROXABAN 20 MG PO TABS
20.0000 mg | ORAL_TABLET | Freq: Every day | ORAL | Status: DC
  Administered 2020-03-03 – 2020-03-05 (×3): 20 mg via ORAL
  Filled 2020-03-02 (×3): qty 1

## 2020-03-02 MED ORDER — TOBRAMYCIN SULFATE 1.2 G IJ SOLR
INTRAMUSCULAR | Status: AC
  Filled 2020-03-02: qty 1.2

## 2020-03-02 MED ORDER — LACTATED RINGERS IV SOLN: INTRAVENOUS | Status: DC

## 2020-03-02 MED ORDER — DEXAMETHASONE SODIUM PHOSPHATE 10 MG/ML IJ SOLN
INTRAMUSCULAR | Status: DC | PRN
  Administered 2020-03-02: 5 mg via INTRAVENOUS

## 2020-03-02 MED ORDER — CLINDAMYCIN PHOSPHATE 900 MG/50ML IV SOLN
900.0000 mg | INTRAVENOUS | Status: AC
  Administered 2020-03-02: 900 mg via INTRAVENOUS
  Filled 2020-03-02: qty 50

## 2020-03-02 MED ORDER — FENTANYL CITRATE (PF) 250 MCG/5ML IJ SOLN
INTRAMUSCULAR | Status: AC
  Filled 2020-03-02: qty 5

## 2020-03-02 MED ORDER — POLYETHYLENE GLYCOL 3350 17 G PO PACK: 17.0000 g | PACK | Freq: Every day | ORAL | Status: DC | PRN

## 2020-03-02 MED ORDER — ACETAMINOPHEN 500 MG PO TABS
1000.0000 mg | ORAL_TABLET | Freq: Once | ORAL | Status: DC
  Filled 2020-03-02: qty 2

## 2020-03-02 SURGICAL SUPPLY — 68 items
ALCOHOL 70% 16 OZ (MISCELLANEOUS) ×3 IMPLANT
APL SKNCLS STERI-STRIP NONHPOA (GAUZE/BANDAGES/DRESSINGS) ×2
BAG DECANTER FOR FLEXI CONT (MISCELLANEOUS) ×3 IMPLANT
BENZOIN TINCTURE PRP APPL 2/3 (GAUZE/BANDAGES/DRESSINGS) ×4 IMPLANT
BNDG CMPR MED 10X6 ELC LF (GAUZE/BANDAGES/DRESSINGS) ×2
BNDG COHESIVE 4X5 TAN STRL (GAUZE/BANDAGES/DRESSINGS) IMPLANT
BNDG ELASTIC 4X5.8 VLCR STR LF (GAUZE/BANDAGES/DRESSINGS) IMPLANT
BNDG ELASTIC 6X10 VLCR STRL LF (GAUZE/BANDAGES/DRESSINGS) ×4 IMPLANT
BNDG GAUZE ELAST 4 BULKY (GAUZE/BANDAGES/DRESSINGS) IMPLANT
CANISTER WOUND CARE 500ML ATS (WOUND CARE) ×2 IMPLANT
COVER SURGICAL LIGHT HANDLE (MISCELLANEOUS) ×3 IMPLANT
COVER WAND RF STERILE (DRAPES) ×3 IMPLANT
CUFF TOURN SGL QUICK 18X4 (TOURNIQUET CUFF) IMPLANT
CUFF TOURN SGL QUICK 24 (TOURNIQUET CUFF)
CUFF TOURN SGL QUICK 34 (TOURNIQUET CUFF) ×3
CUFF TOURN SGL QUICK 42 (TOURNIQUET CUFF) IMPLANT
CUFF TRNQT CYL 24X4X16.5-23 (TOURNIQUET CUFF) IMPLANT
CUFF TRNQT CYL 34X4.125X (TOURNIQUET CUFF) IMPLANT
DRAPE U-SHAPE 47X51 STRL (DRAPES) ×3 IMPLANT
DRSG PAD ABDOMINAL 8X10 ST (GAUZE/BANDAGES/DRESSINGS) IMPLANT
DURAPREP 26ML APPLICATOR (WOUND CARE) ×3 IMPLANT
ELECT REM PT RETURN 9FT ADLT (ELECTROSURGICAL) ×3
ELECTRODE REM PT RTRN 9FT ADLT (ELECTROSURGICAL) ×1 IMPLANT
EVACUATOR 1/8 PVC DRAIN (DRAIN) ×3 IMPLANT
GAUZE SPONGE 4X4 12PLY STRL (GAUZE/BANDAGES/DRESSINGS) IMPLANT
GAUZE XEROFORM 5X9 LF (GAUZE/BANDAGES/DRESSINGS) ×2 IMPLANT
GLOVE BIOGEL PI IND STRL 7.0 (GLOVE) ×1 IMPLANT
GLOVE BIOGEL PI IND STRL 8 (GLOVE) ×1 IMPLANT
GLOVE BIOGEL PI INDICATOR 7.0 (GLOVE) ×2
GLOVE BIOGEL PI INDICATOR 8 (GLOVE) ×2
GLOVE ECLIPSE 7.0 STRL STRAW (GLOVE) ×3 IMPLANT
GLOVE ECLIPSE 8.0 STRL XLNG CF (GLOVE) ×3 IMPLANT
GOWN STRL REUS W/ TWL LRG LVL3 (GOWN DISPOSABLE) ×2 IMPLANT
GOWN STRL REUS W/ TWL XL LVL3 (GOWN DISPOSABLE) ×1 IMPLANT
GOWN STRL REUS W/TWL LRG LVL3 (GOWN DISPOSABLE) ×6
GOWN STRL REUS W/TWL XL LVL3 (GOWN DISPOSABLE) ×3
HANDPIECE INTERPULSE COAX TIP (DISPOSABLE)
IMMOBILIZER KNEE 22 UNIV (SOFTGOODS) ×2 IMPLANT
KIT BASIN OR (CUSTOM PROCEDURE TRAY) ×3 IMPLANT
KIT PREVENA INCISION MGT 13 (CANNISTER) ×2 IMPLANT
KIT TURNOVER KIT B (KITS) ×3 IMPLANT
MANIFOLD NEPTUNE II (INSTRUMENTS) ×3 IMPLANT
NS IRRIG 1000ML POUR BTL (IV SOLUTION) ×18 IMPLANT
PACK ORTHO EXTREMITY (CUSTOM PROCEDURE TRAY) ×3 IMPLANT
PAD ARMBOARD 7.5X6 YLW CONV (MISCELLANEOUS) ×6 IMPLANT
PAD CAST 4YDX4 CTTN HI CHSV (CAST SUPPLIES) IMPLANT
PADDING CAST COTTON 4X4 STRL (CAST SUPPLIES)
PADDING CAST COTTON 6X4 STRL (CAST SUPPLIES) ×4 IMPLANT
SET HNDPC FAN SPRY TIP SCT (DISPOSABLE) IMPLANT
SPONGE LAP 18X18 RF (DISPOSABLE) ×6 IMPLANT
SPONGE LAP 4X18 RFD (DISPOSABLE) IMPLANT
STOCKINETTE IMPERVIOUS 9X36 MD (GAUZE/BANDAGES/DRESSINGS) IMPLANT
SUT ETHILON 2 0 FS 18 (SUTURE) ×6 IMPLANT
SUT ETHILON 3 0 PS 1 (SUTURE) IMPLANT
SUT VIC AB 0 CT1 36 (SUTURE) ×6 IMPLANT
SUT VIC AB 2-0 SH 27 (SUTURE) ×3
SUT VIC AB 2-0 SH 27X BRD (SUTURE) ×1 IMPLANT
SUT VIC AB 3-0 SH 27 (SUTURE)
SUT VIC AB 3-0 SH 27X BRD (SUTURE) IMPLANT
SWAB CULTURE ESWAB REG 1ML (MISCELLANEOUS) IMPLANT
SWAB CULTURE LIQ STUART DBL (MISCELLANEOUS) IMPLANT
TOWEL GREEN STERILE (TOWEL DISPOSABLE) ×3 IMPLANT
TOWEL GREEN STERILE FF (TOWEL DISPOSABLE) ×3 IMPLANT
TUBE CONNECTING 12'X1/4 (SUCTIONS) ×1
TUBE CONNECTING 12X1/4 (SUCTIONS) ×2 IMPLANT
UNDERPAD 30X36 HEAVY ABSORB (UNDERPADS AND DIAPERS) ×3 IMPLANT
WATER STERILE IRR 1000ML POUR (IV SOLUTION) ×3 IMPLANT
YANKAUER SUCT BULB TIP NO VENT (SUCTIONS) ×3 IMPLANT

## 2020-03-02 NOTE — Progress Notes (Signed)
Arrived to 5C13.  Family at bedside.  Oriented to room.  Vitals signs taken.

## 2020-03-02 NOTE — Consult Note (Signed)
Reason for Consult:Left knee hematoma Referring Physician: E Christyann Alvarado is an 86 y.o. female.  HPI: Grace Alvarado fell 10d ago and hurt her left knee. There was no fracture but she developed a hematoma. This has been hurting a lot and is getting worse. She can barely ambulate with her RW. Dr. Georgina Snell attempted to aspirate in the office but was unsuccessful. She is on Xarelto for afib.  Past Medical History:  Diagnosis Date  . Atrial fibrillation ([place])   . Hyperlipidemia   . Hypertension   . Hypothyroidism   . Mitral regurgitation   . MVP (mitral valve prolapse)     Past Surgical History:  Procedure Laterality Date  . APPENDECTOMY    . c section    . CESAREAN SECTION    . CHOLECYSTECTOMY    . COLONOSCOPY WITH PROPOFOL N/A 11/09/2018   Procedure: COLONOSCOPY WITH PROPOFOL;  Surgeon: Doran Stabler, MD;  Location: [place];  Service: Gastroenterology;  Laterality: N/A;  . ESOPHAGOGASTRODUODENOSCOPY (EGD) WITH PROPOFOL N/A 11/09/2018   Procedure: ESOPHAGOGASTRODUODENOSCOPY (EGD) WITH PROPOFOL;  Surgeon: Doran Stabler, MD;  Location: [place];  Service: Gastroenterology;  Laterality: N/A;  . HOT HEMOSTASIS N/A 11/09/2018   Procedure: HOT HEMOSTASIS (ARGON PLASMA COAGULATION/BICAP);  Surgeon: Doran Stabler, MD;  Location: [place];  Service: Gastroenterology;  Laterality: N/A;  . SUBMUCOSAL INJECTION  11/09/2018   Procedure: SUBMUCOSAL INJECTION;  Surgeon: Doran Stabler, MD;  Location: [facility] END[facility]COPY;  Service: Gastroenterology;;    Family History  Problem Relation Age of Onset  . Cancer Mother   . Dementia Mother     Social History:  reports that she has quit smoking. She has never used smokeless tobacco. She reports previous alcohol use. She reports that she does not use drugs.  Allergies:  Allergies  Allergen Reactions  . Codeine Other (See Comments)    unknown  . Penicillins Other (See Comments)    unknown  . Vancomycin Other (See Comments)    unknown   . Zithromax [Azithromycin] Other (See Comments)    unknown  . Latex Rash    Medications: I have reviewed the patient's current medications.  Results for orders placed or performed during the hospital encounter of 03/02/20 (from the past 48 hour(s))  Comprehensive metabolic panel     Status: Abnormal   Collection Time: 03/02/20  8:53 AM  Result Value Ref Range   Sodium 131 (L) 135 - 145 mmol/L   Potassium 3.9 3.5 - 5.1 mmol/L   Chloride 98 98 - 111 mmol/L   CO2 25 22 - 32 mmol/L   Glucose, Bld 119 (H) 70 - 99 mg/dL    Comment: Glucose reference range applies only to samples taken after fasting for at least 8 hours.   BUN 13 8 - 23 mg/dL   Creatinine, Ser 0.63 0.44 - 1.00 mg/dL   Calcium 9.2 8.9 - 10.3 mg/dL   Total Protein 6.5 6.5 - 8.1 g/dL   Albumin 3.3 (L) 3.5 - 5.0 g/dL   AST 22 15 - 41 U/L   ALT 21 0 - 44 U/L   Alkaline Phosphatase 54 38 - 126 U/L   Total Bilirubin 1.5 (H) 0.3 - 1.2 mg/dL   GFR calc non Af Amer >60 >60 mL/min   GFR calc Af Amer >60 >60 mL/min   Anion gap 8 5 - 15    Comment: Performed at [facility] [address], [place], [place] [address]  CBC with Differential     Status: Abnormal   Collection Time: 03/02/20  8:53 AM  Result Value Ref Range   WBC 5.9 4.0 - 10.5 K/uL   RBC 2.66 (L) 3.87 - 5.11 MIL/uL   Hemoglobin 8.9 (L) 12.0 - 15.0 g/dL   HCT 65.7 (L) 84.6 - 96.2 %   MCV 101.9 (H) 80.0 - 100.0 fL   MCH 33.5 26.0 - 34.0 pg   MCHC 32.8 30.0 - 36.0 g/dL   RDW 95.2 (H) 84.1 - 32.4 %   Platelets 139 (L) 150 - 400 K/uL   nRBC 0.0 0.0 - 0.2 %   Neutrophils Relative % 74 %   Neutro Abs 4.3 1.7 - 7.7 K/uL   Lymphocytes Relative 14 %   Lymphs Abs 0.8 0.7 - 4.0 K/uL   Monocytes Relative 12 %   Monocytes Absolute 0.7 0.1 - 1.0 K/uL   Eosinophils Relative 0 %   Eosinophils Absolute 0.0 0.0 - 0.5 K/uL   Basophils Relative 0 %   Basophils Absolute 0.0 0.0 - 0.1 K/uL   Immature Granulocytes 0 %   Abs Immature Granulocytes 0.02 0.00 - 0.07 K/uL     Comment: Performed at [facility], [address], [place], [place] [address]    No results found.  Review of Systems  Constitutional: Negative for chills, diaphoresis and fever.  HENT: Negative for ear discharge, ear pain, hearing loss and tinnitus.   Eyes: Negative for photophobia and pain.  Respiratory: Negative for cough and shortness of breath.   Cardiovascular: Negative for chest pain.  Gastrointestinal: Negative for abdominal pain, nausea and vomiting.  Genitourinary: Negative for dysuria, flank pain, frequency and urgency.  Musculoskeletal: Positive for arthralgias (Left knee). Negative for back pain, myalgias and neck pain.  Neurological: Negative for dizziness and headaches.  Hematological: Does not bruise/bleed easily.  Psychiatric/Behavioral: The patient is not nervous/anxious.    Blood pressure (!) 112/45, pulse (!) 109, temperature 98.4 F (36.9 C), temperature source Oral, resp. rate 16, height 5\' 3"  (1.6 m), weight 60.3 kg, SpO2 100 %. Physical Exam  Constitutional: She appears well-developed and well-nourished. No distress.  HENT:  Head: Normocephalic and atraumatic.  Eyes: Conjunctivae are normal. Right eye exhibits no discharge. Left eye exhibits no discharge. No scleral icterus.  Cardiovascular: Normal rate and regular rhythm.  Respiratory: Effort normal. No respiratory distress.  Musculoskeletal:     Cervical back: Normal range of motion.     Comments: LLE Small abrasion knee, extensive dependent ecchymosis, no rash, large hematoma over anterior knee, severe TTP  No ankle effusion  Sens DPN, SPN, TN intact  Motor EHL, ext, flex, evers 5/5  DP 1+, PT 0, 1+ edema  Neurological: She is alert.  Skin: Skin is warm and dry. She is not diaphoretic.  Psychiatric: She has a normal mood and affect. Her behavior is normal.    Assessment/Plan: Left knee hematoma -- Plan on evacuation in the OR by Dr. this afternoon. May need plastics consult later on  depending on response. Please keep NPO. Multiple medical problems including afib on Xarelto, HTN, HLD, and hypothyroidism -- Will ask medicine to admit and manage. Hold Xarelto for now.    August Saucer, PA-C Orthopedic Surgery [phone] 03/02/2020, 10:48 AM

## 2020-03-02 NOTE — Anesthesia Procedure Notes (Signed)
Procedure Name: LMA Insertion Date/Time: 03/02/2020 7:06 PM Performed by: Zollie Scale, CRNA Pre-anesthesia Checklist: Patient identified, Emergency Drugs available, Suction available and Patient being monitored Patient Re-evaluated:Patient Re-evaluated prior to induction Oxygen Delivery Method: Circle System Utilized Preoxygenation: Pre-oxygenation with 100% oxygen Induction Type: IV induction Ventilation: Mask ventilation without difficulty LMA: LMA inserted LMA Size: 4.0 Number of attempts: 1 Airway Equipment and Method: Bite block Placement Confirmation: positive ETCO2 Tube secured with: Tape Dental Injury: Teeth and Oropharynx as per pre-operative assessment

## 2020-03-02 NOTE — ED Notes (Signed)
Pt last eat or drink was 0800 03/02/20

## 2020-03-02 NOTE — Brief Op Note (Signed)
   03/02/2020  8:31 PM  PATIENT:  Grace Alvarado  86 y.o. female  PRE-OPERATIVE DIAGNOSIS:  Left knee hematoma  POST-OPERATIVE DIAGNOSIS:  Left knee hematoma  PROCEDURE:  Procedure(s): evacuation of left knee hematoma, possible wound vac placement  SURGEON:  Surgeon(s): Cammy Copa, MD  ASSISTANT: magnant pa  ANESTHESIA:   general  EBL: 50 ml    Total I/O In: 950 [I.V.:800; IV Piggyback:150] Out: 50 [Blood:50]  BLOOD ADMINISTERED: none  DRAINS: none   LOCAL MEDICATIONS USED:  none  SPECIMEN:  No Specimen  COUNTS:  YES  TOURNIQUET:  * Missing tourniquet times found for documented tourniquets in log: [id number] *  DICTATION: .Other Dictation: Dictation Number [id number]  PLAN OF CARE: Admit to inpatient   PATIENT DISPOSITION:  PACU - hemodynamically stable  Plan for weightbearing as tolerated with physical therapy Incisional wound VAC for 5 days Restart Xarelto tomorrow night Discontinue Hemovac tomorrow Keep knee immobilizer on for approximately 10 days

## 2020-03-02 NOTE — ED Provider Notes (Signed)
[facility] EMERGENCY DEPARTMENT Provider Note   CSN: [id number] Arrival date & time: 03/02/20  0263     History Chief Complaint  Patient presents with  . Leg Problem    Suzanna Zahn is a 86 y.o. female with past medical history significant for atrial fibrillation on Xarelto presents to the ED with left knee cellulitis.  I reviewed patient's medical record and she was first evaluated in the ER on 02/22/2020 for left knee pain and swelling subsequent to fall.  She was subsequently evaluated by Dr. Denyse Amass, sports medicine, who diagnosed her with prepatellar bursitis.  She was initially prescribed doxycycline, but it failed to alleviate her symptoms.  Yesterday, 03/01/2020, Dr. Denyse Amass attempted incision and drainage procedure and obtained cultures.  Antibiotics were changed from doxycycline to clindamycin.  She returned for her follow-up this morning and has not demonstrated any evidence of improvement.  Last dose of her Xarelto was last evening.  She appears to have a large hematoma draining from I&D site.  She was sent to the ER for evaluation with plan for intervention by Dr. August Saucer, orthopedics.  HPI     Past Medical History:  Diagnosis Date  . Atrial fibrillation (HCC)   . Hyperlipidemia   . Hypertension   . Hypothyroidism   . Mitral regurgitation   . MVP (mitral valve prolapse)     Patient Active Problem List   Diagnosis Date Noted  . Neuropathy 01/24/2020  . Chronic back pain 01/24/2020  . Complaints of leg weakness 01/24/2020  . Compression fracture of T11 vertebra (HCC) 01/23/2020  . Leg pain 01/03/2020  . Chronic anticoagulation 01/03/2020  . Compression fracture of L1 lumbar vertebra (HCC) 07/27/2019  . Low back pain 07/20/2019  . Hyperglycemia 05/01/2019  . Neck pain, musculoskeletal 11/16/2018  . Constipation 11/16/2018  . Hypokalemia   . AVM (arteriovenous malformation) of colon   . Lower GI bleed 11/06/2018  . Iron deficiency anemia 11/02/2018  .  Lymphedema of left lower extremity 10/22/2018  . B12 deficiency 05/27/2018  . Dependent edema 05/25/2018  . Pulmonary hypertension, primary (HCC) 05/20/2018  . Hyperlipidemia 05/20/2018  . CHF (congestive heart failure) (HCC) 05/20/2018  . Tricuspid regurgitation 05/17/2018  . Mitral regurgitation 05/17/2018  . Chronic a-fib 05/17/2018  . Essential hypertension 05/17/2018  . Hypothyroid 05/17/2018  . CAD (coronary artery disease) 05/17/2018  . Carotid atherosclerosis, bilateral 05/17/2018    Past Surgical History:  Procedure Laterality Date  . APPENDECTOMY    . c section    . CESAREAN SECTION    . CHOLECYSTECTOMY    . COLONOSCOPY WITH PROPOFOL N/A 11/09/2018   Procedure: COLONOSCOPY WITH PROPOFOL;  Surgeon: Sherrilyn Rist, MD;  Location: [facility] ENDOSCOPY;  Service: Gastroenterology;  Laterality: N/A;  . ESOPHAGOGASTRODUODENOSCOPY (EGD) WITH PROPOFOL N/A 11/09/2018   Procedure: ESOPHAGOGASTRODUODENOSCOPY (EGD) WITH PROPOFOL;  Surgeon: Sherrilyn Rist, MD;  Location: [facility] ENDOSCOPY;  Service: Gastroenterology;  Laterality: N/A;  . HOT HEMOSTASIS N/A 11/09/2018   Procedure: HOT HEMOSTASIS (ARGON PLASMA COAGULATION/BICAP);  Surgeon: Sherrilyn Rist, MD;  Location: [facility] ENDOSCOPY;  Service: Gastroenterology;  Laterality: N/A;  . SUBMUCOSAL INJECTION  11/09/2018   Procedure: SUBMUCOSAL INJECTION;  Surgeon: Sherrilyn Rist, MD;  Location: [facility] ENDOSCOPY;  Service: Gastroenterology;;     OB History   No obstetric history on file.     Family History  Problem Relation Age of Onset  . Cancer Mother   . Dementia Mother     Social History  Tobacco Use  . Smoking status: Former Research scientist (life sciences)  . Smokeless tobacco: Never Used  Substance Use Topics  . Alcohol use: Not Currently  . Drug use: Never    Home Medications Prior to Admission medications   Medication Sig Start Date End Date Taking? Authorizing Provider  cholecalciferol (VITAMIN D3) 25 MCG (1000 UT) tablet Take 1,000 Units by mouth  daily.    [provider]  clindamycin (CLEOCIN) 300 MG capsule Take 1 capsule (300 mg total) by mouth 3 (three) times daily. 03/01/20   Gregor Hams, MD  diltiazem (CARDIZEM SR) 120 MG 12 hr capsule Take 1 capsule (120 mg total) by mouth 2 (two) times daily. 01/23/20   Lelon Perla, MD  doxycycline (VIBRA-TABS) 100 MG tablet Take 1 tablet (100 mg total) by mouth 2 (two) times daily for 7 days. 02/24/20 03/02/20  Gregor Hams, MD  ferrous gluconate (FERGON) 324 MG tablet Take 2 tablets (648 mg total) by mouth every Monday, Wednesday, and Friday. 11/10/18   Florencia Reasons, MD  furosemide (LASIX) 20 MG tablet TAKE 40 MG EVERY DAY KEEP UPCOMING APPT FOR FUTURE REFILLS ./CY 01/13/20   Lelon Perla, MD  levothyroxine (SYNTHROID) 75 MCG tablet TAKE 1 TABLET (75 MCG TOTAL) BY MOUTH DAILY BEFORE BREAKFAST. 12/21/19   Binnie Rail, MD  Multiple Vitamin (VITAMIN E/FOLIC UJWJ/X-9/J-47 PO) Take 1 tablet by mouth daily.    [provider]  potassium chloride (KLOR-CON) 10 MEQ tablet TAKE 1 TABLET (10 MEQ) TWICE DAILY. 08/12/19   Binnie Rail, MD  pravastatin (PRAVACHOL) 10 MG tablet Take 1 tablet (10 mg total) by mouth daily. 12/20/18   Binnie Rail, MD  Probiotic Product (PRO-BIOTIC BLEND PO) Take 1 capsule by mouth daily.    [provider]  vitamin C (ASCORBIC ACID) 500 MG tablet Take 500 mg by mouth daily.    [provider]  Vitamin D, Ergocalciferol, (DRISDOL) 1.25 MG (50000 UNIT) CAPS capsule Take 1 capsule (50,000 Units total) by mouth every 7 (seven) days. 01/24/20   Burns, Claudina Lick, MD  XARELTO 20 MG TABS tablet TAKE 1 TABLET (20 MG TOTAL) BY MOUTH DAILY WITH SUPPER 09/21/19   Binnie Rail, MD    Allergies    Codeine, Penicillins, Vancomycin, Zithromax [azithromycin], and Latex  Review of Systems   Review of Systems  All other systems reviewed and are negative.   Physical Exam Updated Vital Signs BP 134/72 (BP Location: Right Arm)   Pulse 92   Temp 98.4  F (36.9 C) (Oral)   Resp (!) 25   Ht 5\' 3"  (1.6 m)   Wt 60.3 kg   LMP  (LMP Unknown)   SpO2 100%   BMI 23.56 kg/m   Physical Exam Vitals and nursing note reviewed. Exam conducted with a chaperone present.  Constitutional:      Appearance: Normal appearance.  HENT:     Head: Normocephalic and atraumatic.  Eyes:     General: No scleral icterus.    Conjunctiva/sclera: Conjunctivae normal.  Pulmonary:     Effort: Pulmonary effort is normal.  Musculoskeletal:     Comments: Left knee: Significant erythema and swelling involving anterior aspect of knee.  Large hematoma that is exquisitely TTP.  No active drainage from I&D site.  ROM significantly limited due to inflammation.  Patient has difficulty ambulating/weightbearing.  Swelling erythema extends distally.  Mild dependent edema.  Sensation intact.  Skin:    General: Skin is dry.  Neurological:  Mental Status: She is alert.     GCS: GCS eye subscore is 4. GCS verbal subscore is 5. GCS motor subscore is 6.  Psychiatric:        Mood and Affect: Mood normal.        Behavior: Behavior normal.        Thought Content: Thought content normal.     ED Results / Procedures / Treatments   Labs (all labs ordered are listed, but only abnormal results are displayed) Labs Reviewed  COMPREHENSIVE METABOLIC PANEL - Abnormal; Notable for the following components:      Result Value   Sodium 131 (*)    Glucose, Bld 119 (*)    Albumin 3.3 (*)    Total Bilirubin 1.5 (*)    All other components within normal limits  CBC WITH DIFFERENTIAL/PLATELET - Abnormal; Notable for the following components:   RBC 2.66 (*)    Hemoglobin 8.9 (*)    HCT 27.1 (*)    MCV 101.9 (*)    RDW 16.6 (*)    Platelets 139 (*)    All other components within normal limits  SARS CORONAVIRUS 2 BY RT PCR (HOSPITAL ORDER, PERFORMED IN [facility])    EKG None  Radiology No results found.  Procedures Procedures (including critical care  time)  Medications Ordered in ED Medications  fentaNYL (SUBLIMAZE) injection 50 mcg (has no administration in time range)  clindamycin (CLEOCIN) IVPB 300 mg (has no administration in time range)    ED Course  I have reviewed the triage vital signs and the nursing notes.  Pertinent labs & imaging results that were available during my care of the patient were reviewed by me and considered in my medical decision making (see chart for details).  Clinical Course as of Mar 02 1120  Fri Mar 02, 2020  1106 Spoke with Dr. Ophelia Charter who will evaluate and admit patient.   [GG]    Clinical Course User Index [GG] Elvera Maria   MDM Rules/Calculators/A&P                      Patient was personally evaluated by Charma Igo, PA-C, orthopedics.  Plan is for evacuation in OR performed by Dr. August Saucer later this afternoon.  Obtained basic laboratory work-up and COVID-19 testing.  She is already vaccinated.  Fentanyl for pain.  Will start on IV clindamycin.  Patient is denying any fevers or chills, headache or blurred vision, chest pain or shortness of breath, or other symptoms.  CBC obtained demonstrates a new anemia to 8.9 hemoglobin, down from 12.9 and labs obtained 2 months ago.  Will admit to medicine.  Spoke with Dr. Ophelia Charter who will evaluate and admit patient.  Final Clinical Impression(s) / ED Diagnoses Final diagnoses:  Hematoma    Rx / DC Orders ED Discharge Orders    None       Lorelee New, PA-C 03/02/20 1121    Eber Hong, MD 03/02/20 1224

## 2020-03-02 NOTE — Transfer of Care (Signed)
Immediate Anesthesia Transfer of Care Note  Patient: Grace Alvarado  Procedure(s) Performed: evacuation of left knee hematoma, possible wound vac placement (Left Knee)  Patient Location: PACU  Anesthesia Type:General  Level of Consciousness: awake, patient cooperative and responds to stimulation  Airway & Oxygen Therapy: Patient Spontanous Breathing and Patient connected to nasal cannula oxygen  Post-op Assessment: Report given to RN and Post -op Vital signs reviewed and stable  Post vital signs: Reviewed and stable  Last Vitals:  Vitals Value Taken Time  BP 99/78   Temp    Pulse 99   Resp 16   SpO2 95%     Last Pain:  Vitals:   03/02/20 1603  TempSrc: Oral  PainSc:          Complications: No apparent anesthesia complications

## 2020-03-02 NOTE — ED Triage Notes (Addendum)
Pt reports ongoing issues with her L knee, has been seeing dr Denyse Amass with ortho for patellar bursitis, was placed on doxycycline and then clindamycin yesterday but pt states pain has worsened. Saw dr Denyse Amass this morning and MD sent patient here for further eval/treatment today. Redness, bruising and swelling present to Lower left leg.

## 2020-03-02 NOTE — Patient Instructions (Addendum)
Thank you for coming in today. Go to [facility] ER.  The ED staff should contact Dr August Saucer who will see you in the ER.  I expect you will get labs and IV antibiotics.  Do not eat or drink.

## 2020-03-02 NOTE — Progress Notes (Signed)
   I, Grace Alvarado, LAT, ATC, am serving as scribe for Dr. Clementeen Graham.  Grace Alvarado is a 86 y.o. female who presents to [facility] today for f/u after an I&D procedure on her L prepatellar bursa performed yesterday.  Pt was prescribed clindamycin HCl 300mg  tid.  Since her visit yesterday afternoon, pt reports no change in her symptoms.  She has started her new antibiotic, taking one dose yesterday and one dose this morning.  Last Xarelto was yesterday evening.   Pertinent review of systems: No fevers or chills  Relevant historical information: A. fib ejection fraction preserved heart failure   Exam:  BP 110/62 (BP Location: Left Arm, Patient Position: Sitting, Cuff Size: Normal)   Pulse 84   LMP  (LMP Unknown)   SpO2 97%  General: Well Developed, well nourished, and in no acute distress.   MSK: Left knee large hematoma with draining I&D wound from yesterday.  Skin is erythematous and tender.        Lab and Radiology Results Wound culture pending    Assessment and Plan: 86 y.o. female with left knee hematoma/traumatic prepatellar bursitis failing conservative management.  Currently on clindamycin previously and doxycycline.  After discussion with Dr. 99 orthopedics will go to emergency room and proceed to likely surgery and IV antibiotics today.  Please contact Dr. August Alvarado upon arrival to emergency room.    Discussed warning signs or symptoms. Please see discharge instructions. Patient expresses understanding.   The above documentation has been reviewed and is accurate and complete Grace Alvarado, M.D.

## 2020-03-02 NOTE — Anesthesia Preprocedure Evaluation (Addendum)
Anesthesia Evaluation  Patient identified by MRN, date of birth, ID band Patient awake    Reviewed: Allergy & Precautions, NPO status , Patient's Chart, lab work & pertinent test results  History of Anesthesia Complications Negative for: history of anesthetic complications  Airway Mallampati: II  TM Distance: >3 FB Neck ROM: Full    Dental  (+) Edentulous Upper, Partial Lower, Dental Advisory Given   Pulmonary former smoker,    Pulmonary exam normal        Cardiovascular hypertension, + CAD and + Peripheral Vascular Disease  Normal cardiovascular exam+ dysrhythmias Atrial Fibrillation + Valvular Problems/Murmurs MVP and MR    '20 TTE - EF 60-65%. Right ventricular systolic pressure is moderately elevated. Moderately dilated left atrial size. Moderately dilated right atrial size. Mitral valve regurgitation is moderate to severe by color flow Doppler. Posterior prolapse and moderate to severe MR.    Neuro/Psych negative neurological ROS  negative psych ROS   GI/Hepatic negative GI ROS, Neg liver ROS,   Endo/Other  Hypothyroidism  Hyponatremia   Renal/GU negative Renal ROS     Musculoskeletal  (+) Arthritis ,   Abdominal   Peds  Hematology  (+) anemia ,  Plt 139k    Anesthesia Other Findings Covid neg 5/28  Reproductive/Obstetrics                            Anesthesia Physical Anesthesia Plan  ASA: III  Anesthesia Plan: General   Post-op Pain Management:    Induction: Intravenous  PONV Risk Score and Plan: 3 and Treatment may vary due to age or medical condition and Ondansetron  Airway Management Planned: LMA  Additional Equipment: None  Intra-op Plan:   Post-operative Plan: Extubation in OR  Informed Consent: I have reviewed the patients History and Physical, chart, labs and discussed the procedure including the risks, benefits and alternatives for the proposed anesthesia  with the patient or authorized representative who has indicated his/her understanding and acceptance.   Patient has DNR.   Dental advisory given  Plan Discussed with: CRNA and Anesthesiologist  Anesthesia Plan Comments:        Anesthesia Quick Evaluation

## 2020-03-02 NOTE — H&P (Signed)
History and Physical    Grace Alvarado WER:[id number] DOB: June 10, 1925 DOA: 03/02/2020  PCP: Binnie Rail, MD Consultants:  Tamala Julian - orthopedics; Stanford Breed - cardiology Patient coming from:  Home - lives alone; [facility]: Daughter, [phone]  Chief Complaint: knee infection  HPI: Grace Alvarado is a 86 y.o. female with medical history significant of hypothyroidism; HTN; HLD; and afib on Xarelto presenting with left knee infection.  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  She moved the puzzle off the table and was carrying it when she lost her balance and landed on both knees.  She called 911 and seemed to be ok.  The left knee started swelling and so they came to the ER on May 19 and discharged her.  She followed up with Dr. Georgina Snell a few days ago later, on 5/21.  She was given an antibiotic for traumatic prepatellar bursitis.  It was getting worse and so they were seen again yesterday; she was given Clindamycin and discharged.  She followed up again and it got worse again and so she was sent here.  No fever.    ED Course: Fall 2 weeks ago, seen yesterday bo orthopedics for prepatellar bursitis.  Placed on antibiotics but more drainage.  Dr. Marlou Sa wants to take her to the OR this afternoon.  Hgb 8.9, down from 12.  On Xarelto for afib.  Review of Systems: As per HPI; otherwise review of systems reviewed and negative.     Past Medical History:  Diagnosis Date  . Atrial fibrillation ([place])   . Hyperlipidemia   . Hypertension   . Hypothyroidism   . Mitral regurgitation   . MVP (mitral valve prolapse)     Past Surgical History:  Procedure Laterality Date  . APPENDECTOMY    . c section    . CESAREAN SECTION    . CHOLECYSTECTOMY    . COLONOSCOPY WITH PROPOFOL N/A 11/09/2018   Procedure: COLONOSCOPY WITH PROPOFOL;  Surgeon: Doran Stabler, MD;  Location: [place];  Service: Gastroenterology;  Laterality: N/A;  . ESOPHAGOGASTRODUODENOSCOPY (EGD) WITH PROPOFOL N/A 11/09/2018   Procedure:  ESOPHAGOGASTRODUODENOSCOPY (EGD) WITH PROPOFOL;  Surgeon: Doran Stabler, MD;  Location: [place];  Service: Gastroenterology;  Laterality: N/A;  . HOT HEMOSTASIS N/A 11/09/2018   Procedure: HOT HEMOSTASIS (ARGON PLASMA COAGULATION/BICAP);  Surgeon: Doran Stabler, MD;  Location: [place];  Service: Gastroenterology;  Laterality: N/A;  . SUBMUCOSAL INJECTION  11/09/2018   Procedure: SUBMUCOSAL INJECTION;  Surgeon: Doran Stabler, MD;  Location: [facility];  Service: Gastroenterology;;    Social History   Socioeconomic History  . Marital status: Widowed    Spouse name: Not on file  . Number of children: 3  . Years of education: Not on file  . Highest education level: Not on file  Occupational History  . Occupation: retired  Tobacco Use  . Smoking status: Former Research scientist (life sciences)  . Smokeless tobacco: Never Used  Substance and Sexual Activity  . Alcohol use: Not Currently  . Drug use: Never  . Sexual activity: Not Currently  Other Topics Concern  . Not on file  Social History Narrative  . Not on file   Social Determinants of Health   Financial Resource Strain:   . Difficulty of Paying Living Expenses:   Food Insecurity:   . Worried About Charity fundraiser in the Last Year:   . Arboriculturist in the Last Year:   Transportation Needs:   .  Lack of Transportation (Medical):   Marland Kitchen Lack of Transportation (Non-Medical):   Physical Activity:   . Days of Exercise per Week:   . Minutes of Exercise per Session:   Stress:   . Feeling of Stress :   Social Connections:   . Frequency of Communication with Friends and Family:   . Frequency of Social Gatherings with Friends and Family:   . Attends Religious Services:   . Active Member of Clubs or Organizations:   . Attends Banker Meetings:   Marland Kitchen Marital Status:   Intimate Partner Violence:   . Fear of Current or Ex-Partner:   . Emotionally Abused:   Marland Kitchen Physically Abused:   . Sexually Abused:     Allergies    Allergen Reactions  . Codeine Other (See Comments)    unknown  . Penicillins Other (See Comments)    unknown  . Vancomycin Other (See Comments)    unknown  . Zithromax [Azithromycin] Other (See Comments)    unknown  . Latex Rash    Family History  Problem Relation Age of Onset  . Cancer Mother   . Dementia Mother     Prior to Admission medications   Medication Sig Start Date End Date Taking? Authorizing Provider  cholecalciferol (VITAMIN D3) 25 MCG (1000 UT) tablet Take 1,000 Units by mouth daily.    [provider]  clindamycin (CLEOCIN) 300 MG capsule Take 1 capsule (300 mg total) by mouth 3 (three) times daily. 03/01/20   Rodolph Bong, MD  diltiazem (CARDIZEM SR) 120 MG 12 hr capsule Take 1 capsule (120 mg total) by mouth 2 (two) times daily. 01/23/20   Lewayne Bunting, MD  doxycycline (VIBRA-TABS) 100 MG tablet Take 1 tablet (100 mg total) by mouth 2 (two) times daily for 7 days. 02/24/20 03/02/20  Rodolph Bong, MD  ferrous gluconate (FERGON) 324 MG tablet Take 2 tablets (648 mg total) by mouth every Monday, Wednesday, and Friday. 11/10/18   Albertine Grates, MD  furosemide (LASIX) 20 MG tablet TAKE 40 MG EVERY DAY KEEP UPCOMING APPT FOR FUTURE REFILLS ./CY 01/13/20   Lewayne Bunting, MD  levothyroxine (SYNTHROID) 75 MCG tablet TAKE 1 TABLET (75 MCG TOTAL) BY MOUTH DAILY BEFORE BREAKFAST. 12/21/19   Pincus Sanes, MD  Multiple Vitamin (VITAMIN E/FOLIC ACID/B-6/B-12 PO) Take 1 tablet by mouth daily.    [provider]  potassium chloride (KLOR-CON) 10 MEQ tablet TAKE 1 TABLET (10 MEQ) TWICE DAILY. 08/12/19   Pincus Sanes, MD  pravastatin (PRAVACHOL) 10 MG tablet Take 1 tablet (10 mg total) by mouth daily. 12/20/18   Pincus Sanes, MD  Probiotic Product (PRO-BIOTIC BLEND PO) Take 1 capsule by mouth daily.    [provider]  vitamin C (ASCORBIC ACID) 500 MG tablet Take 500 mg by mouth daily.    [provider]  Vitamin D, Ergocalciferol, (DRISDOL) 1.25  MG (50000 UNIT) CAPS capsule Take 1 capsule (50,000 Units total) by mouth every 7 (seven) days. 01/24/20   Burns, Bobette Mo, MD  XARELTO 20 MG TABS tablet TAKE 1 TABLET (20 MG TOTAL) BY MOUTH DAILY WITH SUPPER 09/21/19   Pincus Sanes, MD    Physical Exam: Vitals:   03/02/20 1300 03/02/20 1400 03/02/20 1424 03/02/20 1603  BP: (!) 117/53 (!) 123/47 117/73 (!) 142/69  Pulse: 93 93 92 100  Resp: 19 15 14 16   Temp:    98.1 F (36.7 C)  TempSrc:    Oral  SpO2: 100% 100% 99% 99%  Weight:      Height:         . General:  Appears calm and comfortable and is NAD while at rest . Eyes:  PERRL, EOMI, normal lids, iris . ENT:  Hard of hearing, grossly normal lips & tongue, mmm . Neck:  no LAD, masses or thyromegaly . Cardiovascular:  RRR, no r/g, 2/6 systolic murmur. No LE edema.  Marland Kitchen Respiratory:   CTA bilaterally with no wheezes/rales/rhonchi.  Normal respiratory effort. . Abdomen:  soft, NT, ND, NABS . Skin:  Marked L knee effusion and bruising along the entire left lower leg; small surgical laceration along the patella which is currently hemostatic but appears likely to bleed again easily       . Musculoskeletal:  As above - limited ROM of LLE due to pain . Psychiatric:  grossly normal mood and affect, speech fluent and appropriate, AOx3 . Neurologic:  CN 2-12 grossly intact, moves all extremities in coordinated fashion other than LLE due to pain    Radiological Exams on Admission: No results found.  EKG: not done   Labs on Admission: I have personally reviewed the available labs and imaging studies at the time of the admission.  Pertinent labs:   Na++ 131 Glucose 119 Albumin 3.3 Bili 1.5 WBC 5.9 Hgb 8.9; 12.9 on 3/2 Platelets 139 - stable   Assessment/Plan Principal Problem:   Prepatellar bursitis, left knee Active Problems:   Chronic a-fib   Essential hypertension   Hypothyroid   Dyslipidemia    Prepatellar bursitis -Patient with a remote fall while on  Xarelto, ongoing L knee pain/effusion -Seen by ortho and given doxy without improvement -Seen by ortho earlier this week with I&D and given Clinda without improvement -Sent by ortho for admission for OR washout this afternoon -Continue Clinda or antibiotics per orthopedics -NPO in anticipation of surgical repair  -Continue Xarelto post-operatively (or as per ortho) -Pain control with Robxain, Vicodin, and Morphine prn -She is admitted, as she is likely to need ongoing IV antibiotics post-operatively given her outpatient treatment failure  Afib -Rate controlled with Cardizem -Continue Xarelto  HTN -Continue Cardizem  HLD -Continue Pravachol  Hypothyroidism -Continue Synthroid at current dose for now   Note: This patient has been tested and is negative for the novel coronavirus COVID-19.  DVT prophylaxis:  Xarelto when approved by orthopedics Code Status:  DNR - confirmed with patient/family Family Communication: Daughter was present throughout evaluation Disposition Plan:  Home once clinically improved Consults called: Orthopedics Admission status: Admit - It is my clinical opinion that admission to INPATIENT is reasonable and necessary because of the expectation that this patient will require hospital care that crosses at least 2 midnights to treat this condition based on the medical complexity of the problems presented.  Given the aforementioned information, the predictability of an adverse outcome is felt to be significant.    Jonah Blue MD [facility]   How to contact the [facility] Attending or Consulting provider 7A - 7P or covering provider during after hours 7P -7A, for this patient?  1. Check the care team in [facility] and look for a) attending/consulting [facility] provider listed and b) the [facility] team listed 2. Log into [email or web address] and use [facility]'s universal password to access. If you do not have the password, please contact the hospital operator. 3. Locate the [facility]  provider you are looking for under [facility] and page to a number that you can be directly reached.  4. If you still have difficulty reaching the provider, please page the [facility] (Director on Call) for the Hospitalists listed on amion for assistance.   03/02/2020, 5:32 PM

## 2020-03-02 NOTE — Anesthesia Postprocedure Evaluation (Signed)
Anesthesia Post Note  Patient: Grace Alvarado  Procedure(s) Performed: evacuation of left knee hematoma, possible wound vac placement (Left Knee)     Patient location during evaluation: PACU Anesthesia Type: General Level of consciousness: sedated Pain management: pain level controlled Vital Signs Assessment: post-procedure vital signs reviewed and stable Respiratory status: spontaneous breathing and respiratory function stable Cardiovascular status: stable Postop Assessment: no apparent nausea or vomiting Anesthetic complications: no    Last Vitals:  Vitals:   03/02/20 2055 03/02/20 2110  BP: 117/61 121/74  Pulse: (!) 104 96  Resp: 13 14  Temp: 36.5 C 36.6 C  SpO2: 99% 99%    Last Pain:  Vitals:   03/02/20 2110  TempSrc:   PainSc: 3                  SINGER,JAMES DANIEL

## 2020-03-02 NOTE — Progress Notes (Signed)
Patient back from OR, Vital signs stable. Patient denies pain at this time.

## 2020-03-02 NOTE — Progress Notes (Signed)
I have reviewed photographs and discussed this case with Dr. Denyse Amass.  Tentative plan is for medical admission and evaluation today for possible hematoma evacuation.  Please keep patient n.p.o.  Full evaluation and consult to follow.

## 2020-03-03 LAB — SODIUM, URINE, RANDOM: Sodium, Ur: <10 mmol/L

## 2020-03-03 LAB — CBC
HCT: 25.1 % — ABNORMAL LOW (ref 36.0–46.0)
Hemoglobin: 8.2 g/dL — ABNORMAL LOW (ref 12.0–15.0)
MCH: 33.2 pg (ref 26.0–34.0)
MCHC: 32.7 g/dL (ref 30.0–36.0)
MCV: 101.6 fL — ABNORMAL HIGH (ref 80.0–100.0)
Platelets: 145 10*3/uL — ABNORMAL LOW (ref 150–400)
RBC: 2.47 MIL/uL — ABNORMAL LOW (ref 3.87–5.11)
RDW: 15.9 % — ABNORMAL HIGH (ref 11.5–15.5)
WBC: 3.7 10*3/uL — ABNORMAL LOW (ref 4.0–10.5)
nRBC: 0 % (ref 0.0–0.2)

## 2020-03-03 LAB — BASIC METABOLIC PANEL
Anion gap: 9 (ref 5–15)
BUN: 14 mg/dL (ref 8–23)
CO2: 25 mmol/L (ref 22–32)
Calcium: 9.4 mg/dL (ref 8.9–10.3)
Chloride: 95 mmol/L — ABNORMAL LOW (ref 98–111)
Creatinine, Ser: 0.63 mg/dL (ref 0.44–1.00)
GFR calc Af Amer: >60 mL/min (ref >60)
GFR calc non Af Amer: >60 mL/min (ref >60)
Glucose, Bld: 198 mg/dL — ABNORMAL HIGH (ref 70–99)
Potassium: 4.7 mmol/L (ref 3.5–5.1)
Sodium: 129 mmol/L — ABNORMAL LOW (ref 135–145)

## 2020-03-03 LAB — OSMOLALITY, URINE: Osmolality, Ur: 355 mOsm/kg (ref 300–900)

## 2020-03-03 LAB — OSMOLALITY: Osmolality: 270 mOsm/kg — ABNORMAL LOW (ref 275–295)

## 2020-03-03 LAB — SODIUM
Sodium: 125 mmol/L — ABNORMAL LOW (ref 135–145)
Sodium: 125 mmol/L — ABNORMAL LOW (ref 135–145)

## 2020-03-03 MED ORDER — CHLORHEXIDINE GLUCONATE CLOTH 2 % EX PADS
6.0000 | MEDICATED_PAD | Freq: Every day | CUTANEOUS | Status: DC
  Administered 2020-03-03 – 2020-03-08 (×6): 6 via TOPICAL

## 2020-03-03 NOTE — Progress Notes (Signed)
  Subjective: Patient stable.  Pain controlled.  Better with the knee immobilizer off   Objective: Vital signs in last 24 hours: Temp:  [97.4 F (36.3 C)-98.2 F (36.8 C)] 97.9 F (36.6 C) (05/29 0828) Pulse Rate:  [85-104] 85 (05/29 0828) Resp:  [12-25] 18 (05/29 0828) BP: (99-142)/(47-78) 118/71 (05/29 0828) SpO2:  [88 %-100 %] 95 % (05/29 0828) FiO2 (%):  [28 %] 28 % (05/28 2136) Weight:  [60.3 kg] 60.3 kg (05/28 1734)  Intake/Output from previous day: 05/28 0701 - 05/29 0700 In: 1190 [P.O.:240; I.V.:800; IV Piggyback:150] Out: 760 [Urine:700; Drains:10; Blood:50] Intake/Output this shift: No intake/output data recorded.  Exam:  Dorsiflexion/Plantar flexion intact  Labs: Recent Labs    03/02/20 0853 03/03/20 0810  HGB 8.9* 8.2*   Recent Labs    03/02/20 0853 03/03/20 0810  WBC 5.9 3.7*  RBC 2.66* 2.47*  HCT 27.1* 25.1*  PLT 139* 145*   Recent Labs    03/02/20 0853  NA 131*  K 3.9  CL 98  CO2 25  BUN 13  CREATININE 0.63  GLUCOSE 119*  CALCIUM 9.2   No results for input(s): LABPT, INR in the last 72 hours.  Assessment/Plan: Plan at this time is weightbearing as tolerated in the knee immobilizer with physical therapy.  Hemovac drain removed this morning.  Remove Ace wrap and web roll tomorrow and then replace one 4 inch Ace wrap around the knee.  Anticipate discharge whenever patient is  stable with therapy.  She does live alone at home but has her daughter with her incisional wound VAC will need to come off on Wednesday.  Or Thursday.Marrianne Mood Dean 03/03/2020, 9:34 AM

## 2020-03-03 NOTE — Progress Notes (Addendum)
PROGRESS NOTE    Grace Alvarado  UXL:[id number] DOB: 07-Jul-1925 DOA: 03/02/2020 PCP: Pincus Sanes, MD   Brief Narrative:  HPI: Grace Alvarado is a 86 y.o. female with medical history significant of hypothyroidism; HTN; HLD; and afib on Xarelto presenting with left knee infection.  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  She moved the puzzle off the table and was carrying it when she lost her balance and landed on both knees.  She called 911 and seemed to be ok.  The left knee started swelling and so they came to the ER on May 19 and discharged her.  She followed up with Dr. Denyse Amass a few days ago later, on 5/21.  She was given an antibiotic for traumatic prepatellar bursitis.  It was getting worse and so they were seen again yesterday; she was given Clindamycin and discharged.  She followed up again and it got worse again and so she was sent here.  No fever.  ED Course: Fall 2 weeks ago, seen yesterday bo orthopedics for prepatellar bursitis.  Placed on antibiotics but more drainage.  Dr. August Saucer wants to take her to the OR this afternoon.  Hgb 8.9, down from 12.  On Xarelto for afib.  Assessment & Plan:   Principal Problem:   Prepatellar bursitis, left knee Active Problems:   Chronic a-fib   Essential hypertension   Hypothyroid   Dyslipidemia  Prepatellar bursitis: Status post evacuation of left hematoma and wound VAC placement.  Pain fairly controlled.  Management deferred to orthopedics.  Paroxysmal atrial fibrillation: In sinus rhythm.  Continue Cardizem and  Xarelto.  Acute hyponatremia: Sodium dropped to 129 today.  Check urine and serum osmolality and urine sodium.  Check sodium at noon and then every 6 hours.  Essential HTN:  -Controlled.  Continue Cardizem  HLD -Continue Pravachol  Hypothyroidism -Continue Synthroid at current dose for now  DVT prophylaxis: xarelto   Code Status: DNR  Family Communication: Daughter present at bedside.  Plan of care discussed with  patient in length and she verbalized understanding and agreed with it. Patient is from: Home Disposition Plan: To be determined Barriers to discharge: Continued management of the left knee prepatellar bursitis requiring frequent pain medications  Status is: Inpatient  Remains inpatient appropriate because:Ongoing active pain requiring inpatient pain management   Dispo: The patient is from: Home              Anticipated d/c is to: TBD              Anticipated d/c date is: 2 days              Patient currently is not medically stable to d/c.         Estimated body mass index is 23.56 kg/m as calculated from the following:   Height as of this encounter: 5\' 3"  (1.6 m).   Weight as of this encounter: 60.3 kg.      Nutritional status:               Consultants:   Orthopedics  Procedures:   Left knee evacuation  Antimicrobials:  Anti-infectives (From admission, onward)   Start     Dose/Rate Route Frequency Ordered Stop   03/03/20 2230  clindamycin (CLEOCIN) IVPB 600 mg     600 mg 100 mL/hr over 30 Minutes Intravenous Every 8 hours 03/02/20 2135 03/04/20 1429   03/02/20 1937  tobramycin (NEBCIN) powder  Status:  Discontinued  As needed 03/02/20 1937 03/02/20 2019   03/02/20 1130  clindamycin (CLEOCIN) IVPB 900 mg     900 mg 100 mL/hr over 30 Minutes Intravenous On call to O.R. 03/02/20 1123 03/02/20 1940   03/02/20 1130  clindamycin (CLEOCIN) IVPB 300 mg     300 mg 100 mL/hr over 30 Minutes Intravenous  Once 03/02/20 1109 03/02/20 1407         Subjective: Patient seen and examined.  Daughter at the bedside.  Still with left knee pain.  No other complaint.  Objective: Vitals:   03/02/20 2136 03/02/20 2344 03/03/20 0427 03/03/20 0828  BP:  103/61 107/60 118/71  Pulse:  91 89 85  Resp:  17 18 18   Temp:  98.2 F (36.8 C) 97.9 F (36.6 C) 97.9 F (36.6 C)  TempSrc:  Oral Oral Oral  SpO2: 100% 95% 95% 95%  Weight:      Height:         Intake/Output Summary (Last 24 hours) at 03/03/2020 1041 Last data filed at 03/03/2020 0400 Gross per 24 hour  Intake 1190 ml  Output 760 ml  Net 430 ml   Filed Weights   03/02/20 0843 03/02/20 1734  Weight: 60.3 kg 60.3 kg    Examination:  General exam: Appears calm and comfortable  Respiratory system: Clear to auscultation. Respiratory effort normal. Cardiovascular system: S1 & S2 heard, RRR. No JVD, murmurs, rubs, gallops or clicks. No pedal edema. Gastrointestinal system: Abdomen is nondistended, soft and nontender. No organomegaly or masses felt. Normal bowel sounds heard. Central nervous system: Alert and oriented. No focal neurological deficits. Extremities: Left knee IN wrap with wound VAC attached. Skin: No rashes, lesions or ulcers Psychiatry: Judgement and insight appear normal. Mood & affect appropriate.    Data Reviewed: I have personally reviewed following labs and imaging studies  CBC: Recent Labs  Lab 03/02/20 0853 03/03/20 0810  WBC 5.9 3.7*  NEUTROABS 4.3  --   HGB 8.9* 8.2*  HCT 27.1* 25.1*  MCV 101.9* 101.6*  PLT 139* 145*   Basic Metabolic Panel: Recent Labs  Lab 03/02/20 0853 03/03/20 0810  NA 131* 129*  K 3.9 4.7  CL 98 95*  CO2 25 25  GLUCOSE 119* 198*  BUN 13 14  CREATININE 0.63 0.63  CALCIUM 9.2 9.4   GFR: Estimated Creatinine Clearance: 37.1 mL/min (by C-G formula based on SCr of 0.63 mg/dL). Liver Function Tests: Recent Labs  Lab 03/02/20 0853  AST 22  ALT 21  ALKPHOS 54  BILITOT 1.5*  PROT 6.5  ALBUMIN 3.3*   No results for input(s): LIPASE, AMYLASE in the last 168 hours. No results for input(s): AMMONIA in the last 168 hours. Coagulation Profile: No results for input(s): INR, PROTIME in the last 168 hours. Cardiac Enzymes: No results for input(s): CKTOTAL, CKMB, CKMBINDEX, TROPONINI in the last 168 hours. BNP (last 3 results) No results for input(s): PROBNP in the last 8760 hours. HbA1C: No results for  input(s): HGBA1C in the last 72 hours. CBG: No results for input(s): GLUCAP in the last 168 hours. Lipid Profile: No results for input(s): CHOL, HDL, LDLCALC, TRIG, CHOLHDL, LDLDIRECT in the last 72 hours. Thyroid Function Tests: No results for input(s): TSH, T4TOTAL, FREET4, T3FREE, THYROIDAB in the last 72 hours. Anemia Panel: No results for input(s): VITAMINB12, FOLATE, FERRITIN, TIBC, IRON, RETICCTPCT in the last 72 hours. Sepsis Labs: No results for input(s): PROCALCITON, LATICACIDVEN in the last 168 hours.  Recent Results (from the past 240 hour(s))  Wound culture     Status: None (Preliminary result)   Collection Time: 03/01/20  4:50 PM   Specimen: Wound  Result Value Ref Range Status   MICRO NUMBER: [id number]  Preliminary   SPECIMEN QUALITY: Adequate  Preliminary   SOURCE: WOUND (SITE NOT SPECIFIED)  Preliminary   STATUS: PRELIMINARY  Preliminary   GRAM STAIN:   Preliminary    No white blood cells seen No epithelial cells seen No organisms seen   RESULT: No Growth  Preliminary  SARS Coronavirus 2 by RT PCR (hospital order, performed in [facility] hospital lab) Nasopharyngeal Nasopharyngeal Swab     Status: None   Collection Time: 03/02/20 10:55 AM   Specimen: Nasopharyngeal Swab  Result Value Ref Range Status   SARS Coronavirus 2 NEGATIVE NEGATIVE Final    Comment: (NOTE) SARS-CoV-2 target nucleic acids are NOT DETECTED. The SARS-CoV-2 RNA is generally detectable in upper and lower respiratory specimens during the acute phase of infection. The lowest concentration of SARS-CoV-2 viral copies this assay can detect is 250 copies / mL. A negative result does not preclude SARS-CoV-2 infection and should not be used as the sole basis for treatment or other patient management decisions.  A negative result may occur with improper specimen collection / handling, submission of specimen other than nasopharyngeal swab, presence of viral mutation(s) within the areas targeted by this  assay, and inadequate number of viral copies (<250 copies / mL). A negative result must be combined with clinical observations, patient history, and epidemiological information. Fact Sheet for Patients:   StrictlyIdeas.no Fact Sheet for Healthcare Providers: BankingDealers.co.za This test is not yet approved or cleared  by the [place] FDA and has been authorized for detection and/or diagnosis of SARS-CoV-2 by FDA under an Emergency Use Authorization (EUA).  This EUA will remain in effect (meaning this test can be used) for the duration of the COVID-19 declaration under Section 564(b)(1) of the Act, 21 U.S.C. section 360bbb-3(b)(1), unless the authorization is terminated or revoked sooner. Performed at [facility], [place] [address], [place], [place] [address]   Aerobic/Anaerobic Culture (surgical/deep wound)     Status: None (Preliminary result)   Collection Time: 03/02/20  7:35 PM   Specimen: Soft Tissue, Other  Result Value Ref Range Status   Specimen Description KNEE  Final   Special Requests PRE PATELLA HEMATOMA LEFT  Final   Gram Stain   Final    RARE WBC PRESENT, PREDOMINANTLY PMN RARE GRAM POSITIVE COCCI Performed at [facility], [place] [address] [place], [place] [address]    Culture PENDING  Incomplete   Report Status PENDING  Incomplete      Radiology Studies: No results found.  Scheduled Meds: . acetaminophen  1,000 mg Oral Q6H  . chlorhexidine  60 mL Topical Once  . diltiazem  120 mg Oral BID  . docusate sodium  100 mg Oral BID  . levothyroxine  75 mcg Oral QAC breakfast  . povidone-iodine  2 application Topical Once  . pravastatin  10 mg Oral Daily  . rivaroxaban  20 mg Oral Q supper   Continuous Infusions: . clindamycin (CLEOCIN) IV    . methocarbamol (ROBAXIN) IV       LOS: 1 day   Time spent: 35 minutes   Darliss Cheney, MD [facility]  03/03/2020, 10:41 AM   To contact the  attending provider between 7A-7P or the covering provider during after hours 7P-7A, please log into the web site [email or web address].

## 2020-03-03 NOTE — Evaluation (Signed)
Physical Therapy Evaluation Patient Details Name: Grace Alvarado MRN: [id number] DOB: [birth year] Today's Date: 03/03/2020   History of Present Illness  Pt is a 86 y/o female admitted secondary to a L knee hematoma after sustaining a fall at home several day prior. Pt is now s/p hematoma evacuation with wound VAC placement. PMH including but not limited to a-fib, HTN and HLD.    Clinical Impression  Pt presented supine in bed with HOB elevated, awake and willing to participate in therapy session. Prior to initial injury pt was fiercly independent with all functional mobility and ADLs. She lives alone in a single level apartment with a level entry. It is actually handicap accessible according her to her daughter. She is able to have 24/7 supervision upon d/c; however, she and her daughter are concerned regarding the level of physical assistance pt might need as pt's daughters both have "bad backs". There is potential for grandchildren to be able to assist, but they all work during the day. Family is planning to further discuss today and tomorrow to decide on d/c disposition. At the time of evaluation, pt required min A for bed mobility, mod A for transfers and min A to ambulate a very short distance with RW. She was very fearful of falling; however, at end of session she seemed quite pleased with what she was able to do. Hopeful that she will progress well with mobility and further therapy services. Pt would continue to benefit from skilled physical therapy services at this time while admitted and after d/c to address the below listed limitations in order to improve overall safety and independence with functional mobility.     Follow Up Recommendations SNF;Supervision/Assistance - 24 hour;Other (comment)(with potential to progress to HHPT with assist from family)    Equipment Recommendations  Rolling walker with 5" wheels;3in1 (PT);Wheelchair (measurements PT);Wheelchair cushion (measurements PT);Other  (comment)(w/c with elevating leg rests)    Recommendations for Other Services       Precautions / Restrictions Precautions Precautions: Fall Precaution Comments: wound VAC Required Braces or Orthoses: Knee Immobilizer - Left Knee Immobilizer - Left: On at all times;On when out of bed or walking;Other (comment)(pt is not allowed any knee flexion at all) Restrictions Weight Bearing Restrictions: Yes LLE Weight Bearing: Weight bearing as tolerated Other Position/Activity Restrictions: with KI donned      Mobility  Bed Mobility Overal bed mobility: Needs Assistance Bed Mobility: Supine to Sit;Sit to Supine     Supine to sit: Min assist Sit to supine: Min assist   General bed mobility comments: increased time and effort, use of bed pads to position pt's hips at EOB, cueing for sequencing; assistance needed for movement of L LE off of and back onto bed  Transfers Overall transfer level: Needs assistance Equipment used: Rolling walker (2 wheeled) Transfers: Sit to/from Stand Sit to Stand: Mod assist;From elevated surface         General transfer comment: bed in elevated position (pt stating her bed at home is high), cueing for safe hand placement, demonstration for technique; assistance needed to power into standing and achieve full upright position  Ambulation/Gait Ambulation/Gait assistance: Min assist Gait Distance (Feet): 2 Feet Assistive device: Rolling walker (2 wheeled) Gait Pattern/deviations: Step-to pattern;Decreased step length - right;Decreased stance time - left;Decreased stride length;Decreased weight shift to left;Trunk flexed Gait velocity: decreased   General Gait Details: pt initially taking side steps at EOB, then able to progress to forward steps and backward steps; min A for RW management  and safety/stability  Stairs            Wheelchair Mobility    Modified Rankin (Stroke Patients Only)       Balance Overall balance assessment: Needs  assistance Sitting-balance support: Feet supported Sitting balance-Leahy Scale: Good     Standing balance support: During functional activity;Bilateral upper extremity supported;Single extremity supported Standing balance-Leahy Scale: Poor                               Pertinent Vitals/Pain Pain Assessment: Faces Faces Pain Scale: Hurts little more Pain Location: L knee Pain Descriptors / Indicators: Sore;Guarding Pain Intervention(s): Monitored during session;Repositioned    Home Living Family/patient expects to be discharged to:: Private residence Living Arrangements: Alone Available Help at Discharge: Family;Available 24 hours/day Type of Home: Apartment Home Access: Level entry     Home Layout: One level Home Equipment: Shower seat;Cane - single point;Walker - standard      Prior Function Level of Independence: Independent         Comments: up until this recent fall, pt was completely independent     Hand Dominance        Extremity/Trunk Assessment   Upper Extremity Assessment Upper Extremity Assessment: Overall WFL for tasks assessed    Lower Extremity Assessment Lower Extremity Assessment: Generalized weakness;LLE deficits/detail LLE Deficits / Details: wound VAC in place; KI donned throughout; knee maintained in full extension; pt required assistance for movement of LE for functional mobility but able to accept weight once in standing LLE: Unable to fully assess due to immobilization       Communication   Communication: No difficulties  Cognition Arousal/Alertness: Awake/alert Behavior During Therapy: Anxious(fearful of falling) Overall Cognitive Status: Within Functional Limits for tasks assessed                                        General Comments      Exercises General Exercises - Lower Extremity Ankle Circles/Pumps: AROM;Both;10 reps;Supine   Assessment/Plan    PT Assessment Patient needs continued PT  services  PT Problem List Decreased range of motion;Decreased strength;Decreased activity tolerance;Decreased balance;Decreased mobility;Decreased coordination;Decreased knowledge of use of DME;Decreased safety awareness;Decreased knowledge of precautions;Pain       PT Treatment Interventions DME instruction;Gait training;Stair training;Functional mobility training;Therapeutic activities;Therapeutic exercise;Balance training;Neuromuscular re-education;Patient/family education    PT Goals (Current goals can be found in the Care Plan section)  Acute Rehab PT Goals Patient Stated Goal: decrease pain; return to independence  PT Goal Formulation: With patient/family Time For Goal Achievement: 03/17/20 Potential to Achieve Goals: Good    Frequency Min 3X/week   Barriers to discharge        Co-evaluation               AM-PAC PT "6 Clicks" Mobility  Outcome Measure Help needed turning from your back to your side while in a flat bed without using bedrails?: A Little Help needed moving from lying on your back to sitting on the side of a flat bed without using bedrails?: A Little Help needed moving to and from a bed to a chair (including a wheelchair)?: A Little Help needed standing up from a chair using your arms (e.g., wheelchair or bedside chair)?: A Lot Help needed to walk in hospital room?: A Little Help needed climbing 3-5 steps with a railing? :  A Lot 6 Click Score: 16    End of Session Equipment Utilized During Treatment: Gait belt Activity Tolerance: Patient tolerated treatment well Patient left: in bed;with call bell/phone within reach;with family/visitor present Nurse Communication: Mobility status PT Visit Diagnosis: Other abnormalities of gait and mobility (R26.89);Pain Pain - Right/Left: Left Pain - part of body: Knee    Time: 0109-3235 PT Time Calculation (min) (ACUTE ONLY): 44 min   Charges:   PT Evaluation $PT Eval Moderate Complexity: 1 Mod PT  Treatments $Gait Training: 8-22 mins $Therapeutic Activity: 8-22 mins        Grace Alvarado, DPT  Acute Rehabilitation Services Pager [phone] Office [phone]    Alessandra Bevels Grace Alvarado 03/03/2020, 12:26 PM

## 2020-03-03 NOTE — Op Note (Signed)
[name], [name] MEDICAL RECORD [id number] ACCOUNT [id number] DATE OF BIRTH:05-Mar-1925 FACILITY: [facility] LOCATION: [facility] PHYSICIAN:Angelgabriel Willmore Diamantina Providence, MD  OPERATIVE REPORT  DATE OF PROCEDURE:  03/02/2020  PREOPERATIVE DIAGNOSIS:  Left knee prepatellar bursa hematoma.  POSTOPERATIVE DIAGNOSIS:  Left knee prepatellar bursa hematoma.  PROCEDURES:   1.  Left knee incision and drainage. 2.  Hematoma evacuation. 3.  Placement of Hemovac.   4.  Placement of tobramycin antibiotic powder. 5.  Placement of incisional wound VAC.  SURGEON:  Cammy Copa, MD  ASSISTANT:  Karenann Cai, PA.  INDICATIONS:  The patient is a 86 year old patient sustained a fall on her left knee 2 weeks ago.  She is on Xarelto.  She developed a hematoma, which has become painful.  Had 1 attempt at aspiration, another attempt at I and D, which was not successful.   She presents now for operative management after explanation of risks and benefits.  PROCEDURE IN DETAIL:  The patient was brought to the operating room where a general anesthetic was induced.  Preoperative antibiotics administered.  Timeout was called.  The left knee was prepped with ChloraPrep solution and draped in a sterile manner.   The patient did have a small 1.5 cm fistula around the prepatellar bursal region.  This area was excised with a longitudinal incision, which was about 8 cm.  Skin and subcutaneous tissue were sharply divided.  A significant amount of hematoma was  present.  Plane was developed between the hematoma and the undersurface of the skin, as well as the anterior surface of the patella.  Hematoma was evacuated.  Curettage was performed along with a rongeur.  About 3 L of irrigating solution was then used  after hematoma evacuation.  Some of the skin was removed because of redundancy due to the pressure that the skin was under.  Following removal of hematoma and thorough irrigation, a Hemovac was placed.  Tobramycin powder was  placed.  The skin was then  loosely reapproximated using 2-0 nylon sutures.  An incisional wound VAC then applied.  Bulky Ace wrap and knee immobilizer applied.  Luke's assistance was required at all times during the case for retraction, opening and closing and tissue management.   His assistance was a medical necessity.  The patient will be weightbearing as tolerated in the knee immobilizer, starting with physical therapy tomorrow.  We will pull the Hemovac drain tomorrow.  Incisional wound VAC will stay on for 5 days total with  Prevena being utilized upon discharge, likely Sunday.  She will follow up on Wednesday or Thursday next week just to inspect the incision.  VN/NUANCE  D:03/02/2020 T:03/03/2020 JOB:[id number]

## 2020-03-03 NOTE — Progress Notes (Signed)
   03/03/20 0400  Intake (mL)  P.O. 240 mL  Urine Characteristics  Bladder Scan Volume (mL) 512 mL  Intermittent/Straight Cath (mL) 700 mL

## 2020-03-03 NOTE — Plan of Care (Signed)
  Problem: Education: Goal: Knowledge of General Education information will improve Description: Including pain rating scale, medication(s)/side effects and non-pharmacologic comfort measures Outcome: Progressing   Problem: Clinical Measurements: Goal: Will remain free from infection Outcome: Progressing   Problem: Nutrition: Goal: Adequate nutrition will be maintained Outcome: Progressing   Problem: Elimination: Goal: Will not experience complications related to bowel motility Outcome: Progressing   Problem: Pain Managment: Goal: General experience of comfort will improve Outcome: Progressing   

## 2020-03-04 LAB — CBC WITH DIFFERENTIAL/PLATELET
Abs Immature Granulocytes: 0.03 10*3/uL (ref 0.00–0.07)
Basophils Absolute: 0 10*3/uL (ref 0.0–0.1)
Basophils Relative: 0 %
Eosinophils Absolute: 0 10*3/uL (ref 0.0–0.5)
Eosinophils Relative: 0 %
HCT: 24.5 % — ABNORMAL LOW (ref 36.0–46.0)
Hemoglobin: 8 g/dL — ABNORMAL LOW (ref 12.0–15.0)
Immature Granulocytes: 0 %
Lymphocytes Relative: 8 %
Lymphs Abs: 0.7 10*3/uL (ref 0.7–4.0)
MCH: 33.1 pg (ref 26.0–34.0)
MCHC: 32.7 g/dL (ref 30.0–36.0)
MCV: 101.2 fL — ABNORMAL HIGH (ref 80.0–100.0)
Monocytes Absolute: 0.6 10*3/uL (ref 0.1–1.0)
Monocytes Relative: 7 %
Neutro Abs: 7.4 10*3/uL (ref 1.7–7.7)
Neutrophils Relative %: 85 %
Platelets: 174 10*3/uL (ref 150–400)
RBC: 2.42 MIL/uL — ABNORMAL LOW (ref 3.87–5.11)
RDW: 15.9 % — ABNORMAL HIGH (ref 11.5–15.5)
WBC: 8.7 10*3/uL (ref 4.0–10.5)
nRBC: 0 % (ref 0.0–0.2)

## 2020-03-04 LAB — BASIC METABOLIC PANEL
Anion gap: 5 (ref 5–15)
BUN: 14 mg/dL (ref 8–23)
CO2: 25 mmol/L (ref 22–32)
Calcium: 8.8 mg/dL — ABNORMAL LOW (ref 8.9–10.3)
Chloride: 95 mmol/L — ABNORMAL LOW (ref 98–111)
Creatinine, Ser: 0.63 mg/dL (ref 0.44–1.00)
GFR calc Af Amer: >60 mL/min (ref >60)
GFR calc non Af Amer: >60 mL/min (ref >60)
Glucose, Bld: 147 mg/dL — ABNORMAL HIGH (ref 70–99)
Potassium: 4.9 mmol/L (ref 3.5–5.1)
Sodium: 125 mmol/L — ABNORMAL LOW (ref 135–145)

## 2020-03-04 LAB — WOUND CULTURE
MICRO NUMBER:: 10527434
RESULT:: NO GROWTH
SPECIMEN QUALITY:: ADEQUATE

## 2020-03-04 LAB — SODIUM
Sodium: 126 mmol/L — ABNORMAL LOW (ref 135–145)
Sodium: 126 mmol/L — ABNORMAL LOW (ref 135–145)

## 2020-03-04 MED ORDER — CALCIUM CARBONATE ANTACID 500 MG PO CHEW
1.0000 | CHEWABLE_TABLET | Freq: Two times a day (BID) | ORAL | Status: DC
  Administered 2020-03-04 – 2020-03-08 (×9): 200 mg via ORAL
  Filled 2020-03-04 (×9): qty 1

## 2020-03-04 MED ORDER — POLYETHYLENE GLYCOL 3350 17 G PO PACK
17.0000 g | PACK | Freq: Every day | ORAL | Status: DC
  Administered 2020-03-04 – 2020-03-06 (×3): 17 g via ORAL
  Filled 2020-03-04 (×4): qty 1

## 2020-03-04 MED ORDER — SODIUM CHLORIDE 0.9 % IV SOLN: INTRAVENOUS | Status: DC

## 2020-03-04 NOTE — Progress Notes (Addendum)
PROGRESS NOTE    Grace Alvarado  GDJ:[id number] DOB: 08/12/1925 DOA: 03/02/2020 PCP: Binnie Rail, MD   Brief Narrative:  HPI: Grace Alvarado is a 86 y.o. female with medical history significant of hypothyroidism; HTN; HLD; and afib on Xarelto presented with left knee pain/ infection.   She fell off the chair 2 weeks prior to presentation. She called 911 and seemed to be ok.  The left knee started swelling and so they came to the ER on May 19 and she was discharged her.  She followed up with Dr. Georgina Snell later on 5/21.  She was given an antibiotic for traumatic prepatellar bursitis.  It was getting worse and so they were seen again on 03/01/2020; she was given Clindamycin and discharged.  She followed up again and it got worse again and so she was sent to ED on 03/02/2020.  No fever.  Upon arrival to ED, case was discussed between ED physician and orthopedics and Dr. Marlou Sa of orthopedics recommended admission as he plans to do incision and drainage in the OR.  Patient was then admitted under hospital service.  Assessment & Plan:   Principal Problem:   Prepatellar bursitis, left knee Active Problems:   Chronic a-fib   Essential hypertension   Hypothyroid   Dyslipidemia  Prepatellar bursitis: Status post evacuation of left hematoma and wound VAC placement.  Pain fairly controlled.  Management deferred to orthopedics.  Paroxysmal atrial fibrillation: In sinus rhythm.  Continue Cardizem and  Xarelto.  Acute hyponatremia: Sodium dropped to 125 today.  Based on the studies, this does not seem to be SIADH.  May likely be due to hypovolemia.  Will start on normal saline at 75 cc/h and check sodium every 8 hours.    Acute urinary retention: Patient could not pass urine and needed straight cath on the night of 03/02/2020 and then again she was retaining in the afternoon of 03/03/2020 so Foley catheter was inserted.  Essential HTN:  -Controlled.  Continue Cardizem  HLD -Continue  Pravachol  Hypothyroidism -Continue Synthroid at current dose for now  Constipation: Patient requests something for constipation.  She is on daily scheduled Colace and as needed Dulcolax suppository and MiraLAX.  We will switch MiraLAX to daily.  DVT prophylaxis: xarelto   Code Status: DNR  Family Communication: Daughter present at bedside.  Plan of care discussed with patient in length and she verbalized understanding and agreed with it. Patient is from: Home Disposition Plan: To SNF. Barriers to discharge: Placement.  Status is: Inpatient  Remains inpatient appropriate because:Ongoing active pain requiring inpatient pain management   Dispo: The patient is from: Home              Anticipated d/c is to: SNF              Anticipated d/c date is: 2 days              Patient currently is medically stable to d/c.         Estimated body mass index is 23.56 kg/m as calculated from the following:   Height as of this encounter: 5\' 3"  (1.6 m).   Weight as of this encounter: 60.3 kg.      Nutritional status:               Consultants:   Orthopedics  Procedures:   Left knee evacuation  Antimicrobials:  Anti-infectives (From admission, onward)   Start     Dose/Rate Route Frequency Ordered Stop  03/03/20 2230  clindamycin (CLEOCIN) IVPB 600 mg     600 mg 100 mL/hr over 30 Minutes Intravenous Every 8 hours 03/02/20 2135 03/04/20 0737   03/02/20 1937  tobramycin (NEBCIN) powder  Status:  Discontinued       As needed 03/02/20 1937 03/02/20 2019   03/02/20 1130  clindamycin (CLEOCIN) IVPB 900 mg     900 mg 100 mL/hr over 30 Minutes Intravenous On call to O.R. 03/02/20 1123 03/02/20 1940   03/02/20 1130  clindamycin (CLEOCIN) IVPB 300 mg     300 mg 100 mL/hr over 30 Minutes Intravenous  Once 03/02/20 1109 03/02/20 1407         Subjective: Seen and examined.  Daughter at the bedside.  Left knee pain is improving.  Complains of constipation.  No other  complaint.  Objective: Vitals:   03/03/20 1630 03/03/20 2140 03/03/20 2314 03/04/20 0636  BP: 115/71 (!) 105/55 104/71 119/72  Pulse: (!) 109 91 92 92  Resp: 18  18 18   Temp: (!) 97.5 F (36.4 C)  98.5 F (36.9 C) 97.7 F (36.5 C)  TempSrc: Oral  Oral Oral  SpO2: 97%  97% 94%  Weight:      Height:        Intake/Output Summary (Last 24 hours) at 03/04/2020 1024 Last data filed at 03/04/2020 03/06/2020 Gross per 24 hour  Intake 240 ml  Output 1500 ml  Net -1260 ml   Filed Weights   03/02/20 0843 03/02/20 1734  Weight: 60.3 kg 60.3 kg    Examination:  General exam: Appears calm and comfortable  Respiratory system: Clear to auscultation. Respiratory effort normal. Cardiovascular system: S1 & S2 heard, RRR. No JVD, murmurs, rubs, gallops or clicks. No pedal edema. Gastrointestinal system: Abdomen is nondistended, soft and nontender. No organomegaly or masses felt. Normal bowel sounds heard. Central nervous system: Alert and oriented. No focal neurological deficits. Extremities: Left knee in wrap.  Direct visualization deferred to orthopedics. Skin: No rashes, lesions or ulcers.  Psychiatry: Judgement and insight appear normal. Mood & affect appropriate.   Data Reviewed: I have personally reviewed following labs and imaging studies  CBC: Recent Labs  Lab 03/02/20 0853 03/03/20 0810 03/04/20 0646  WBC 5.9 3.7* 8.7  NEUTROABS 4.3  --  7.4  HGB 8.9* 8.2* 8.0*  HCT 27.1* 25.1* 24.5*  MCV 101.9* 101.6* 101.2*  PLT 139* 145* 174   Basic Metabolic Panel: Recent Labs  Lab 03/02/20 0853 03/03/20 0810 03/03/20 1236 03/03/20 1852 03/04/20 0646  NA 131* 129* 125* 125* 125*  K 3.9 4.7  --   --  4.9  CL 98 95*  --   --  95*  CO2 25 25  --   --  25  GLUCOSE 119* 198*  --   --  147*  BUN 13 14  --   --  14  CREATININE 0.63 0.63  --   --  0.63  CALCIUM 9.2 9.4  --   --  8.8*   GFR: Estimated Creatinine Clearance: 37.1 mL/min (by C-G formula based on SCr of 0.63  mg/dL). Liver Function Tests: Recent Labs  Lab 03/02/20 0853  AST 22  ALT 21  ALKPHOS 54  BILITOT 1.5*  PROT 6.5  ALBUMIN 3.3*   No results for input(s): LIPASE, AMYLASE in the last 168 hours. No results for input(s): AMMONIA in the last 168 hours. Coagulation Profile: No results for input(s): INR, PROTIME in the last 168 hours. Cardiac Enzymes: No results  for input(s): CKTOTAL, CKMB, CKMBINDEX, TROPONINI in the last 168 hours. BNP (last 3 results) No results for input(s): PROBNP in the last 8760 hours. HbA1C: No results for input(s): HGBA1C in the last 72 hours. CBG: No results for input(s): GLUCAP in the last 168 hours. Lipid Profile: No results for input(s): CHOL, HDL, LDLCALC, TRIG, CHOLHDL, LDLDIRECT in the last 72 hours. Thyroid Function Tests: No results for input(s): TSH, T4TOTAL, FREET4, T3FREE, THYROIDAB in the last 72 hours. Anemia Panel: No results for input(s): VITAMINB12, FOLATE, FERRITIN, TIBC, IRON, RETICCTPCT in the last 72 hours. Sepsis Labs: No results for input(s): PROCALCITON, LATICACIDVEN in the last 168 hours.  Recent Results (from the past 240 hour(s))  Wound culture     Status: None (Preliminary result)   Collection Time: 03/01/20  4:50 PM   Specimen: Wound  Result Value Ref Range Status   MICRO NUMBER: [id number]  Preliminary   SPECIMEN QUALITY: Adequate  Preliminary   SOURCE: WOUND (SITE NOT SPECIFIED)  Preliminary   STATUS: PRELIMINARY  Preliminary   GRAM STAIN:   Preliminary    No white blood cells seen No epithelial cells seen No organisms seen   RESULT: No Growth  Preliminary  SARS Coronavirus 2 by RT PCR (hospital order, performed in [facility] hospital lab) Nasopharyngeal Nasopharyngeal Swab     Status: None   Collection Time: 03/02/20 10:55 AM   Specimen: Nasopharyngeal Swab  Result Value Ref Range Status   SARS Coronavirus 2 NEGATIVE NEGATIVE Final    Comment: (NOTE) SARS-CoV-2 target nucleic acids are NOT DETECTED. The SARS-CoV-2  RNA is generally detectable in upper and lower respiratory specimens during the acute phase of infection. The lowest concentration of SARS-CoV-2 viral copies this assay can detect is 250 copies / mL. A negative result does not preclude SARS-CoV-2 infection and should not be used as the sole basis for treatment or other patient management decisions.  A negative result may occur with improper specimen collection / handling, submission of specimen other than nasopharyngeal swab, presence of viral mutation(s) within the areas targeted by this assay, and inadequate number of viral copies (<250 copies / mL). A negative result must be combined with clinical observations, patient history, and epidemiological information. Fact Sheet for Patients:   BoilerBrush.com.cy Fact Sheet for Healthcare Providers: https://pope.com/ This test is not yet approved or cleared  by the [place] FDA and has been authorized for detection and/or diagnosis of SARS-CoV-2 by FDA under an Emergency Use Authorization (EUA).  This EUA will remain in effect (meaning this test can be used) for the duration of the COVID-19 declaration under Section 564(b)(1) of the Act, 21 U.S.C. section 360bbb-3(b)(1), unless the authorization is terminated or revoked sooner. Performed at [facility], [address], [place], [place] [address]   Aerobic/Anaerobic Culture (surgical/deep wound)     Status: None (Preliminary result)   Collection Time: 03/02/20  7:35 PM   Specimen: Soft Tissue, Other  Result Value Ref Range Status   Specimen Description KNEE  Final   Special Requests PRE PATELLA HEMATOMA LEFT  Final   Gram Stain   Final    RARE WBC PRESENT, PREDOMINANTLY PMN RARE GRAM POSITIVE COCCI    Culture   Final    NO GROWTH 1 DAY Performed at [facility], [address], [place], [place] [address]    Report Status PENDING  Incomplete      Radiology  Studies: No results found.  Scheduled Meds: . chlorhexidine  60  mL Topical Once  . Chlorhexidine Gluconate Cloth  6 each Topical Daily  . diltiazem  120 mg Oral BID  . docusate sodium  100 mg Oral BID  . levothyroxine  75 mcg Oral QAC breakfast  . polyethylene glycol  17 g Oral Daily  . povidone-iodine  2 application Topical Once  . pravastatin  10 mg Oral Daily  . rivaroxaban  20 mg Oral Q supper   Continuous Infusions: . sodium chloride 75 mL/hr at 03/04/20 0848  . methocarbamol (ROBAXIN) IV       LOS: 2 days   Time spent: 29 minutes    Hughie Closs, MD [facility]  03/04/2020, 10:24 AM   To contact the attending provider between 7A-7P or the covering provider during after hours 7P-7A, please log into the web site [email or web address].

## 2020-03-04 NOTE — Progress Notes (Signed)
  Subjective: Patient is POD2 s/p left knee hematoma evacuation with wound vac placement. Doing well overall, feels better with drain out.  No major complaints today.    Objective: Vital signs in last 24 hours: Temp:  [97.5 F (36.4 C)-98.5 F (36.9 C)] 97.7 F (36.5 C) (05/30 0636) Pulse Rate:  [91-109] 92 (05/30 0636) Resp:  [18] 18 (05/30 0636) BP: (104-119)/(55-72) 119/72 (05/30 0636) SpO2:  [94 %-97 %] 94 % (05/30 0636)  Intake/Output from previous day: 05/29 0701 - 05/30 0700 In: 240 [P.O.:240] Out: 1500 [Urine:1500] Intake/Output this shift: No intake/output data recorded.  Exam:  Left foot warm and well-perfused.  Wound vac in place with suction intact.   Labs: Recent Labs    03/02/20 0853 03/03/20 0810 03/04/20 0646  HGB 8.9* 8.2* 8.0*   Recent Labs    03/03/20 0810 03/04/20 0646  WBC 3.7* 8.7  RBC 2.47* 2.42*  HCT 25.1* 24.5*  PLT 145* 174   Recent Labs    03/03/20 0810 03/03/20 1236 03/03/20 1852 03/04/20 0646  NA 129*   < > 125* 125*  K 4.7  --   --  4.9  CL 95*  --   --  95*  CO2 25  --   --  25  BUN 14  --   --  14  CREATININE 0.63  --   --  0.63  GLUCOSE 198*  --   --  147*  CALCIUM 9.4  --   --  8.8*   < > = values in this interval not displayed.   No results for input(s): LABPT, INR in the last 72 hours.  Assessment/Plan: Ace-wrap and webril removed.  New ace-wrap applied.  Suction intact.  No significant complaints.  Continue WBAT in knee immobilizer. Continue Xarelto.  Okay for discharge home when patient mobilizes easily in PT.  D/c wound vac on Wednesday.  F/u with Dr. August Saucer in clinic ~1 week after procedure.     Joycie Peek Selby Slovacek 03/04/2020, 12:23 PM

## 2020-03-04 NOTE — Discharge Instructions (Signed)

## 2020-03-04 NOTE — Progress Notes (Signed)
Physical Therapy Treatment Patient Details Name: Grace Alvarado MRN: [id number] DOB: 1924-10-15 Today's Date: 03/04/2020    History of Present Illness Pt is a 86 y/o female admitted secondary to a L knee hematoma after sustaining a fall at home several day prior. Pt is now s/p hematoma evacuation with wound VAC placement. PMH including but not limited to a-fib, HTN and HLD.    PT Comments    Continuing work on functional mobility and activity tolerance;  Able to porgress to in-room ambulation with RW today, with cues for sequence; good progress compared to yesterday -- with good participation, good potential to improve; as a nonogenarian, she may be medically complex enough to qualify for CIR -- will ask Admissions Coord to weigh in; at this point, I still favor post-acute rehab to maximize independence and safety with mobility prior to dc home   Follow Up Recommendations  SNF;Supervision/Assistance - 24 hour;Other (comment);CIR(with potential to progress to HHPT with assist from family)     Equipment Recommendations  Rolling walker with 5" wheels;3in1 (PT);Wheelchair (measurements PT);Wheelchair cushion (measurements PT);Other (comment)(w/c with elevating leg rests)    Recommendations for Other Services       Precautions / Restrictions Precautions Precautions: Fall Precaution Comments: wound VAC Required Braces or Orthoses: Knee Immobilizer - Left Knee Immobilizer - Left: On at all times;On when out of bed or walking;Other (comment)(pt is not allowed any knee flexion at all) Restrictions LLE Weight Bearing: Weight bearing as tolerated Other Position/Activity Restrictions: with KI donned    Mobility  Bed Mobility Overal bed mobility: Needs Assistance Bed Mobility: Supine to Sit     Supine to sit: Min assist     General bed mobility comments: Cues for technique; good use of stronger RLE to half-bridge to EOB; min assist to support LLE coming off of the bed; incr time and  effort  Transfers Overall transfer level: Needs assistance Equipment used: Rolling walker (2 wheeled) Transfers: Sit to/from Stand Sit to Stand: Mod assist;From elevated surface         General transfer comment: bed in elevated position (pt stating her bed at home is high), cueing for safe hand placement, demonstration for technique; assistance needed to power into standing and achieve full upright position  Ambulation/Gait Ambulation/Gait assistance: Min assist Gait Distance (Feet): 18 Feet Assistive device: Rolling walker (2 wheeled) Gait Pattern/deviations: Step-to pattern;Decreased step length - right;Decreased stance time - left;Decreased stride length;Decreased weight shift to left;Trunk flexed     General Gait Details: Cues for gait sequence and to use RW to unweigh painful LLE in stance   Stairs             Wheelchair Mobility    Modified Rankin (Stroke Patients Only)       Balance     Sitting balance-Leahy Scale: Good       Standing balance-Leahy Scale: Poor                              Cognition Arousal/Alertness: Awake/alert Behavior During Therapy: Anxious(fearful of falling) Overall Cognitive Status: Within Functional Limits for tasks assessed                                        Exercises      General Comments General comments (skin integrity, edema, etc.): Lengthy discussion with pt and her daughter Grace Alvarado  re: dc planning; they are interested in post-acute rehab; compared CIR to SNF with them, and discussed other options      Pertinent Vitals/Pain Pain Assessment: Faces Faces Pain Scale: Hurts little more Pain Location: L knee Pain Descriptors / Indicators: Sore;Guarding Pain Intervention(s): Monitored during session;Premedicated before session    Home Living                      Prior Function            PT Goals (current goals can now be found in the care plan section) Acute Rehab PT  Goals Patient Stated Goal: decrease pain; return to independence  PT Goal Formulation: With patient/family Time For Goal Achievement: 03/17/20 Potential to Achieve Goals: Good Progress towards PT goals: Progressing toward goals    Frequency    Min 3X/week      PT Plan Current plan remains appropriate    Co-evaluation              AM-PAC PT "6 Clicks" Mobility   Outcome Measure  Help needed turning from your back to your side while in a flat bed without using bedrails?: A Little Help needed moving from lying on your back to sitting on the side of a flat bed without using bedrails?: A Little Help needed moving to and from a bed to a chair (including a wheelchair)?: A Little Help needed standing up from a chair using your arms (e.g., wheelchair or bedside chair)?: A Lot Help needed to walk in hospital room?: A Little Help needed climbing 3-5 steps with a railing? : A Lot 6 Click Score: 16    End of Session Equipment Utilized During Treatment: Gait belt;Left knee immobilizer Activity Tolerance: Patient tolerated treatment well Patient left: in chair;with call bell/phone within reach;with family/visitor present Nurse Communication: Mobility status;Other (comment)(technique for getting back to bed) PT Visit Diagnosis: Other abnormalities of gait and mobility (R26.89);Pain Pain - Right/Left: Left Pain - part of body: Knee     Time: 7867-6720 PT Time Calculation (min) (ACUTE ONLY): 42 min  Charges:  $Gait Training: 8-22 mins $Therapeutic Activity: 23-37 mins                     Grace Alvarado, PT  Acute Rehabilitation Services Pager [phone] Office [phone]    Grace Alvarado 03/04/2020, 5:56 PM

## 2020-03-05 LAB — CBC WITH DIFFERENTIAL/PLATELET
Abs Immature Granulocytes: 0.03 10*3/uL (ref 0.00–0.07)
Basophils Absolute: 0 10*3/uL (ref 0.0–0.1)
Basophils Relative: 0 %
Eosinophils Absolute: 0 10*3/uL (ref 0.0–0.5)
Eosinophils Relative: 0 %
HCT: 23.6 % — ABNORMAL LOW (ref 36.0–46.0)
Hemoglobin: 7.8 g/dL — ABNORMAL LOW (ref 12.0–15.0)
Immature Granulocytes: 1 %
Lymphocytes Relative: 13 %
Lymphs Abs: 0.8 10*3/uL (ref 0.7–4.0)
MCH: 33.2 pg (ref 26.0–34.0)
MCHC: 33.1 g/dL (ref 30.0–36.0)
MCV: 100.4 fL — ABNORMAL HIGH (ref 80.0–100.0)
Monocytes Absolute: 0.5 10*3/uL (ref 0.1–1.0)
Monocytes Relative: 9 %
Neutro Abs: 4.9 10*3/uL (ref 1.7–7.7)
Neutrophils Relative %: 77 %
Platelets: 183 10*3/uL (ref 150–400)
RBC: 2.35 MIL/uL — ABNORMAL LOW (ref 3.87–5.11)
RDW: 16.5 % — ABNORMAL HIGH (ref 11.5–15.5)
WBC: 6.3 10*3/uL (ref 4.0–10.5)
nRBC: 0 % (ref 0.0–0.2)

## 2020-03-05 LAB — BASIC METABOLIC PANEL
Anion gap: 8 (ref 5–15)
BUN: 14 mg/dL (ref 8–23)
CO2: 24 mmol/L (ref 22–32)
Calcium: 8.8 mg/dL — ABNORMAL LOW (ref 8.9–10.3)
Chloride: 95 mmol/L — ABNORMAL LOW (ref 98–111)
Creatinine, Ser: 0.58 mg/dL (ref 0.44–1.00)
GFR calc Af Amer: >60 mL/min (ref >60)
GFR calc non Af Amer: >60 mL/min (ref >60)
Glucose, Bld: 112 mg/dL — ABNORMAL HIGH (ref 70–99)
Potassium: 4.6 mmol/L (ref 3.5–5.1)
Sodium: 127 mmol/L — ABNORMAL LOW (ref 135–145)

## 2020-03-05 LAB — SODIUM
Sodium: 129 mmol/L — ABNORMAL LOW (ref 135–145)
Sodium: 131 mmol/L — ABNORMAL LOW (ref 135–145)
Sodium: 128 mmol/L — ABNORMAL LOW (ref 135–145)

## 2020-03-05 NOTE — Evaluation (Signed)
Occupational Therapy Evaluation Patient Details Name: Grace Alvarado MRN: [id number] DOB: 1925-01-14 Today's Date: 03/05/2020    History of Present Illness Pt is a 86 y/o female admitted secondary to a L knee hematoma after sustaining a fall at home several day prior. Pt is now s/p hematoma evacuation with wound VAC placement. PMH including but not limited to a-fib, HTN and HLD.   Clinical Impression   PTA, pt was living alone and was independent with ADLs and IADLs; daughters work and unable to stay full time. Pt currently requiring Mod-Max A for LB ADLs and Min Guard-Min A for functional mobility with RW. Pt presenting with decreased activity tolerance with fatigue and weakness. Pt would benefit from further acute OT to facilitate safe dc. Recommend dc to SNF for further OT to optimize safety, independence with ADLs, and return to PLOF.    Follow Up Recommendations  SNF    Equipment Recommendations  Other (comment)(RW; defer to next venue)    Recommendations for Other Services PT consult     Precautions / Restrictions Precautions Precautions: Fall Precaution Comments: wound VAC Required Braces or Orthoses: Knee Immobilizer - Left Knee Immobilizer - Left: On at all times;On when out of bed or walking;Other (comment)(pt is not allowed any knee flexion at all) Restrictions Weight Bearing Restrictions: Yes LLE Weight Bearing: Weight bearing as tolerated Other Position/Activity Restrictions: with KI donned      Mobility Bed Mobility Overal bed mobility: Needs Assistance Bed Mobility: Supine to Sit     Supine to sit: Min assist     General bed mobility comments: Min A for assistance to manage LLE over EOB  Transfers Overall transfer level: Needs assistance Equipment used: Rolling walker (2 wheeled) Transfers: Sit to/from Stand Sit to Stand: Min assist         General transfer comment: Min A for power up into standing    Balance Overall balance assessment: Needs  assistance Sitting-balance support: Feet supported Sitting balance-Leahy Scale: Good     Standing balance support: No upper extremity supported;During functional activity Standing balance-Leahy Scale: Fair Standing balance comment: Able to maintain standing at sink with Min guard A for safety in standing                           ADL either performed or assessed with clinical judgement   ADL Overall ADL's : Needs assistance/impaired Eating/Feeding: Set up;Sitting   Grooming: Min guard;Standing;Oral care;Wash/dry face   Upper Body Bathing: Set up;Supervision/ safety;Sitting   Lower Body Bathing: Minimal assistance;Sit to/from stand   Upper Body Dressing : Set up;Supervision/safety;Sitting   Lower Body Dressing: Maximal assistance;Sit to/from stand Lower Body Dressing Details (indicate cue type and reason): Max A for donning over LLE Toilet Transfer: Minimal assistance;Ambulation;RW           Functional mobility during ADLs: Minimal assistance;Min guard;Rolling walker General ADL Comments: Pt presenting with decreased activity tolerance due to pain and fatigue. Pt reporting "I feel weak"     Vision         Perception     Praxis      Pertinent Vitals/Pain Pain Assessment: Faces Faces Pain Scale: Hurts even more Pain Location: L knee Pain Descriptors / Indicators: Sore;Guarding Pain Intervention(s): Monitored during session;Limited activity within patient's tolerance;Repositioned;Patient requesting pain meds-RN notified     Hand Dominance Right   Extremity/Trunk Assessment Upper Extremity Assessment Upper Extremity Assessment: Overall WFL for tasks assessed   Lower Extremity Assessment Lower  Extremity Assessment: Defer to PT evaluation LLE Deficits / Details: wound VAC in place; KI donned throughout; knee maintained in full extension; pt required assistance for movement of LE for functional mobility but able to accept weight once in standing LLE:  Unable to fully assess due to immobilization       Communication Communication Communication: No difficulties   Cognition Arousal/Alertness: Awake/alert Behavior During Therapy: Anxious(fearful of falling) Overall Cognitive Status: Impaired/Different from baseline Area of Impairment: Memory                     Memory: Decreased short-term memory         General Comments: Feel pt is close to baseline cognition. However, requiring reassuring cues and reminders throughout session for "where we are going next" throughout session.   General Comments  Daughter presenti throughout session    Exercises     Shoulder Instructions      Home Living Family/patient expects to be discharged to:: Private residence Living Arrangements: Alone Available Help at Discharge: Family;Available 24 hours/day Type of Home: Apartment Home Access: Level entry     Home Layout: One level     Bathroom Shower/Tub: Producer, television/film/video: Handicapped height     Home Equipment: Shower seat;Cane - single point;Walker - standard;Toilet riser          Prior Functioning/Environment Level of Independence: Independent        Comments: ADLs and IADLs; excluding driving        OT Problem List: Decreased strength;Decreased activity tolerance;Decreased range of motion;Decreased safety awareness;Decreased knowledge of use of DME or AE;Decreased knowledge of precautions;Pain      OT Treatment/Interventions: Self-care/ADL training;Therapeutic exercise;Energy conservation;DME and/or AE instruction;Therapeutic activities;Patient/family education    OT Goals(Current goals can be found in the care plan section) Acute Rehab OT Goals Patient Stated Goal: Return home and back to independence OT Goal Formulation: With patient/family Time For Goal Achievement: 03/19/20 Potential to Achieve Goals: Good  OT Frequency: Min 2X/week   Barriers to D/C:            Co-evaluation               AM-PAC OT "6 Clicks" Daily Activity     Outcome Measure Help from another person eating meals?: A Little Help from another person taking care of personal grooming?: A Little Help from another person toileting, which includes using toliet, bedpan, or urinal?: A Little Help from another person bathing (including washing, rinsing, drying)?: A Lot Help from another person to put on and taking off regular upper body clothing?: A Little Help from another person to put on and taking off regular lower body clothing?: A Lot 6 Click Score: 16   End of Session Equipment Utilized During Treatment: Gait belt;Rolling walker Nurse Communication: Mobility status  Activity Tolerance: Patient tolerated treatment well Patient left: in chair;with call bell/phone within reach;with family/visitor present;with nursing/sitter in room  OT Visit Diagnosis: Unsteadiness on feet (R26.81);Other abnormalities of gait and mobility (R26.89);Muscle weakness (generalized) (M62.81);Pain Pain - Right/Left: Left Pain - part of body: Knee                Time: 2993-7169 OT Time Calculation (min): 22 min Charges:  OT General Charges $OT Visit: 1 Visit OT Evaluation $OT Eval Low Complexity: 1 Low  Vietta Bonifield MSOT, OTR/L Acute Rehab Pager: [phone] Office: [phone]  Theodoro Grist Morissa Obeirne 03/05/2020, 10:26 AM

## 2020-03-05 NOTE — Progress Notes (Signed)
     Subjective: 3 Days Post-Op Procedure(s) (LRB): evacuation of left knee hematoma, possible wound vac placement (Left) Awake, alert and oriented x 4. Left knee immobilizer intact. VAC left anterior knee intact.  IV antibiotics Has a foley catheter to SD.  Patient reports pain as mild.    Objective:   VITALS:  Temp:  [97.5 F (36.4 C)-98.9 F (37.2 C)] 98.9 F (37.2 C) (05/31 1310) Pulse Rate:  [74-99] 96 (05/31 1310) Resp:  [16-18] 18 (05/31 1310) BP: (99-116)/(54-65) 102/54 (05/31 1310) SpO2:  [92 %-96 %] 96 % (05/31 1310)  Neurologically intact ABD soft Neurovascular intact Sensation intact distally Intact pulses distally Dorsiflexion/Plantar flexion intact Incision: dressing C/D/I and no drainage No cellulitis present Compartment soft VAC intact and drawing negative pressure.    LABS Recent Labs    03/03/20 0810 03/03/20 0810 03/04/20 0646 03/05/20 0213  HGB 8.2*  --  8.0* 7.8*  WBC 3.7*   < > 8.7 6.3  PLT 145*   < > 174 183   < > = values in this interval not displayed.   Recent Labs    03/04/20 0646 03/04/20 1408 03/05/20 0213 03/05/20 0909  NA 125*   < > 127* 129*  K 4.9  --  4.6  --   CL 95*  --  95*  --   CO2 25  --  24  --   BUN 14  --  14  --   CREATININE 0.63  --  0.58  --   GLUCOSE 147*  --  112*  --    < > = values in this interval not displayed.   No results for input(s): LABPT, INR in the last 72 hours.   Assessment/Plan: 3 Days Post-Op Procedure(s) (LRB): evacuation of left knee hematoma, possible wound vac placement (Left)  Advance diet Up with therapy D/C IV fluids Discharge to SNF  Grace Alvarado 03/05/2020, 2:09 PMPatient ID: Grace Alvarado, female   DOB: [birth year], 86 y.o.   MRN: [id number]

## 2020-03-05 NOTE — Progress Notes (Signed)
PROGRESS NOTE    Grace Alvarado  FHQ:[id number] DOB: 1924-12-04 DOA: 03/02/2020 PCP: Pincus Sanes, MD   Brief Narrative:  HPI: Grace Alvarado is a 86 y.o. female with medical history significant of hypothyroidism; HTN; HLD; and afib on Xarelto presented with left knee pain/ infection.   She fell off the chair 2 weeks prior to presentation. She called 911 and seemed to be ok.  The left knee started swelling and so they came to the ER on May 19 and she was discharged her.  She followed up with Dr. Denyse Amass later on 5/21.  She was given an antibiotic for traumatic prepatellar bursitis.  It was getting worse and so they were seen again on 03/01/2020; she was given Clindamycin and discharged.  She followed up again and it got worse again and so she was sent to ED on 03/02/2020.  No fever.  Upon arrival to ED, case was discussed between ED physician and orthopedics and Dr. August Saucer of orthopedics recommended admission as he plans to do incision and drainage in the OR.  Patient was then admitted under hospital service.  Assessment & Plan:   Principal Problem:   Prepatellar bursitis, left knee Active Problems:   Chronic a-fib   Essential hypertension   Hypothyroid   Dyslipidemia  Prepatellar bursitis: Status post evacuation of left hematoma and wound VAC placement.  Pain fairly controlled.  Management deferred to orthopedics.  Paroxysmal atrial fibrillation: In sinus rhythm.  Continue Cardizem and  Xarelto.  Acute hyponatremia: Sodium dropped to 125 lowest on 03/04/2020.  Based on the studies, this does not seem to be SIADH.  Started on normal saline.  Sodium improved to 127 today.  Check every 8 hours.  Continue IV fluids.  Acute urinary retention: Patient could not pass urine and needed straight cath on the night of 03/02/2020 and then again she was retaining in the afternoon of 03/03/2020 so Foley catheter was inserted.  We will discontinue Foley catheter today in preparation for discharge  tomorrow.  Essential HTN:  -Controlled.  Continue Cardizem  HLD -Continue Pravachol  Hypothyroidism -Continue Synthroid at current dose for now  Constipation: Continue Colace and scheduled MiraLAX.  Patient is not ready for suppository.  DVT prophylaxis: xarelto   Code Status: DNR  Family Communication: Daughter present at bedside.  Plan of care discussed with patient in length and she verbalized understanding and agreed with it. Patient is from: Home Disposition Plan: To SNF. Barriers to discharge: Placement.  Status is: Inpatient  Remains inpatient appropriate because:Ongoing active pain requiring inpatient pain management   Dispo: The patient is from: Home              Anticipated d/c is to: SNF              Anticipated d/c date is: 1 day              Patient currently is medically stable to d/c.         Estimated body mass index is 23.56 kg/m as calculated from the following:   Height as of this encounter: 5\' 3"  (1.6 m).   Weight as of this encounter: 60.3 kg.      Nutritional status:               Consultants:   Orthopedics  Procedures:   Left knee evacuation  Antimicrobials:  Anti-infectives (From admission, onward)   Start     Dose/Rate Route Frequency Ordered Stop   03/03/20 2230  clindamycin (CLEOCIN) IVPB 600 mg     600 mg 100 mL/hr over 30 Minutes Intravenous Every 8 hours 03/02/20 2135 03/04/20 0737   03/02/20 1937  tobramycin (NEBCIN) powder  Status:  Discontinued       As needed 03/02/20 1937 03/02/20 2019   03/02/20 1130  clindamycin (CLEOCIN) IVPB 900 mg     900 mg 100 mL/hr over 30 Minutes Intravenous On call to O.R. 03/02/20 1123 03/02/20 1940   03/02/20 1130  clindamycin (CLEOCIN) IVPB 300 mg     300 mg 100 mL/hr over 30 Minutes Intravenous  Once 03/02/20 1109 03/02/20 1407         Subjective: Seen and examined.  Daughter at the bedside.  Pain very well controlled.  No new complaint.  Still has not had any  bowel movement in last several days.  Passing flatus.  Does not want suppository.  Objective: Vitals:   03/04/20 2114 03/05/20 0534 03/05/20 1029 03/05/20 1310  BP: 110/63 109/65 116/63 (!) 102/54  Pulse: 74 88 98 96  Resp: 18 18  18   Temp: 98.2 F (36.8 C) 98.2 F (36.8 C)  98.9 F (37.2 C)  TempSrc: Oral Oral  Oral  SpO2: 96% 95%  96%  Weight:      Height:        Intake/Output Summary (Last 24 hours) at 03/05/2020 1347 Last data filed at 03/05/2020 0600 Gross per 24 hour  Intake 1709.79 ml  Output 1700 ml  Net 9.79 ml   Filed Weights   03/02/20 0843 03/02/20 1734  Weight: 60.3 kg 60.3 kg    Examination:  General exam: Appears calm and comfortable  Respiratory system: Clear to auscultation. Respiratory effort normal. Cardiovascular system: S1 & S2 heard, RRR. No JVD, murmurs, rubs, gallops or clicks. No pedal edema. Gastrointestinal system: Abdomen is nondistended, soft and nontender. No organomegaly or masses felt. Normal bowel sounds heard. Central nervous system: Alert and oriented. No focal neurological deficits. Extremities: Left knee covered in Ace wrap.  Direct visualization was deferred to orthopedics. Skin: No rashes, lesions or ulcers.  Psychiatry: Judgement and insight appear normal. Mood & affect appropriate.   Data Reviewed: I have personally reviewed following labs and imaging studies  CBC: Recent Labs  Lab 03/02/20 0853 03/03/20 0810 03/04/20 0646 03/05/20 0213  WBC 5.9 3.7* 8.7 6.3  NEUTROABS 4.3  --  7.4 4.9  HGB 8.9* 8.2* 8.0* 7.8*  HCT 27.1* 25.1* 24.5* 23.6*  MCV 101.9* 101.6* 101.2* 100.4*  PLT 139* 145* 174 183   Basic Metabolic Panel: Recent Labs  Lab 03/02/20 0853 03/02/20 0853 03/03/20 0810 03/03/20 1236 03/04/20 0646 03/04/20 1408 03/04/20 1951 03/05/20 0213 03/05/20 0909  NA 131*   < > 129*   < > 125* 126* 126* 127* 129*  K 3.9  --  4.7  --  4.9  --   --  4.6  --   CL 98  --  95*  --  95*  --   --  95*  --   CO2 25  --   25  --  25  --   --  24  --   GLUCOSE 119*  --  198*  --  147*  --   --  112*  --   BUN 13  --  14  --  14  --   --  14  --   CREATININE 0.63  --  0.63  --  0.63  --   --  0.58  --   CALCIUM 9.2  --  9.4  --  8.8*  --   --  8.8*  --    < > = values in this interval not displayed.   GFR: Estimated Creatinine Clearance: 37.1 mL/min (by C-G formula based on SCr of 0.58 mg/dL). Liver Function Tests: Recent Labs  Lab 03/02/20 0853  AST 22  ALT 21  ALKPHOS 54  BILITOT 1.5*  PROT 6.5  ALBUMIN 3.3*   No results for input(s): LIPASE, AMYLASE in the last 168 hours. No results for input(s): AMMONIA in the last 168 hours. Coagulation Profile: No results for input(s): INR, PROTIME in the last 168 hours. Cardiac Enzymes: No results for input(s): CKTOTAL, CKMB, CKMBINDEX, TROPONINI in the last 168 hours. BNP (last 3 results) No results for input(s): PROBNP in the last 8760 hours. HbA1C: No results for input(s): HGBA1C in the last 72 hours. CBG: No results for input(s): GLUCAP in the last 168 hours. Lipid Profile: No results for input(s): CHOL, HDL, LDLCALC, TRIG, CHOLHDL, LDLDIRECT in the last 72 hours. Thyroid Function Tests: No results for input(s): TSH, T4TOTAL, FREET4, T3FREE, THYROIDAB in the last 72 hours. Anemia Panel: No results for input(s): VITAMINB12, FOLATE, FERRITIN, TIBC, IRON, RETICCTPCT in the last 72 hours. Sepsis Labs: No results for input(s): PROCALCITON, LATICACIDVEN in the last 168 hours.  Recent Results (from the past 240 hour(s))  Wound culture     Status: None   Collection Time: 03/01/20  4:50 PM   Specimen: Wound  Result Value Ref Range Status   MICRO NUMBER: [id number]  Final   SPECIMEN QUALITY: Adequate  Final   SOURCE: WOUND (SITE NOT SPECIFIED)  Final   STATUS: FINAL  Final   GRAM STAIN:   Final    No white blood cells seen No epithelial cells seen No organisms seen   RESULT: No Growth  Final  SARS Coronavirus 2 by RT PCR (hospital order, performed in  [place] hospital lab) Nasopharyngeal Nasopharyngeal Swab     Status: None   Collection Time: 03/02/20 10:55 AM   Specimen: Nasopharyngeal Swab  Result Value Ref Range Status   SARS Coronavirus 2 NEGATIVE NEGATIVE Final    Comment: (NOTE) SARS-CoV-2 target nucleic acids are NOT DETECTED. The SARS-CoV-2 RNA is generally detectable in upper and lower respiratory specimens during the acute phase of infection. The lowest concentration of SARS-CoV-2 viral copies this assay can detect is 250 copies / mL. A negative result does not preclude SARS-CoV-2 infection and should not be used as the sole basis for treatment or other patient management decisions.  A negative result may occur with improper specimen collection / handling, submission of specimen other than nasopharyngeal swab, presence of viral mutation(s) within the areas targeted by this assay, and inadequate number of viral copies (<250 copies / mL). A negative result must be combined with clinical observations, patient history, and epidemiological information. Fact Sheet for Patients:   StrictlyIdeas.no Fact Sheet for Healthcare Providers: BankingDealers.co.za This test is not yet approved or cleared  by the [place] FDA and has been authorized for detection and/or diagnosis of SARS-CoV-2 by FDA under an Emergency Use Authorization (EUA).  This EUA will remain in effect (meaning this test can be used) for the duration of the COVID-19 declaration under Section 564(b)(1) of the Act, 21 U.S.C. section 360bbb-3(b)(1), unless the authorization is terminated or revoked sooner. Performed at [facility], [place] [address] [place], [place] [address]   Aerobic/Anaerobic Culture (surgical/deep  wound)     Status: None (Preliminary result)   Collection Time: 03/02/20  7:35 PM   Specimen: Soft Tissue, Other  Result Value Ref Range Status   Specimen Description KNEE  Final   Special  Requests PRE PATELLA HEMATOMA LEFT  Final   Gram Stain   Final    RARE WBC PRESENT, PREDOMINANTLY PMN RARE GRAM POSITIVE COCCI    Culture   Final    NO GROWTH 2 DAYS Performed at [facility], [address] [place], [place] [address]    Report Status PENDING  Incomplete      Radiology Studies: No results found.  Scheduled Meds: . calcium carbonate  1 tablet Oral BID  . chlorhexidine  60 mL Topical Once  . Chlorhexidine Gluconate Cloth  6 each Topical Daily  . diltiazem  120 mg Oral BID  . docusate sodium  100 mg Oral BID  . levothyroxine  75 mcg Oral QAC breakfast  . polyethylene glycol  17 g Oral Daily  . povidone-iodine  2 application Topical Once  . pravastatin  10 mg Oral Daily  . rivaroxaban  20 mg Oral Q supper   Continuous Infusions: . sodium chloride 75 mL/hr at 03/05/20 0018  . methocarbamol (ROBAXIN) IV       LOS: 3 days   Time spent: 30 minutes    Hughie Closs, MD [facility]  03/05/2020, 1:47 PM   To contact the attending provider between 7A-7P or the covering provider during after hours 7P-7A, please log into the web site [email or web address].

## 2020-03-05 NOTE — Progress Notes (Signed)
Physical Therapy Treatment Patient Details Name: Grace Alvarado MRN: [id number] DOB: August 19, 1925 Today's Date: 03/05/2020    History of Present Illness Pt is a 86 y/o female admitted secondary to a L knee hematoma after sustaining a fall at home several day prior. Pt is now s/p hematoma evacuation with wound VAC placement. PMH including but not limited to a-fib, HTN and HLD.    PT Comments    Continuing work on functional mobility and activity tolerance;  Notable progess with standing tolerance and gait distance; good use of RW to unweigh painful LLE, occasional need for cues for RW proximity; She seemed pleased with her progress   Follow Up Recommendations  SNF;Supervision/Assistance - 24 hour;Other (comment);CIR     Equipment Recommendations  Rolling walker with 5" wheels;3in1 (PT);Wheelchair (measurements PT);Wheelchair cushion (measurements PT);Other (comment)(w/c with elevating leg rests)    Recommendations for Other Services       Precautions / Restrictions Precautions Precautions: Fall Precaution Comments: wound VAC Required Braces or Orthoses: Knee Immobilizer - Left Knee Immobilizer - Left: On at all times;On when out of bed or walking;Other (comment)(pt is not allowed any knee flexion at all) Restrictions LLE Weight Bearing: Weight bearing as tolerated Other Position/Activity Restrictions: with KI donned    Mobility  Bed Mobility Overal bed mobility: Needs Assistance Bed Mobility: Supine to Sit     Supine to sit: Min assist     General bed mobility comments: Min A for assistance to manage LLE over EOB  Transfers Overall transfer level: Needs assistance Equipment used: Rolling walker (2 wheeled) Transfers: Sit to/from Stand Sit to Stand: Min assist         General transfer comment: Min A for power up into standing  Ambulation/Gait Ambulation/Gait assistance: Min Chemical engineer (Feet): 35 Feet Assistive device: Rolling walker (2 wheeled) Gait  Pattern/deviations: Step-to pattern;Decreased step length - right;Decreased stance time - left;Decreased stride length;Decreased weight shift to left;Trunk flexed Gait velocity: decreased   General Gait Details: Cues for gait sequence and to use RW to unweigh painful LLE in stance; Chair pushed behind to boost pt confidence   Stairs             Wheelchair Mobility    Modified Rankin (Stroke Patients Only)       Balance     Sitting balance-Leahy Scale: Good       Standing balance-Leahy Scale: Fair                              Cognition Arousal/Alertness: Awake/alert Behavior During Therapy: WFL for tasks assessed/performed(less anxious) Overall Cognitive Status: Impaired/Different from baseline Area of Impairment: Memory                     Memory: Decreased short-term memory         General Comments: Feel pt is close to baseline cognition. However, requiring reassuring cues and reminders throughout session for "where we are going next" throughout session.      Exercises      General Comments General comments (skin integrity, edema, etc.): Daughter presenti throughout session      Pertinent Vitals/Pain Pain Assessment: Faces Faces Pain Scale: Hurts even more Pain Location: L knee Pain Descriptors / Indicators: Sore;Guarding Pain Intervention(s): Monitored during session    Home Living                      Prior Function  PT Goals (current goals can now be found in the care plan section) Acute Rehab PT Goals Patient Stated Goal: Return home and back to independence PT Goal Formulation: With patient/family Time For Goal Achievement: 03/17/20 Potential to Achieve Goals: Good Progress towards PT goals: Progressing toward goals    Frequency    Min 3X/week      PT Plan Current plan remains appropriate    Co-evaluation              AM-PAC PT "6 Clicks" Mobility   Outcome Measure  Help needed  turning from your back to your side while in a flat bed without using bedrails?: A Little Help needed moving from lying on your back to sitting on the side of a flat bed without using bedrails?: A Little Help needed moving to and from a bed to a chair (including a wheelchair)?: A Little Help needed standing up from a chair using your arms (e.g., wheelchair or bedside chair)?: A Little Help needed to walk in hospital room?: A Little Help needed climbing 3-5 steps with a railing? : A Lot 6 Click Score: 17    End of Session Equipment Utilized During Treatment: Gait belt;Left knee immobilizer Activity Tolerance: Patient tolerated treatment well Patient left: in chair;with call bell/phone within reach;with family/visitor present Nurse Communication: Mobility status PT Visit Diagnosis: Other abnormalities of gait and mobility (R26.89);Pain Pain - Right/Left: Left Pain - part of body: Knee     Time: 1600-1630 PT Time Calculation (min) (ACUTE ONLY): 30 min  Charges:  $Gait Training: 8-22 mins $Therapeutic Activity: 8-22 mins                     Roney Marion, PT  Acute Rehabilitation Services Pager [phone] Office [facility] 03/05/2020, 7:29 PM

## 2020-03-05 NOTE — TOC Initial Note (Signed)
Transition of Care [facility]) - Initial/Assessment Note    Patient Details  Name: Grace Alvarado MRN: [id number] Date of Birth: 08-01-1925  [facility] CM/SW Contact:    Emeterio Reeve, [place] Phone Number: 03/05/2020, 12:55 PM  Clinical Narrative:                 CSW met with pt at bedside. CSW introduced self and explained her role at the hospital. Pts daghter was present. Pt is a 86 y/o female admitted secondary to a L knee hematoma after sustaining a fall. Pt stated PTA she lived alone in her apartment and her daughters live nearby and check on her often. Pt stated she was independent and managing all ADL's Pt stated she hopes to remain as independent as possible.   CSW reviewed pt/ot reccs of SNF. Pt and family are in agreement. Pt stated shes never been to a facility and is open to being faxed out to facilities in [place]. PTstates she has had the covid vaccine in February. CSW explained that after pt is faxed out and acceptances are received she will provide pt with ratings list from medicare.gov for choice.   CSW will continue to follow.    Expected Discharge Plan: Skilled Nursing Facility Barriers to Discharge: Continued Medical Work up   Patient Goals and CMS Choice Patient states their goals for this hospitalization and ongoing recovery are:: To be independent CMS Medicare.gov Compare Post Acute Care list provided to:: Patient Choice offered to / list presented to : Patient  Expected Discharge Plan and Services Expected Discharge Plan: [place] arrangements for the past 2 months: Apartment                                      Prior Living Arrangements/Services Living arrangements for the past 2 months: Apartment Lives with:: Self Patient language and need for interpreter reviewed:: Yes Do you feel safe going back to the place where you live?: Yes      Need for Family Participation in Patient Care: Yes (Comment) Care  giver support system in place?: Yes (comment)   Criminal Activity/Legal Involvement Pertinent to Current Situation/Hospitalization: No - Comment as needed  Activities of Daily Living      Permission Sought/Granted   Permission granted to share information with : Yes, Verbal Permission Granted     Permission granted to share info w AGENCY: SNF        Emotional Assessment Appearance:: Appears stated age Attitude/Demeanor/Rapport: Engaged Affect (typically observed): Appropriate Orientation: : Oriented to Self, Oriented to  Time, Oriented to Place, Oriented to Situation Alcohol / Substance Use: Not Applicable Psych Involvement: No (comment)  Admission diagnosis:  Hematoma [T14.8XXA] Prepatellar bursitis, left knee [M70.42] Patient Active Problem List   Diagnosis Date Noted  . Prepatellar bursitis, left knee 03/02/2020  . Neuropathy 01/24/2020  . Chronic back pain 01/24/2020  . Complaints of leg weakness 01/24/2020  . Compression fracture of T11 vertebra ([place]) 01/23/2020  . Leg pain 01/03/2020  . Chronic anticoagulation 01/03/2020  . Compression fracture of L1 lumbar vertebra ([place]) 07/27/2019  . Low back pain 07/20/2019  . Hyperglycemia 05/01/2019  . Neck pain, musculoskeletal 11/16/2018  . Constipation 11/16/2018  . Hypokalemia   . AVM (arteriovenous malformation) of colon   . Lower GI bleed 11/06/2018  . Iron deficiency anemia 11/02/2018  . Lymphedema of  left lower extremity 10/22/2018  . B12 deficiency 05/27/2018  . Dependent edema 05/25/2018  . Pulmonary hypertension, primary ([place]) 05/20/2018  . Dyslipidemia 05/20/2018  . CHF (congestive heart failure) ([place]) 05/20/2018  . Tricuspid regurgitation 05/17/2018  . Mitral regurgitation 05/17/2018  . Chronic a-fib 05/17/2018  . Essential hypertension 05/17/2018  . Hypothyroid 05/17/2018  . CAD (coronary artery disease) 05/17/2018  . Carotid atherosclerosis, bilateral 05/17/2018   PCP:  Binnie Rail, MD Pharmacy:    [facility], [place] [place] [address] [place] [address] Phone: [phone] Fax: [phone]     Social Determinants of Health (SDOH) Interventions    Readmission Risk Interventions No flowsheet data found.  Emeterio Reeve, Latanya Presser, [place] Social Worker [phone]

## 2020-03-05 NOTE — [place] FL2 (Signed)
[place] MEDICAID FL2 LEVEL OF CARE SCREENING TOOL     IDENTIFICATION  Patient Name: Grace Alvarado Birthdate: June 18, 1925 Sex: female Admission Date (Current Location): 03/02/2020  [facility] and [place] Number:  Producer, television/film/video and Address:  [facility] [place], [address] [place], [place] [address]      Provider Number: [id number]  Attending Physician Name and Address:  Hughie Closs, MD  Relative Name and Phone Number:       Current Level of Care: Hospital Recommended Level of Care: Skilled Nursing Facility Prior Approval Number:    Date Approved/Denied:   PASRR Number: [id number]  Discharge Plan: SNF    Current Diagnoses: Patient Active Problem List   Diagnosis Date Noted  . Prepatellar bursitis, left knee 03/02/2020  . Neuropathy 01/24/2020  . Chronic back pain 01/24/2020  . Complaints of leg weakness 01/24/2020  . Compression fracture of T11 vertebra (HCC) 01/23/2020  . Leg pain 01/03/2020  . Chronic anticoagulation 01/03/2020  . Compression fracture of L1 lumbar vertebra (HCC) 07/27/2019  . Low back pain 07/20/2019  . Hyperglycemia 05/01/2019  . Neck pain, musculoskeletal 11/16/2018  . Constipation 11/16/2018  . Hypokalemia   . AVM (arteriovenous malformation) of colon   . Lower GI bleed 11/06/2018  . Iron deficiency anemia 11/02/2018  . Lymphedema of left lower extremity 10/22/2018  . B12 deficiency 05/27/2018  . Dependent edema 05/25/2018  . Pulmonary hypertension, primary (HCC) 05/20/2018  . Dyslipidemia 05/20/2018  . CHF (congestive heart failure) (HCC) 05/20/2018  . Tricuspid regurgitation 05/17/2018  . Mitral regurgitation 05/17/2018  . Chronic a-fib 05/17/2018  . Essential hypertension 05/17/2018  . Hypothyroid 05/17/2018  . CAD (coronary artery disease) 05/17/2018  . Carotid atherosclerosis, bilateral 05/17/2018    Orientation RESPIRATION BLADDER Height & Weight     Self, Time, Situation, Place  Normal Continent  Weight: 133 lb (60.3 kg) Height:  5\' 3"  (160 cm)  BEHAVIORAL SYMPTOMS/MOOD NEUROLOGICAL BOWEL NUTRITION STATUS      Continent Diet(see discharge summary)  AMBULATORY STATUS COMMUNICATION OF NEEDS Skin   Limited Assist Verbally Normal                       Personal Care Assistance Level of Assistance  Bathing, Feeding, Dressing Bathing Assistance: Limited assistance Feeding assistance: Independent Dressing Assistance: Limited assistance     Functional Limitations Info  Sight, Hearing, Speech Sight Info: Adequate Hearing Info: Adequate Speech Info: Adequate    SPECIAL CARE FACTORS FREQUENCY  PT (By licensed PT), OT (By licensed OT)     PT Frequency: 5x a week OT Frequency: 5x a week            Contractures Contractures Info: Not present    Additional Factors Info  Code Status, Allergies Code Status Info: DNR Allergies Info: Codeine Penicillins Vancomycin Zithromax Latex           Current Medications (03/05/2020):  This is the current hospital active medication list Current Facility-Administered Medications  Medication Dose Route Frequency Provider Last Rate Last Admin  . 0.9 %  sodium chloride infusion   Intravenous Continuous 03/07/2020, MD 75 mL/hr at 03/05/20 0018 New Bag at 03/05/20 0018  . acetaminophen (TYLENOL) tablet 325-650 mg  325-650 mg Oral Q6H PRN Magnant, Charles L, PA-C      . bisacodyl (DULCOLAX) EC tablet 5 mg  5 mg Oral Daily PRN 03/07/20, MD      . calcium carbonate (TUMS - dosed in mg  elemental calcium) chewable tablet 200 mg of elemental calcium  1 tablet Oral BID Darliss Cheney, MD   200 mg of elemental calcium at 03/05/20 0804  . chlorhexidine (HIBICLENS) 4 % liquid 4 application  60 mL Topical Once Karmen Bongo, MD      . Chlorhexidine Gluconate Cloth 2 % PADS 6 each  6 each Topical Daily Darliss Cheney, MD   6 each at 03/05/20 0846  . diltiazem (CARDIZEM SR) 12 hr capsule 120 mg  120 mg Oral BID Karmen Bongo, MD   120 mg at  03/05/20 1030  . docusate sodium (COLACE) capsule 100 mg  100 mg Oral BID Magnant, Charles L, PA-C   100 mg at 03/05/20 0845  . HYDROcodone-acetaminophen (NORCO/VICODIN) 5-325 MG per tablet 1-2 tablet  1-2 tablet Oral Q6H PRN Karmen Bongo, MD   2 tablet at 03/05/20 0845  . HYDROmorphone (DILAUDID) injection 0.5 mg  0.5 mg Intravenous Q4H PRN Magnant, Charles L, PA-C      . levothyroxine (SYNTHROID) tablet 75 mcg  75 mcg Oral QAC breakfast Karmen Bongo, MD   75 mcg at 03/05/20 7622  . methocarbamol (ROBAXIN) tablet 500 mg  500 mg Oral Q6H PRN Karmen Bongo, MD       Or  . methocarbamol (ROBAXIN) 500 mg in dextrose 5 % 50 mL IVPB  500 mg Intravenous Q6H PRN Karmen Bongo, MD      . metoCLOPramide (REGLAN) tablet 5-10 mg  5-10 mg Oral Q8H PRN Magnant, Charles L, PA-C       Or  . metoCLOPramide (REGLAN) injection 5-10 mg  5-10 mg Intravenous Q8H PRN Magnant, Charles L, PA-C      . morphine 2 MG/ML injection 0.5 mg  0.5 mg Intravenous Q2H PRN Karmen Bongo, MD      . ondansetron [facility]) injection 4 mg  4 mg Intravenous Q6H PRN Karmen Bongo, MD      . polyethylene glycol (MIRALAX / GLYCOLAX) packet 17 g  17 g Oral Daily Darliss Cheney, MD   17 g at 03/05/20 0845  . povidone-iodine 10 % swab 2 application  2 application Topical Once Karmen Bongo, MD      . pravastatin (PRAVACHOL) tablet 10 mg  10 mg Oral Daily Karmen Bongo, MD   10 mg at 03/05/20 0845  . rivaroxaban (XARELTO) tablet 20 mg  20 mg Oral Q supper Karmen Bongo, MD   20 mg at 03/04/20 1811     Discharge Medications: Please see discharge summary for a list of discharge medications.  Relevant Imaging Results:  Relevant Lab Results:   Additional Information SSN: [id number]  Emeterio Reeve, [place]

## 2020-03-06 ENCOUNTER — Ambulatory Visit: Payer: Medicare Other | Admitting: Podiatry

## 2020-03-06 LAB — CBC WITH DIFFERENTIAL/PLATELET
Abs Immature Granulocytes: 0.03 10*3/uL (ref 0.00–0.07)
Basophils Absolute: 0 10*3/uL (ref 0.0–0.1)
Basophils Relative: 0 %
Eosinophils Absolute: 0.1 10*3/uL (ref 0.0–0.5)
Eosinophils Relative: 1 %
HCT: 25 % — ABNORMAL LOW (ref 36.0–46.0)
Hemoglobin: 8 g/dL — ABNORMAL LOW (ref 12.0–15.0)
Immature Granulocytes: 0 %
Lymphocytes Relative: 24 %
Lymphs Abs: 1.7 10*3/uL (ref 0.7–4.0)
MCH: 33.2 pg (ref 26.0–34.0)
MCHC: 32 g/dL (ref 30.0–36.0)
MCV: 103.7 fL — ABNORMAL HIGH (ref 80.0–100.0)
Monocytes Absolute: 0.7 10*3/uL (ref 0.1–1.0)
Monocytes Relative: 10 %
Neutro Abs: 4.7 10*3/uL (ref 1.7–7.7)
Neutrophils Relative %: 65 %
Platelets: 193 10*3/uL (ref 150–400)
RBC: 2.41 MIL/uL — ABNORMAL LOW (ref 3.87–5.11)
RDW: 17.2 % — ABNORMAL HIGH (ref 11.5–15.5)
WBC: 7.2 10*3/uL (ref 4.0–10.5)
nRBC: 0 % (ref 0.0–0.2)

## 2020-03-06 LAB — BASIC METABOLIC PANEL
Anion gap: 6 (ref 5–15)
BUN: 10 mg/dL (ref 8–23)
CO2: 26 mmol/L (ref 22–32)
Calcium: 8.7 mg/dL — ABNORMAL LOW (ref 8.9–10.3)
Chloride: 99 mmol/L (ref 98–111)
Creatinine, Ser: 0.64 mg/dL (ref 0.44–1.00)
GFR calc Af Amer: >60 mL/min (ref >60)
GFR calc non Af Amer: >60 mL/min (ref >60)
Glucose, Bld: 93 mg/dL (ref 70–99)
Potassium: 4 mmol/L (ref 3.5–5.1)
Sodium: 131 mmol/L — ABNORMAL LOW (ref 135–145)

## 2020-03-06 LAB — SARS CORONAVIRUS 2 BY RT PCR (HOSPITAL ORDER, PERFORMED IN ~~LOC~~ HOSPITAL LAB): SARS Coronavirus 2: NEGATIVE

## 2020-03-06 MED ORDER — BISACODYL 10 MG RE SUPP
10.0000 mg | Freq: Once | RECTAL | Status: AC
  Administered 2020-03-06: 10 mg via RECTAL
  Filled 2020-03-06: qty 1

## 2020-03-06 MED ORDER — RIVAROXABAN 15 MG PO TABS
15.0000 mg | ORAL_TABLET | Freq: Every day | ORAL | Status: DC
  Administered 2020-03-06 – 2020-03-07 (×2): 15 mg via ORAL
  Filled 2020-03-06 (×2): qty 1

## 2020-03-06 NOTE — Progress Notes (Signed)
  Subjective: Patient is s/p left knee I&D.  She is POD4.  Patient states her pain is controlled and continues to improve.  She has progressed with her mobility and has walked down the hall to the nursing station.     Objective: Vital signs in last 24 hours: Temp:  [97.9 F (36.6 C)-98.9 F (37.2 C)] 98.1 F (36.7 C) (06/01 0520) Pulse Rate:  [87-107] 87 (06/01 0520) Resp:  [18] 18 (06/01 0520) BP: (100-116)/(54-68) 116/61 (06/01 0520) SpO2:  [94 %-97 %] 94 % (06/01 0520)  Intake/Output from previous day: 05/31 0701 - 06/01 0700 In: 620 [I.V.:620] Out: 1400 [Urine:1400] Intake/Output this shift: Total I/O In: 641.8 [P.O.:360; I.V.:281.8] Out: 1200 [Urine:1200]  Exam:  1+ DP pulse of LLE DF/PF intact Knee immobilzer in place Wound vac in place with suction intact  Labs: Recent Labs    03/04/20 0646 03/05/20 0213 03/06/20 0547  HGB 8.0* 7.8* 8.0*   Recent Labs    03/05/20 0213 03/06/20 0547  WBC 6.3 7.2  RBC 2.35* 2.41*  HCT 23.6* 25.0*  PLT 183 193   Recent Labs    03/05/20 0213 03/05/20 0909 03/05/20 1909 03/06/20 0547  NA 127*   < > 128* 131*  K 4.6  --   --  4.0  CL 95*  --   --  99  CO2 24  --   --  26  BUN 14  --   --  10  CREATININE 0.58  --   --  0.64  GLUCOSE 112*  --   --  93  CALCIUM 8.8*  --   --  8.7*   < > = values in this interval not displayed.   No results for input(s): LABPT, INR in the last 72 hours.  Assessment/Plan: Plan for discharge to SNF Remove wound vac tomorrow and replace with waterproof bandage such as Aquacel F/u with Dr. August Saucer in clinic ~1 week postop   Joycie Peek Jani Moronta 03/06/2020, 11:24 AM

## 2020-03-06 NOTE — Progress Notes (Signed)
Physical Therapy Treatment Patient Details Name: Jack Mineau MRN: [id number] DOB: 1924-11-03 Today's Date: 03/06/2020    History of Present Illness Pt is a 86 y/o female admitted secondary to a L knee hematoma after sustaining a fall at home several day prior. Pt is now s/p hematoma evacuation with wound VAC placement. PMH including but not limited to a-fib, HTN and HLD.    PT Comments    Pt more anxious and frustrated this session, requiring max encouragement and more physical assistance to power into standing. Educated pt and pt's daughter of importance of being OOB in recliner chair and both agreeable to sitting up at least one hour. Pt would continue to benefit from skilled physical therapy services at this time while admitted and after d/c to address the below listed limitations in order to improve overall safety and independence with functional mobility.    Follow Up Recommendations  SNF;Supervision/Assistance - 24 hour     Equipment Recommendations  Rolling walker with 5" wheels;3in1 (PT);Wheelchair (measurements PT);Wheelchair cushion (measurements PT);Other (comment)(w/c with elevating leg rests)    Recommendations for Other Services       Precautions / Restrictions Precautions Precautions: Fall Precaution Comments: wound VAC Required Braces or Orthoses: Knee Immobilizer - Left Knee Immobilizer - Left: On at all times;On when out of bed or walking;Other (comment)(pt is not allowed any knee flexion at all) Restrictions Weight Bearing Restrictions: Yes LLE Weight Bearing: Weight bearing as tolerated Other Position/Activity Restrictions: with KI donned    Mobility  Bed Mobility Overal bed mobility: Needs Assistance Bed Mobility: Supine to Sit     Supine to sit: Min assist     General bed mobility comments: Min A for assistance to manage LLE over EOB  Transfers Overall transfer level: Needs assistance Equipment used: Rolling walker (2 wheeled) Transfers: Sit  to/from Stand Sit to Stand: From elevated surface;Max assist         General transfer comment: heavy assistance and encouragement needed to stand from EOB  Ambulation/Gait Ambulation/Gait assistance: Min guard Gait Distance (Feet): 20 Feet Assistive device: Rolling walker (2 wheeled) Gait Pattern/deviations: Step-to pattern;Decreased step length - right;Decreased stance time - left;Decreased stride length;Decreased weight shift to left;Trunk flexed Gait velocity: decreased   General Gait Details: max encouragement needed; pt distracted throughout, worrying about sitting in the recliner chair and daughter not having a seat (despite having another chair and a couch in the room)   Stairs             Wheelchair Mobility    Modified Rankin (Stroke Patients Only)       Balance Overall balance assessment: Needs assistance Sitting-balance support: Feet supported Sitting balance-Leahy Scale: Good     Standing balance support: During functional activity;Bilateral upper extremity supported Standing balance-Leahy Scale: Poor                              Cognition Arousal/Alertness: Awake/alert Behavior During Therapy: WFL for tasks assessed/performed Overall Cognitive Status: Impaired/Different from baseline Area of Impairment: Memory                     Memory: Decreased short-term memory                Exercises      General Comments        Pertinent Vitals/Pain Pain Assessment: Faces Faces Pain Scale: Hurts little more Pain Location: L knee Pain Descriptors / Indicators: Sore;Guarding  Pain Intervention(s): Monitored during session;Repositioned    Home Living                      Prior Function            PT Goals (current goals can now be found in the care plan section) Acute Rehab PT Goals PT Goal Formulation: With patient/family Time For Goal Achievement: 03/17/20 Potential to Achieve Goals: Good Progress towards  PT goals: Progressing toward goals    Frequency    Min 3X/week      PT Plan Current plan remains appropriate    Co-evaluation              AM-PAC PT "6 Clicks" Mobility   Outcome Measure  Help needed turning from your back to your side while in a flat bed without using bedrails?: A Little Help needed moving from lying on your back to sitting on the side of a flat bed without using bedrails?: A Little Help needed moving to and from a bed to a chair (including a wheelchair)?: A Little Help needed standing up from a chair using your arms (e.g., wheelchair or bedside chair)?: A Lot Help needed to walk in hospital room?: A Little Help needed climbing 3-5 steps with a railing? : A Lot 6 Click Score: 16    End of Session Equipment Utilized During Treatment: Gait belt;Left knee immobilizer Activity Tolerance: Patient tolerated treatment well Patient left: in chair;with call bell/phone within reach;with family/visitor present Nurse Communication: Mobility status PT Visit Diagnosis: Other abnormalities of gait and mobility (R26.89);Pain Pain - Right/Left: Left Pain - part of body: Knee     Time: 0821-0845 PT Time Calculation (min) (ACUTE ONLY): 24 min  Charges:  $Gait Training: 8-22 mins $Therapeutic Activity: 8-22 mins                     Arletta Bale, DPT  Acute Rehabilitation Services Pager [phone] Office [phone]     Alessandra Bevels Lyric Hoar 03/06/2020, 12:08 PM

## 2020-03-06 NOTE — Progress Notes (Signed)
PROGRESS NOTE    Grace Alvarado  MHD:[id number] DOB: [birth year] DOA: 03/02/2020 PCP: Pincus Sanes, MD   Brief Narrative:  Grace Alvarado is a 86 y.o. female with medical history significant of hypothyroidism; HTN; HLD; and afib on Xarelto presented with left knee pain/ infection.   She fell off the chair 2 weeks prior to presentation. She called 911 and seemed to be ok.  The left knee started swelling and so they came to the ER on May 19 and she was discharged her.  She followed up with Dr. Denyse Amass later on 5/21.  She was given an antibiotic for traumatic prepatellar bursitis.  It was getting worse and so they were seen again on 03/01/2020; she was given Clindamycin and discharged.  She followed up again and it got worse again and so she was sent to ED on 03/02/2020.  No fever.  Upon arrival to ED, case was discussed between ED physician and orthopedics and Dr. August Saucer of orthopedics recommended admission as he plans to do incision and drainage in the OR.  Patient was then admitted under hospital service.  Underwent of left hematoma and wound VAC placement on 03/02/2020.  Assessment & Plan:   Principal Problem:   Prepatellar bursitis, left knee Active Problems:   Chronic a-fib   Essential hypertension   Hypothyroid   Dyslipidemia  Prepatellar bursitis: Status post evacuation of left hematoma and wound VAC placement.  Pain fairly controlled.  Management deferred to orthopedics.  Paroxysmal atrial fibrillation: In sinus rhythm.  Continue Cardizem and  Xarelto.  Acute hyponatremia: Sodium dropped to 125 lowest on 03/04/2020.  Based on the studies, this does not seem to be SIADH.  Started on normal saline.  Sodium improved to 131 today.  Check every 8 hours.  Stop IV fluids.  Acute urinary retention/constipation: Patient could not pass urine and needed straight cath on the night of 03/02/2020 and then again she was retaining in the afternoon of 03/03/2020 so Foley catheter was inserted.  This was removed  yesterday on 03/05/2020.  Patient had another episode of urinary retention this morning.  Straight cath was done and 800 cc was retrieved.  Discussed in length with patient.  Patient tends to hold her urine while she is having pure wick.  She did not understand how that worked.  She was educated by nurse and me.  Besides this, patient has been having constipation for last few days.  She is getting oral laxatives however I have been encouraging Dulcolax suppository which patient had declined.  After long discussion, she agrees to that.  I did explain to her that constipation may also be causing acute urinary retention.  Will order suppository Dulcolax.  If this does not work, we will try lactulose p.o.  Essential HTN:  -Controlled.  Continue Cardizem  HLD -Continue Pravachol  Hypothyroidism -Continue Synthroid at current dose for now  DVT prophylaxis: xarelto   Code Status: DNR  Family Communication: Daughter present at bedside.  Plan of care discussed with patient in length and she verbalized understanding and agreed with it. Patient is from: Home Disposition Plan: To SNF. Barriers to discharge: Placement.  Status is: Inpatient  Remains inpatient appropriate because:Ongoing active pain requiring inpatient pain management   Dispo: The patient is from: Home              Anticipated d/c is to: SNF              Anticipated d/c date is: 1 day  Patient currently is medically stable to d/c.         Estimated body mass index is 23.56 kg/m as calculated from the following:   Height as of this encounter: 5\' 3"  (1.6 m).   Weight as of this encounter: 60.3 kg.      Nutritional status:               Consultants:   Orthopedics  Procedures:   Left knee evacuation  Antimicrobials:  Anti-infectives (From admission, onward)   Start     Dose/Rate Route Frequency Ordered Stop   03/03/20 2230  clindamycin (CLEOCIN) IVPB 600 mg     600 mg 100 mL/hr over 30  Minutes Intravenous Every 8 hours 03/02/20 2135 03/04/20 0737   03/02/20 1937  tobramycin (NEBCIN) powder  Status:  Discontinued       As needed 03/02/20 1937 03/02/20 2019   03/02/20 1130  clindamycin (CLEOCIN) IVPB 900 mg     900 mg 100 mL/hr over 30 Minutes Intravenous On call to O.R. 03/02/20 1123 03/02/20 1940   03/02/20 1130  clindamycin (CLEOCIN) IVPB 300 mg     300 mg 100 mL/hr over 30 Minutes Intravenous  Once 03/02/20 1109 03/02/20 1407         Subjective: Seen and examined.  Daughter at the bedside.  Left knee pain is well controlled.  Complaint of urinary retention and constipation.  Objective: Vitals:   03/05/20 1310 03/05/20 1723 03/05/20 2357 03/06/20 0520  BP: (!) 102/54 104/61 100/68 116/61  Pulse: 96 (!) 107 92 87  Resp: 18 18 18 18   Temp: 98.9 F (37.2 C) 97.9 F (36.6 C) 98.3 F (36.8 C) 98.1 F (36.7 C)  TempSrc: Oral Oral Oral Oral  SpO2: 96% 97% 95% 94%  Weight:      Height:        Intake/Output Summary (Last 24 hours) at 03/06/2020 1204 Last data filed at 03/06/2020 1055 Gross per 24 hour  Intake 1261.79 ml  Output 2600 ml  Net -1338.21 ml   Filed Weights   03/02/20 0843 03/02/20 1734  Weight: 60.3 kg 60.3 kg    Examination:  General exam: Appears calm and comfortable  Respiratory system: Clear to auscultation. Respiratory effort normal. Cardiovascular system: S1 & S2 heard, RRR. No JVD, murmurs, rubs, gallops or clicks. No pedal edema. Gastrointestinal system: Abdomen is nondistended, soft and nontender. No organomegaly or masses felt. Normal bowel sounds heard. Central nervous system: Alert and oriented. No focal neurological deficits. Extremities: Ace wrap in the left knee. Skin: No rashes, lesions or ulcers.  Psychiatry: Judgement and insight appear normal. Mood & affect appropriate.    Data Reviewed: I have personally reviewed following labs and imaging studies  CBC: Recent Labs  Lab 03/02/20 0853 03/03/20 0810 03/04/20 0646  03/05/20 0213 03/06/20 0547  WBC 5.9 3.7* 8.7 6.3 7.2  NEUTROABS 4.3  --  7.4 4.9 4.7  HGB 8.9* 8.2* 8.0* 7.8* 8.0*  HCT 27.1* 25.1* 24.5* 23.6* 25.0*  MCV 101.9* 101.6* 101.2* 100.4* 103.7*  PLT 139* 145* 174 183 193   Basic Metabolic Panel: Recent Labs  Lab 03/02/20 0853 03/02/20 0853 03/03/20 0810 03/03/20 1236 03/04/20 0646 03/04/20 1408 03/05/20 0213 03/05/20 0909 03/05/20 1438 03/05/20 1909 03/06/20 0547  NA 131*   < > 129*   < > 125*   < > 127* 129* 131* 128* 131*  K 3.9  --  4.7  --  4.9  --  4.6  --   --   --  4.0  CL 98  --  95*  --  95*  --  95*  --   --   --  99  CO2 25  --  25  --  25  --  24  --   --   --  26  GLUCOSE 119*  --  198*  --  147*  --  112*  --   --   --  93  BUN 13  --  14  --  14  --  14  --   --   --  10  CREATININE 0.63  --  0.63  --  0.63  --  0.58  --   --   --  0.64  CALCIUM 9.2  --  9.4  --  8.8*  --  8.8*  --   --   --  8.7*   < > = values in this interval not displayed.   GFR: Estimated Creatinine Clearance: 37.1 mL/min (by C-G formula based on SCr of 0.64 mg/dL). Liver Function Tests: Recent Labs  Lab 03/02/20 0853  AST 22  ALT 21  ALKPHOS 54  BILITOT 1.5*  PROT 6.5  ALBUMIN 3.3*   No results for input(s): LIPASE, AMYLASE in the last 168 hours. No results for input(s): AMMONIA in the last 168 hours. Coagulation Profile: No results for input(s): INR, PROTIME in the last 168 hours. Cardiac Enzymes: No results for input(s): CKTOTAL, CKMB, CKMBINDEX, TROPONINI in the last 168 hours. BNP (last 3 results) No results for input(s): PROBNP in the last 8760 hours. HbA1C: No results for input(s): HGBA1C in the last 72 hours. CBG: No results for input(s): GLUCAP in the last 168 hours. Lipid Profile: No results for input(s): CHOL, HDL, LDLCALC, TRIG, CHOLHDL, LDLDIRECT in the last 72 hours. Thyroid Function Tests: No results for input(s): TSH, T4TOTAL, FREET4, T3FREE, THYROIDAB in the last 72 hours. Anemia Panel: No results for  input(s): VITAMINB12, FOLATE, FERRITIN, TIBC, IRON, RETICCTPCT in the last 72 hours. Sepsis Labs: No results for input(s): PROCALCITON, LATICACIDVEN in the last 168 hours.  Recent Results (from the past 240 hour(s))  Wound culture     Status: None   Collection Time: 03/01/20  4:50 PM   Specimen: Wound  Result Value Ref Range Status   MICRO NUMBER: [id number]  Final   SPECIMEN QUALITY: Adequate  Final   SOURCE: WOUND (SITE NOT SPECIFIED)  Final   STATUS: FINAL  Final   GRAM STAIN:   Final    No white blood cells seen No epithelial cells seen No organisms seen   RESULT: No Growth  Final  SARS Coronavirus 2 by RT PCR (hospital order, performed in [place] hospital lab) Nasopharyngeal Nasopharyngeal Swab     Status: None   Collection Time: 03/02/20 10:55 AM   Specimen: Nasopharyngeal Swab  Result Value Ref Range Status   SARS Coronavirus 2 NEGATIVE NEGATIVE Final    Comment: (NOTE) SARS-CoV-2 target nucleic acids are NOT DETECTED. The SARS-CoV-2 RNA is generally detectable in upper and lower respiratory specimens during the acute phase of infection. The lowest concentration of SARS-CoV-2 viral copies this assay can detect is 250 copies / mL. A negative result does not preclude SARS-CoV-2 infection and should not be used as the sole basis for treatment or other patient management decisions.  A negative result may occur with improper specimen collection / handling, submission of specimen other than nasopharyngeal swab, presence of viral mutation(s) within the areas targeted by this  assay, and inadequate number of viral copies (<250 copies / mL). A negative result must be combined with clinical observations, patient history, and epidemiological information. Fact Sheet for Patients:   BoilerBrush.com.cy Fact Sheet for Healthcare Providers: https://pope.com/ This test is not yet approved or cleared  by the [place] FDA and has been  authorized for detection and/or diagnosis of SARS-CoV-2 by FDA under an Emergency Use Authorization (EUA).  This EUA will remain in effect (meaning this test can be used) for the duration of the COVID-19 declaration under Section 564(b)(1) of the Act, 21 U.S.C. section 360bbb-3(b)(1), unless the authorization is terminated or revoked sooner. Performed at [facility], [address], [place], [place] [address]   Aerobic/Anaerobic Culture (surgical/deep wound)     Status: None (Preliminary result)   Collection Time: 03/02/20  7:35 PM   Specimen: Soft Tissue, Other  Result Value Ref Range Status   Specimen Description KNEE  Final   Special Requests PRE PATELLA HEMATOMA LEFT  Final   Gram Stain   Final    RARE WBC PRESENT, PREDOMINANTLY PMN RARE GRAM POSITIVE COCCI    Culture   Final    NO GROWTH 3 DAYS Performed at [facility], [address], [place], [place] [address]    Report Status PENDING  Incomplete      Radiology Studies: No results found.  Scheduled Meds: . calcium carbonate  1 tablet Oral BID  . chlorhexidine  60 mL Topical Once  . Chlorhexidine Gluconate Cloth  6 each Topical Daily  . diltiazem  120 mg Oral BID  . docusate sodium  100 mg Oral BID  . levothyroxine  75 mcg Oral QAC breakfast  . polyethylene glycol  17 g Oral Daily  . povidone-iodine  2 application Topical Once  . pravastatin  10 mg Oral Daily  . rivaroxaban  15 mg Oral Q supper   Continuous Infusions: . sodium chloride 75 mL/hr at 03/06/20 0324  . methocarbamol (ROBAXIN) IV       LOS: 4 days   Time spent: 29 minutes    Hughie Closs, MD [facility]  03/06/2020, 12:04 PM   To contact the attending provider between 7A-7P or the covering provider during after hours 7P-7A, please log into the web site [email or web address].

## 2020-03-06 NOTE — Progress Notes (Signed)
Pt c.o "burning or pain down there" pointing to both her bladder area and vaginal area. Writer removed purewick and checked vaginal area; no redness observed. New purewick placed and writer explained what and how the purewick works. Pt stated that she understood now and pt then released urine. She then stated "oh the pain is gone". Shortly after pt stated "its hurting again". Once pt released urine she stated "the pain is gone again". Pain pill given to pt to assist with stated lingering pain. Monitoring continues.

## 2020-03-06 NOTE — TOC Progression Note (Signed)
Transition of Care [facility]) - Progression Note    Patient Details  Name: Marvel Sapp MRN: [id number] Date of Birth: 05-Nov-1924  Transition of Care [facility]) CM/SW Contact  Doy Hutching, [place] Phone Number: 03/06/2020, 2:51 PM  Clinical Narrative:    1:58am- CSW spoke with [facility], they likely will not have a bed available but will review pt clinicals. CSW spoke with Diane again, explained this. Diane inquired about [facility]. CSW explained [facility] not taking admissions at this time due to expansion of SNF. CSW again encouraged her to review and pick two choices from list. Pt daughter interested in ratings, CSW f/u with [facility] as it is top rated in regards to visitation policy and communicated this with daughter. New COVID swab ordered per protocol.   11:55am- CSW spoke with pt daughter Wendi Maya and pt via room phone. They directed me to speak with Angelique Blonder or Diane regarding discharge planning. CSW spoke with [facility] via telephone [phone]). Introduced self, role, reason for call. [name] understands recs and is interested in getting more information about [facility] but understands that they often do not accept pts outside of their community. Angelique Blonder was amenable to getting CMS link and list of SNFs via text.   CSW was then contacted by Sedalia Muta [phone]), who states she had spoken with [facility] and that they have a bed. CSW explained again that often times they are unable to take individuals that are not part of their community but I would follow up. Encouraged Diane to work with her sisters and pick at least a top two other choice from list provided. Diane states understanding. Pt has had COVID vaccine.  CSW then started auth with [facility] [phone]) and faxed in clinicals. CSW also left a message with Matilde Sprang at [facility].    Expected Discharge Plan: Skilled Nursing Facility Barriers to Discharge: Continued Medical Work up  Expected Discharge Plan and Services Expected  Discharge Plan: Skilled Nursing Facility  Living arrangements for the past 2 months: Apartment   Readmission Risk Interventions Readmission Risk Prevention Plan 03/06/2020  Transportation Screening Complete  PCP or Specialist Appt within 5-7 Days Not Complete  Not Complete comments plan for SNF  Home Care Screening Complete  Medication Review (RN CM) Referral to Pharmacy  Some recent data might be hidden

## 2020-03-07 DIAGNOSIS — Z66 Do not resuscitate: Secondary | ICD-10-CM

## 2020-03-07 DIAGNOSIS — E871 Hypo-osmolality and hyponatremia: Secondary | ICD-10-CM

## 2020-03-07 LAB — CBC WITH DIFFERENTIAL/PLATELET
Abs Immature Granulocytes: 0.03 10*3/uL (ref 0.00–0.07)
Basophils Absolute: 0 10*3/uL (ref 0.0–0.1)
Basophils Relative: 0 %
Eosinophils Absolute: 0.1 10*3/uL (ref 0.0–0.5)
Eosinophils Relative: 1 %
HCT: 26.7 % — ABNORMAL LOW (ref 36.0–46.0)
Hemoglobin: 8.6 g/dL — ABNORMAL LOW (ref 12.0–15.0)
Immature Granulocytes: 0 %
Lymphocytes Relative: 16 %
Lymphs Abs: 1.2 10*3/uL (ref 0.7–4.0)
MCH: 33.3 pg (ref 26.0–34.0)
MCHC: 32.2 g/dL (ref 30.0–36.0)
MCV: 103.5 fL — ABNORMAL HIGH (ref 80.0–100.0)
Monocytes Absolute: 0.6 10*3/uL (ref 0.1–1.0)
Monocytes Relative: 8 %
Neutro Abs: 5.6 10*3/uL (ref 1.7–7.7)
Neutrophils Relative %: 75 %
Platelets: 228 10*3/uL (ref 150–400)
RBC: 2.58 MIL/uL — ABNORMAL LOW (ref 3.87–5.11)
RDW: 17.9 % — ABNORMAL HIGH (ref 11.5–15.5)
WBC: 7.5 10*3/uL (ref 4.0–10.5)
nRBC: 0.3 % — ABNORMAL HIGH (ref 0.0–0.2)

## 2020-03-07 LAB — BASIC METABOLIC PANEL
Anion gap: 8 (ref 5–15)
BUN: 8 mg/dL (ref 8–23)
CO2: 28 mmol/L (ref 22–32)
Calcium: 8.8 mg/dL — ABNORMAL LOW (ref 8.9–10.3)
Chloride: 95 mmol/L — ABNORMAL LOW (ref 98–111)
Creatinine, Ser: 0.66 mg/dL (ref 0.44–1.00)
GFR calc Af Amer: >60 mL/min (ref >60)
GFR calc non Af Amer: >60 mL/min (ref >60)
Glucose, Bld: 139 mg/dL — ABNORMAL HIGH (ref 70–99)
Potassium: 4 mmol/L (ref 3.5–5.1)
Sodium: 131 mmol/L — ABNORMAL LOW (ref 135–145)

## 2020-03-07 MED ORDER — METOPROLOL TARTRATE 12.5 MG HALF TABLET
12.5000 mg | ORAL_TABLET | Freq: Two times a day (BID) | ORAL | Status: DC
  Administered 2020-03-07 – 2020-03-08 (×2): 12.5 mg via ORAL
  Filled 2020-03-07 (×2): qty 1

## 2020-03-07 MED ORDER — PANTOPRAZOLE SODIUM 40 MG PO TBEC
40.0000 mg | DELAYED_RELEASE_TABLET | Freq: Two times a day (BID) | ORAL | Status: DC
  Administered 2020-03-07 – 2020-03-08 (×3): 40 mg via ORAL
  Filled 2020-03-07 (×3): qty 1

## 2020-03-07 MED ORDER — FLEET ENEMA 7-19 GM/118ML RE ENEM
1.0000 | ENEMA | Freq: Once | RECTAL | Status: AC
  Administered 2020-03-07: 1 via RECTAL
  Filled 2020-03-07: qty 1

## 2020-03-07 NOTE — Progress Notes (Signed)
OT Cancellation Note  Patient Details Name: Myisha Pickerel MRN: [id number] DOB: 1924-11-30   Cancelled Treatment:    Reason Eval/Treat Not Completed: Patient declined, no reason specified(Pt and daughter confirm that pt has just laid down after ADL tasks. Pt was resting comfortably and wanted to continue. OT to continue to follow for next available treatment time.  Flora Lipps, OTR/L Acute Rehabilitation Services Pager: [phone] Office: [phone]   Jayleah Garbers C 03/07/2020, 11:04 AM

## 2020-03-07 NOTE — TOC Progression Note (Signed)
Transition of Care [facility]) - Progression Note    Patient Details  Name: Najat Olazabal MRN: [id number] Date of Birth: [birth year]  Transition of Care [facility]) CM/SW Contact  Doy Hutching, [place] Phone Number: 03/07/2020, 10:39 AM  Clinical Narrative:    [facility] received for pt, per daughters their choices were 1) [facility], 2) [name], 3) [facility]. CSW updated daughter Sedalia Muta that [facility] had declined per admissions liaison [address] CSW has been in touch with [facility] liaison Marylene Land and she has confirmed offer and bed availability.   Auth ref # [id number] 6/2-6/4 approval.    Expected Discharge Plan: Skilled Nursing Facility Barriers to Discharge: Continued Medical Work up  Expected Discharge Plan and Services Expected Discharge Plan: Skilled Nursing Facility Living arrangements for the past 2 months: Apartment   Readmission Risk Interventions Readmission Risk Prevention Plan 03/06/2020  Transportation Screening Complete  PCP or Specialist Appt within 5-7 Days Not Complete  Not Complete comments plan for SNF  Home Care Screening Complete  Medication Review (RN CM) Referral to Pharmacy  Some recent data might be hidden

## 2020-03-07 NOTE — Progress Notes (Signed)
Physical Therapy Treatment Patient Details Name: Grace Alvarado MRN: [id number] DOB: 1925/01/03 Today's Date: 03/07/2020    History of Present Illness Pt is a 86 y/o female admitted secondary to a L knee hematoma after sustaining a fall at home several day prior. Pt is now s/p hematoma evacuation with wound VAC placement. PMH including but not limited to a-fib, HTN and HLD.    PT Comments    Pt making slow progress with functional mobility. She remains very limited overall secondary to pain, fatigue and weakness. Additionally limited this session secondary to incontinence. Pt's daughter present throughout and stating that pt will be d/c'ing to [facility]. Pt would continue to benefit from skilled physical therapy services at this time while admitted and after d/c to address the below listed limitations in order to improve overall safety and independence with functional mobility.    Follow Up Recommendations  SNF;Supervision/Assistance - 24 hour     Equipment Recommendations  Other (comment)(defer to next venue of care)    Recommendations for Other Services       Precautions / Restrictions Precautions Precautions: Fall Precaution Comments: wound VAC Required Braces or Orthoses: Knee Immobilizer - Left Knee Immobilizer - Left: On at all times;On when out of bed or walking;Other (comment)(pt is not allowed any knee flexion at all) Restrictions Weight Bearing Restrictions: Yes LLE Weight Bearing: Weight bearing as tolerated    Mobility  Bed Mobility Overal bed mobility: Needs Assistance Bed Mobility: Supine to Sit     Supine to sit: Min assist     General bed mobility comments: Min A for assistance to manage LLE over EOB  Transfers Overall transfer level: Needs assistance Equipment used: Rolling walker (2 wheeled) Transfers: Sit to/from Stand Sit to Stand: From elevated surface;Max assist         General transfer comment: pt performed x1 from EOB and x1 from [facility]; max  encouragement needed, use of momentum and max A to power into standing  Ambulation/Gait Ambulation/Gait assistance: Min guard Gait Distance (Feet): 20 Feet Assistive device: Rolling walker (2 wheeled) Gait Pattern/deviations: Step-to pattern;Decreased step length - right;Decreased stance time - left;Decreased stride length;Decreased weight shift to left;Trunk flexed Gait velocity: decreased   General Gait Details: max encouragement needed; pt becoming incontinent of urine initially and needed to sit on BSC to finish voiding. Cueing for more upright posture with minimal correction   Stairs             Wheelchair Mobility    Modified Rankin (Stroke Patients Only)       Balance Overall balance assessment: Needs assistance Sitting-balance support: Feet supported Sitting balance-Leahy Scale: Fair     Standing balance support: During functional activity;Bilateral upper extremity supported Standing balance-Leahy Scale: Poor                              Cognition Arousal/Alertness: Awake/alert Behavior During Therapy: WFL for tasks assessed/performed Overall Cognitive Status: Impaired/Different from baseline Area of Impairment: Memory;Safety/judgement                     Memory: Decreased short-term memory   Safety/Judgement: Decreased awareness of safety            Exercises      General Comments        Pertinent Vitals/Pain Pain Assessment: Faces Faces Pain Scale: Hurts little more Pain Location: L knee Pain Descriptors / Indicators: Sore;Guarding Pain Intervention(s): Monitored during  session;Repositioned    Home Living                      Prior Function            PT Goals (current goals can now be found in the care plan section) Acute Rehab PT Goals PT Goal Formulation: With patient/family Time For Goal Achievement: 03/17/20 Potential to Achieve Goals: Good Progress towards PT goals: Progressing toward goals     Frequency    Min 3X/week      PT Plan Current plan remains appropriate    Co-evaluation              AM-PAC PT "6 Clicks" Mobility   Outcome Measure  Help needed turning from your back to your side while in a flat bed without using bedrails?: A Little Help needed moving from lying on your back to sitting on the side of a flat bed without using bedrails?: A Little Help needed moving to and from a bed to a chair (including a wheelchair)?: A Little Help needed standing up from a chair using your arms (e.g., wheelchair or bedside chair)?: A Lot Help needed to walk in hospital room?: A Little Help needed climbing 3-5 steps with a railing? : A Lot 6 Click Score: 16    End of Session Equipment Utilized During Treatment: Gait belt;Left knee immobilizer Activity Tolerance: Patient tolerated treatment well Patient left: in chair;with call bell/phone within reach;with family/visitor present Nurse Communication: Mobility status PT Visit Diagnosis: Other abnormalities of gait and mobility (R26.89);Pain Pain - Right/Left: Left Pain - part of body: Knee     Time: 0258-5277 PT Time Calculation (min) (ACUTE ONLY): 29 min  Charges:  $Gait Training: 8-22 mins $Therapeutic Activity: 8-22 mins                     Arletta Bale, DPT  Acute Rehabilitation Services Pager [phone] Office [phone]     Alessandra Bevels Clemma Johnsen 03/07/2020, 3:24 PM

## 2020-03-07 NOTE — Progress Notes (Signed)
Progress Note    Grace Alvarado  QVZ:[id number] DOB: July 29, 1925  DOA: 03/02/2020 PCP: Pincus Sanes, MD    Brief Narrative:    Medical records reviewed and are as summarized below:  Grace Alvarado is an 86 y.o. female with medical history significant ofhypothyroidism; HTN; HLD; and afib on Xarelto presented with left knee pain/ infection.  She fell off the chair 2 weeks prior to presentation.The left knee started swelling and so they came to the ER on May 19 and she was discharged her. She followed up with Dr. Denyse Amass later on 5/21. She was given an antibiotic for traumatic prepatellar bursitis. It was getting worse and so they were seen again on 03/01/2020; she was given Clindamycin and discharged. She followed up again and it got worse again and so she was sent to ED on 03/02/2020.  Upon arrival to ED, case was discussed between ED physician and orthopedics and Dr. August Saucer of orthopedics recommended incision and drainage in the OR.   Underwent of left hematoma and wound VAC placement on 03/02/2020.  Assessment/Plan:   Principal Problem:   Prepatellar bursitis, left knee Active Problems:   Chronic a-fib   Essential hypertension   Hypothyroid   Dyslipidemia   Hyponatremia   DNR (do not resuscitate)   Prepatellar bursitis: Status post evacuation of left hematoma and wound VAC placement.  Pain fairly controlled.  Management deferred to orthopedics (per chart review plan is to remove the wound VAC today  Paroxysmal atrial fibrillation: Continue Cardizem and  Xarelto.  Acute hyponatremia: Sodium dropped to 125 lowest on 03/04/2020.    Per Dr. Jacqulyn Bath does not seem to be SIADH.    Today's labs are still pending  Acute urinary retention/constipation: Patient could not pass urine and needed straight cath on the night of 03/02/2020 and then again she was retaining in the afternoon of 03/03/2020 so Foley catheter was inserted.  This was removed  on 03/05/2020.    She is getting oral laxatives  and Dulcolax suppository.  Patient had a small bowel movement.  Will do enema today  Essential HTN:  -Controlled.  Continue Cardizem  HLD -Continue Pravachol  Hypothyroidism -Continue Synthroid  GERD -Protonix   Family Communication/Anticipated D/C date and plan/Code Status   DVT prophylaxis: Xarelto Code Status: DNR.  Family Communication: Daughters are at bedside Disposition Plan: Status is: Inpatient  Remains inpatient appropriate because:Unsafe d/c plan   Dispo: The patient is from: Home              Anticipated d/c is to: SNF              Anticipated d/c date is: 1 day              Patient currently is not medically stable to d/c.  (Await today's labs and bowel movement as well as removal of wound VAC)          Medical Consultants:    Ortho     Subjective:   Says she does not have much of an appetite and is complaining of GERD-like symptoms  Objective:    Vitals:   03/06/20 1238 03/06/20 1754 03/07/20 0006 03/07/20 0544  BP: (!) 131/50 122/69 123/60 127/65  Pulse: 85 77 89 91  Resp: 18 18 16 16   Temp: 97.9 F (36.6 C) 98.8 F (37.1 C) 97.8 F (36.6 C) 98 F (36.7 C)  TempSrc: Oral Oral Oral Oral  SpO2: 98% 97% 98% 92%  Weight:  Height:        Intake/Output Summary (Last 24 hours) at 03/07/2020 0815 Last data filed at 03/06/2020 1735 Gross per 24 hour  Intake 1121.76 ml  Output 1675 ml  Net -553.24 ml   Filed Weights   03/02/20 0843 03/02/20 1734  Weight: 60.3 kg 60.3 kg    Exam:  General: Appearance:    Well developed, well nourished female in no acute distress   No thrush in mouth  Lungs:     Clear to auscultation bilaterally, respirations unlabored  Heart:   rrr   MS:   All extremities are intact. Left leg in brace  Neurologic:   Awake, alert, oriented x 3. No apparent focal neurological           defect.     Data Reviewed:   I have personally reviewed following labs and imaging studies:  Labs: Labs show the  following:   Basic Metabolic Panel: Recent Labs  Lab 03/02/20 0853 03/02/20 0853 03/03/20 0810 03/03/20 1236 03/04/20 0646 03/04/20 1408 03/05/20 0213 03/05/20 0909 03/05/20 1438 03/05/20 1909 03/06/20 0547  NA 131*   < > 129*   < > 125*   < > 127* 129* 131* 128* 131*  K 3.9   < > 4.7  --  4.9  --  4.6  --   --   --  4.0  CL 98  --  95*  --  95*  --  95*  --   --   --  99  CO2 25  --  25  --  25  --  24  --   --   --  26  GLUCOSE 119*  --  198*  --  147*  --  112*  --   --   --  93  BUN 13  --  14  --  14  --  14  --   --   --  10  CREATININE 0.63  --  0.63  --  0.63  --  0.58  --   --   --  0.64  CALCIUM 9.2  --  9.4  --  8.8*  --  8.8*  --   --   --  8.7*   < > = values in this interval not displayed.   GFR Estimated Creatinine Clearance: 37.1 mL/min (by C-G formula based on SCr of 0.64 mg/dL). Liver Function Tests: Recent Labs  Lab 03/02/20 0853  AST 22  ALT 21  ALKPHOS 54  BILITOT 1.5*  PROT 6.5  ALBUMIN 3.3*   No results for input(s): LIPASE, AMYLASE in the last 168 hours. No results for input(s): AMMONIA in the last 168 hours. Coagulation profile No results for input(s): INR, PROTIME in the last 168 hours.  CBC: Recent Labs  Lab 03/02/20 0853 03/03/20 0810 03/04/20 0646 03/05/20 0213 03/06/20 0547  WBC 5.9 3.7* 8.7 6.3 7.2  NEUTROABS 4.3  --  7.4 4.9 4.7  HGB 8.9* 8.2* 8.0* 7.8* 8.0*  HCT 27.1* 25.1* 24.5* 23.6* 25.0*  MCV 101.9* 101.6* 101.2* 100.4* 103.7*  PLT 139* 145* 174 183 193   Cardiac Enzymes: No results for input(s): CKTOTAL, CKMB, CKMBINDEX, TROPONINI in the last 168 hours. BNP (last 3 results) No results for input(s): PROBNP in the last 8760 hours. CBG: No results for input(s): GLUCAP in the last 168 hours. D-Dimer: No results for input(s): DDIMER in the last 72 hours. Hgb A1c: No results for input(s): HGBA1C in the last  72 hours. Lipid Profile: No results for input(s): CHOL, HDL, LDLCALC, TRIG, CHOLHDL, LDLDIRECT in the last 72  hours. Thyroid function studies: No results for input(s): TSH, T4TOTAL, T3FREE, THYROIDAB in the last 72 hours.  Invalid input(s): FREET3 Anemia work up: No results for input(s): VITAMINB12, FOLATE, FERRITIN, TIBC, IRON, RETICCTPCT in the last 72 hours. Sepsis Labs: Recent Labs  Lab 03/03/20 0810 03/04/20 0646 03/05/20 0213 03/06/20 0547  WBC 3.7* 8.7 6.3 7.2    Microbiology Recent Results (from the past 240 hour(s))  Wound culture     Status: None   Collection Time: 03/01/20  4:50 PM   Specimen: Wound  Result Value Ref Range Status   MICRO NUMBER: [id number]  Final   SPECIMEN QUALITY: Adequate  Final   SOURCE: WOUND (SITE NOT SPECIFIED)  Final   STATUS: FINAL  Final   GRAM STAIN:   Final    No white blood cells seen No epithelial cells seen No organisms seen   RESULT: No Growth  Final  SARS Coronavirus 2 by RT PCR (hospital order, performed in [place] hospital lab) Nasopharyngeal Nasopharyngeal Swab     Status: None   Collection Time: 03/02/20 10:55 AM   Specimen: Nasopharyngeal Swab  Result Value Ref Range Status   SARS Coronavirus 2 NEGATIVE NEGATIVE Final    Comment: (NOTE) SARS-CoV-2 target nucleic acids are NOT DETECTED. The SARS-CoV-2 RNA is generally detectable in upper and lower respiratory specimens during the acute phase of infection. The lowest concentration of SARS-CoV-2 viral copies this assay can detect is 250 copies / mL. A negative result does not preclude SARS-CoV-2 infection and should not be used as the sole basis for treatment or other patient management decisions.  A negative result may occur with improper specimen collection / handling, submission of specimen other than nasopharyngeal swab, presence of viral mutation(s) within the areas targeted by this assay, and inadequate number of viral copies (<250 copies / mL). A negative result must be combined with clinical observations, patient history, and epidemiological information. Fact Sheet for  Patients:   StrictlyIdeas.no Fact Sheet for Healthcare Providers: BankingDealers.co.za This test is not yet approved or cleared  by the [place] FDA and has been authorized for detection and/or diagnosis of SARS-CoV-2 by FDA under an Emergency Use Authorization (EUA).  This EUA will remain in effect (meaning this test can be used) for the duration of the COVID-19 declaration under Section 564(b)(1) of the Act, 21 U.S.C. section 360bbb-3(b)(1), unless the authorization is terminated or revoked sooner. Performed at [facility], [place] [address], [place], [place] [address]   Aerobic/Anaerobic Culture (surgical/deep wound)     Status: None (Preliminary result)   Collection Time: 03/02/20  7:35 PM   Specimen: Soft Tissue, Other  Result Value Ref Range Status   Specimen Description KNEE  Final   Special Requests PRE PATELLA HEMATOMA LEFT  Final   Gram Stain   Final    RARE WBC PRESENT, PREDOMINANTLY PMN RARE GRAM POSITIVE COCCI    Culture   Final    NO GROWTH 3 DAYS Performed at [facility], [place] [address] [place], [place] [address]    Report Status PENDING  Incomplete  SARS Coronavirus 2 by RT PCR (hospital order, performed in [facility] hospital lab) Nasopharyngeal Nasopharyngeal Swab     Status: None   Collection Time: 03/06/20  1:56 PM   Specimen: Nasopharyngeal Swab  Result Value Ref Range Status   SARS Coronavirus 2 NEGATIVE NEGATIVE  Final    Comment: (NOTE) SARS-CoV-2 target nucleic acids are NOT DETECTED. The SARS-CoV-2 RNA is generally detectable in upper and lower respiratory specimens during the acute phase of infection. The lowest concentration of SARS-CoV-2 viral copies this assay can detect is 250 copies / mL. A negative result does not preclude SARS-CoV-2 infection and should not be used as the sole basis for treatment or other patient management decisions.  A negative result may occur with improper  specimen collection / handling, submission of specimen other than nasopharyngeal swab, presence of viral mutation(s) within the areas targeted by this assay, and inadequate number of viral copies (<250 copies / mL). A negative result must be combined with clinical observations, patient history, and epidemiological information. Fact Sheet for Patients:   BoilerBrush.com.cy Fact Sheet for Healthcare Providers: https://pope.com/ This test is not yet approved or cleared  by the [place] FDA and has been authorized for detection and/or diagnosis of SARS-CoV-2 by FDA under an Emergency Use Authorization (EUA).  This EUA will remain in effect (meaning this test can be used) for the duration of the COVID-19 declaration under Section 564(b)(1) of the Act, 21 U.S.C. section 360bbb-3(b)(1), unless the authorization is terminated or revoked sooner. Performed at [facility], [address], [place], [place] [address]     Procedures and diagnostic studies:  No results found.  Medications:    calcium carbonate  1 tablet Oral BID   chlorhexidine  60 mL Topical Once   Chlorhexidine Gluconate Cloth  6 each Topical Daily   diltiazem  120 mg Oral BID   docusate sodium  100 mg Oral BID   levothyroxine  75 mcg Oral QAC breakfast   polyethylene glycol  17 g Oral Daily   povidone-iodine  2 application Topical Once   pravastatin  10 mg Oral Daily   rivaroxaban  15 mg Oral Q supper   sodium phosphate  1 enema Rectal Once   Continuous Infusions:  methocarbamol (ROBAXIN) IV       LOS: 5 days   Joseph Art  [facility]   How to contact the [facility] Attending or Consulting provider 7A - 7P or covering provider during after hours 7P -7A, for this patient?  1. Check the care team in [facility] and look for a) attending/consulting TRH provider listed and b) the [facility] team listed 2. Log into [email or web address] and use [place]'s  universal password to access. If you do not have the password, please contact the hospital operator. 3. Locate the [facility] provider you are looking for under [facility] and page to a number that you can be directly reached. 4. If you still have difficulty reaching the provider, please page the [facility] (Director on Call) for the Hospitalists listed on amion for assistance.  03/07/2020, 8:15 AM

## 2020-03-08 LAB — CBC WITH DIFFERENTIAL/PLATELET
Abs Immature Granulocytes: 0.04 10*3/uL (ref 0.00–0.07)
Basophils Absolute: 0 10*3/uL (ref 0.0–0.1)
Basophils Relative: 0 %
Eosinophils Absolute: 0.1 10*3/uL (ref 0.0–0.5)
Eosinophils Relative: 1 %
HCT: 26.9 % — ABNORMAL LOW (ref 36.0–46.0)
Hemoglobin: 8.6 g/dL — ABNORMAL LOW (ref 12.0–15.0)
Immature Granulocytes: 1 %
Lymphocytes Relative: 25 %
Lymphs Abs: 1.6 10*3/uL (ref 0.7–4.0)
MCH: 33.1 pg (ref 26.0–34.0)
MCHC: 32 g/dL (ref 30.0–36.0)
MCV: 103.5 fL — ABNORMAL HIGH (ref 80.0–100.0)
Monocytes Absolute: 0.9 10*3/uL (ref 0.1–1.0)
Monocytes Relative: 14 %
Neutro Abs: 3.7 10*3/uL (ref 1.7–7.7)
Neutrophils Relative %: 59 %
Platelets: 222 10*3/uL (ref 150–400)
RBC: 2.6 MIL/uL — ABNORMAL LOW (ref 3.87–5.11)
RDW: 18.5 % — ABNORMAL HIGH (ref 11.5–15.5)
WBC: 6.2 10*3/uL (ref 4.0–10.5)
nRBC: 0 % (ref 0.0–0.2)

## 2020-03-08 LAB — AEROBIC/ANAEROBIC CULTURE W GRAM STAIN (SURGICAL/DEEP WOUND): Culture: NO GROWTH

## 2020-03-08 MED ORDER — B COMPLEX-C PO TABS: 1.0000 | ORAL_TABLET | Freq: Every day | ORAL | Status: DC

## 2020-03-08 MED ORDER — RISAQUAD PO CAPS: 1.0000 | ORAL_CAPSULE | Freq: Every day | ORAL | Status: DC

## 2020-03-08 MED ORDER — ZINC OXIDE 12.8 % EX OINT
TOPICAL_OINTMENT | CUTANEOUS | Status: DC | PRN
  Administered 2020-03-08: 1 via TOPICAL
  Filled 2020-03-08: qty 56.7

## 2020-03-08 MED ORDER — RIVAROXABAN 15 MG PO TABS: 15.0000 mg | ORAL_TABLET | Freq: Every day | ORAL | Status: DC

## 2020-03-08 MED ORDER — ZINC OXIDE 12.8 % EX OINT: TOPICAL_OINTMENT | CUTANEOUS | 0 refills | Status: DC | PRN

## 2020-03-08 MED ORDER — RISAQUAD PO CAPS
1.0000 | ORAL_CAPSULE | Freq: Every day | ORAL | Status: DC
  Administered 2020-03-08: 1 via ORAL
  Filled 2020-03-08: qty 1

## 2020-03-08 MED ORDER — ADULT MULTIVITAMIN W/MINERALS CH
1.0000 | ORAL_TABLET | Freq: Every day | ORAL | Status: DC
  Filled 2020-03-08: qty 1

## 2020-03-08 MED ORDER — B COMPLEX-C PO TABS
1.0000 | ORAL_TABLET | Freq: Every day | ORAL | Status: DC
  Administered 2020-03-08: 1 via ORAL
  Filled 2020-03-08: qty 1

## 2020-03-08 MED ORDER — ACETAMINOPHEN 325 MG PO TABS: 325.0000 mg | ORAL_TABLET | Freq: Four times a day (QID) | ORAL | Status: AC | PRN

## 2020-03-08 MED ORDER — METOPROLOL TARTRATE 25 MG PO TABS: 12.5000 mg | ORAL_TABLET | Freq: Two times a day (BID) | ORAL | Status: DC

## 2020-03-08 MED ORDER — DOCUSATE SODIUM 100 MG PO CAPS: 100.0000 mg | ORAL_CAPSULE | Freq: Two times a day (BID) | ORAL | 0 refills | Status: DC

## 2020-03-08 NOTE — Progress Notes (Signed)
Pt to discharge to [facility] for rehab, room 401B- report called to [place] at [employer], all questions answered, medications went over with her and an After visit summary was placed in folder for receiving facility. IV in left forearm removed, dressing and pressure applied. Pt has knee immobilizer on left leg & dressing on left knee, drainage marked. Daughters at bedside who are aware of discharge. [facility] to transport pt to facility.

## 2020-03-08 NOTE — TOC Transition Note (Signed)
Patient will DC to: [name] Anticipated DC date: 03/08/2020 Family notified: Delia, bedside Transport by: Sharin Mons   Per MD patient ready for DC to [name]. RN, patient, patient's family, and facility notified of DC. Discharge Summary and FL2 sent to facility. RN to call report prior to discharge [phone] Room 401B). DC packet on chart. Ambulance transport requested for patient.   CSW will sign off for now as social work intervention is no longer needed. Please consult [place] again if new needs arise. Transition of Care [facility]) - CM/SW Discharge Note   Patient Details  Name: Grace Alvarado MRN: [id number] Date of Birth: 02-Nov-1924  Transition of Care [facility]) CM/SW Contact:  Luiz Blare Phone Number: 03/08/2020, 10:35 AM   Clinical Narrative:       Final next level of care: Skilled Nursing Facility Barriers to Discharge: No Barriers Identified   Patient Goals and CMS Choice Patient states their goals for this hospitalization and ongoing recovery are:: To be independent CMS Medicare.gov Compare Post Acute Care list provided to:: Patient Choice offered to / list presented to : Patient  Discharge Placement              Patient chooses bed at: [facility] [place] Patient to be transferred to facility by: [facility] Name of family member notified: Delia Patient and family notified of of transfer: 03/08/20  Discharge Plan and Services                                     Social Determinants of Health (SDOH) Interventions     Readmission Risk Interventions Readmission Risk Prevention Plan 03/06/2020  Transportation Screening Complete  PCP or Specialist Appt within 5-7 Days Not Complete  Not Complete comments plan for SNF  Home Care Screening Complete  Medication Review (RN CM) Referral to Pharmacy  Some recent data might be hidden

## 2020-03-08 NOTE — Discharge Summary (Signed)
Physician Discharge Summary  Grace Alvarado XFG:[id number] DOB: 05/06/1925 DOA: 03/02/2020  PCP: Pincus Sanes, MD  Admit date: 03/02/2020 Discharge date: 03/08/2020  Admitted From: home Discharge disposition: SNF   Recommendations for Outpatient Follow-Up:   BMP/cbc 1 week Encourage PO intake (water plus pedilyte etc) Place barrier cream on bottom waterproof bandage on left knee such as Aquacel F/u with Dr. August Saucer in clinic ~1 week postop  Discharge Diagnosis:   Principal Problem:   Prepatellar bursitis, left knee Active Problems:   Chronic a-fib   Essential hypertension   Hypothyroid   Dyslipidemia   Hyponatremia   DNR (do not resuscitate)    Discharge Condition: Improved.  Diet recommendation: Regular.   Code status: DNR   History of Present Illness:   Grace Alvarado is a 86 y.o. female with medical history significant of hypothyroidism; HTN; HLD; and afib on Xarelto presenting with left knee infection.  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  She moved the puzzle off the table and was carrying it when she lost her balance and landed on both knees.  She called 911 and seemed to be ok.  The left knee started swelling and so they came to the ER on May 19 and discharged her.  She followed up with Dr. Denyse Amass a few days ago later, on 5/21.  She was given an antibiotic for traumatic prepatellar bursitis.  It was getting worse and so they were seen again yesterday; she was given Clindamycin and discharged.  She followed up again and it got worse again and so she was sent here.  No fever.   Hospital Course by Problem:   Prepatellar bursitis: Status post evacuation of left hematoma and wound VAC placement. Pain fairly controlled. Management deferred to orthopedics- followup 1 week  Paroxysmal atrial fibrillation: Continue Cardizem and Xarelto- add low dose BB if needed.  Acute hyponatremia: Sodium dropped to 125 lowest on 03/04/2020.   Per Dr. Jacqulyn Bath does not  seem to be SIADH.     Acute urinary retention/constipation: Patient could not pass urine and needed straight cath on the night of 03/02/2020 and then again she was retaining in the afternoon of 03/03/2020 so Foley catheter was inserted.This was removed  on 03/05/2020.  - add lactobacillus  Constipation -bowel regimen  Essential HTN:  -Controlled. Continue Cardizem  HLD -Continue Pravachol  Hypothyroidism -Continue Synthroid     Medical Consultants:   ortho   Discharge Exam:   Vitals:   03/07/20 2317 03/08/20 0455  BP: 119/78 115/67  Pulse: 93 94  Resp: 16 16  Temp: 98.7 F (37.1 C) 98 F (36.7 C)  SpO2: 97% 94%   Vitals:   03/07/20 1741 03/07/20 1900 03/07/20 2317 03/08/20 0455  BP:   119/78 115/67  Pulse: (!) 118 (!) 112 93 94  Resp: 16  16 16   Temp: 98.4 F (36.9 C)  98.7 F (37.1 C) 98 F (36.7 C)  TempSrc: Oral  Oral Oral  SpO2: 99%  97% 94%  Weight:      Height:        General exam: Appears calm and comfortable. But anxious   The results of significant diagnostics from this hospitalization (including imaging, microbiology, ancillary and laboratory) are listed below for reference.     Procedures and Diagnostic Studies:   No results found.   Labs:   Basic Metabolic Panel: Recent Labs  Lab 03/03/20 0810 03/03/20 1236 03/04/20 03/06/20 03/04/20 1408 03/05/20 03/07/20 03/05/20 03/07/20  03/05/20 0909 03/05/20 1438 03/05/20 1909 03/06/20 0547 03/07/20 0749  NA 129*   < > 125*   < > 127*   < > 129* 131* 128* 131* 131*  K 4.7  --  4.9  --  4.6   < >  --   --   --  4.0 4.0  CL 95*  --  95*  --  95*  --   --   --   --  99 95*  CO2 25  --  25  --  24  --   --   --   --  26 28  GLUCOSE 198*  --  147*  --  112*  --   --   --   --  93 139*  BUN 14  --  14  --  14  --   --   --   --  10 8  CREATININE 0.63  --  0.63  --  0.58  --   --   --   --  0.64 0.66  CALCIUM 9.4  --  8.8*  --  8.8*  --   --   --   --  8.7* 8.8*   < > = values in this  interval not displayed.   GFR Estimated Creatinine Clearance: 37.1 mL/min (by C-G formula based on SCr of 0.66 mg/dL). Liver Function Tests: Recent Labs  Lab 03/02/20 0853  AST 22  ALT 21  ALKPHOS 54  BILITOT 1.5*  PROT 6.5  ALBUMIN 3.3*   No results for input(s): LIPASE, AMYLASE in the last 168 hours. No results for input(s): AMMONIA in the last 168 hours. Coagulation profile No results for input(s): INR, PROTIME in the last 168 hours.  CBC: Recent Labs  Lab 03/04/20 0646 03/05/20 0213 03/06/20 0547 03/07/20 0749 03/08/20 0447  WBC 8.7 6.3 7.2 7.5 6.2  NEUTROABS 7.4 4.9 4.7 5.6 3.7  HGB 8.0* 7.8* 8.0* 8.6* 8.6*  HCT 24.5* 23.6* 25.0* 26.7* 26.9*  MCV 101.2* 100.4* 103.7* 103.5* 103.5*  PLT 174 183 193 228 222   Cardiac Enzymes: No results for input(s): CKTOTAL, CKMB, CKMBINDEX, TROPONINI in the last 168 hours. BNP: Invalid input(s): POCBNP CBG: No results for input(s): GLUCAP in the last 168 hours. D-Dimer No results for input(s): DDIMER in the last 72 hours. Hgb A1c No results for input(s): HGBA1C in the last 72 hours. Lipid Profile No results for input(s): CHOL, HDL, LDLCALC, TRIG, CHOLHDL, LDLDIRECT in the last 72 hours. Thyroid function studies No results for input(s): TSH, T4TOTAL, T3FREE, THYROIDAB in the last 72 hours.  Invalid input(s): FREET3 Anemia work up No results for input(s): VITAMINB12, FOLATE, FERRITIN, TIBC, IRON, RETICCTPCT in the last 72 hours. Microbiology Recent Results (from the past 240 hour(s))  Wound culture     Status: None   Collection Time: 03/01/20  4:50 PM   Specimen: Wound  Result Value Ref Range Status   MICRO NUMBER: [id number]  Final   SPECIMEN QUALITY: Adequate  Final   SOURCE: WOUND (SITE NOT SPECIFIED)  Final   STATUS: FINAL  Final   GRAM STAIN:   Final    No white blood cells seen No epithelial cells seen No organisms seen   RESULT: No Growth  Final  SARS Coronavirus 2 by RT PCR (hospital order, performed in [facility] hospital lab) Nasopharyngeal Nasopharyngeal Swab     Status: None   Collection Time: 03/02/20 10:55 AM   Specimen: Nasopharyngeal Swab  Result  Value Ref Range Status   SARS Coronavirus 2 NEGATIVE NEGATIVE Final    Comment: (NOTE) SARS-CoV-2 target nucleic acids are NOT DETECTED. The SARS-CoV-2 RNA is generally detectable in upper and lower respiratory specimens during the acute phase of infection. The lowest concentration of SARS-CoV-2 viral copies this assay can detect is 250 copies / mL. A negative result does not preclude SARS-CoV-2 infection and should not be used as the sole basis for treatment or other patient management decisions.  A negative result may occur with improper specimen collection / handling, submission of specimen other than nasopharyngeal swab, presence of viral mutation(s) within the areas targeted by this assay, and inadequate number of viral copies (<250 copies / mL). A negative result must be combined with clinical observations, patient history, and epidemiological information. Fact Sheet for Patients:   StrictlyIdeas.no Fact Sheet for Healthcare Providers: BankingDealers.co.za This test is not yet approved or cleared  by the [place] FDA and has been authorized for detection and/or diagnosis of SARS-CoV-2 by FDA under an Emergency Use Authorization (EUA).  This EUA will remain in effect (meaning this test can be used) for the duration of the COVID-19 declaration under Section 564(b)(1) of the Act, 21 U.S.C. section 360bbb-3(b)(1), unless the authorization is terminated or revoked sooner. Performed at [facility], [place] [address] [place], [place] [address]   Aerobic/Anaerobic Culture (surgical/deep wound)     Status: None (Preliminary result)   Collection Time: 03/02/20  7:35 PM   Specimen: Soft Tissue, Other  Result Value Ref Range Status   Specimen Description KNEE  Final   Special  Requests PRE PATELLA HEMATOMA LEFT  Final   Gram Stain   Final    RARE WBC PRESENT, PREDOMINANTLY PMN RARE GRAM POSITIVE COCCI    Culture   Final    NO GROWTH 4 DAYS Performed at [facility], [place] [address] [place], [place] [address]    Report Status PENDING  Incomplete  SARS Coronavirus 2 by RT PCR (hospital order, performed in [facility] hospital lab) Nasopharyngeal Nasopharyngeal Swab     Status: None   Collection Time: 03/06/20  1:56 PM   Specimen: Nasopharyngeal Swab  Result Value Ref Range Status   SARS Coronavirus 2 NEGATIVE NEGATIVE Final    Comment: (NOTE) SARS-CoV-2 target nucleic acids are NOT DETECTED. The SARS-CoV-2 RNA is generally detectable in upper and lower respiratory specimens during the acute phase of infection. The lowest concentration of SARS-CoV-2 viral copies this assay can detect is 250 copies / mL. A negative result does not preclude SARS-CoV-2 infection and should not be used as the sole basis for treatment or other patient management decisions.  A negative result may occur with improper specimen collection / handling, submission of specimen other than nasopharyngeal swab, presence of viral mutation(s) within the areas targeted by this assay, and inadequate number of viral copies (<250 copies / mL). A negative result must be combined with clinical observations, patient history, and epidemiological information. Fact Sheet for Patients:   StrictlyIdeas.no Fact Sheet for Healthcare Providers: BankingDealers.co.za This test is not yet approved or cleared  by the [place] FDA and has been authorized for detection and/or diagnosis of SARS-CoV-2 by FDA under an Emergency Use Authorization (EUA).  This EUA will remain in effect (meaning this test can be used) for the duration of the COVID-19 declaration under Section 564(b)(1) of the Act, 21 U.S.C. section 360bbb-3(b)(1), unless the authorization is  terminated or revoked sooner. Performed at [facility]  [facility], [address], [place], [place] [address]      Discharge Instructions:   Discharge Instructions    Diet general   Complete by: As directed    Increase activity slowly   Complete by: As directed      Allergies as of 03/08/2020      Reactions   Codeine Other (See Comments)   unknown   Penicillins Other (See Comments)   unknown   Vancomycin Other (See Comments)   unknown   Zithromax [azithromycin] Other (See Comments)   unknown   Latex Rash      Medication List    STOP taking these medications   clindamycin 300 MG capsule Commonly known as: Cleocin   furosemide 20 MG tablet Commonly known as: LASIX   potassium chloride 10 MEQ tablet Commonly known as: KLOR-CON   VITAMIN E/FOLIC ACID/B-6/B-12 PO     TAKE these medications   acetaminophen 325 MG tablet Commonly known as: TYLENOL Take 1-2 tablets (325-650 mg total) by mouth every 6 (six) hours as needed for mild pain (pain score 1-3 or temp > 100.5).   acidophilus Caps capsule Take 1 capsule by mouth daily.   B-complex with vitamin C tablet Take 1 tablet by mouth daily.   diltiazem 120 MG 12 hr capsule Commonly known as: CARDIZEM SR Take 1 capsule (120 mg total) by mouth 2 (two) times daily.   docusate sodium 100 MG capsule Commonly known as: COLACE Take 1 capsule (100 mg total) by mouth 2 (two) times daily.   levothyroxine 75 MCG tablet Commonly known as: SYNTHROID TAKE 1 TABLET (75 MCG TOTAL) BY MOUTH DAILY BEFORE BREAKFAST.   metoprolol tartrate 25 MG tablet Commonly known as: LOPRESSOR Take 0.5 tablets (12.5 mg total) by mouth 2 (two) times daily.   pravastatin 10 MG tablet Commonly known as: PRAVACHOL Take 1 tablet (10 mg total) by mouth daily.   Rivaroxaban 15 MG Tabs tablet Commonly known as: XARELTO Take 1 tablet (15 mg total) by mouth daily with supper. What changed:   medication strength  See the new instructions.     Vitamin D (Ergocalciferol) 1.25 MG (50000 UNIT) Caps capsule Commonly known as: DRISDOL Take 1 capsule (50,000 Units total) by mouth every 7 (seven) days. What changed: additional instructions   Zinc Oxide 12.8 % ointment Commonly known as: TRIPLE PASTE Apply topically as needed for irritation.      Contact information for after-discharge care    Destination    [facility] Preferred SNF .   Service: Skilled Nursing Contact information: [address] [place] [address] [phone]               Time coordinating discharge: 55 min  Signed:  Joseph Art DO  [facility] 03/08/2020, 9:34 AM

## 2020-03-08 NOTE — Progress Notes (Signed)
Occupational Therapy Treatment Patient Details Name: Grace Alvarado MRN: [id number] DOB: 1924-12-31 Today's Date: 03/08/2020    History of present illness Pt is a 86 y/o female admitted secondary to a L knee hematoma after sustaining a fall at home several day prior. Pt is now s/p hematoma evacuation with wound VAC placement. PMH including but not limited to a-fib, HTN and HLD.   OT comments  Patient initially reluctant to participate and needing encouragement.  Therapist emphasized importance of movement and practicing to increase independence.  Able to complete bed mobility with min assist and stand pivot to [facility] with mod assist.  Patient very anxious about falling though pleased with herself afterwards, stating she did better than she thought she would.  Able to toilet with supervision while seated.  Patient will benefit from rehab to increase strength and mobility for increased independence with ADLs.  Follow Up Recommendations  SNF    Equipment Recommendations  Other (comment)(defer to next venue)    Recommendations for Other Services      Precautions / Restrictions Precautions Precautions: Fall Precaution Comments: L knee immobilizer on at all times Required Braces or Orthoses: Knee Immobilizer - Left Knee Immobilizer - Left: On at all times;On when out of bed or walking;Other (comment)(not allowed knee flexion) Restrictions Weight Bearing Restrictions: Yes LLE Weight Bearing: Weight bearing as tolerated       Mobility Bed Mobility Overal bed mobility: Needs Assistance Bed Mobility: Supine to Sit;Sit to Supine     Supine to sit: Min assist Sit to supine: Min assist      Transfers Overall transfer level: Needs assistance Equipment used: 2 person hand held assist Transfers: Sit to/from [facility] Sit to Stand: Mod assist Stand pivot transfers: Mod assist            Balance Overall balance assessment: Needs assistance Sitting-balance support:  Feet supported Sitting balance-Leahy Scale: Fair     Standing balance support: During functional activity;Bilateral upper extremity supported Standing balance-Leahy Scale: Poor                             ADL either performed or assessed with clinical judgement   ADL Overall ADL's : Needs assistance/impaired     Grooming: Wash/dry hands;Set up;Sitting                   Toilet Transfer: Moderate assistance;Stand-pivot;BSC   Toileting- Clothing Manipulation and Hygiene: Supervision/safety;Sitting/lateral lean       Functional mobility during ADLs: Moderate assistance       Vision       Perception     Praxis      Cognition Arousal/Alertness: Awake/alert Behavior During Therapy: WFL for tasks assessed/performed;Anxious Overall Cognitive Status: Impaired/Different from baseline Area of Impairment: Memory;Safety/judgement                     Memory: Decreased short-term memory   Safety/Judgement: Decreased awareness of safety     General Comments: Patient very anxious during session, seemed like she was not sure what was going on sometimes. Needing many reminders for sequencing and steps to what we are doing next        Exercises     Shoulder Instructions       General Comments Vitals WNL    Pertinent Vitals/ Pain       Pain Assessment: Faces Faces Pain Scale: Hurts little more Pain Location: L knee Pain Descriptors /  Indicators: Sore;Guarding Pain Intervention(s): Monitored during session;Repositioned;Limited activity within patient's tolerance  Home Living                                          Prior Functioning/Environment              Frequency  Min 2X/week        Progress Toward Goals  OT Goals(current goals can now be found in the care plan section)  Progress towards OT goals: Progressing toward goals  Acute Rehab OT Goals Patient Stated Goal: Return home and back to independence OT  Goal Formulation: With patient/family Time For Goal Achievement: 03/19/20 Potential to Achieve Goals: Good  Plan Discharge plan remains appropriate    Co-evaluation                 AM-PAC OT "6 Clicks" Daily Activity     Outcome Measure   Help from another person eating meals?: None Help from another person taking care of personal grooming?: A Little Help from another person toileting, which includes using toliet, bedpan, or urinal?: A Lot Help from another person bathing (including washing, rinsing, drying)?: A Lot Help from another person to put on and taking off regular upper body clothing?: A Little Help from another person to put on and taking off regular lower body clothing?: A Lot 6 Click Score: 16    End of Session Equipment Utilized During Treatment: Gait belt;Left knee immobilizer  OT Visit Diagnosis: Unsteadiness on feet (R26.81);Other abnormalities of gait and mobility (R26.89);Muscle weakness (generalized) (M62.81);Pain Pain - Right/Left: Left Pain - part of body: Knee   Activity Tolerance Patient tolerated treatment well   Patient Left in bed;with call bell/phone within reach;with bed alarm set;with family/visitor present   Nurse Communication Mobility status        Time: 9233-0076 OT Time Calculation (min): 28 min  Charges: OT General Charges $OT Visit: 1 Visit OT Treatments $Self Care/Home Management : 23-37 mins  Barbie Banner, OTR/L    Adella Hare 03/08/2020, 3:46 PM

## 2020-03-11 ENCOUNTER — Other Ambulatory Visit: Payer: Self-pay | Admitting: Internal Medicine

## 2020-03-12 ENCOUNTER — Telehealth: Payer: Self-pay | Admitting: Orthopedic Surgery

## 2020-03-12 NOTE — Telephone Encounter (Signed)
I s/w daughter and advised of below. She verbalized understanding. She would now like to know if the appointment she has for Friday afternoon is sufficient or should she be worked in sooner? She would would like for me to discuss with Dr August Saucer and call her back.

## 2020-03-12 NOTE — Telephone Encounter (Signed)
Patient is in SNF.  Daughter has questions regarding this. She said that there were no instructions given at discharge for patient. I advised that patient should be seen this week. Need clarification for physical therapist Need to know if immobilizer should stay in place Need to know if patient needs wound care orders

## 2020-03-12 NOTE — Telephone Encounter (Signed)
Talked with Dr August Saucer. Order faxed to [facility] in knee immobilizer. Dry Dressing to wound if aquacel dressing not in place. Follow up with Dr August Saucer this week and potentially begin ROM at that time after evaluated.  Will call daughter back and advised as previously discussed with her.

## 2020-03-12 NOTE — Telephone Encounter (Signed)
F/u Wednesday, which would be one week following her hospital discharge

## 2020-03-12 NOTE — Telephone Encounter (Signed)
Pls advise. Thanks.  

## 2020-03-12 NOTE — Telephone Encounter (Signed)
Patient's daughter.   She is requesting a call back to establish her mother's follow up instructions.   Call back: [phone]

## 2020-03-13 NOTE — Telephone Encounter (Signed)
I called and Grace Alvarado was still not available. I was able to speak with Grace Alvarado.  They stated that they were unable to bring her Wednesday for appointment work in. They were able to bring her Thursday morning and she is scheduled for 10:15

## 2020-03-13 NOTE — Telephone Encounter (Signed)
IC [facility] and asked for Tresa Endo to see if they had transportation available for patient to be brought in for appointment either Wednesday or Thursday morning. Tresa Endo was not available until after 930 this morning. I will try back then.  [phone]

## 2020-03-15 ENCOUNTER — Ambulatory Visit (INDEPENDENT_AMBULATORY_CARE_PROVIDER_SITE_OTHER): Payer: Medicare Other | Admitting: Orthopedic Surgery

## 2020-03-15 ENCOUNTER — Encounter: Payer: Self-pay | Admitting: Orthopedic Surgery

## 2020-03-15 DIAGNOSIS — S8002XD Contusion of left knee, subsequent encounter: Secondary | ICD-10-CM

## 2020-03-15 NOTE — Progress Notes (Signed)
Post-Op Visit Note   Patient: Grace Alvarado           Date of Birth: Nov 03, 1924           MRN: [id number] Visit Date: 03/15/2020 PCP: Binnie Rail, MD   Assessment & Plan:  Chief Complaint:  Chief Complaint  Patient presents with  . Left Knee - Routine Post Op   Visit Diagnoses:  1. Traumatic hematoma of left knee, subsequent encounter     Plan: Grace Alvarado is a patient underwent left knee evacuation of hematoma 02/23/2020.  She has been in a skilled nursing facility in a knee immobilizer.  Incisional wound VAC removed several days ago.  She not have any fevers or chills.  On exam the incision looks like it is in the process of healing.  No drainage.  No recurrence of hematoma.  She does have some expected amount of swelling in that left leg but negative Homans and no distinct calf tenderness.  She is able to do about 10-15 straight leg raises.  Plan at this time is to let her walk weightbearing as tolerated with a walker and without the knee immobilizer.  Okay to start doing some knee range of motion exercises.  She does have about a quarter in a nickel sized area on either side of the incision which may or may not have full-thickness skin loss from this hematoma.  We will have to observe that for now.  No evidence of infection.  We will air this out a little bit with a less occlusive dressing.  Follow-up in 1 week for suture removal and repeat evaluation of this skin region which has questionable viability.  Follow-Up Instructions: Return in about 1 week (around 03/22/2020).   Orders:  No orders of the defined types were placed in this encounter.  No orders of the defined types were placed in this encounter.   Imaging: No results found.  PMFS History: Patient Active Problem List   Diagnosis Date Noted  . Hyponatremia 03/07/2020  . DNR (do not resuscitate) 03/07/2020  . Prepatellar bursitis, left knee 03/02/2020  . Neuropathy 01/24/2020  . Chronic back pain 01/24/2020  .  Complaints of leg weakness 01/24/2020  . Compression fracture of T11 vertebra ([place]) 01/23/2020  . Leg pain 01/03/2020  . Chronic anticoagulation 01/03/2020  . Compression fracture of L1 lumbar vertebra ([place]) 07/27/2019  . Low back pain 07/20/2019  . Hyperglycemia 05/01/2019  . Neck pain, musculoskeletal 11/16/2018  . Constipation 11/16/2018  . Hypokalemia   . AVM (arteriovenous malformation) of colon   . Lower GI bleed 11/06/2018  . Iron deficiency anemia 11/02/2018  . Lymphedema of left lower extremity 10/22/2018  . B12 deficiency 05/27/2018  . Dependent edema 05/25/2018  . Pulmonary hypertension, primary ([place]) 05/20/2018  . Dyslipidemia 05/20/2018  . CHF (congestive heart failure) ([place]) 05/20/2018  . Tricuspid regurgitation 05/17/2018  . Mitral regurgitation 05/17/2018  . Chronic a-fib 05/17/2018  . Essential hypertension 05/17/2018  . Hypothyroid 05/17/2018  . CAD (coronary artery disease) 05/17/2018  . Carotid atherosclerosis, bilateral 05/17/2018   Past Medical History:  Diagnosis Date  . Atrial fibrillation ([place])   . Hyperlipidemia   . Hypertension   . Hypothyroidism   . Mitral regurgitation   . MVP (mitral valve prolapse)     Family History  Problem Relation Age of Onset  . Cancer Mother   . Dementia Mother     Past Surgical History:  Procedure Laterality Date  . APPENDECTOMY    .  c section    . CESAREAN SECTION    . CHOLECYSTECTOMY    . COLONOSCOPY WITH PROPOFOL N/A 11/09/2018   Procedure: COLONOSCOPY WITH PROPOFOL;  Surgeon: Sherrilyn Rist, MD;  Location: [facility] ENDOSCOPY;  Service: Gastroenterology;  Laterality: N/A;  . ESOPHAGOGASTRODUODENOSCOPY (EGD) WITH PROPOFOL N/A 11/09/2018   Procedure: ESOPHAGOGASTRODUODENOSCOPY (EGD) WITH PROPOFOL;  Surgeon: Sherrilyn Rist, MD;  Location: [facility] ENDOSCOPY;  Service: Gastroenterology;  Laterality: N/A;  . HOT HEMOSTASIS N/A 11/09/2018   Procedure: HOT HEMOSTASIS (ARGON PLASMA COAGULATION/BICAP);  Surgeon: Sherrilyn Rist, MD;  Location: [facility] ENDOSCOPY;  Service: Gastroenterology;  Laterality: N/A;  . I & D EXTREMITY Left 03/02/2020   Procedure: evacuation of left knee hematoma, possible wound vac placement;  Surgeon: Cammy Copa, MD;  Location: [facility] OR;  Service: Orthopedics;  Laterality: Left;  . SUBMUCOSAL INJECTION  11/09/2018   Procedure: SUBMUCOSAL INJECTION;  Surgeon: Sherrilyn Rist, MD;  Location: [facility] ENDOSCOPY;  Service: Gastroenterology;;   Social History   Occupational History  . Occupation: retired  Tobacco Use  . Smoking status: Former Games developer  . Smokeless tobacco: Never Used  Vaping Use  . Vaping Use: Never used  Substance and Sexual Activity  . Alcohol use: Not Currently  . Drug use: Never  . Sexual activity: Not Currently

## 2020-03-16 ENCOUNTER — Inpatient Hospital Stay: Payer: Medicare Other | Admitting: Surgical

## 2020-03-16 ENCOUNTER — Other Ambulatory Visit: Payer: Self-pay | Admitting: Internal Medicine

## 2020-03-21 ENCOUNTER — Ambulatory Visit (INDEPENDENT_AMBULATORY_CARE_PROVIDER_SITE_OTHER): Payer: Medicare Other | Admitting: Orthopedic Surgery

## 2020-03-21 ENCOUNTER — Encounter: Payer: Self-pay | Admitting: Orthopedic Surgery

## 2020-03-21 DIAGNOSIS — S8002XD Contusion of left knee, subsequent encounter: Secondary | ICD-10-CM

## 2020-03-21 NOTE — Progress Notes (Signed)
Post-Op Visit Note   Patient: Grace Alvarado           Date of Birth: 1925/06/09           MRN: [id number] Visit Date: 03/21/2020 PCP: Binnie Rail, MD   Assessment & Plan:  Chief Complaint:  Chief Complaint  Patient presents with  . Left Knee - Follow-up   Visit Diagnoses:  1. Traumatic hematoma of left knee, subsequent encounter     Plan: Rosemae is now about 3 weeks out left knee prepatellar bursa hematoma evacuation.  Half of the sutures were removed today.  She is really having some trouble at the inferior portion of the incision with healing.  She also has had a Foley placed in and out several times.  She has been placed on Levaquin for urinary tract infection.  Has appointment with urology on July 1.  She is not had any real septic symptoms.  [facility].  She has been doing the range of motion exercises with no flexion past 90 as well as ambulation.  She is on Xarelto as well for cardiac issues.  They are considering discharging the patient from the skilled nursing facility.  Today she cannot get herself up unassisted from the wheelchair.  On exam there is some area of partial-thickness skin loss on the lower medial portion of the incision measuring about the size of a quarter.  Mild serous drainage is also present.  Half the sutures are removed today.  We will see her back in a week essentially for clinical recheck.  The bursal space has been closed but still may have residual hematoma.  None of that appears infected at this time and the patient is on Levaquin.  No calf tenderness and negative Homans today.  Follow-Up Instructions: Return in about 1 week (around 03/28/2020).   Orders:  No orders of the defined types were placed in this encounter.  No orders of the defined types were placed in this encounter.   Imaging: No results found.  PMFS History: Patient Active Problem List   Diagnosis Date Noted  . Hyponatremia 03/07/2020  . DNR (do not resuscitate)  03/07/2020  . Prepatellar bursitis, left knee 03/02/2020  . Neuropathy 01/24/2020  . Chronic back pain 01/24/2020  . Complaints of leg weakness 01/24/2020  . Compression fracture of T11 vertebra ([place]) 01/23/2020  . Leg pain 01/03/2020  . Chronic anticoagulation 01/03/2020  . Compression fracture of L1 lumbar vertebra ([place]) 07/27/2019  . Low back pain 07/20/2019  . Hyperglycemia 05/01/2019  . Neck pain, musculoskeletal 11/16/2018  . Constipation 11/16/2018  . Hypokalemia   . AVM (arteriovenous malformation) of colon   . Lower GI bleed 11/06/2018  . Iron deficiency anemia 11/02/2018  . Lymphedema of left lower extremity 10/22/2018  . B12 deficiency 05/27/2018  . Dependent edema 05/25/2018  . Pulmonary hypertension, primary ([place]) 05/20/2018  . Dyslipidemia 05/20/2018  . CHF (congestive heart failure) ([place]) 05/20/2018  . Tricuspid regurgitation 05/17/2018  . Mitral regurgitation 05/17/2018  . Chronic a-fib 05/17/2018  . Essential hypertension 05/17/2018  . Hypothyroid 05/17/2018  . CAD (coronary artery disease) 05/17/2018  . Carotid atherosclerosis, bilateral 05/17/2018   Past Medical History:  Diagnosis Date  . Atrial fibrillation ([place])   . Hyperlipidemia   . Hypertension   . Hypothyroidism   . Mitral regurgitation   . MVP (mitral valve prolapse)     Family History  Problem Relation Age of Onset  . Cancer Mother   .  Dementia Mother     Past Surgical History:  Procedure Laterality Date  . APPENDECTOMY    . c section    . CESAREAN SECTION    . CHOLECYSTECTOMY    . COLONOSCOPY WITH PROPOFOL N/A 11/09/2018   Procedure: COLONOSCOPY WITH PROPOFOL;  Surgeon: Sherrilyn Rist, MD;  Location: [facility] ENDOSCOPY;  Service: Gastroenterology;  Laterality: N/A;  . ESOPHAGOGASTRODUODENOSCOPY (EGD) WITH PROPOFOL N/A 11/09/2018   Procedure: ESOPHAGOGASTRODUODENOSCOPY (EGD) WITH PROPOFOL;  Surgeon: Sherrilyn Rist, MD;  Location: [facility] ENDOSCOPY;  Service: Gastroenterology;  Laterality:  N/A;  . HOT HEMOSTASIS N/A 11/09/2018   Procedure: HOT HEMOSTASIS (ARGON PLASMA COAGULATION/BICAP);  Surgeon: Sherrilyn Rist, MD;  Location: [facility] ENDOSCOPY;  Service: Gastroenterology;  Laterality: N/A;  . I & D EXTREMITY Left 03/02/2020   Procedure: evacuation of left knee hematoma, possible wound vac placement;  Surgeon: Cammy Copa, MD;  Location: [facility] OR;  Service: Orthopedics;  Laterality: Left;  . SUBMUCOSAL INJECTION  11/09/2018   Procedure: SUBMUCOSAL INJECTION;  Surgeon: Sherrilyn Rist, MD;  Location: [facility] ENDOSCOPY;  Service: Gastroenterology;;   Social History   Occupational History  . Occupation: retired  Tobacco Use  . Smoking status: Former Games developer  . Smokeless tobacco: Never Used  Vaping Use  . Vaping Use: Never used  Substance and Sexual Activity  . Alcohol use: Not Currently  . Drug use: Never  . Sexual activity: Not Currently

## 2020-03-24 ENCOUNTER — Encounter: Payer: Self-pay | Admitting: Internal Medicine

## 2020-03-27 ENCOUNTER — Encounter: Payer: Self-pay | Admitting: Internal Medicine

## 2020-03-27 NOTE — Telephone Encounter (Signed)
In reviewing medication list emailed by Jarvis Newcomer" (daughter) pt is taking furosemide and K+ this is not on current medication list. Will call and review with daughter Called Jarvis Newcomer" (daughter) she states that pt weight "shot up to over 150" and this was added by our office. I do not see this in her chart. Will send for PA/MD review.

## 2020-03-28 ENCOUNTER — Encounter: Payer: Self-pay | Admitting: Family Medicine

## 2020-03-28 ENCOUNTER — Ambulatory Visit (INDEPENDENT_AMBULATORY_CARE_PROVIDER_SITE_OTHER): Payer: Medicare Other | Admitting: Orthopedic Surgery

## 2020-03-28 ENCOUNTER — Encounter: Payer: Self-pay | Admitting: Orthopedic Surgery

## 2020-03-28 DIAGNOSIS — S8002XD Contusion of left knee, subsequent encounter: Secondary | ICD-10-CM

## 2020-03-28 NOTE — Progress Notes (Signed)
Post-Op Visit Note   Patient: Grace Alvarado           Date of Birth: 17-Apr-1925           MRN: [id number] Visit Date: 03/28/2020 PCP: Pincus Sanes, MD   Assessment & Plan:  Chief Complaint:  Chief Complaint  Patient presents with  . Left Knee - Follow-up   Visit Diagnoses:  1. Traumatic hematoma of left knee, subsequent encounter     Plan: [name] is a patient is now about 4 weeks out left knee hematoma evacuation.  She is doing better.  On exam the area of eschar is about the size of a half dollar.  No fluctuance.  Looks like her body is healing this from the inside out.  Last sutures were removed today.  Continue with straight leg raises knee range of motion and weightbearing as tolerated.  I do not know she is quite yet ready to return to independent living.  Come back in 2 weeks for another clinical recheck.  She is having a little bit of calf tenderness today.  She has been on Xarelto but has not been too active.  Although unlikely I think it is reasonable to make sure she does not have DVT.  Ordering left leg ultrasound today to rule out DVT.  If that is positive we may need to increase her Xarelto dosage.  Follow-Up Instructions: Return in about 2 weeks (around 04/11/2020).   Orders:  No orders of the defined types were placed in this encounter.  No orders of the defined types were placed in this encounter.   Imaging: No results found.  PMFS History: Patient Active Problem List   Diagnosis Date Noted  . Hyponatremia 03/07/2020  . DNR (do not resuscitate) 03/07/2020  . Prepatellar bursitis, left knee 03/02/2020  . Neuropathy 01/24/2020  . Chronic back pain 01/24/2020  . Complaints of leg weakness 01/24/2020  . Compression fracture of T11 vertebra ([facility]) 01/23/2020  . Leg pain 01/03/2020  . Chronic anticoagulation 01/03/2020  . Compression fracture of L1 lumbar vertebra ([facility]) 07/27/2019  . Low back pain 07/20/2019  . Hyperglycemia 05/01/2019  . Neck pain,  musculoskeletal 11/16/2018  . Constipation 11/16/2018  . Hypokalemia   . AVM (arteriovenous malformation) of colon   . Lower GI bleed 11/06/2018  . Iron deficiency anemia 11/02/2018  . Lymphedema of left lower extremity 10/22/2018  . B12 deficiency 05/27/2018  . Dependent edema 05/25/2018  . Pulmonary hypertension, primary ([facility]) 05/20/2018  . Dyslipidemia 05/20/2018  . CHF (congestive heart failure) ([facility]) 05/20/2018  . Tricuspid regurgitation 05/17/2018  . Mitral regurgitation 05/17/2018  . Chronic a-fib 05/17/2018  . Essential hypertension 05/17/2018  . Hypothyroid 05/17/2018  . CAD (coronary artery disease) 05/17/2018  . Carotid atherosclerosis, bilateral 05/17/2018   Past Medical History:  Diagnosis Date  . Atrial fibrillation ([facility])   . Hyperlipidemia   . Hypertension   . Hypothyroidism   . Mitral regurgitation   . MVP (mitral valve prolapse)     Family History  Problem Relation Age of Onset  . Cancer Mother   . Dementia Mother     Past Surgical History:  Procedure Laterality Date  . APPENDECTOMY    . c section    . CESAREAN SECTION    . CHOLECYSTECTOMY    . COLONOSCOPY WITH PROPOFOL N/A 11/09/2018   Procedure: COLONOSCOPY WITH PROPOFOL;  Surgeon: Sherrilyn Rist, MD;  Location: [facility] ENDOSCOPY;  Service: Gastroenterology;  Laterality: N/A;  . ESOPHAGOGASTRODUODENOSCOPY (  EGD) WITH PROPOFOL N/A 11/09/2018   Procedure: ESOPHAGOGASTRODUODENOSCOPY (EGD) WITH PROPOFOL;  Surgeon: Doran Stabler, MD;  Location: [place];  Service: Gastroenterology;  Laterality: N/A;  . HOT HEMOSTASIS N/A 11/09/2018   Procedure: HOT HEMOSTASIS (ARGON PLASMA COAGULATION/BICAP);  Surgeon: Doran Stabler, MD;  Location: [place];  Service: Gastroenterology;  Laterality: N/A;  . I & D EXTREMITY Left 03/02/2020   Procedure: evacuation of left knee hematoma, possible wound vac placement;  Surgeon: Meredith Pel, MD;  Location: [place];  Service: Orthopedics;  Laterality: Left;  .  SUBMUCOSAL INJECTION  11/09/2018   Procedure: SUBMUCOSAL INJECTION;  Surgeon: Doran Stabler, MD;  Location: [place];  Service: Gastroenterology;;   Social History   Occupational History  . Occupation: retired  Tobacco Use  . Smoking status: Former Research scientist (life sciences)  . Smokeless tobacco: Never Used  Vaping Use  . Vaping Use: Never used  Substance and Sexual Activity  . Alcohol use: Not Currently  . Drug use: Never  . Sexual activity: Not Currently

## 2020-03-28 NOTE — Addendum Note (Signed)
Addended byPrescott Parma on: 03/28/2020 10:46 AM   Modules accepted: Orders

## 2020-03-29 ENCOUNTER — Encounter (HOSPITAL_COMMUNITY): Payer: Medicare Other

## 2020-04-04 DIAGNOSIS — Z79899 Other long term (current) drug therapy: Secondary | ICD-10-CM

## 2020-04-06 ENCOUNTER — Telehealth: Payer: Self-pay

## 2020-04-06 NOTE — Telephone Encounter (Signed)
New message   [facility] needs verbal orders   1 X 5 week and one prn - for nursing   Stage 1 pressure sore to her scrotum  Having constipation issues can something be called into the facility - [facility]

## 2020-04-06 NOTE — Telephone Encounter (Signed)
Ok - order sheet complete  - on your desk

## 2020-04-07 ENCOUNTER — Emergency Department (HOSPITAL_COMMUNITY)
Admission: EM | Admit: 2020-04-07 | Discharge: 2020-04-07 | Disposition: A | Discharge status: Home / Self Care | Payer: Medicare Other | Attending: Emergency Medicine | Admitting: Emergency Medicine

## 2020-04-07 ENCOUNTER — Other Ambulatory Visit: Payer: Self-pay

## 2020-04-07 DIAGNOSIS — Z79899 Other long term (current) drug therapy: Secondary | ICD-10-CM | POA: Diagnosis not present

## 2020-04-07 DIAGNOSIS — I4891 Unspecified atrial fibrillation: Secondary | ICD-10-CM | POA: Insufficient documentation

## 2020-04-07 DIAGNOSIS — I10 Essential (primary) hypertension: Secondary | ICD-10-CM | POA: Insufficient documentation

## 2020-04-07 DIAGNOSIS — Z7901 Long term (current) use of anticoagulants: Secondary | ICD-10-CM | POA: Diagnosis not present

## 2020-04-07 DIAGNOSIS — E039 Hypothyroidism, unspecified: Secondary | ICD-10-CM | POA: Insufficient documentation

## 2020-04-07 DIAGNOSIS — K5909 Other constipation: Secondary | ICD-10-CM | POA: Diagnosis not present

## 2020-04-07 DIAGNOSIS — R339 Retention of urine, unspecified: Secondary | ICD-10-CM

## 2020-04-07 LAB — CBC WITH DIFFERENTIAL/PLATELET
Abs Immature Granulocytes: 0.01 10*3/uL (ref 0.00–0.07)
Basophils Absolute: 0 10*3/uL (ref 0.0–0.1)
Basophils Relative: 0 %
Eosinophils Absolute: 0.1 10*3/uL (ref 0.0–0.5)
Eosinophils Relative: 2 %
HCT: 36.7 % (ref 36.0–46.0)
Hemoglobin: 11.6 g/dL — ABNORMAL LOW (ref 12.0–15.0)
Immature Granulocytes: 0 %
Lymphocytes Relative: 33 %
Lymphs Abs: 1.5 10*3/uL (ref 0.7–4.0)
MCH: 33 pg (ref 26.0–34.0)
MCHC: 31.6 g/dL (ref 30.0–36.0)
MCV: 104.3 fL — ABNORMAL HIGH (ref 80.0–100.0)
Monocytes Absolute: 0.5 10*3/uL (ref 0.1–1.0)
Monocytes Relative: 11 %
Neutro Abs: 2.5 10*3/uL (ref 1.7–7.7)
Neutrophils Relative %: 54 %
Platelets: 148 10*3/uL — ABNORMAL LOW (ref 150–400)
RBC: 3.52 MIL/uL — ABNORMAL LOW (ref 3.87–5.11)
RDW: 15.8 % — ABNORMAL HIGH (ref 11.5–15.5)
WBC: 4.6 10*3/uL (ref 4.0–10.5)
nRBC: 0 % (ref 0.0–0.2)

## 2020-04-07 LAB — COMPREHENSIVE METABOLIC PANEL
ALT: 25 U/L (ref 0–44)
AST: 36 U/L (ref 15–41)
Albumin: 3.4 g/dL — ABNORMAL LOW (ref 3.5–5.0)
Alkaline Phosphatase: 52 U/L (ref 38–126)
Anion gap: 11 (ref 5–15)
BUN: 8 mg/dL (ref 8–23)
CO2: 24 mmol/L (ref 22–32)
Calcium: 9.4 mg/dL (ref 8.9–10.3)
Chloride: 99 mmol/L (ref 98–111)
Creatinine, Ser: 0.64 mg/dL (ref 0.44–1.00)
GFR calc Af Amer: >60 mL/min (ref >60)
GFR calc non Af Amer: >60 mL/min (ref >60)
Glucose, Bld: 99 mg/dL (ref 70–99)
Potassium: 3.7 mmol/L (ref 3.5–5.1)
Sodium: 134 mmol/L — ABNORMAL LOW (ref 135–145)
Total Bilirubin: 1 mg/dL (ref 0.3–1.2)
Total Protein: 6.3 g/dL — ABNORMAL LOW (ref 6.5–8.1)

## 2020-04-07 LAB — URINALYSIS, ROUTINE W REFLEX MICROSCOPIC
Bilirubin Urine: NEGATIVE
Glucose, UA: NEGATIVE mg/dL
Hgb urine dipstick: NEGATIVE
Ketones, ur: NEGATIVE mg/dL
Leukocytes,Ua: NEGATIVE
Nitrite: NEGATIVE
Protein, ur: NEGATIVE mg/dL
Specific Gravity, Urine: 1.004 — ABNORMAL LOW (ref 1.005–1.030)
pH: 6 (ref 5.0–8.0)

## 2020-04-07 LAB — LIPASE, BLOOD: Lipase: 21 U/L (ref 11–51)

## 2020-04-07 MED ORDER — POLYETHYLENE GLYCOL 3350 17 G PO PACK: 17.0000 g | PACK | Freq: Two times a day (BID) | ORAL | 0 refills | Status: DC

## 2020-04-07 MED ORDER — DOCUSATE SODIUM 100 MG PO CAPS
100.0000 mg | ORAL_CAPSULE | Freq: Two times a day (BID) | ORAL | 0 refills | Status: DC
Start: 2020-04-07 — End: 2020-04-07

## 2020-04-07 MED ORDER — DOCUSATE SODIUM 100 MG PO CAPS
100.0000 mg | ORAL_CAPSULE | Freq: Two times a day (BID) | ORAL | 0 refills | Status: DC
Start: 2020-04-07 — End: 2020-04-17

## 2020-04-07 NOTE — Discharge Instructions (Addendum)
I recommend increase water intake and increase fiber intake to help with constipation.  Please use MiraLAX and Colace twice daily.  You may hold these medications if there are loose stools, diarrhea.  Please keep your Foley catheter in place at this time and follow-up with your urologist next week.  Your labs, urine were reassuring today.  Your urine did not show any sign of infection.

## 2020-04-07 NOTE — ED Triage Notes (Signed)
Pt BIB [facility] for evaluation of abdominal pain. Bilateral lower quadrants 6/10 pain, rigid & tender to palpation. Pt reports dehydration, trouble having bowel movements. Pt A&O on arrival, VSS w EMS.

## 2020-04-07 NOTE — ED Notes (Signed)
Bladder scan revealed 

## 2020-04-07 NOTE — ED Provider Notes (Signed)
TIME SEEN: 3:52 AM  CHIEF COMPLAINT: Urinary retention  HPI: Patient is a 86 year old female with history of hypertension, hyperlipidemia, A. fib on Xarelto who presents to the emergency department with urinary retention.  Last able to empty her bladder yesterday afternoon at some point.  Patient recently had a Foley catheter removed with a [facility] urology on Tuesday for urinary retention.  Daughter reports she did have some gross hematuria at that time.  Not on antibiotics.  She denies fevers, nausea, vomiting, diarrhea.  Has had previous C-sections.  Daughter is also very concerned that patient has been constipated.  She was able to have a bowel movement today but states it was a small amount of very hard stool.  Daughter reports he has only tried 2 tablets of Colace once that her daughter brought to her at [facility]  She does not have orders for a bowel regimen at the nursing facility.  ROS: See HPI Constitutional: no fever  Eyes: no drainage  ENT: no runny nose   Cardiovascular:  no chest pain  Resp: no SOB  GI: no vomiting GU: no dysuria Integumentary: no rash  Allergy: no hives  Musculoskeletal: no leg swelling  Neurological: no slurred speech ROS otherwise negative  PAST MEDICAL HISTORY/PAST SURGICAL HISTORY:  Past Medical History:  Diagnosis Date  . Atrial fibrillation ([facility])   . Hyperlipidemia   . Hypertension   . Hypothyroidism   . Mitral regurgitation   . MVP (mitral valve prolapse)     MEDICATIONS:  Prior to Admission medications   Medication Sig Start Date End Date Taking? Authorizing Provider  acetaminophen (TYLENOL) 325 MG tablet Take 1-2 tablets (325-650 mg total) by mouth every 6 (six) hours as needed for mild pain (pain score 1-3 or temp > 100.5). 03/08/20   Joseph Art, DO  acidophilus (RISAQUAD) CAPS capsule Take 1 capsule by mouth daily. 03/08/20   Joseph Art, DO  B Complex-C (B-COMPLEX WITH VITAMIN C) tablet Take 1 tablet by mouth daily. 03/08/20    Joseph Art, DO  diltiazem (CARDIZEM SR) 120 MG 12 hr capsule Take 1 capsule (120 mg total) by mouth 2 (two) times daily. 01/23/20   Lewayne Bunting, MD  docusate sodium (COLACE) 100 MG capsule Take 1 capsule (100 mg total) by mouth 2 (two) times daily. 03/08/20   Joseph Art, DO  levothyroxine (SYNTHROID) 75 MCG tablet TAKE 1 TABLET (75 MCG TOTAL) BY MOUTH DAILY BEFORE BREAKFAST. 12/21/19   Pincus Sanes, MD  metoprolol tartrate (LOPRESSOR) 25 MG tablet Take 0.5 tablets (12.5 mg total) by mouth 2 (two) times daily. 03/08/20   Marlin Canary U, DO  pravastatin (PRAVACHOL) 10 MG tablet TAKE 1 TABLET BY MOUTH EVERY DAY 03/12/20   Pincus Sanes, MD  Rivaroxaban (XARELTO) 15 MG TABS tablet Take 1 tablet (15 mg total) by mouth daily with supper. 03/08/20   Joseph Art, DO  Vitamin D, Ergocalciferol, (DRISDOL) 1.25 MG (50000 UNIT) CAPS capsule Take 1 capsule (50,000 Units total) by mouth every 7 (seven) days. Patient taking differently: Take 50,000 Units by mouth every 7 (seven) days. Thursday 01/24/20   Pincus Sanes, MD  XARELTO 20 MG TABS tablet TAKE 1 TABLET (20 MG TOTAL) BY MOUTH DAILY WITH SUPPER 03/16/20   Pincus Sanes, MD  Zinc Oxide (TRIPLE PASTE) 12.8 % ointment Apply topically as needed for irritation. 03/08/20   Joseph Art, DO    ALLERGIES:  Allergies  Allergen Reactions  .  Codeine Other (See Comments)    unknown  . Penicillins Other (See Comments)    unknown  . Vancomycin Other (See Comments)    unknown  . Zithromax [Azithromycin] Other (See Comments)    unknown  . Latex Rash    SOCIAL HISTORY:  Social History   Tobacco Use  . Smoking status: Former Games developer  . Smokeless tobacco: Never Used  Substance Use Topics  . Alcohol use: Not Currently    FAMILY HISTORY: Family History  Problem Relation Age of Onset  . Cancer Mother   . Dementia Mother     EXAM: BP 123/67 (BP Location: Right Arm)   Pulse 93   Temp 98.2 F (36.8 C) (Oral)   Resp 14   Ht 5\' 3"  (1.6  m)   Wt 60.3 kg   LMP  (LMP Unknown)   SpO2 98%   BMI 23.55 kg/m  CONSTITUTIONAL: Alert and oriented and responds appropriately to questions.  Elderly, appears uncomfortable but not in acute distress, afebrile, nontoxic HEAD: Normocephalic EYES: Conjunctivae clear, pupils appear equal, EOM appear intact ENT: normal nose; moist mucous membranes NECK: Supple, normal ROM CARD: RRR; S1 and S2 appreciated; no murmurs, no clicks, no rubs, no gallops RESP: Normal chest excursion without splinting or tachypnea; breath sounds clear and equal bilaterally; no wheezes, no rhonchi, no rales, no hypoxia or respiratory distress, speaking full sentences ABD/GI: Normal bowel sounds; distended lower abdomen consistent with full bladder, non-tender, no rebound, no guarding, no peritoneal signs, no hepatosplenomegaly BACK:  The back appears normal EXT: Normal ROM in all joints; no deformity noted, no edema; no cyanosis SKIN: Normal color for age and race; warm; no rash on exposed skin NEURO: Moves all extremities equally PSYCH: The patient's mood and manner are appropriate.   MEDICAL DECISION MAKING: Patient here with what I suspect is urinary retention.  Will bladder scan and Place Foley catheter.  Labs, urine pending.  ED PROGRESS: Labs reassuring.  Foley catheter placed due to bladder scan showing only 700 mL.  Patient feeling better.  Urine pending.  Daughter continues to be very concerned regarding patient's constipation.  Will give soapsuds enema.  Will discharge on bowel regimen.  Patient and daughter comfortable with this plan.  She will follow up with her outpatient urologist.  At this time, I do not feel there is any life-threatening condition present. I have reviewed, interpreted and discussed all results (EKG, imaging, lab, urine as appropriate) and exam findings with patient/family. I have reviewed nursing notes and appropriate previous records.  I feel the patient is safe to be discharged home  without further emergent workup and can continue workup as an outpatient as needed. Discussed usual and customary return precautions. Patient/family verbalize understanding and are comfortable with this plan.  Outpatient follow-up has been provided as needed. All questions have been answered.    Grace Alvarado was evaluated in Emergency Department on 04/07/2020 for the symptoms described in the history of present illness. She was evaluated in the context of the global COVID-19 pandemic, which necessitated consideration that the patient might be at risk for infection with the SARS-CoV-2 virus that causes COVID-19. Institutional protocols and algorithms that pertain to the evaluation of patients at risk for COVID-19 are in a state of rapid change based on information released by regulatory bodies including the CDC and federal and state organizations. These policies and algorithms were followed during the patient's care in the ED.      Treyvonne Tata, 06/08/2020, DO 04/07/20 [phone]

## 2020-04-08 NOTE — Progress Notes (Signed)
Subjective:    Patient ID: Grace Alvarado, female    DOB: [birth year], 86 y.o.   MRN: [id number]  HPI The patient is here for follow from the hospital and rehab.   Admitted 5/28 - 6/3.  Discharged to SNF  She went in for left knee infection.  She had fallen and landed on her knees.  The left knee became very swollen and she was seen in the ED 5/19.  She followed up with Dr Denyse Amass and was prescribed an antibiotic for traumatic prepatellar bursitis. It got worse and was given clindamycin and discharged.  There was no improvement and she was sent to the ED.  She had no fever.    Prepatellar bursitis: S/p evacuation of left hematoma and wound VAC Pain was controlled Follow up with ortho  Paroxysmal Afib, htn: cardizem and xarelto continued Low dose BB prn  Acute hyponatremia: Na dropped to 125 Not SIADH per Dr Jacqulyn Bath Corrected Improved per last blood work  Acute urinary retention/constipation: Needed straight cath 5/28, next day was still retaining so foley inserted Foley removed 5/31   Constipation: Bowel regimen  HLD: pravachol  Hypothyroidism: Synthroid   ED 7/3 for urinary retention.  Had seen urology and had foley that was removed a few days prior.  Foley was placed.  She also had constipation and was given a soapsuds enema and discharged on a bowel regimen.  Urine sent for culture and show enterococcus faecalis.      She is at [facility] now - there for one week.  She will be there for an additional 3 weeks.  [facility] and nursing.  She needs nursing for cath care and likely sacral ulcer pressure monitoring.  She has not started PT.    She sees Dr August Saucer tomorrow.  She has minimal pain.  He does get stiff at times.  She is walker with a walker.   She needs to have someone with her when she is walking.  She has a sacral ulcer that is stage I.  She did not realize that she had it-she thought it was just related to her chronic back  pain.      Applying for veterans assistance-her husband was a Cytogeneticist.  Her daughters are hoping they can get help at home when she returns home in 3 weeks.  They will complete their part of the paperwork and bring in for [place] to complete.   Medications and allergies reviewed with patient and updated if appropriate.  Patient Active Problem List   Diagnosis Date Noted  . Hyponatremia 03/07/2020  . DNR (do not resuscitate) 03/07/2020  . Prepatellar bursitis, left knee 03/02/2020  . Neuropathy 01/24/2020  . Chronic back pain 01/24/2020  . Complaints of leg weakness 01/24/2020  . Compression fracture of T11 vertebra (HCC) 01/23/2020  . Leg pain 01/03/2020  . Chronic anticoagulation 01/03/2020  . Compression fracture of L1 lumbar vertebra (HCC) 07/27/2019  . Low back pain 07/20/2019  . Hyperglycemia 05/01/2019  . Neck pain, musculoskeletal 11/16/2018  . Constipation 11/16/2018  . Hypokalemia   . AVM (arteriovenous malformation) of colon   . Lower GI bleed 11/06/2018  . Iron deficiency anemia 11/02/2018  . Lymphedema of left lower extremity 10/22/2018  . B12 deficiency 05/27/2018  . Dependent edema 05/25/2018  . Pulmonary hypertension, primary (HCC) 05/20/2018  . Dyslipidemia 05/20/2018  . CHF (congestive heart failure) (HCC) 05/20/2018  . Tricuspid regurgitation 05/17/2018  . Mitral regurgitation 05/17/2018  .  Chronic a-fib 05/17/2018  . Essential hypertension 05/17/2018  . Hypothyroid 05/17/2018  . CAD (coronary artery disease) 05/17/2018  . Carotid atherosclerosis, bilateral 05/17/2018    Current Outpatient Medications on File Prior to Visit  Medication Sig Dispense Refill  . acetaminophen (TYLENOL) 325 MG tablet Take 1-2 tablets (325-650 mg total) by mouth every 6 (six) hours as needed for mild pain (pain score 1-3 or temp > 100.5).    Marland Kitchen acidophilus (RISAQUAD) CAPS capsule Take 1 capsule by mouth daily.    . B Complex-C (B-COMPLEX WITH VITAMIN C) tablet Take 1 tablet by  mouth daily.    Marland Kitchen diltiazem (CARDIZEM) 120 MG tablet Take 120 mg by mouth in the morning and at bedtime.    . docusate sodium (COLACE) 100 MG capsule Take 1 capsule (100 mg total) by mouth every 12 (twelve) hours. Hold for loose bowel movements 60 capsule 0  . furosemide (LASIX) 20 MG tablet Take 20 mg by mouth daily.    . Infant Care Products (DERMACLOUD) CREA Apply 1 application topically in the morning and at bedtime. Apply to buttocks.    Marland Kitchen levothyroxine (SYNTHROID) 75 MCG tablet TAKE 1 TABLET (75 MCG TOTAL) BY MOUTH DAILY BEFORE BREAKFAST. 90 tablet 1  . polyethylene glycol (MIRALAX) 17 g packet Take 17 g by mouth 2 (two) times daily. Hold for loose bowel movements 60 each 0  . potassium chloride (KLOR-CON) 10 MEQ tablet Take 10 mEq by mouth daily.    . pravastatin (PRAVACHOL) 10 MG tablet TAKE 1 TABLET BY MOUTH EVERY DAY (Patient taking differently: Take 10 mg by mouth daily. ) 90 tablet 3  . Rivaroxaban (XARELTO) 15 MG TABS tablet Take 1 tablet (15 mg total) by mouth daily with supper. 42 tablet   . Vitamin D, Ergocalciferol, (DRISDOL) 1.25 MG (50000 UNIT) CAPS capsule Take 1 capsule (50,000 Units total) by mouth every 7 (seven) days. (Patient taking differently: Take 50,000 Units by mouth every 7 (seven) days. Thursday) 8 capsule 0  . [DISCONTINUED] metoprolol tartrate (LOPRESSOR) 25 MG tablet Take 0.5 tablets (12.5 mg total) by mouth 2 (two) times daily. (Patient not taking: Reported on 04/07/2020)     No current facility-administered medications on file prior to visit.    Past Medical History:  Diagnosis Date  . Atrial fibrillation (HCC)   . Hyperlipidemia   . Hypertension   . Hypothyroidism   . Mitral regurgitation   . MVP (mitral valve prolapse)     Past Surgical History:  Procedure Laterality Date  . APPENDECTOMY    . c section    . CESAREAN SECTION    . CHOLECYSTECTOMY    . COLONOSCOPY WITH PROPOFOL N/A 11/09/2018   Procedure: COLONOSCOPY WITH PROPOFOL;  Surgeon: Sherrilyn Rist, MD;  Location: [facility] ENDOSCOPY;  Service: Gastroenterology;  Laterality: N/A;  . ESOPHAGOGASTRODUODENOSCOPY (EGD) WITH PROPOFOL N/A 11/09/2018   Procedure: ESOPHAGOGASTRODUODENOSCOPY (EGD) WITH PROPOFOL;  Surgeon: Sherrilyn Rist, MD;  Location: [facility] ENDOSCOPY;  Service: Gastroenterology;  Laterality: N/A;  . HOT HEMOSTASIS N/A 11/09/2018   Procedure: HOT HEMOSTASIS (ARGON PLASMA COAGULATION/BICAP);  Surgeon: Sherrilyn Rist, MD;  Location: [facility] ENDOSCOPY;  Service: Gastroenterology;  Laterality: N/A;  . I & D EXTREMITY Left 03/02/2020   Procedure: evacuation of left knee hematoma, possible wound vac placement;  Surgeon: Cammy Copa, MD;  Location: [facility] OR;  Service: Orthopedics;  Laterality: Left;  . SUBMUCOSAL INJECTION  11/09/2018   Procedure: SUBMUCOSAL INJECTION;  Surgeon: Charlie Pitter III,  MD;  Location: [facility] ENDOSCOPY;  Service: Gastroenterology;;    Social History   Socioeconomic History  . Marital status: Widowed    Spouse name: Not on file  . Number of children: 3  . Years of education: Not on file  . Highest education level: Not on file  Occupational History  . Occupation: retired  Tobacco Use  . Smoking status: Former Games developer  . Smokeless tobacco: Never Used  Vaping Use  . Vaping Use: Never used  Substance and Sexual Activity  . Alcohol use: Not Currently  . Drug use: Never  . Sexual activity: Not Currently  Other Topics Concern  . Not on file  Social History Narrative  . Not on file   Social Determinants of Health   Financial Resource Strain:   . Difficulty of Paying Living Expenses:   Food Insecurity:   . Worried About Programme researcher, broadcasting/film/video in the Last Year:   . Barista in the Last Year:   Transportation Needs:   . Freight forwarder (Medical):   Marland Kitchen Lack of Transportation (Non-Medical):   Physical Activity:   . Days of Exercise per Week:   . Minutes of Exercise per Session:   Stress:   . Feeling of Stress :   Social Connections:   .  Frequency of Communication with Friends and Family:   . Frequency of Social Gatherings with Friends and Family:   . Attends Religious Services:   . Active Member of Clubs or Organizations:   . Attends Banker Meetings:   Marland Kitchen Marital Status:     Family History  Problem Relation Age of Onset  . Cancer Mother   . Dementia Mother     Review of Systems  Constitutional: Positive for appetite change (decreased). Negative for chills and fever.  Respiratory: Negative for cough, shortness of breath and wheezing.   Cardiovascular: Positive for leg swelling. Negative for chest pain and palpitations.  Neurological: Positive for headaches (rare - transient). Negative for dizziness and light-headedness.       Objective:   Vitals:   04/10/20 0953  BP: 110/62  Pulse: 100  Temp: 98.1 F (36.7 C)  SpO2: 98%   BP Readings from Last 3 Encounters:  04/10/20 110/62  04/07/20 (!) 109/51  03/08/20 (!) 111/54   Wt Readings from Last 3 Encounters:  04/07/20 132 lb 15 oz (60.3 kg)  03/02/20 133 lb (60.3 kg)  02/24/20 135 lb (61.2 kg)   Body mass index is 23.55 kg/m.   Physical Exam    Constitutional: Appears well-developed and well-nourished. No distress.  HENT:  Head: Normocephalic and atraumatic.  Neck: Neck supple. No tracheal deviation present. No thyromegaly present.  No cervical lymphadenopathy Cardiovascular: Normal rate, irregular rhythm and normal heart sounds.   3/6 systolic murmur heard. No carotid bruit .  No edema in right lower extremity.  1+ pitting edema left lower extremity up to posterior upper leg Pulmonary/Chest: Effort normal and breath sounds normal. No respiratory distress. No has no wheezes. No rales. Abdomen: Soft, nontender, nondistended Skin: Skin is warm and dry. Not diaphoretic.  Psychiatric: Normal mood and affect. Behavior is normal.      Assessment & Plan:    See Problem List for Assessment and Plan of chronic medical problems.    This  visit occurred during the SARS-CoV-2 public health emergency.  Safety protocols were in place, including screening questions prior to the visit, additional usage of staff PPE, and extensive cleaning of  exam room while observing appropriate contact time as indicated for disinfecting solutions.

## 2020-04-09 ENCOUNTER — Encounter: Payer: Self-pay | Admitting: Internal Medicine

## 2020-04-09 LAB — URINE CULTURE: Culture: >=100000 — AB

## 2020-04-09 NOTE — Patient Instructions (Addendum)
Medications reviewed and updated.  Changes include :   none    Please followup in September as scheduled

## 2020-04-10 ENCOUNTER — Other Ambulatory Visit: Payer: Self-pay

## 2020-04-10 ENCOUNTER — Ambulatory Visit: Payer: Medicare Other | Admitting: Internal Medicine

## 2020-04-10 ENCOUNTER — Encounter: Payer: Self-pay | Admitting: Internal Medicine

## 2020-04-10 VITALS — BP 110/62 | HR 100 | Temp 98.1°F | Ht 63.0 in

## 2020-04-10 DIAGNOSIS — E038 Other specified hypothyroidism: Secondary | ICD-10-CM | POA: Diagnosis not present

## 2020-04-10 DIAGNOSIS — Z978 Presence of other specified devices: Secondary | ICD-10-CM

## 2020-04-10 DIAGNOSIS — R609 Edema, unspecified: Secondary | ICD-10-CM

## 2020-04-10 DIAGNOSIS — I10 Essential (primary) hypertension: Secondary | ICD-10-CM

## 2020-04-10 DIAGNOSIS — K5900 Constipation, unspecified: Secondary | ICD-10-CM

## 2020-04-10 DIAGNOSIS — M7042 Prepatellar bursitis, left knee: Secondary | ICD-10-CM

## 2020-04-10 DIAGNOSIS — L98421 Non-pressure chronic ulcer of back limited to breakdown of skin: Secondary | ICD-10-CM

## 2020-04-10 NOTE — Assessment & Plan Note (Signed)
Chronic, euthyroid on exam Last TSH in normal range Continue current dose of levothyroxine

## 2020-04-10 NOTE — Assessment & Plan Note (Signed)
Acute Stage I Stressed avoiding too much pressure on this area and adjusting positions frequently Has nursing to monitor

## 2020-04-10 NOTE — Assessment & Plan Note (Signed)
Acute Secondary to urinary retention Following with urology Urine culture showed possible infection when she was in the emergency room last weekend-clinically there does not appear to be an infection so we will not treat-possible colonization She sees urology in 2 days

## 2020-04-10 NOTE — Assessment & Plan Note (Signed)
Chronic Did have some CHF while at the nursing home-Lasix restarted Current edema in his left lower extremity only and related to knee injury/surgery Continue current Lasix dose

## 2020-04-10 NOTE — Telephone Encounter (Signed)
Faxed today

## 2020-04-10 NOTE — Assessment & Plan Note (Signed)
Chronic BP well controlled Current regimen effective and well tolerated Continue current medications at current doses  

## 2020-04-10 NOTE — Assessment & Plan Note (Addendum)
Subacute With large hematoma s/p evacuation and wound VAC, antibiotics, hospitalization Following with orthopedics-has follow-up tomorrow Walking with a walker Will have PT hopefully starting this week

## 2020-04-10 NOTE — Telephone Encounter (Signed)
Please give verbal for BP, weight HR daily at [employer].   Also schedule toilet trips - taking her to the toilet Q 3 hrs.

## 2020-04-10 NOTE — Progress Notes (Signed)
ED Antimicrobial Stewardship Positive Culture Follow Up   Grace Alvarado is an 86 y.o. female who presented to [facility] on 04/07/2020 with a chief complaint of  Chief Complaint  Patient presents with  . Abdominal Pain    Recent Results (from the past 720 hour(s))  Urine culture     Status: Abnormal   Collection Time: 04/07/20  5:20 AM   Specimen: Urine, Random  Result Value Ref Range Status   Specimen Description URINE, RANDOM  Final   Special Requests   Final    NONE Performed at [facility], [address], [place], [place] [address]    Culture >=100,000 COLONIES/mL ENTEROCOCCUS FAECALIS (A)  Final   Report Status 04/09/2020 FINAL  Final   Organism ID, Bacteria ENTEROCOCCUS FAECALIS (A)  Final      Susceptibility   Enterococcus faecalis - MIC*    AMPICILLIN <=2 SENSITIVE Sensitive     NITROFURANTOIN <=16 SENSITIVE Sensitive     VANCOMYCIN 2 SENSITIVE Sensitive     * >=100,000 COLONIES/mL ENTEROCOCCUS FAECALIS    []  Treated with N/A, organism resistant to prescribed antimicrobial [x]  Patient discharged originally without antimicrobial agent and treatment may be indicated  New antibiotic prescription: Likely does not need treatment due to lack of symptoms but pt should follow-up with urology. Informed primary care of her positive culture.   ED Provider: , PA-C   Evangelyn Crouse, 04/10/2020, 9:25 AM Clinical Pharmacist Monday - Friday phone -  [phone] Saturday - Sunday phone - [phone]

## 2020-04-10 NOTE — Telephone Encounter (Signed)
April- Kinderd at [facility] [phone]  Called and spoke with April today and verbal given.

## 2020-04-10 NOTE — Assessment & Plan Note (Signed)
Chronic Worse since being in the nursing home and now at assisted living facility Currently on Colace twice daily and MiraLAX twice daily We will continue that regimen-can adjust if needed Since she is having some difficulty her and her family would like her to be taken to the bathroom on a regular schedule-we will request every 3 hour schedule toileting

## 2020-04-10 NOTE — Telephone Encounter (Signed)
Ok to give verbals  - cath 16/10

## 2020-04-11 ENCOUNTER — Telehealth: Payer: Self-pay | Admitting: Orthopedic Surgery

## 2020-04-11 ENCOUNTER — Ambulatory Visit (INDEPENDENT_AMBULATORY_CARE_PROVIDER_SITE_OTHER): Payer: Medicare Other | Admitting: Orthopedic Surgery

## 2020-04-11 DIAGNOSIS — S8002XD Contusion of left knee, subsequent encounter: Secondary | ICD-10-CM

## 2020-04-11 NOTE — Telephone Encounter (Signed)
IC verbal given.  

## 2020-04-11 NOTE — Telephone Encounter (Signed)
Flordeluna ( physical therapist) called from [email or web address]  requesting verbal orders of home health pt. Flordeluna is requesting twice a week for 5 weeks of physical therapy for patient. Flordeluna would like a call back from Dr. Diamantina [facility] office with approval of pt. Flordeluna phone number is [phone].

## 2020-04-12 ENCOUNTER — Encounter: Payer: Self-pay | Admitting: Internal Medicine

## 2020-04-12 ENCOUNTER — Encounter: Payer: Self-pay | Admitting: Orthopedic Surgery

## 2020-04-13 NOTE — Telephone Encounter (Signed)
Note not dictated - do not use topical on knee

## 2020-04-14 ENCOUNTER — Encounter: Payer: Self-pay | Admitting: Orthopedic Surgery

## 2020-04-14 NOTE — Progress Notes (Signed)
Post-Op Visit Note   Patient: Grace Alvarado           Date of Birth: 1924-11-25           MRN: [id number] Visit Date: 04/11/2020 PCP: Pincus Sanes, MD   Assessment & Plan:  Chief Complaint:  Chief Complaint  Patient presents with  . Left Knee - Follow-up   Visit Diagnoses:  1. Traumatic hematoma of left knee, subsequent encounter     Plan: Grace Alvarado is a 86 year old patient here for wound check left knee after traumatic hematoma debridement.  She is moved to a new skilled nursing facility.  On examination she has some black eschar about the size of a quarter over the anterior patellar region.  No evidence of infection induration fluctuance or drainage.  Knee range of motion is excellent and strength is improving.  All in all I think Grace Alvarado is doing well from this.  Come back in 4 weeks for final check.  Okay for weightbearing as tolerated.  Follow-Up Instructions: Return in about 4 weeks (around 05/09/2020).   Orders:  No orders of the defined types were placed in this encounter.  No orders of the defined types were placed in this encounter.   Imaging: No results found.  PMFS History: Patient Active Problem List   Diagnosis Date Noted  . Skin ulcer of sacrum, limited to breakdown of skin (HCC) 04/10/2020  . Foley catheter in place 04/10/2020  . Hyponatremia 03/07/2020  . DNR (do not resuscitate) 03/07/2020  . Prepatellar bursitis, left knee 03/02/2020  . Neuropathy 01/24/2020  . Chronic back pain 01/24/2020  . Complaints of leg weakness 01/24/2020  . Compression fracture of T11 vertebra (HCC) 01/23/2020  . Leg pain 01/03/2020  . Chronic anticoagulation 01/03/2020  . Compression fracture of L1 lumbar vertebra (HCC) 07/27/2019  . Low back pain 07/20/2019  . Hyperglycemia 05/01/2019  . Neck pain, musculoskeletal 11/16/2018  . Constipation 11/16/2018  . Hypokalemia   . AVM (arteriovenous malformation) of colon   . Lower GI bleed 11/06/2018  . Iron deficiency anemia  11/02/2018  . Lymphedema of left lower extremity 10/22/2018  . B12 deficiency 05/27/2018  . Dependent edema 05/25/2018  . Pulmonary hypertension, primary (HCC) 05/20/2018  . Dyslipidemia 05/20/2018  . CHF (congestive heart failure) (HCC) 05/20/2018  . Tricuspid regurgitation 05/17/2018  . Mitral regurgitation 05/17/2018  . Chronic a-fib 05/17/2018  . Essential hypertension 05/17/2018  . Hypothyroid 05/17/2018  . CAD (coronary artery disease) 05/17/2018  . Carotid atherosclerosis, bilateral 05/17/2018   Past Medical History:  Diagnosis Date  . Atrial fibrillation (HCC)   . Hyperlipidemia   . Hypertension   . Hypothyroidism   . Mitral regurgitation   . MVP (mitral valve prolapse)     Family History  Problem Relation Age of Onset  . Cancer Mother   . Dementia Mother     Past Surgical History:  Procedure Laterality Date  . APPENDECTOMY    . c section    . CESAREAN SECTION    . CHOLECYSTECTOMY    . COLONOSCOPY WITH PROPOFOL N/A 11/09/2018   Procedure: COLONOSCOPY WITH PROPOFOL;  Surgeon: Sherrilyn Rist, MD;  Location: [facility] ENDOSCOPY;  Service: Gastroenterology;  Laterality: N/A;  . ESOPHAGOGASTRODUODENOSCOPY (EGD) WITH PROPOFOL N/A 11/09/2018   Procedure: ESOPHAGOGASTRODUODENOSCOPY (EGD) WITH PROPOFOL;  Surgeon: Sherrilyn Rist, MD;  Location: [facility] ENDOSCOPY;  Service: Gastroenterology;  Laterality: N/A;  . HOT HEMOSTASIS N/A 11/09/2018   Procedure: HOT HEMOSTASIS (ARGON PLASMA COAGULATION/BICAP);  Surgeon:  Sherrilyn Rist, MD;  Location: [facility] ENDOSCOPY;  Service: Gastroenterology;  Laterality: N/A;  . I & D EXTREMITY Left 03/02/2020   Procedure: evacuation of left knee hematoma, possible wound vac placement;  Surgeon: Cammy Copa, MD;  Location: [facility] OR;  Service: Orthopedics;  Laterality: Left;  . SUBMUCOSAL INJECTION  11/09/2018   Procedure: SUBMUCOSAL INJECTION;  Surgeon: Sherrilyn Rist, MD;  Location: [facility] ENDOSCOPY;  Service: Gastroenterology;;   Social History    Occupational History  . Occupation: retired  Tobacco Use  . Smoking status: Former Games developer  . Smokeless tobacco: Never Used  Vaping Use  . Vaping Use: Never used  Substance and Sexual Activity  . Alcohol use: Not Currently  . Drug use: Never  . Sexual activity: Not Currently

## 2020-04-16 ENCOUNTER — Encounter: Payer: Self-pay | Admitting: Internal Medicine

## 2020-04-17 ENCOUNTER — Encounter: Payer: Self-pay | Admitting: Internal Medicine

## 2020-04-17 DIAGNOSIS — R300 Dysuria: Secondary | ICD-10-CM

## 2020-04-17 MED ORDER — DOCUSATE SODIUM 100 MG PO CAPS: 100.0000 mg | ORAL_CAPSULE | Freq: Every day | ORAL | 0 refills | Status: DC

## 2020-04-17 MED ORDER — POLYETHYLENE GLYCOL 3350 17 G PO PACK: 17.0000 g | PACK | Freq: Every day | ORAL | 0 refills | Status: DC

## 2020-04-18 ENCOUNTER — Telehealth: Payer: Self-pay

## 2020-04-18 ENCOUNTER — Encounter: Payer: Self-pay | Admitting: Internal Medicine

## 2020-04-18 NOTE — Telephone Encounter (Signed)
Refaxed order to number given and fax conformation received.

## 2020-05-01 ENCOUNTER — Telehealth: Payer: Self-pay | Admitting: Internal Medicine

## 2020-05-01 MED ORDER — RIVAROXABAN 15 MG PO TABS: 15.0000 mg | ORAL_TABLET | Freq: Every day | ORAL | 0 refills | Status: DC

## 2020-05-01 NOTE — Telephone Encounter (Signed)
   Patient's daughter calling, states patient has no Xarelto Prescription for Xarelto not found per daughter after leaving respite care Pharmacy:[id number] - [place], [place] - [address]

## 2020-05-01 NOTE — Telephone Encounter (Signed)
I will send a prescription to the pharmacy so she can get this immediately-in the future this should come from her cardiologist

## 2020-05-02 NOTE — Telephone Encounter (Signed)
Called and left message for patient regarding refill and also her daughter as well that next refill will need to come from the Cardiologist.

## 2020-05-03 ENCOUNTER — Telehealth: Payer: Self-pay | Admitting: Cardiology

## 2020-05-03 NOTE — Telephone Encounter (Signed)
Grace Alvarado is calling stating Grace Alvarado is needing an appointment to discuss recent medication changes as well as weight loss. I advised. Grace Alvarado Dr. Ludwig Clarks first available is not until October and the first available with a PA is virtual on 05/21/20. Grace Alvarado is requesting a nurse call her to discuss getting worked in. Please advise.

## 2020-05-03 NOTE — Telephone Encounter (Signed)
LMTCB

## 2020-05-03 NOTE — Telephone Encounter (Signed)
Grace Alvarado  [phone]   Pts daughter called asking for an appt with anyone the first 2 weeks of August when she will be here in town with the pt.. she says it can be virtual but she wants a provider to review her meds with her since she is worried she is taking too many meds.   She says she has had significant weight loss, and she is very tired. She is unsure of her BP and HR. She is uneasy with her care at the nursing facility and the way her meds are being handled.   I was unable to find an appt time for her at either office with any APP.   I advised her that I will forward to Dr. Darel Hong for review and possibility that Dr. Jens Som can speak with her for a virtual visit at some point.   She had refused 8/16 and 8/17 that was offered to her originally.

## 2020-05-04 ENCOUNTER — Other Ambulatory Visit: Payer: Self-pay

## 2020-05-04 MED ORDER — RIVAROXABAN 15 MG PO TABS: 15.0000 mg | ORAL_TABLET | Freq: Every day | ORAL | 0 refills | Status: DC

## 2020-05-07 ENCOUNTER — Encounter: Payer: Self-pay | Admitting: Internal Medicine

## 2020-05-09 ENCOUNTER — Encounter: Payer: Self-pay | Admitting: Internal Medicine

## 2020-05-09 ENCOUNTER — Ambulatory Visit (INDEPENDENT_AMBULATORY_CARE_PROVIDER_SITE_OTHER): Payer: Medicare Other | Admitting: Orthopedic Surgery

## 2020-05-09 ENCOUNTER — Encounter: Payer: Self-pay | Admitting: Orthopedic Surgery

## 2020-05-09 DIAGNOSIS — S8002XD Contusion of left knee, subsequent encounter: Secondary | ICD-10-CM

## 2020-05-09 NOTE — Progress Notes (Signed)
Post-Op Visit Note   Patient: Grace Alvarado           Date of Birth: 09/16/1925           MRN: [id number] Visit Date: 05/09/2020 PCP: Pincus Sanes, MD   Assessment & Plan:  Chief Complaint:  Chief Complaint  Patient presents with  . Left Knee - Follow-up   Visit Diagnoses:  1. Traumatic hematoma of left knee, subsequent encounter     Plan: Grace Alvarado is a 86 year old patient left knee hematoma.  Doing well.  Range of motion is full.  Small quarter sized eschar present with no underlying hematoma.  No calf tenderness negative Homans today.  Range of motion excellent.  She is on a walker.  Plan is continue therapy for leg strengthening and balance.  Follow-up as needed.  Follow-Up Instructions: Return if symptoms worsen or fail to improve.   Orders:  No orders of the defined types were placed in this encounter.  No orders of the defined types were placed in this encounter.   Imaging: No results found.  PMFS History: Patient Active Problem List   Diagnosis Date Noted  . Skin ulcer of sacrum, limited to breakdown of skin (HCC) 04/10/2020  . Foley catheter in place 04/10/2020  . Hyponatremia 03/07/2020  . DNR (do not resuscitate) 03/07/2020  . Prepatellar bursitis, left knee 03/02/2020  . Neuropathy 01/24/2020  . Chronic back pain 01/24/2020  . Complaints of leg weakness 01/24/2020  . Compression fracture of T11 vertebra (HCC) 01/23/2020  . Leg pain 01/03/2020  . Chronic anticoagulation 01/03/2020  . Compression fracture of L1 lumbar vertebra (HCC) 07/27/2019  . Low back pain 07/20/2019  . Hyperglycemia 05/01/2019  . Neck pain, musculoskeletal 11/16/2018  . Constipation 11/16/2018  . Hypokalemia   . AVM (arteriovenous malformation) of colon   . Lower GI bleed 11/06/2018  . Iron deficiency anemia 11/02/2018  . Lymphedema of left lower extremity 10/22/2018  . B12 deficiency 05/27/2018  . Dependent edema 05/25/2018  . Pulmonary hypertension, primary (HCC) 05/20/2018    . Dyslipidemia 05/20/2018  . CHF (congestive heart failure) (HCC) 05/20/2018  . Tricuspid regurgitation 05/17/2018  . Mitral regurgitation 05/17/2018  . Chronic a-fib 05/17/2018  . Essential hypertension 05/17/2018  . Hypothyroid 05/17/2018  . CAD (coronary artery disease) 05/17/2018  . Carotid atherosclerosis, bilateral 05/17/2018   Past Medical History:  Diagnosis Date  . Atrial fibrillation (HCC)   . Hyperlipidemia   . Hypertension   . Hypothyroidism   . Mitral regurgitation   . MVP (mitral valve prolapse)     Family History  Problem Relation Age of Onset  . Cancer Mother   . Dementia Mother     Past Surgical History:  Procedure Laterality Date  . APPENDECTOMY    . c section    . CESAREAN SECTION    . CHOLECYSTECTOMY    . COLONOSCOPY WITH PROPOFOL N/A 11/09/2018   Procedure: COLONOSCOPY WITH PROPOFOL;  Surgeon: Sherrilyn Rist, MD;  Location: [facility] ENDOSCOPY;  Service: Gastroenterology;  Laterality: N/A;  . ESOPHAGOGASTRODUODENOSCOPY (EGD) WITH PROPOFOL N/A 11/09/2018   Procedure: ESOPHAGOGASTRODUODENOSCOPY (EGD) WITH PROPOFOL;  Surgeon: Sherrilyn Rist, MD;  Location: [facility] ENDOSCOPY;  Service: Gastroenterology;  Laterality: N/A;  . HOT HEMOSTASIS N/A 11/09/2018   Procedure: HOT HEMOSTASIS (ARGON PLASMA COAGULATION/BICAP);  Surgeon: Sherrilyn Rist, MD;  Location: [facility] ENDOSCOPY;  Service: Gastroenterology;  Laterality: N/A;  . I & D EXTREMITY Left 03/02/2020   Procedure: evacuation of left knee  hematoma, possible wound vac placement;  Surgeon: Cammy Copa, MD;  Location: [facility] OR;  Service: Orthopedics;  Laterality: Left;  . SUBMUCOSAL INJECTION  11/09/2018   Procedure: SUBMUCOSAL INJECTION;  Surgeon: Sherrilyn Rist, MD;  Location: [facility] ENDOSCOPY;  Service: Gastroenterology;;   Social History   Occupational History  . Occupation: retired  Tobacco Use  . Smoking status: Former Games developer  . Smokeless tobacco: Never Used  Vaping Use  . Vaping Use: Never used   Substance and Sexual Activity  . Alcohol use: Not Currently  . Drug use: Never  . Sexual activity: Not Currently

## 2020-05-10 ENCOUNTER — Telehealth: Payer: Self-pay | Admitting: Orthopedic Surgery

## 2020-05-10 ENCOUNTER — Encounter: Payer: Self-pay | Admitting: Family Medicine

## 2020-05-10 ENCOUNTER — Other Ambulatory Visit: Payer: Self-pay

## 2020-05-10 DIAGNOSIS — R2689 Other abnormalities of gait and mobility: Secondary | ICD-10-CM

## 2020-05-10 DIAGNOSIS — E874 Mixed disorder of acid-base balance: Secondary | ICD-10-CM

## 2020-05-10 NOTE — Telephone Encounter (Signed)
Received call from [facility] (PT) with [facility] needing verbal orders for HHPT twice a week for 3 weeks     The number to contact Flore is [phone]

## 2020-05-10 NOTE — Telephone Encounter (Signed)
IC verbal given.  

## 2020-05-11 NOTE — Telephone Encounter (Signed)
Follow up scheduled

## 2020-05-14 ENCOUNTER — Ambulatory Visit: Payer: Medicare Other | Admitting: Cardiology

## 2020-05-14 ENCOUNTER — Encounter: Payer: Self-pay | Admitting: Cardiology

## 2020-05-14 ENCOUNTER — Other Ambulatory Visit: Payer: Self-pay

## 2020-05-14 VITALS — BP 126/70 | HR 89 | Temp 97.2°F | Ht 63.0 in | Wt 130.0 lb

## 2020-05-14 DIAGNOSIS — I340 Nonrheumatic mitral (valve) insufficiency: Secondary | ICD-10-CM | POA: Diagnosis not present

## 2020-05-14 DIAGNOSIS — I10 Essential (primary) hypertension: Secondary | ICD-10-CM

## 2020-05-14 DIAGNOSIS — I482 Chronic atrial fibrillation, unspecified: Secondary | ICD-10-CM

## 2020-05-14 MED ORDER — POTASSIUM CHLORIDE CRYS ER 10 MEQ PO TBCR: 10.0000 meq | EXTENDED_RELEASE_TABLET | ORAL | Status: DC

## 2020-05-14 MED ORDER — FUROSEMIDE 20 MG PO TABS: 20.0000 mg | ORAL_TABLET | ORAL | Status: DC

## 2020-05-14 NOTE — Patient Instructions (Signed)
Medication Instructions:   DECREASE FUROSEMIDE TO 20 MG EVERY OTHER DAY  DECREASE POTASSIUM TO 10 MEQ EVERY OTHER DAY  *If you need a refill on your cardiac medications before your next appointment, please call your pharmacy*   Lab Work: If you have labs (blood work) drawn today and your tests are completely normal, you will receive your results only by: Marland Kitchen MyChart Message (if you have MyChart) OR . A paper copy in the mail If you have any lab test that is abnormal or we need to change your treatment, we will call you to review the results.   Follow-Up: At [facility], you and your health needs are our priority.  As part of our continuing mission to provide you with exceptional heart care, we have created designated Provider Care Teams.  These Care Teams include your primary Cardiologist (physician) and Advanced Practice Providers (APPs -  Physician Assistants and Nurse Practitioners) who all work together to provide you with the care you need, when you need it.  We recommend signing up for the patient portal called "MyChart".  Sign up information is provided on this After Visit Summary.  MyChart is used to connect with patients for Virtual Visits (Telemedicine).  Patients are able to view lab/test results, encounter notes, upcoming appointments, etc.  Non-urgent messages can be sent to your provider as well.   To learn more about what you can do with MyChart, go to [email or web address].    Your next appointment:   6 month(s)  The format for your next appointment:   In Person  Provider:   You may see Olga Millers, MD or one of the following Advanced Practice Providers on your designated Care Team:    Corine Shelter, PA-C  [place], [place]  Edd Fabian, [place]

## 2020-05-14 NOTE — Progress Notes (Signed)
HPI: FU atrial fibrillation. Patient previously resided in [place].Patient with history of permanent atrial fibrillation. Previously failed amiodarone and multaq per records. Nuclear study 2012 showed no ischemia with normal LV function. Patient apparently made it clear previously that she did not want valve surgery. Echocardiogram January 2020 showed normal LV function, moderate biatrial enlargement, moderate to severe mitral regurgitation secondary to posterior prolapse.  ABIs April 2021 normal.  Since last seen she recently was discharged from rehabilitation following a fall with knee injury.  She denies dyspnea, chest pain, palpitations or syncope.  She does not typically fall.  She has not had bleeding.  Current Outpatient Medications  Medication Sig Dispense Refill  . acetaminophen (TYLENOL) 325 MG tablet Take 1-2 tablets (325-650 mg total) by mouth every 6 (six) hours as needed for mild pain (pain score 1-3 or temp > 100.5).    Marland Kitchen acidophilus (RISAQUAD) CAPS capsule Take 1 capsule by mouth daily.    . B Complex-C (B-COMPLEX WITH VITAMIN C) tablet Take 1 tablet by mouth daily.    Marland Kitchen diltiazem (CARDIZEM) 120 MG tablet Take 120 mg by mouth in the morning and at bedtime.    . docusate sodium (COLACE) 100 MG capsule Take 1 capsule (100 mg total) by mouth daily. Hold for loose bowel movements 60 capsule 0  . furosemide (LASIX) 20 MG tablet Take 20 mg by mouth daily.    . Infant Care Products (DERMACLOUD) CREA Apply 1 application topically in the morning and at bedtime. Apply to buttocks.    Marland Kitchen levothyroxine (SYNTHROID) 75 MCG tablet TAKE 1 TABLET (75 MCG TOTAL) BY MOUTH DAILY BEFORE BREAKFAST. 90 tablet 1  . polyethylene glycol (MIRALAX) 17 g packet Take 17 g by mouth daily. Hold for loose bowel movements 60 each 0  . potassium chloride (KLOR-CON) 10 MEQ tablet Take 10 mEq by mouth daily.    . pravastatin (PRAVACHOL) 10 MG tablet TAKE 1 TABLET BY MOUTH EVERY DAY (Patient taking differently:  Take 10 mg by mouth daily. ) 90 tablet 3  . Rivaroxaban (XARELTO) 15 MG TABS tablet Take 1 tablet (15 mg total) by mouth daily with supper. 30 tablet 0  . Vitamin D, Ergocalciferol, (DRISDOL) 1.25 MG (50000 UNIT) CAPS capsule Take 1 capsule (50,000 Units total) by mouth every 7 (seven) days. (Patient taking differently: Take 50,000 Units by mouth every 7 (seven) days. Thursday) 8 capsule 0   No current facility-administered medications for this visit.     Past Medical History:  Diagnosis Date  . Atrial fibrillation (HCC)   . Hyperlipidemia   . Hypertension   . Hypothyroidism   . Mitral regurgitation   . MVP (mitral valve prolapse)     Past Surgical History:  Procedure Laterality Date  . APPENDECTOMY    . c section    . CESAREAN SECTION    . CHOLECYSTECTOMY    . COLONOSCOPY WITH PROPOFOL N/A 11/09/2018   Procedure: COLONOSCOPY WITH PROPOFOL;  Surgeon: Sherrilyn Rist, MD;  Location: [facility] ENDOSCOPY;  Service: Gastroenterology;  Laterality: N/A;  . ESOPHAGOGASTRODUODENOSCOPY (EGD) WITH PROPOFOL N/A 11/09/2018   Procedure: ESOPHAGOGASTRODUODENOSCOPY (EGD) WITH PROPOFOL;  Surgeon: Sherrilyn Rist, MD;  Location: [facility] ENDOSCOPY;  Service: Gastroenterology;  Laterality: N/A;  . HOT HEMOSTASIS N/A 11/09/2018   Procedure: HOT HEMOSTASIS (ARGON PLASMA COAGULATION/BICAP);  Surgeon: Sherrilyn Rist, MD;  Location: [facility] ENDOSCOPY;  Service: Gastroenterology;  Laterality: N/A;  . I & D EXTREMITY Left 03/02/2020   Procedure: evacuation  of left knee hematoma, possible wound vac placement;  Surgeon: Cammy Copa, MD;  Location: [facility] OR;  Service: Orthopedics;  Laterality: Left;  . SUBMUCOSAL INJECTION  11/09/2018   Procedure: SUBMUCOSAL INJECTION;  Surgeon: Sherrilyn Rist, MD;  Location: [facility] ENDOSCOPY;  Service: Gastroenterology;;    Social History   Socioeconomic History  . Marital status: Widowed    Spouse name: Not on file  . Number of children: 3  . Years of education: Not on file  .  Highest education level: Not on file  Occupational History  . Occupation: retired  Tobacco Use  . Smoking status: Former Games developer  . Smokeless tobacco: Never Used  Vaping Use  . Vaping Use: Never used  Substance and Sexual Activity  . Alcohol use: Not Currently  . Drug use: Never  . Sexual activity: Not Currently  Other Topics Concern  . Not on file  Social History Narrative  . Not on file   Social Determinants of Health   Financial Resource Strain:   . Difficulty of Paying Living Expenses:   Food Insecurity:   . Worried About Programme researcher, broadcasting/film/video in the Last Year:   . Barista in the Last Year:   Transportation Needs:   . Freight forwarder (Medical):   Marland Kitchen Lack of Transportation (Non-Medical):   Physical Activity:   . Days of Exercise per Week:   . Minutes of Exercise per Session:   Stress:   . Feeling of Stress :   Social Connections:   . Frequency of Communication with Friends and Family:   . Frequency of Social Gatherings with Friends and Family:   . Attends Religious Services:   . Active Member of Clubs or Organizations:   . Attends Banker Meetings:   Marland Kitchen Marital Status:   Intimate Partner Violence:   . Fear of Current or Ex-Partner:   . Emotionally Abused:   Marland Kitchen Physically Abused:   . Sexually Abused:     Family History  Problem Relation Age of Onset  . Cancer Mother   . Dementia Mother     ROS: no fevers or chills, productive cough, hemoptysis, dysphasia, odynophagia, melena, hematochezia, dysuria, hematuria, rash, seizure activity, orthopnea, PND, pedal edema, claudication. Remaining systems are negative.  Physical Exam: Well-developed well-nourished in no acute distress.  Skin is warm and dry.  HEENT is normal.  Neck is supple.  Chest is clear to auscultation with normal expansion.  Cardiovascular exam is irregular Abdominal exam nontender or distended. No masses palpated. Extremities show no edema. neuro grossly  intact  A/P  1 permanent atrial fibrillation-continue Cardizem at present dose for rate control.  Continue Xarelto.    2 hypertension-blood pressure controlled.  Continue present medications.  3 hyperlipidemia-continue statin.  4 mitral valve prolapse with severe mitral regurgitation-given age she does not want to consider surgical procedures.  We therefore would not pursue follow-up echocardiograms.  She is in agreement.  5 lower extremity edema-continue Lasix but will decrease to every other day.  Follow clinically and take additional Lasix if needed.  Olga Millers, MD

## 2020-05-17 ENCOUNTER — Other Ambulatory Visit: Payer: Self-pay

## 2020-05-17 ENCOUNTER — Ambulatory Visit: Payer: Medicare Other | Admitting: Physical Therapy

## 2020-05-17 MED ORDER — RIVAROXABAN 15 MG PO TABS: 15.0000 mg | ORAL_TABLET | Freq: Every day | ORAL | 1 refills | Status: DC

## 2020-05-17 NOTE — Telephone Encounter (Addendum)
Spoke with pt's daughter who said Dr Jens Som manually calculated her dose at last visit and advised her she should be on 15mg  daily. Due to advanced age, low weight, and CrCl very close to this dose, will refill. Daughter did not want pt to change to Eliquis since she has been doing well on Xarelto for so long.

## 2020-05-17 NOTE — Telephone Encounter (Signed)
There is a pt advice request in the nl pool requesting a refill for 15mg  when the pt actually qualifies for a 20mg  xarelto dose. So the pt may need a dose change but they will require a reply on the pt advice request. Will route this to the pharmd pool for further review  69f 59kg 0.64scr ccr 52.2 Lovw/crenshaW 05/14/20

## 2020-05-18 ENCOUNTER — Encounter: Payer: Self-pay | Admitting: Orthopedic Surgery

## 2020-05-29 ENCOUNTER — Other Ambulatory Visit: Payer: Self-pay

## 2020-05-29 ENCOUNTER — Encounter: Payer: Self-pay | Admitting: Podiatry

## 2020-05-29 ENCOUNTER — Ambulatory Visit: Payer: Medicare Other | Admitting: Podiatry

## 2020-05-29 DIAGNOSIS — Z7901 Long term (current) use of anticoagulants: Secondary | ICD-10-CM

## 2020-05-29 DIAGNOSIS — M79675 Pain in left toe(s): Secondary | ICD-10-CM

## 2020-05-29 DIAGNOSIS — M79674 Pain in right toe(s): Secondary | ICD-10-CM

## 2020-05-29 DIAGNOSIS — B351 Tinea unguium: Secondary | ICD-10-CM

## 2020-06-03 NOTE — Progress Notes (Signed)
Subjective: 86 y.o. returns the office today for painful, elongated, thickened toenails which she cannot trim herself. Denies any redness or drainage around the nails. Denies any acute changes since last appointment and no new complaints today. Denies any systemic complaints such as fevers, chills, nausea, vomiting.   PCP: Pincus Sanes, MD  Objective: NAD- presents with caregiver DP/PT pulses palpable, CRT less than 3 seconds Nails hypertrophic, dystrophic, elongated, brittle, discolored 7. There is tenderness overlying the nails 2-5 on the left and 3-5 on the right. There is no significant toenail present on bilateral hallux or right 2nd digit. There is no surrounding erythema or drainage along the nail sites. No open lesions or pre-ulcerative lesions are identified. No other areas of tenderness bilateral lower extremities. No overlying edema, erythema, increased warmth. No pain with calf compression, swelling, warmth, erythema.  Assessment: Patient presents with symptomatic onychomycosis  Plan: -Treatment options including alternatives, risks, complications were discussed -Nails sharply debrided  7 without complication/bleeding. -Discussed daily foot inspection. If there are any changes, to call the office immediately.  -Follow-up in 3 months or sooner if any problems are to arise. In the meantime, encouraged to call the office with any questions, concerns, changes symptoms.  Ovid Curd, DPM

## 2020-06-12 NOTE — Progress Notes (Signed)
Subjective:    Patient ID: Grace Alvarado, female    DOB: 12-16-1924, 86 y.o.   MRN: [id number]  HPI The patient is here for follow up of their chronic medical problems, including htn, afib, hyperlipidemia, severe MR,  hypothyroidism, chronic low back pain, LE edema  She is taking all of her medications as prescribed.    taking lasix QOD.  Her leg swelling has been well controlled.  She feels tired in her legs.  She has not had her b 12 injections and thinks she may need it.  She is taking oral B complex.  She is doing much better since last time I saw her when she was in rehab.  Currently lives alone.  Her daughter is very helpful-she brings her her groceries and picks up her medications.  Her daughter is independent in her ADLs.  Her daughter will stay outside of the bathroom when she is showering, but she is able to do all that on her own.  She is able to prepare her own meals-most of these are frozen meals.  She does not do any cooking.  She is no longer driving.  She is able to manage her own medications and can manage most of her finances.  Her daughter does do some of her bills.  She has completed physical therapy.  She is using a walker or a cane in her apartment.  She will walk up and down the halls of her apartment building.  She typically only leaves her apartment to go to doctors appointments.  She is concerned and anxious about falling.  Medications and allergies reviewed with patient and updated if appropriate.  Patient Active Problem List   Diagnosis Date Noted  . Skin ulcer of sacrum, limited to breakdown of skin (HCC) 04/10/2020  . Foley catheter in place 04/10/2020  . Hyponatremia 03/07/2020  . DNR (do not resuscitate) 03/07/2020  . Prepatellar bursitis, left knee 03/02/2020  . Neuropathy 01/24/2020  . Chronic back pain 01/24/2020  . Complaints of leg weakness 01/24/2020  . Compression fracture of T11 vertebra (HCC) 01/23/2020  . Leg pain 01/03/2020  .  Chronic anticoagulation 01/03/2020  . Compression fracture of L1 lumbar vertebra (HCC) 07/27/2019  . Low back pain 07/20/2019  . Hyperglycemia 05/01/2019  . Neck pain, musculoskeletal 11/16/2018  . Constipation 11/16/2018  . Hypokalemia   . AVM (arteriovenous malformation) of colon   . Lower GI bleed 11/06/2018  . Iron deficiency anemia 11/02/2018  . Lymphedema of left lower extremity 10/22/2018  . B12 deficiency 05/27/2018  . Dependent edema 05/25/2018  . Pulmonary hypertension, primary (HCC) 05/20/2018  . Dyslipidemia 05/20/2018  . CHF (congestive heart failure) (HCC) 05/20/2018  . Tricuspid regurgitation 05/17/2018  . Mitral regurgitation 05/17/2018  . Chronic a-fib 05/17/2018  . Essential hypertension 05/17/2018  . Hypothyroid 05/17/2018  . CAD (coronary artery disease) 05/17/2018  . Carotid atherosclerosis, bilateral 05/17/2018    Current Outpatient Medications on File Prior to Visit  Medication Sig Dispense Refill  . acetaminophen (TYLENOL) 325 MG tablet Take 1-2 tablets (325-650 mg total) by mouth every 6 (six) hours as needed for mild pain (pain score 1-3 or temp > 100.5).    Marland Kitchen acidophilus (RISAQUAD) CAPS capsule Take 1 capsule by mouth daily.    . B Complex-C (B-COMPLEX WITH VITAMIN C) tablet Take 1 tablet by mouth daily.    Marland Kitchen diltiazem (CARDIZEM) 120 MG tablet Take 120 mg by mouth in the morning and at bedtime.    Marland Kitchen  docusate sodium (COLACE) 100 MG capsule Take 1 capsule (100 mg total) by mouth daily. Hold for loose bowel movements 60 capsule 0  . furosemide (LASIX) 20 MG tablet Take 1 tablet (20 mg total) by mouth every other day. 30 tablet   . Infant Care Products (DERMACLOUD) CREA Apply 1 application topically in the morning and at bedtime. Apply to buttocks.    Marland Kitchen levothyroxine (SYNTHROID) 75 MCG tablet TAKE 1 TABLET (75 MCG TOTAL) BY MOUTH DAILY BEFORE BREAKFAST. 90 tablet 1  . polyethylene glycol (MIRALAX) 17 g packet Take 17 g by mouth daily. Hold for loose bowel  movements 60 each 0  . potassium chloride (KLOR-CON) 10 MEQ tablet Take 1 tablet (10 mEq total) by mouth every other day.    . pravastatin (PRAVACHOL) 10 MG tablet TAKE 1 TABLET BY MOUTH EVERY DAY (Patient taking differently: Take 10 mg by mouth daily. ) 90 tablet 3  . Rivaroxaban (XARELTO) 15 MG TABS tablet Take 1 tablet (15 mg total) by mouth daily with supper. 90 tablet 1  . Vitamin D, Ergocalciferol, (DRISDOL) 1.25 MG (50000 UNIT) CAPS capsule Take 1 capsule (50,000 Units total) by mouth every 7 (seven) days. (Patient taking differently: Take 50,000 Units by mouth every 7 (seven) days. Thursday) 8 capsule 0  . [DISCONTINUED] metoprolol tartrate (LOPRESSOR) 25 MG tablet Take 0.5 tablets (12.5 mg total) by mouth 2 (two) times daily. (Patient not taking: Reported on 04/07/2020)     No current facility-administered medications on file prior to visit.    Past Medical History:  Diagnosis Date  . Atrial fibrillation (HCC)   . Hyperlipidemia   . Hypertension   . Hypothyroidism   . Mitral regurgitation   . MVP (mitral valve prolapse)     Past Surgical History:  Procedure Laterality Date  . APPENDECTOMY    . c section    . CESAREAN SECTION    . CHOLECYSTECTOMY    . COLONOSCOPY WITH PROPOFOL N/A 11/09/2018   Procedure: COLONOSCOPY WITH PROPOFOL;  Surgeon: Sherrilyn Rist, MD;  Location[facility] ENDOSCOPY;  Service: Gastroenterology;  Laterality: N/A;  . ESOPHAGOGASTRODUODENOSCOPY (EGD) WITH PROPOFOL N/A 11/09/2018   Procedure: ESOPHAGOGASTRODUODENOSCOPY (EGD) WITH PROPOFOL;  Surgeon: Sherrilyn Rist, MD;  Location: [facility] ENDOSCOPY;  Service: Gastroenterology;  Laterality: N/A;  . HOT HEMOSTASIS N/A 11/09/2018   Procedure: HOT HEMOSTASIS (ARGON PLASMA COAGULATION/BICAP);  Surgeon: Sherrilyn Rist, MD;  Location: [facility] ENDOSCOPY;  Service: Gastroenterology;  Laterality: N/A;  . I & D EXTREMITY Left 03/02/2020   Procedure: evacuation of left knee hematoma, possible wound vac placement;  Surgeon: Cammy Copa, MD;  Location: [facility] OR;  Service: Orthopedics;  Laterality: Left;  . SUBMUCOSAL INJECTION  11/09/2018   Procedure: SUBMUCOSAL INJECTION;  Surgeon: Sherrilyn Rist, MD;  Location: [facility] ENDOSCOPY;  Service: Gastroenterology;;    Social History   Socioeconomic History  . Marital status: Widowed    Spouse name: Not on file  . Number of children: 3  . Years of education: Not on file  . Highest education level: Not on file  Occupational History  . Occupation: retired  Tobacco Use  . Smoking status: Former Games developer  . Smokeless tobacco: Never Used  Vaping Use  . Vaping Use: Never used  Substance and Sexual Activity  . Alcohol use: Not Currently  . Drug use: Never  . Sexual activity: Not Currently  Other Topics Concern  . Not on file  Social History Narrative  . Not on  file   Social Determinants of Health   Financial Resource Strain:   . Difficulty of Paying Living Expenses: Not on file  Food Insecurity:   . Worried About Programme researcher, broadcasting/film/video in the Last Year: Not on file  . Ran Out of Food in the Last Year: Not on file  Transportation Needs:   . Lack of Transportation (Medical): Not on file  . Lack of Transportation (Non-Medical): Not on file  Physical Activity:   . Days of Exercise per Week: Not on file  . Minutes of Exercise per Session: Not on file  Stress:   . Feeling of Stress : Not on file  Social Connections:   . Frequency of Communication with Friends and Family: Not on file  . Frequency of Social Gatherings with Friends and Family: Not on file  . Attends Religious Services: Not on file  . Active Member of Clubs or Organizations: Not on file  . Attends Banker Meetings: Not on file  . Marital Status: Not on file    Family History  Problem Relation Age of Onset  . Cancer Mother   . Dementia Mother     Review of Systems  Constitutional: Negative for fever.  HENT: Positive for postnasal drip.   Respiratory: Positive for cough (occ).  Negative for shortness of breath and wheezing.   Cardiovascular: Positive for leg swelling. Negative for chest pain and palpitations.  Musculoskeletal: Positive for arthralgias (knee, left), back pain and neck pain.  Neurological: Positive for headaches (occ). Negative for dizziness and light-headedness.  Psychiatric/Behavioral: Negative for dysphoric mood. The patient is nervous/anxious (occ).        Objective:   Vitals:   06/13/20 1015  BP: 118/70  Pulse: 95  Temp: 98.3 F (36.8 C)  SpO2: 96%   BP Readings from Last 3 Encounters:  06/13/20 118/70  05/14/20 126/70  04/10/20 110/62   Wt Readings from Last 3 Encounters:  06/13/20 132 lb 6.4 oz (60.1 kg)  05/14/20 130 lb (59 kg)  04/07/20 132 lb 15 oz (60.3 kg)   Body mass index is 23.45 kg/m.   Physical Exam    Constitutional: Appears well-developed and well-nourished. No distress.  HENT:  Head: Normocephalic and atraumatic.  Neck: Neck supple. No tracheal deviation present. No thyromegaly present.  No cervical lymphadenopathy Cardiovascular: Normal rate, irregular rhythm and normal heart sounds.   3/6 systolic murmur heard. No carotid bruit .  Trace edema right lower extremity, mild edema left lower extremity  Pulmonary/Chest: Effort normal and breath sounds normal. No respiratory distress. No has no wheezes. No rales.  Skin: Skin is warm and dry. Not diaphoretic.  Psychiatric: Normal mood and affect. Behavior is normal.      Assessment & Plan:    See Problem List for Assessment and Plan of chronic medical problems.    This visit occurred during the SARS-CoV-2 public health emergency.  Safety protocols were in place, including screening questions prior to the visit, additional usage of staff PPE, and extensive cleaning of exam room while observing appropriate contact time as indicated for disinfecting solutions.

## 2020-06-13 ENCOUNTER — Other Ambulatory Visit: Payer: Self-pay

## 2020-06-13 ENCOUNTER — Ambulatory Visit (INDEPENDENT_AMBULATORY_CARE_PROVIDER_SITE_OTHER): Payer: Medicare Other | Admitting: Internal Medicine

## 2020-06-13 ENCOUNTER — Encounter: Payer: Self-pay | Admitting: Internal Medicine

## 2020-06-13 VITALS — BP 118/70 | HR 95 | Temp 98.3°F | Wt 132.4 lb

## 2020-06-13 DIAGNOSIS — E785 Hyperlipidemia, unspecified: Secondary | ICD-10-CM

## 2020-06-13 DIAGNOSIS — I482 Chronic atrial fibrillation, unspecified: Secondary | ICD-10-CM | POA: Diagnosis not present

## 2020-06-13 DIAGNOSIS — E038 Other specified hypothyroidism: Secondary | ICD-10-CM

## 2020-06-13 DIAGNOSIS — I10 Essential (primary) hypertension: Secondary | ICD-10-CM

## 2020-06-13 DIAGNOSIS — R609 Edema, unspecified: Secondary | ICD-10-CM

## 2020-06-13 DIAGNOSIS — E538 Deficiency of other specified B group vitamins: Secondary | ICD-10-CM

## 2020-06-13 DIAGNOSIS — I700 Atherosclerosis of aorta: Secondary | ICD-10-CM | POA: Insufficient documentation

## 2020-06-13 MED ORDER — CYANOCOBALAMIN 1000 MCG/ML IJ SOLN
1000.0000 ug | Freq: Once | INTRAMUSCULAR | Status: AC
  Administered 2020-06-13: 1000 ug via INTRAMUSCULAR

## 2020-06-13 NOTE — Assessment & Plan Note (Signed)
Chronic Rate controlled Following with cardiology Asymptomatic On Xarelto-no concerns with abnormal bruising or bleeding On Cardizem

## 2020-06-13 NOTE — Patient Instructions (Addendum)
  You had a B12 injection today and monthly.     Medications reviewed and updated.  Changes include :   none    Please followup in 6 months

## 2020-06-13 NOTE — Assessment & Plan Note (Signed)
Chronic Taking furosemide every other day Swelling is well controlled Continue furosemide every other day

## 2020-06-13 NOTE — Assessment & Plan Note (Signed)
Chronic BP well controlled Current regimen effective and well tolerated Continue current medications at current doses  

## 2020-06-13 NOTE — Assessment & Plan Note (Addendum)
Chronic Continue daily statin-pravastatin Regular exercise and healthy diet encouraged

## 2020-06-13 NOTE — Assessment & Plan Note (Signed)
Chronic Has no known carotid atherosclerosis, CAD Taking pravastatin low-dose daily She is exercising on a regular basis Overall she eats very healthy, but is limited due to her age and inability to cook Continue above

## 2020-06-13 NOTE — Assessment & Plan Note (Signed)
Lab Results  Component Value Date   TSH 2.66 12/06/2019   Continue current dose of levothyroxine

## 2020-06-13 NOTE — Assessment & Plan Note (Signed)
Chronic Taking oral B complex Was on injections and then better with them-restart monthly injections-first today and then monthly

## 2020-06-18 ENCOUNTER — Other Ambulatory Visit: Payer: Self-pay

## 2020-06-18 ENCOUNTER — Ambulatory Visit (INDEPENDENT_AMBULATORY_CARE_PROVIDER_SITE_OTHER): Payer: Medicare Other | Admitting: Physical Therapy

## 2020-06-18 ENCOUNTER — Encounter: Payer: Self-pay | Admitting: Physical Therapy

## 2020-06-18 DIAGNOSIS — M6281 Muscle weakness (generalized): Secondary | ICD-10-CM | POA: Diagnosis not present

## 2020-06-18 DIAGNOSIS — R2689 Other abnormalities of gait and mobility: Secondary | ICD-10-CM | POA: Diagnosis not present

## 2020-06-20 ENCOUNTER — Encounter: Payer: Self-pay | Admitting: Physical Therapy

## 2020-06-20 ENCOUNTER — Other Ambulatory Visit: Payer: Self-pay

## 2020-06-20 ENCOUNTER — Ambulatory Visit (INDEPENDENT_AMBULATORY_CARE_PROVIDER_SITE_OTHER): Payer: Medicare Other | Admitting: Physical Therapy

## 2020-06-20 DIAGNOSIS — M6281 Muscle weakness (generalized): Secondary | ICD-10-CM

## 2020-06-20 DIAGNOSIS — R2689 Other abnormalities of gait and mobility: Secondary | ICD-10-CM | POA: Diagnosis not present

## 2020-06-20 NOTE — Therapy (Signed)
[facility] [address] [place], [place], [address] Phone: [phone]   Fax:  [phone]  Physical Therapy Treatment  Patient Details  Name: Grace Alvarado MRN: [id number] Date of Birth: 1925/06/08 Referring Provider (PT): Terrilee Files   Encounter Date: 06/20/2020   PT End of Session - 06/20/20 1208    Visit Number 2    Number of Visits 12    Date for PT Re-Evaluation 07/30/20    Authorization Type U[employer]    PT Start Time 0802    PT Stop Time 0847    PT Time Calculation (min) 45 min    Equipment Utilized During Treatment Gait belt    Activity Tolerance Patient tolerated treatment well    Behavior During Therapy [facility] for tasks assessed/performed           Past Medical History:  Diagnosis Date  . Atrial fibrillation (HCC)   . Hyperlipidemia   . Hypertension   . Hypothyroidism   . Mitral regurgitation   . MVP (mitral valve prolapse)     Past Surgical History:  Procedure Laterality Date  . APPENDECTOMY    . c section    . CESAREAN SECTION    . CHOLECYSTECTOMY    . COLONOSCOPY WITH PROPOFOL N/A 11/09/2018   Procedure: COLONOSCOPY WITH PROPOFOL;  Surgeon: Sherrilyn Rist, MD;  Location: [facility] ENDOSCOPY;  Service: Gastroenterology;  Laterality: N/A;  . ESOPHAGOGASTRODUODENOSCOPY (EGD) WITH PROPOFOL N/A 11/09/2018   Procedure: ESOPHAGOGASTRODUODENOSCOPY (EGD) WITH PROPOFOL;  Surgeon: Sherrilyn Rist, MD;  Location: [facility] ENDOSCOPY;  Service: Gastroenterology;  Laterality: N/A;  . HOT HEMOSTASIS N/A 11/09/2018   Procedure: HOT HEMOSTASIS (ARGON PLASMA COAGULATION/BICAP);  Surgeon: Sherrilyn Rist, MD;  Location: [facility] ENDOSCOPY;  Service: Gastroenterology;  Laterality: N/A;  . I & D EXTREMITY Left 03/02/2020   Procedure: evacuation of left knee hematoma, possible wound vac placement;  Surgeon: Cammy Copa, MD;  Location: [facility] OR;  Service: Orthopedics;  Laterality: Left;  . SUBMUCOSAL INJECTION  11/09/2018   Procedure: SUBMUCOSAL  INJECTION;  Surgeon: Sherrilyn Rist, MD;  Location: [facility] ENDOSCOPY;  Service: Gastroenterology;;    There were no vitals filed for this visit.   Subjective Assessment - 06/20/20 1207    Subjective No new complaints, likes new height of walker. ALso brought 2 WW and cane today to try                             [facility] Adult PT Treatment/Exercise - 06/20/20 1210      Ambulation/Gait   Gait Comments 35 ft x 6 with RW, x4 with SPC.       Exercises   Exercises Knee/Hip      Knee/Hip Exercises: Aerobic   Recumbent Bike L1 x 4 min, 2 rests.       Knee/Hip Exercises: Standing   Heel Raises 20 reps    Hip Flexion 20 reps;Knee bent    Hip Abduction 2 sets;10 reps;Both      Knee/Hip Exercises: Seated   Long Arc Quad 20 reps    Other Seated Knee/Hip Exercises Rows YTB x 20;     Hamstring Curl 15 reps    Hamstring Limitations RTB R     Sit to Sand 10 reps                  PT Education - 06/20/20 1208    Education Details INitial HEP  Person(s) Educated Patient    Methods Explanation;Demonstration;Tactile cues;Verbal cues;Handout    Comprehension Verbalized understanding;Returned demonstration;Verbal cues required;Need further instruction;Tactile cues required            PT Short Term Goals - 06/20/20 1049      PT SHORT TERM GOAL #1   Title Pt to be independent with initial HEP    Time 2    Period Weeks    Status New    Target Date 07/02/20      PT SHORT TERM GOAL #2   Title Pt will demo optimal posture with use of RW , with improved fwd flexion, and improved shoulder position    Time 2    Period Weeks    Status New    Target Date 07/02/20             PT Long Term Goals - 06/20/20 1050      PT LONG TERM GOAL #1   Title Pt to be independent with final HEP    Time 6    Period Weeks    Status New    Target Date 07/30/20      PT LONG TERM GOAL #2   Title Pt to be independent/safe with ambulation up to 500 ft with LRAD.    Time 6     Period Weeks    Status New    Target Date 07/30/20      PT LONG TERM GOAL #3   Title Pt to be independent/safe for navigation of at least 5 steps with 1 HR ,recipricol pattern for improved independence in community    Time 6    Period Weeks    Status New    Target Date 07/30/20      PT LONG TERM GOAL #4   Title Pt to demo dynamic balance to be WNL for pt age, to improve safety with home and community navigation/ Improve BERG by at least 5 points    Time 6    Period Weeks    Status New    Target Date 07/30/20      PT LONG TERM GOAL #5   Title Pt to demo improved strength of bil LEs to be at least 4+/5 for improved stability and ambulation    Time 6    Period Weeks    Status New    Target Date 07/30/20                 Plan - 06/20/20 1209    Clinical Impression Statement Tried RW, pt safe for use of 4WW or 2 [employer]. Recommend pt continue use of walkers vs cane for safety. Did practice sequencing for cane today, pt will require further practice. Educated on initial HEP for strengthening.    Personal Factors and Comorbidities Age    Examination-Activity Limitations Locomotion Level;Squat;Stairs;Stand;Lift    Examination-Participation Restrictions Meal Prep;Cleaning;Community Activity    Stability/Clinical Decision Making Stable/Uncomplicated    Rehab Potential Good    PT Frequency 2x / week    PT Duration 6 weeks    PT Treatment/Interventions ADLs/Self Care Home Management;Cryotherapy;Electrical Stimulation;Gait training;DME Instruction;Ultrasound;Moist Heat;Iontophoresis 4mg /ml Dexamethasone;Stair training;Functional mobility training;Therapeutic activities;Therapeutic exercise;Neuromuscular re-education;Balance training;Manual techniques;Orthotic Fit/Training;Patient/family education;Passive range of motion;Dry needling;Taping;Spinal Manipulations;Energy conservation    Consulted and Agree with Plan of Care Patient           Patient will benefit from skilled therapeutic  intervention in order to improve the following deficits and impairments:  Abnormal gait, Decreased range of motion, Decreased  endurance, Decreased safety awareness, Pain, Decreased activity tolerance, Decreased balance, Decreased knowledge of use of DME, Impaired flexibility, Improper body mechanics, Decreased mobility, Decreased strength  Visit Diagnosis: Other abnormalities of gait and mobility  Muscle weakness (generalized)     Problem List Patient Active Problem List   Diagnosis Date Noted  . Aortic atherosclerosis (HCC) 06/13/2020  . Hyponatremia 03/07/2020  . DNR (do not resuscitate) 03/07/2020  . Prepatellar bursitis, left knee 03/02/2020  . Neuropathy 01/24/2020  . Chronic back pain 01/24/2020  . Complaints of leg weakness 01/24/2020  . Compression fracture of T11 vertebra (HCC) 01/23/2020  . Leg pain 01/03/2020  . Chronic anticoagulation 01/03/2020  . Compression fracture of L1 lumbar vertebra (HCC) 07/27/2019  . Low back pain 07/20/2019  . Hyperglycemia 05/01/2019  . Neck pain, musculoskeletal 11/16/2018  . Constipation 11/16/2018  . Hypokalemia   . AVM (arteriovenous malformation) of colon   . Lower GI bleed 11/06/2018  . Iron deficiency anemia 11/02/2018  . Lymphedema of left lower extremity 10/22/2018  . B12 deficiency 05/27/2018  . Dependent edema 05/25/2018  . Pulmonary hypertension, primary (HCC) 05/20/2018  . Dyslipidemia 05/20/2018  . CHF (congestive heart failure) (HCC) 05/20/2018  . Tricuspid regurgitation 05/17/2018  . Mitral regurgitation 05/17/2018  . Chronic a-fib 05/17/2018  . Essential hypertension 05/17/2018  . Hypothyroid 05/17/2018  . CAD (coronary artery disease) 05/17/2018  . Carotid atherosclerosis, bilateral 05/17/2018   Sedalia Muta, PT, DPT 12:12 PM  06/20/20    [facility] [address] [place], [place], [address] Phone: [phone]   Fax:  [phone]  Name: Grace Alvarado MRN:  [id number] Date of Birth: 02-24-1925

## 2020-06-20 NOTE — Patient Instructions (Signed)
Access Code: [email or web address] URL: [email or web address] Date: 06/20/2020 Prepared by: Sedalia Muta  Exercises Heel rises with counter support - 1 x daily - 2 sets - 10 reps Standing March with Counter Support - 1 x daily - 2 sets - 10 reps Standing Hip Abduction with Counter Support - 1 x daily - 2 sets - 10 reps Sit to Stand - 1-2 x daily - 1 sets - 10 reps Seated Scapular Retraction - 2 x daily - 1 sets - 10 reps

## 2020-06-20 NOTE — Therapy (Signed)
[facility] [address] [place], [place], [address] Phone: [phone]   Fax:  [phone]  Physical Therapy Evaluation  Patient Details  Name: Grace Alvarado MRN: [id number] Date of Birth: 1925-10-02 Referring Provider (PT): Terrilee Files   Encounter Date: 06/18/2020   PT End of Session - 06/20/20 1045    Visit Number 1    Number of Visits 12    Date for PT Re-Evaluation 07/30/20    Authorization Type UHC    PT Start Time 1220    PT Stop Time 1300    PT Time Calculation (min) 40 min    Equipment Utilized During Treatment Gait belt    Activity Tolerance Patient tolerated treatment well    Behavior During Therapy [facility] for tasks assessed/performed           Past Medical History:  Diagnosis Date  . Atrial fibrillation (HCC)   . Hyperlipidemia   . Hypertension   . Hypothyroidism   . Mitral regurgitation   . MVP (mitral valve prolapse)     Past Surgical History:  Procedure Laterality Date  . APPENDECTOMY    . c section    . CESAREAN SECTION    . CHOLECYSTECTOMY    . COLONOSCOPY WITH PROPOFOL N/A 11/09/2018   Procedure: COLONOSCOPY WITH PROPOFOL;  Surgeon: Sherrilyn Rist, MD;  Location: [facility] ENDOSCOPY;  Service: Gastroenterology;  Laterality: N/A;  . ESOPHAGOGASTRODUODENOSCOPY (EGD) WITH PROPOFOL N/A 11/09/2018   Procedure: ESOPHAGOGASTRODUODENOSCOPY (EGD) WITH PROPOFOL;  Surgeon: Sherrilyn Rist, MD;  Location: [facility] ENDOSCOPY;  Service: Gastroenterology;  Laterality: N/A;  . HOT HEMOSTASIS N/A 11/09/2018   Procedure: HOT HEMOSTASIS (ARGON PLASMA COAGULATION/BICAP);  Surgeon: Sherrilyn Rist, MD;  Location: [facility] ENDOSCOPY;  Service: Gastroenterology;  Laterality: N/A;  . I & D EXTREMITY Left 03/02/2020   Procedure: evacuation of left knee hematoma, possible wound vac placement;  Surgeon: Cammy Copa, MD;  Location: [facility] OR;  Service: Orthopedics;  Laterality: Left;  . SUBMUCOSAL INJECTION  11/09/2018   Procedure: SUBMUCOSAL  INJECTION;  Surgeon: Sherrilyn Rist, MD;  Location: [facility] ENDOSCOPY;  Service: Gastroenterology;;    There were no vitals filed for this visit.    Subjective Assessment - 06/20/20 1041    Subjective Pt had fall 02/22/20, hurt her knee, had a cut that sounds like it got infected, had to have i&d.  Pt required stay at hospital, then at rehab center, due to mobility decline. Now home, doing much better, did have home health. wants to get stronger, and improve balance. Lives alone in apartment. Has 4 WW and 2 RW.  Has supervision for shower. Independent ADLs.  No stairs. Daugher lives close by, helps pt, daughter here w pt todya for appt.    Pertinent History A-fib,  Previous stress fx in back, OP    Limitations Standing;Walking;House hold activities    Patient Stated Goals Improved balance,    Currently in Pain? Yes    Pain Score 4     Pain Location Back    Pain Orientation Right;Left    Pain Descriptors / Indicators Aching    Pain Type Chronic pain    Pain Onset More than a month ago    Pain Frequency Intermittent    Pain Score 3    Pain Location Knee    Pain Orientation Left    Pain Descriptors / Indicators Aching    Pain Type Acute pain    Pain Onset More than a month  ago    Pain Frequency Intermittent              [facility] PT Assessment - 06/20/20 0001      Assessment   Medical Diagnosis Balance, gait    Referring Provider (PT) Terrilee FilesZach Smith    Prior Therapy home care       Home Environment   Additional Comments lives in apartment, alone, daughter lives 10 min away, pt has life alert, daughter supervises showers, pt independent with heating up meals and basic ADLS.       Prior Function   Level of Independence Independent with basic ADLs;Independent with household mobility with device;Needs assistance with homemaking      Cognition   Overall Cognitive Status Within Functional Limits for tasks assessed      AROM   Overall AROM Comments L knee: WFL, mild hesitation for ffull  flexion       Strength   Overall Strength Comments hips: 4-/5, R KNee: 4/5,  L Knee: 4-/5       Ambulation/Gait   Gait Comments 35 ft x 6 with4WW, poor rounded shoulder/flexed posture,       Standardized Balance Assessment   Standardized Balance Assessment Berg Balance Test      Berg Balance Test   Sit to Stand Able to stand without using hands and stabilize independently    Standing Unsupported Able to stand safely 2 minutes    Sitting with Back Unsupported but Feet Supported on Floor or Stool Able to sit safely and securely 2 minutes    Stand to Sit Controls descent by using hands    Transfers Able to transfer safely, definite need of hands    Standing Unsupported with Eyes Closed Able to stand 10 seconds with supervision    Standing Unsupported with Feet Together Able to place feet together independently but unable to hold for 30 seconds    From Standing, Reach Forward with Outstretched Arm Can reach confidently >25 cm (10")    From Standing Position, Pick up Object from Floor Able to pick up shoe safely and easily    From Standing Position, Turn to Look Behind Over each Shoulder Looks behind one side only/other side shows less weight shift    Turn 360 Degrees Able to turn 360 degrees safely one side only in 4 seconds or less    Standing Unsupported, Alternately Place Feet on Step/Stool Able to stand independently and complete 8 steps >20 seconds    Standing Unsupported, One Foot in Front Needs help to step but can hold 15 seconds    Standing on One Leg Able to lift leg independently and hold equal to or more than 3 seconds    Total Score 43                      Objective measurements completed on examination: See above findings.               PT Education - 06/20/20 1045    Education Details Educated on mechanics for using RW, height of walker increased for posture.    Person(s) Educated Patient    Methods Explanation;Demonstration;Verbal cues     Comprehension Verbalized understanding;Returned demonstration;Verbal cues required            PT Short Term Goals - 06/20/20 1049      PT SHORT TERM GOAL #1   Title Pt to be independent with initial HEP    Time 2  Period Weeks    Status New    Target Date 07/02/20      PT SHORT TERM GOAL #2   Title Pt will demo optimal posture with use of RW , with improved fwd flexion, and improved shoulder position    Time 2    Period Weeks    Status New    Target Date 07/02/20             PT Long Term Goals - 06/20/20 1050      PT LONG TERM GOAL #1   Title Pt to be independent with final HEP    Time 6    Period Weeks    Status New    Target Date 07/30/20      PT LONG TERM GOAL #2   Title Pt to be independent/safe with ambulation up to 500 ft with LRAD.    Time 6    Period Weeks    Status New    Target Date 07/30/20      PT LONG TERM GOAL #3   Title Pt to be independent/safe for navigation of at least 5 steps with 1 HR ,recipricol pattern for improved independence in community    Time 6    Period Weeks    Status New    Target Date 07/30/20      PT LONG TERM GOAL #4   Title Pt to demo dynamic balance to be WNL for pt age, to improve safety with home and community navigation/ Improve BERG by at least 5 points    Time 6    Period Weeks    Status New    Target Date 07/30/20      PT LONG TERM GOAL #5   Title Pt to demo improved strength of bil LEs to be at least 4+/5 for improved stability and ambulation    Time 6    Period Weeks    Status New    Target Date 07/30/20                  Plan - 06/20/20 1053    Clinical Impression Statement Pt presents with primary complaint of weakness and balance, from long stay in hospital and rehab. Pt with decreased strength of LEs, and decreased endurance for functional activity. She is relying on RW for support for ambulation and balance. Pt with decreased dynamic balance, stairs, and community navigation. Pt to benefit  from skilled care to improve deficits.    Personal Factors and Comorbidities Age    Examination-Activity Limitations Locomotion Level;Squat;Stairs;Stand;Lift    Examination-Participation Restrictions Meal Prep;Cleaning;Community Activity    Stability/Clinical Decision Making Stable/Uncomplicated    Clinical Decision Making Low    Rehab Potential Good    PT Frequency 2x / week    PT Duration 6 weeks    PT Treatment/Interventions ADLs/Self Care Home Management;Cryotherapy;Electrical Stimulation;Gait training;DME Instruction;Ultrasound;Moist Heat;Iontophoresis 4mg /ml Dexamethasone;Stair training;Functional mobility training;Therapeutic activities;Therapeutic exercise;Neuromuscular re-education;Balance training;Manual techniques;Orthotic Fit/Training;Patient/family education;Passive range of motion;Dry needling;Taping;Spinal Manipulations;Energy conservation    Consulted and Agree with Plan of Care Patient           Patient will benefit from skilled therapeutic intervention in order to improve the following deficits and impairments:  Abnormal gait, Decreased range of motion, Decreased endurance, Decreased safety awareness, Pain, Decreased activity tolerance, Decreased balance, Decreased knowledge of use of DME, Impaired flexibility, Improper body mechanics, Decreased mobility, Decreased strength  Visit Diagnosis: Other abnormalities of gait and mobility  Muscle weakness (generalized)     Problem  List Patient Active Problem List   Diagnosis Date Noted  . Aortic atherosclerosis (HCC) 06/13/2020  . Hyponatremia 03/07/2020  . DNR (do not resuscitate) 03/07/2020  . Prepatellar bursitis, left knee 03/02/2020  . Neuropathy 01/24/2020  . Chronic back pain 01/24/2020  . Complaints of leg weakness 01/24/2020  . Compression fracture of T11 vertebra (HCC) 01/23/2020  . Leg pain 01/03/2020  . Chronic anticoagulation 01/03/2020  . Compression fracture of L1 lumbar vertebra (HCC) 07/27/2019  .  Low back pain 07/20/2019  . Hyperglycemia 05/01/2019  . Neck pain, musculoskeletal 11/16/2018  . Constipation 11/16/2018  . Hypokalemia   . AVM (arteriovenous malformation) of colon   . Lower GI bleed 11/06/2018  . Iron deficiency anemia 11/02/2018  . Lymphedema of left lower extremity 10/22/2018  . B12 deficiency 05/27/2018  . Dependent edema 05/25/2018  . Pulmonary hypertension, primary (HCC) 05/20/2018  . Dyslipidemia 05/20/2018  . CHF (congestive heart failure) (HCC) 05/20/2018  . Tricuspid regurgitation 05/17/2018  . Mitral regurgitation 05/17/2018  . Chronic a-fib 05/17/2018  . Essential hypertension 05/17/2018  . Hypothyroid 05/17/2018  . CAD (coronary artery disease) 05/17/2018  . Carotid atherosclerosis, bilateral 05/17/2018    Sedalia Muta, PT, DPT 12:06 PM  06/20/20    [facility] [address] [place], [place], [address] Phone: [phone]   Fax:  [phone]  Name: Rosselyn Martha MRN: [id number] Date of Birth: 1925-01-24

## 2020-06-26 ENCOUNTER — Other Ambulatory Visit: Payer: Self-pay

## 2020-06-26 ENCOUNTER — Encounter: Payer: Self-pay | Admitting: Physical Therapy

## 2020-06-26 ENCOUNTER — Ambulatory Visit (INDEPENDENT_AMBULATORY_CARE_PROVIDER_SITE_OTHER): Payer: Medicare Other | Admitting: Physical Therapy

## 2020-06-26 DIAGNOSIS — R2689 Other abnormalities of gait and mobility: Secondary | ICD-10-CM | POA: Diagnosis not present

## 2020-06-26 DIAGNOSIS — M6281 Muscle weakness (generalized): Secondary | ICD-10-CM | POA: Diagnosis not present

## 2020-06-26 NOTE — Therapy (Signed)
[facility] [address] [place], [place], [address] Phone: [phone]   Fax:  [phone]  Physical Therapy Treatment  Patient Details  Name: Grace Alvarado MRN: [id number] Date of Birth: 07-09-1925 Referring Provider (PT): Terrilee Files   Encounter Date: 06/26/2020   PT End of Session - 06/26/20 0814    Visit Number 3    Number of Visits 12    Date for PT Re-Evaluation 07/30/20    Authorization Type UHC    PT Start Time 0805    PT Stop Time 0845    PT Time Calculation (min) 40 min    Equipment Utilized During Treatment Gait belt    Activity Tolerance Patient tolerated treatment well    Behavior During Therapy WFL for tasks assessed/performed           Past Medical History:  Diagnosis Date   Atrial fibrillation (HCC)    Hyperlipidemia    Hypertension    Hypothyroidism    Mitral regurgitation    MVP (mitral valve prolapse)     Past Surgical History:  Procedure Laterality Date   APPENDECTOMY     c section     CESAREAN SECTION     CHOLECYSTECTOMY     COLONOSCOPY WITH PROPOFOL N/A 11/09/2018   Procedure: COLONOSCOPY WITH PROPOFOL;  Surgeon: Sherrilyn Rist, MD;  Location: [facility] ENDOSCOPY;  Service: Gastroenterology;  Laterality: N/A;   ESOPHAGOGASTRODUODENOSCOPY (EGD) WITH PROPOFOL N/A 11/09/2018   Procedure: ESOPHAGOGASTRODUODENOSCOPY (EGD) WITH PROPOFOL;  Surgeon: Sherrilyn Rist, MD;  Location[facility] ENDOSCOPY;  Service: Gastroenterology;  Laterality: N/A;   HOT HEMOSTASIS N/A 11/09/2018   Procedure: HOT HEMOSTASIS (ARGON PLASMA COAGULATION/BICAP);  Surgeon: Sherrilyn Rist, MD;  Location: [facility] ENDOSCOPY;  Service: Gastroenterology;  Laterality: N/A;   I & D EXTREMITY Left 03/02/2020   Procedure: evacuation of left knee hematoma, possible wound vac placement;  Surgeon: Cammy Copa, MD;  Location: [facility] OR;  Service: Orthopedics;  Laterality: Left;   SUBMUCOSAL INJECTION  11/09/2018   Procedure: SUBMUCOSAL  INJECTION;  Surgeon: Sherrilyn Rist, MD;  Location: [facility] ENDOSCOPY;  Service: Gastroenterology;;    There were no vitals filed for this visit.   Subjective Assessment - 06/26/20 0813    Subjective Pt has continued to use walker at home. No new complaints.    Currently in Pain? Yes    Pain Score 3     Pain Location Back    Pain Orientation Right;Left    Pain Descriptors / Indicators Aching    Pain Type Chronic pain    Pain Onset More than a month ago    Pain Frequency Intermittent    Pain Score 0    Pain Location Knee    Pain Orientation Left    Pain Descriptors / Indicators Aching    Pain Type Acute pain    Pain Onset More than a month ago    Pain Frequency Intermittent                             [facility] Adult PT Treatment/Exercise - 06/26/20 0001      Ambulation/Gait   Gait Comments --      Exercises   Exercises Knee/Hip      Knee/Hip Exercises: Aerobic   Recumbent Bike L1 x 4 min, 1 rests.       Knee/Hip Exercises: Standing   Heel Raises 20 reps    Hip Flexion --  Hip Abduction 2 sets;10 reps;Both    Forward Step Up 10 reps;Both;Step Height: 6";Hand Hold: 2    Stairs up/down 5 steps 1 HR, x5.     Other Standing Knee Exercises side stepping 15 ft x 4;      Other Standing Knee Exercises L/R and staggered stance weight shifting x 20 ea;  Lateral and fwd/bwd stepping with weight shifts x 20 each bil;       Knee/Hip Exercises: Seated   Long Arc Quad 20 reps    Long Arc Quad Limitations 1.5 lb    Other Seated Knee/Hip Exercises Rows YTB x 20;     Hamstring Curl 15 reps    Hamstring Limitations RTB    Sit to [employer] 10 reps                    PT Short Term Goals - 06/20/20 1049      PT SHORT TERM GOAL #1   Title Pt to be independent with initial HEP    Time 2    Period Weeks    Status New    Target Date 07/02/20      PT SHORT TERM GOAL #2   Title Pt will demo optimal posture with use of RW , with improved fwd flexion, and  improved shoulder position    Time 2    Period Weeks    Status New    Target Date 07/02/20             PT Long Term Goals - 06/20/20 1050      PT LONG TERM GOAL #1   Title Pt to be independent with final HEP    Time 6    Period Weeks    Status New    Target Date 07/30/20      PT LONG TERM GOAL #2   Title Pt to be independent/safe with ambulation up to 500 ft with LRAD.    Time 6    Period Weeks    Status New    Target Date 07/30/20      PT LONG TERM GOAL #3   Title Pt to be independent/safe for navigation of at least 5 steps with 1 HR ,recipricol pattern for improved independence in community    Time 6    Period Weeks    Status New    Target Date 07/30/20      PT LONG TERM GOAL #4   Title Pt to demo dynamic balance to be WNL for pt age, to improve safety with home and community navigation/ Improve BERG by at least 5 points    Time 6    Period Weeks    Status New    Target Date 07/30/20      PT LONG TERM GOAL #5   Title Pt to demo improved strength of bil LEs to be at least 4+/5 for improved stability and ambulation    Time 6    Period Weeks    Status New    Target Date 07/30/20                 Plan - 06/26/20 1638    Clinical Impression Statement Progressed ther ex and balance exercises today, pt did well, minimal lob. Plan to progress as tolerated.    Personal Factors and Comorbidities Age    Examination-Activity Limitations Locomotion Level;Squat;Stairs;Stand;Lift    Examination-Participation Restrictions Meal Prep;Cleaning;Community Activity    Stability/Clinical Decision Making Stable/Uncomplicated    Rehab Potential  Good    PT Frequency 2x / week    PT Duration 6 weeks    PT Treatment/Interventions ADLs/Self Care Home Management;Cryotherapy;Electrical Stimulation;Gait training;DME Instruction;Ultrasound;Moist Heat;Iontophoresis 4mg /ml Dexamethasone;Stair training;Functional mobility training;Therapeutic activities;Therapeutic  exercise;Neuromuscular re-education;Balance training;Manual techniques;Orthotic Fit/Training;Patient/family education;Passive range of motion;Dry needling;Taping;Spinal Manipulations;Energy conservation    Consulted and Agree with Plan of Care Patient           Patient will benefit from skilled therapeutic intervention in order to improve the following deficits and impairments:  Abnormal gait, Decreased range of motion, Decreased endurance, Decreased safety awareness, Pain, Decreased activity tolerance, Decreased balance, Decreased knowledge of use of DME, Impaired flexibility, Improper body mechanics, Decreased mobility, Decreased strength  Visit Diagnosis: Other abnormalities of gait and mobility  Muscle weakness (generalized)     Problem List Patient Active Problem List   Diagnosis Date Noted   Aortic atherosclerosis (HCC) 06/13/2020   Hyponatremia 03/07/2020   DNR (do not resuscitate) 03/07/2020   Prepatellar bursitis, left knee 03/02/2020   Neuropathy 01/24/2020   Chronic back pain 01/24/2020   Complaints of leg weakness 01/24/2020   Compression fracture of T11 vertebra (HCC) 01/23/2020   Leg pain 01/03/2020   Chronic anticoagulation 01/03/2020   Compression fracture of L1 lumbar vertebra (HCC) 07/27/2019   Low back pain 07/20/2019   Hyperglycemia 05/01/2019   Neck pain, musculoskeletal 11/16/2018   Constipation 11/16/2018   Hypokalemia    AVM (arteriovenous malformation) of colon    Lower GI bleed 11/06/2018   Iron deficiency anemia 11/02/2018   Lymphedema of left lower extremity 10/22/2018   B12 deficiency 05/27/2018   Dependent edema 05/25/2018   Pulmonary hypertension, primary (HCC) 05/20/2018   Dyslipidemia 05/20/2018   CHF (congestive heart failure) (HCC) 05/20/2018   Tricuspid regurgitation 05/17/2018   Mitral regurgitation 05/17/2018   Chronic a-fib 05/17/2018   Essential hypertension 05/17/2018   Hypothyroid 05/17/2018    CAD (coronary artery disease) 05/17/2018   Carotid atherosclerosis, bilateral 05/17/2018   07/17/2018, PT, DPT 4:39 PM  06/26/20     [facility] [address] [place], [place], [place] Phone: [phone]   Fax:  [phone]  Name: Grace Alvarado MRN: Grace Alvarado Date of Birth: 1925-08-26

## 2020-06-27 ENCOUNTER — Encounter: Payer: Medicare Other | Admitting: Physical Therapy

## 2020-07-02 NOTE — Progress Notes (Signed)
Subjective:    Patient ID: Grace Alvarado, female    DOB: July 05, 1925, 86 y.o.   MRN: [id number]  HPI The patient is here for an acute visit.   Possible bedsore on bottom.  When she was in the hospital last she developed a bedsore, which persisted through rehab and then when she came home.  This did heal up.  Recently she has had a similar feeling in the same area and is concerned it is coming back.  She is exercising more and feels more active.  She does physical therapy twice a week.  She does sit a lot, but is surprised that with being more active it would come back.  She had used derma cloud in the rehab and when she came home and was wondering if she should start using that.   She is also had a scratchy throat.  She tends to get this related to the season.  She does state some drainage.  She does not take anything for allergies.  She denies fevers or chills.  Medications and allergies reviewed with patient and updated if appropriate.  Patient Active Problem List   Diagnosis Date Noted  . Aortic atherosclerosis (HCC) 06/13/2020  . Hyponatremia 03/07/2020  . DNR (do not resuscitate) 03/07/2020  . Prepatellar bursitis, left knee 03/02/2020  . Neuropathy 01/24/2020  . Chronic back pain 01/24/2020  . Complaints of leg weakness 01/24/2020  . Compression fracture of T11 vertebra (HCC) 01/23/2020  . Leg pain 01/03/2020  . Chronic anticoagulation 01/03/2020  . Compression fracture of L1 lumbar vertebra (HCC) 07/27/2019  . Low back pain 07/20/2019  . Hyperglycemia 05/01/2019  . Neck pain, musculoskeletal 11/16/2018  . Constipation 11/16/2018  . Hypokalemia   . AVM (arteriovenous malformation) of colon   . Lower GI bleed 11/06/2018  . Iron deficiency anemia 11/02/2018  . Lymphedema of left lower extremity 10/22/2018  . B12 deficiency 05/27/2018  . Dependent edema 05/25/2018  . Pulmonary hypertension, primary (HCC) 05/20/2018  . Dyslipidemia 05/20/2018  . CHF (congestive heart  failure) (HCC) 05/20/2018  . Tricuspid regurgitation 05/17/2018  . Mitral regurgitation 05/17/2018  . Chronic a-fib 05/17/2018  . Essential hypertension 05/17/2018  . Hypothyroid 05/17/2018  . CAD (coronary artery disease) 05/17/2018  . Carotid atherosclerosis, bilateral 05/17/2018    Current Outpatient Medications on File Prior to Visit  Medication Sig Dispense Refill  . acetaminophen (TYLENOL) 325 MG tablet Take 1-2 tablets (325-650 mg total) by mouth every 6 (six) hours as needed for mild pain (pain score 1-3 or temp > 100.5).    Marland Kitchen acidophilus (RISAQUAD) CAPS capsule Take 1 capsule by mouth daily.    . B Complex-C (B-COMPLEX WITH VITAMIN C) tablet Take 1 tablet by mouth daily.    Marland Kitchen diltiazem (CARDIZEM) 120 MG tablet Take 120 mg by mouth in the morning and at bedtime.    . docusate sodium (COLACE) 100 MG capsule Take 1 capsule (100 mg total) by mouth daily. Hold for loose bowel movements 60 capsule 0  . furosemide (LASIX) 20 MG tablet Take 1 tablet (20 mg total) by mouth every other day. 30 tablet   . Infant Care Products (DERMACLOUD) CREA Apply 1 application topically in the morning and at bedtime. Apply to buttocks.    Marland Kitchen levothyroxine (SYNTHROID) 75 MCG tablet TAKE 1 TABLET (75 MCG TOTAL) BY MOUTH DAILY BEFORE BREAKFAST. 90 tablet 1  . polyethylene glycol (MIRALAX) 17 g packet Take 17 g by mouth daily. Hold for loose bowel movements 60  each 0  . potassium chloride (KLOR-CON) 10 MEQ tablet Take 1 tablet (10 mEq total) by mouth every other day.    . pravastatin (PRAVACHOL) 10 MG tablet TAKE 1 TABLET BY MOUTH EVERY DAY (Patient taking differently: Take 10 mg by mouth daily. ) 90 tablet 3  . Rivaroxaban (XARELTO) 15 MG TABS tablet Take 1 tablet (15 mg total) by mouth daily with supper. 90 tablet 1  . Vitamin D, Ergocalciferol, (DRISDOL) 1.25 MG (50000 UNIT) CAPS capsule Take 1 capsule (50,000 Units total) by mouth every 7 (seven) days. (Patient taking differently: Take 50,000 Units by mouth  every 7 (seven) days. Thursday) 8 capsule 0  . [DISCONTINUED] metoprolol tartrate (LOPRESSOR) 25 MG tablet Take 0.5 tablets (12.5 mg total) by mouth 2 (two) times daily. (Patient not taking: Reported on 04/07/2020)     No current facility-administered medications on file prior to visit.    Past Medical History:  Diagnosis Date  . Atrial fibrillation (HCC)   . Hyperlipidemia   . Hypertension   . Hypothyroidism   . Mitral regurgitation   . MVP (mitral valve prolapse)     Past Surgical History:  Procedure Laterality Date  . APPENDECTOMY    . c section    . CESAREAN SECTION    . CHOLECYSTECTOMY    . COLONOSCOPY WITH PROPOFOL N/A 11/09/2018   Procedure: COLONOSCOPY WITH PROPOFOL;  Surgeon: Sherrilyn Rist, MD;  Location: [facility] ENDOSCOPY;  Service: Gastroenterology;  Laterality: N/A;  . ESOPHAGOGASTRODUODENOSCOPY (EGD) WITH PROPOFOL N/A 11/09/2018   Procedure: ESOPHAGOGASTRODUODENOSCOPY (EGD) WITH PROPOFOL;  Surgeon: Sherrilyn Rist, MD;  Location: [facility] ENDOSCOPY;  Service: Gastroenterology;  Laterality: N/A;  . HOT HEMOSTASIS N/A 11/09/2018   Procedure: HOT HEMOSTASIS (ARGON PLASMA COAGULATION/BICAP);  Surgeon: Sherrilyn Rist, MD;  Location: [facility] ENDOSCOPY;  Service: Gastroenterology;  Laterality: N/A;  . I & D EXTREMITY Left 03/02/2020   Procedure: evacuation of left knee hematoma, possible wound vac placement;  Surgeon: Cammy Copa, MD;  Location: [facility] OR;  Service: Orthopedics;  Laterality: Left;  . SUBMUCOSAL INJECTION  11/09/2018   Procedure: SUBMUCOSAL INJECTION;  Surgeon: Sherrilyn Rist, MD;  Location: [facility] ENDOSCOPY;  Service: Gastroenterology;;    Social History   Socioeconomic History  . Marital status: Widowed    Spouse name: Not on file  . Number of children: 3  . Years of education: Not on file  . Highest education level: Not on file  Occupational History  . Occupation: retired  Tobacco Use  . Smoking status: Former Games developer  . Smokeless tobacco: Never Used    Vaping Use  . Vaping Use: Never used  Substance and Sexual Activity  . Alcohol use: Not Currently  . Drug use: Never  . Sexual activity: Not Currently  Other Topics Concern  . Not on file  Social History Narrative  . Not on file   Social Determinants of Health   Financial Resource Strain:   . Difficulty of Paying Living Expenses: Not on file  Food Insecurity:   . Worried About Programme researcher, broadcasting/film/video in the Last Year: Not on file  . Ran Out of Food in the Last Year: Not on file  Transportation Needs:   . Lack of Transportation (Medical): Not on file  . Lack of Transportation (Non-Medical): Not on file  Physical Activity:   . Days of Exercise per Week: Not on file  . Minutes of Exercise per Session: Not on file  Stress:   .  Feeling of Stress : Not on file  Social Connections:   . Frequency of Communication with Friends and Family: Not on file  . Frequency of Social Gatherings with Friends and Family: Not on file  . Attends Religious Services: Not on file  . Active Member of Clubs or Organizations: Not on file  . Attends Banker Meetings: Not on file  . Marital Status: Not on file    Family History  Problem Relation Age of Onset  . Cancer Mother   . Dementia Mother     Review of Systems  Constitutional: Negative for chills and fever.  HENT: Positive for postnasal drip, rhinorrhea and sore throat (scratchy).   Neurological: Positive for headaches (sinus).       Objective:   Vitals:   07/03/20 0843  BP: 122/82  Pulse: 90  Temp: 97.7 F (36.5 C)  SpO2: 98%   BP Readings from Last 3 Encounters:  07/03/20 122/82  06/13/20 118/70  05/14/20 126/70   Wt Readings from Last 3 Encounters:  07/03/20 131 lb 6.4 oz (59.6 kg)  06/13/20 132 lb 6.4 oz (60.1 kg)  05/14/20 130 lb (59 kg)   Body mass index is 23.28 kg/m.   Physical Exam Constitutional:      General: She is not in acute distress.    Appearance: Normal appearance. She is not ill-appearing.   HENT:     Head: Normocephalic and atraumatic.     Mouth/Throat:     Mouth: Mucous membranes are moist.     Pharynx: No oropharyngeal exudate or posterior oropharyngeal erythema.  Cardiovascular:     Rate and Rhythm: Normal rate and regular rhythm.  Pulmonary:     Effort: Pulmonary effort is normal. No respiratory distress.     Breath sounds: No wheezing or rales.  Musculoskeletal:     Cervical back: Neck supple. No tenderness.  Lymphadenopathy:     Cervical: No cervical adenopathy.  Skin:    General: Skin is warm and dry.     Findings: Erythema (Mild erythema in sacral region without any breaks in skin.Possible small pilonidal cyst-?  New versus old-no drainage) present.  Neurological:     Mental Status: She is alert.            Assessment & Plan:    See Problem List for Assessment and Plan of chronic medical problems.    This visit occurred during the SARS-CoV-2 public health emergency.  Safety protocols were in place, including screening questions prior to the visit, additional usage of staff PPE, and extensive cleaning of exam room while observing appropriate contact time as indicated for disinfecting solutions.

## 2020-07-03 ENCOUNTER — Encounter: Payer: Self-pay | Admitting: Physical Therapy

## 2020-07-03 ENCOUNTER — Ambulatory Visit (INDEPENDENT_AMBULATORY_CARE_PROVIDER_SITE_OTHER): Payer: Medicare Other | Admitting: Internal Medicine

## 2020-07-03 ENCOUNTER — Other Ambulatory Visit: Payer: Self-pay

## 2020-07-03 ENCOUNTER — Encounter: Payer: Self-pay | Admitting: Internal Medicine

## 2020-07-03 ENCOUNTER — Ambulatory Visit (INDEPENDENT_AMBULATORY_CARE_PROVIDER_SITE_OTHER): Payer: Medicare Other | Admitting: Physical Therapy

## 2020-07-03 DIAGNOSIS — J309 Allergic rhinitis, unspecified: Secondary | ICD-10-CM | POA: Insufficient documentation

## 2020-07-03 DIAGNOSIS — L89151 Pressure ulcer of sacral region, stage 1: Secondary | ICD-10-CM

## 2020-07-03 DIAGNOSIS — J302 Other seasonal allergic rhinitis: Secondary | ICD-10-CM | POA: Diagnosis not present

## 2020-07-03 DIAGNOSIS — R2689 Other abnormalities of gait and mobility: Secondary | ICD-10-CM | POA: Diagnosis not present

## 2020-07-03 DIAGNOSIS — M6281 Muscle weakness (generalized): Secondary | ICD-10-CM | POA: Diagnosis not present

## 2020-07-03 NOTE — Patient Instructions (Signed)
You have a mild pressure sore.     Continue doing what doing for your sore.  Use the dermacloud twice daily, shift your weight off the area and keep the area clean.   Your throat looks good - it is likely related to allergies.  Just treat it symptomatically.

## 2020-07-03 NOTE — Assessment & Plan Note (Signed)
Acute Stage I-mild erythema without skin breakage Okay to use derma cloud twice daily Keep area clean Discussed offloading pressure to the area as much as possible, which is the most important thing for allowing healing and to prevent this from getting worse If she does not feel this is improving she can return here to have it evaluated or can order home nursing

## 2020-07-03 NOTE — Assessment & Plan Note (Signed)
OverallChronic, seasonal She is experiencing some drainage and scratchy throat-typical of what sounds like allergies Mild in nature and she does not take any medication No evidence of infection Symptomatic treatment

## 2020-07-03 NOTE — Therapy (Signed)
[facility] [address] [place], [place], [address] Phone: [phone]   Fax:  [phone]  Physical Therapy Treatment  Patient Details  Name: Grace Alvarado MRN: [id number] Date of Birth: 1925-04-18 Referring Provider (PT): Terrilee Files   Encounter Date: 07/03/2020   PT End of Session - 07/03/20 1602    Visit Number 4    Number of Visits 12    Date for PT Re-Evaluation 07/30/20    Authorization Type [employer]    PT Start Time 1220    PT Stop Time 1305    PT Time Calculation (min) 45 min    Equipment Utilized During Treatment Gait belt    Activity Tolerance Patient tolerated treatment well    Behavior During Therapy WFL for tasks assessed/performed           Past Medical History:  Diagnosis Date   Atrial fibrillation (HCC)    Hyperlipidemia    Hypertension    Hypothyroidism    Mitral regurgitation    MVP (mitral valve prolapse)     Past Surgical History:  Procedure Laterality Date   APPENDECTOMY     c section     CESAREAN SECTION     CHOLECYSTECTOMY     COLONOSCOPY WITH PROPOFOL N/A 11/09/2018   Procedure: COLONOSCOPY WITH PROPOFOL;  Surgeon: Sherrilyn Rist, MD;  Location: [facility] ENDOSCOPY;  Service: Gastroenterology;  Laterality: N/A;   ESOPHAGOGASTRODUODENOSCOPY (EGD) WITH PROPOFOL N/A 11/09/2018   Procedure: ESOPHAGOGASTRODUODENOSCOPY (EGD) WITH PROPOFOL;  Surgeon: Sherrilyn Rist, MD;  Location: [facility] ENDOSCOPY;  Service: Gastroenterology;  Laterality: N/A;   HOT HEMOSTASIS N/A 11/09/2018   Procedure: HOT HEMOSTASIS (ARGON PLASMA COAGULATION/BICAP);  Surgeon: Sherrilyn Rist, MD;  Location: [facility] ENDOSCOPY;  Service: Gastroenterology;  Laterality: N/A;   I & D EXTREMITY Left 03/02/2020   Procedure: evacuation of left knee hematoma, possible wound vac placement;  Surgeon: Cammy Copa, MD;  Location: [facility] OR;  Service: Orthopedics;  Laterality: Left;   SUBMUCOSAL INJECTION  11/09/2018   Procedure: SUBMUCOSAL  INJECTION;  Surgeon: Sherrilyn Rist, MD;  Location: [facility] ENDOSCOPY;  Service: Gastroenterology;;    There were no vitals filed for this visit.   Subjective Assessment - 07/03/20 1601    Subjective Pt thinks she is getting stronger. Brought regular RW today to assess bottom/skis.    Patient Stated Goals Improved balance,    Currently in Pain? Yes    Pain Score 3     Pain Location Back    Pain Orientation Right;Left    Pain Descriptors / Indicators Aching    Pain Type Chronic pain    Pain Onset More than a month ago    Pain Frequency Intermittent                             [facility] Adult PT Treatment/Exercise - 07/03/20 1232      Ambulation/Gait   Gait Comments 35 ft x 6 with RW,  x4 with no AD , x 6 with [facility]       Exercises   Exercises Knee/Hip      Knee/Hip Exercises: Aerobic   Recumbent Bike --      Knee/Hip Exercises: Standing   Heel Raises 20 reps    Hip Flexion 20 reps;Knee bent    Hip Abduction 2 sets;10 reps;Both    Forward Step Up 10 reps;Both;Step Height: 6";Hand Hold: 2    Stairs up/down 5  steps x 6, with practice for 1 and 2 hand rails.     Other Standing Knee Exercises --    Other Standing Knee Exercises   Lateral and fwd/bwd stepping with weight shifts x 20 each bil;   L and R reaching latera/ outside of BOS x 8 each ;       Knee/Hip Exercises: Seated   Long Arc Quad --    Long [employer] Limitations --    Other Seated Knee/Hip Exercises Rows YTB x 20;     Hamstring Curl --    Hamstring Limitations --    Sit to [employer] 10 reps                    PT Short Term Goals - 06/20/20 1049      PT SHORT TERM GOAL #1   Title Pt to be independent with initial HEP    Time 2    Period Weeks    Status New    Target Date 07/02/20      PT SHORT TERM GOAL #2   Title Pt will demo optimal posture with use of RW , with improved fwd flexion, and improved shoulder position    Time 2    Period Weeks    Status New    Target Date 07/02/20              PT Long Term Goals - 06/20/20 1050      PT LONG TERM GOAL #1   Title Pt to be independent with final HEP    Time 6    Period Weeks    Status New    Target Date 07/30/20      PT LONG TERM GOAL #2   Title Pt to be independent/safe with ambulation up to 500 ft with LRAD.    Time 6    Period Weeks    Status New    Target Date 07/30/20      PT LONG TERM GOAL #3   Title Pt to be independent/safe for navigation of at least 5 steps with 1 HR ,recipricol pattern for improved independence in community    Time 6    Period Weeks    Status New    Target Date 07/30/20      PT LONG TERM GOAL #4   Title Pt to demo dynamic balance to be WNL for pt age, to improve safety with home and community navigation/ Improve BERG by at least 5 points    Time 6    Period Weeks    Status New    Target Date 07/30/20      PT LONG TERM GOAL #5   Title Pt to demo improved strength of bil LEs to be at least 4+/5 for improved stability and ambulation    Time 6    Period Weeks    Status New    Target Date 07/30/20                 Plan - 07/03/20 1603    Clinical Impression Statement Pt progressing well with ther ex and ambulation. Practiced walking with RW, SPC, and no AD today. Pt ready/safe to start practice with [facility] for short distances at home with supervision, but feels reluctant. Did recommend that she continue to use AD at all times. Progressing wtih balance and stability. Plan to progress as tolerated.    Personal Factors and Comorbidities Age    Examination-Activity Limitations Locomotion Level;Squat;Stairs;Stand;Lift  Examination-Participation Restrictions Meal Prep;Cleaning;Community Activity    Stability/Clinical Decision Making Stable/Uncomplicated    Rehab Potential Good    PT Frequency 2x / week    PT Duration 6 weeks    PT Treatment/Interventions ADLs/Self Care Home Management;Cryotherapy;Electrical Stimulation;Gait training;DME Instruction;Ultrasound;Moist  Heat;Iontophoresis 4mg /ml Dexamethasone;Stair training;Functional mobility training;Therapeutic activities;Therapeutic exercise;Neuromuscular re-education;Balance training;Manual techniques;Orthotic Fit/Training;Patient/family education;Passive range of motion;Dry needling;Taping;Spinal Manipulations;Energy conservation    Consulted and Agree with Plan of Care Patient           Patient will benefit from skilled therapeutic intervention in order to improve the following deficits and impairments:  Abnormal gait, Decreased range of motion, Decreased endurance, Decreased safety awareness, Pain, Decreased activity tolerance, Decreased balance, Decreased knowledge of use of DME, Impaired flexibility, Improper body mechanics, Decreased mobility, Decreased strength  Visit Diagnosis: Other abnormalities of gait and mobility  Muscle weakness (generalized)     Problem List Patient Active Problem List   Diagnosis Date Noted   Allergic rhinitis 07/03/2020   Pressure injury of sacral region, stage 1 07/03/2020   Aortic atherosclerosis (HCC) 06/13/2020   Hyponatremia 03/07/2020   DNR (do not resuscitate) 03/07/2020   Prepatellar bursitis, left knee 03/02/2020   Neuropathy 01/24/2020   Chronic back pain 01/24/2020   Complaints of leg weakness 01/24/2020   Compression fracture of T11 vertebra (HCC) 01/23/2020   Leg pain 01/03/2020   Chronic anticoagulation 01/03/2020   Compression fracture of L1 lumbar vertebra (HCC) 07/27/2019   Low back pain 07/20/2019   Hyperglycemia 05/01/2019   Neck pain, musculoskeletal 11/16/2018   Constipation 11/16/2018   Hypokalemia    AVM (arteriovenous malformation) of colon    Lower GI bleed 11/06/2018   Iron deficiency anemia 11/02/2018   Lymphedema of left lower extremity 10/22/2018   B12 deficiency 05/27/2018   Dependent edema 05/25/2018   Pulmonary hypertension, primary (HCC) 05/20/2018   Dyslipidemia 05/20/2018   CHF  (congestive heart failure) (HCC) 05/20/2018   Tricuspid regurgitation 05/17/2018   Mitral regurgitation 05/17/2018   Chronic a-fib 05/17/2018   Essential hypertension 05/17/2018   Hypothyroid 05/17/2018   CAD (coronary artery disease) 05/17/2018   Carotid atherosclerosis, bilateral 05/17/2018    07/17/2018, PT, DPT 4:11 PM  07/03/20    [facility] [address] [place], [place], [place] Phone: [phone]   Fax:  [phone]  Name: Grace Alvarado MRN: Elodia Florence Date of Birth: March 09, 1925

## 2020-07-05 ENCOUNTER — Other Ambulatory Visit: Payer: Self-pay

## 2020-07-05 ENCOUNTER — Encounter: Payer: Self-pay | Admitting: Physical Therapy

## 2020-07-05 ENCOUNTER — Ambulatory Visit (INDEPENDENT_AMBULATORY_CARE_PROVIDER_SITE_OTHER): Payer: Medicare Other | Admitting: Physical Therapy

## 2020-07-05 DIAGNOSIS — M6281 Muscle weakness (generalized): Secondary | ICD-10-CM

## 2020-07-05 DIAGNOSIS — R2689 Other abnormalities of gait and mobility: Secondary | ICD-10-CM | POA: Diagnosis not present

## 2020-07-05 NOTE — Therapy (Signed)
[facility] [address] [place], [place], [address] Phone: [phone]   Fax:  [phone]  Physical Therapy Treatment  Patient Details  Name: Grace Alvarado MRN: [id number] Date of Birth: 1924/12/12 Referring Provider (PT): Terrilee Files   Encounter Date: 07/05/2020   PT End of Session - 07/05/20 1605    Visit Number 5    Number of Visits 12    Date for PT Re-Evaluation 07/30/20    Authorization Type UHC    PT Start Time 1345    PT Stop Time 1430    PT Time Calculation (min) 45 min    Equipment Utilized During Treatment Gait belt    Activity Tolerance Patient tolerated treatment well    Behavior During Therapy [facility] for tasks assessed/performed           Past Medical History:  Diagnosis Date  . Atrial fibrillation (HCC)   . Hyperlipidemia   . Hypertension   . Hypothyroidism   . Mitral regurgitation   . MVP (mitral valve prolapse)     Past Surgical History:  Procedure Laterality Date  . APPENDECTOMY    . c section    . CESAREAN SECTION    . CHOLECYSTECTOMY    . COLONOSCOPY WITH PROPOFOL N/A 11/09/2018   Procedure: COLONOSCOPY WITH PROPOFOL;  Surgeon: Sherrilyn Rist, MD;  Location: [facility] ENDOSCOPY;  Service: Gastroenterology;  Laterality: N/A;  . ESOPHAGOGASTRODUODENOSCOPY (EGD) WITH PROPOFOL N/A 11/09/2018   Procedure: ESOPHAGOGASTRODUODENOSCOPY (EGD) WITH PROPOFOL;  Surgeon: Sherrilyn Rist, MD;  Location: [facility] ENDOSCOPY;  Service: Gastroenterology;  Laterality: N/A;  . HOT HEMOSTASIS N/A 11/09/2018   Procedure: HOT HEMOSTASIS (ARGON PLASMA COAGULATION/BICAP);  Surgeon: Sherrilyn Rist, MD;  Location: [facility] ENDOSCOPY;  Service: Gastroenterology;  Laterality: N/A;  . I & D EXTREMITY Left 03/02/2020   Procedure: evacuation of left knee hematoma, possible wound vac placement;  Surgeon: Cammy Copa, MD;  Location: [facility] OR;  Service: Orthopedics;  Laterality: Left;  . SUBMUCOSAL INJECTION  11/09/2018   Procedure: SUBMUCOSAL  INJECTION;  Surgeon: Sherrilyn Rist, MD;  Location: [facility] ENDOSCOPY;  Service: Gastroenterology;;    There were no vitals filed for this visit.   Subjective Assessment - 07/05/20 1604    Subjective Pt with no new complaints    Currently in Pain? Yes    Pain Score 3     Pain Location Back    Pain Orientation Right;Left    Pain Descriptors / Indicators Aching    Pain Type Chronic pain    Pain Onset More than a month ago    Pain Frequency Intermittent                             [facility] Adult PT Treatment/Exercise - 07/05/20 1353      Ambulation/Gait   Gait Comments 35 ft x 6 with RW practice for direction changes,  x4 with no AD , x 6 with SPC education on sequencing       Posture/Postural Control   Posture Comments practice and education for ant pelvic tilt with seated posture x 5 min       Exercises   Exercises Knee/Hip      Knee/Hip Exercises: Aerobic   Recumbent Bike L1 x 5 min ;       Knee/Hip Exercises: Standing   Heel Raises 20 reps    Hip Flexion 20 reps;Knee bent    Hip Abduction  2 sets;10 reps;Both    Forward Step Up --    Stairs --    Other Standing Knee Exercises   Lateral weight shifts x 20 each;   ant/post stepping with weight shifts x 25 bil;       Knee/Hip Exercises: Seated   Long Arc Quad 20 reps    Other Seated Knee/Hip Exercises Rows YTB x 20;     Sit to [employer] 10 reps                    PT Short Term Goals - 06/20/20 1049      PT SHORT TERM GOAL #1   Title Pt to be independent with initial HEP    Time 2    Period Weeks    Status New    Target Date 07/02/20      PT SHORT TERM GOAL #2   Title Pt will demo optimal posture with use of RW , with improved fwd flexion, and improved shoulder position    Time 2    Period Weeks    Status New    Target Date 07/02/20             PT Long Term Goals - 06/20/20 1050      PT LONG TERM GOAL #1   Title Pt to be independent with final HEP    Time 6    Period Weeks     Status New    Target Date 07/30/20      PT LONG TERM GOAL #2   Title Pt to be independent/safe with ambulation up to 500 ft with LRAD.    Time 6    Period Weeks    Status New    Target Date 07/30/20      PT LONG TERM GOAL #3   Title Pt to be independent/safe for navigation of at least 5 steps with 1 HR ,recipricol pattern for improved independence in community    Time 6    Period Weeks    Status New    Target Date 07/30/20      PT LONG TERM GOAL #4   Title Pt to demo dynamic balance to be WNL for pt age, to improve safety with home and community navigation/ Improve BERG by at least 5 points    Time 6    Period Weeks    Status New    Target Date 07/30/20      PT LONG TERM GOAL #5   Title Pt to demo improved strength of bil LEs to be at least 4+/5 for improved stability and ambulation    Time 6    Period Weeks    Status New    Target Date 07/30/20                 Plan - 07/05/20 1608    Clinical Impression Statement Pt progressing well with balance exercises. continued gait training with SPC, pt with improved stability and efficiency with this today. Discussed seated posture at length, with practice for ant pelvic tilt, pt with much diffiuclty sustaining this position.    Personal Factors and Comorbidities Age    Examination-Activity Limitations Locomotion Level;Squat;Stairs;Stand;Lift    Examination-Participation Restrictions Meal Prep;Cleaning;Community Activity    Stability/Clinical Decision Making Stable/Uncomplicated    Rehab Potential Good    PT Frequency 2x / week    PT Duration 6 weeks    PT Treatment/Interventions ADLs/Self Care Home Management;Cryotherapy;Electrical Stimulation;Gait training;DME Instruction;Ultrasound;Moist Heat;Iontophoresis 4mg /ml Dexamethasone;Stair training;Functional  mobility training;Therapeutic activities;Therapeutic exercise;Neuromuscular re-education;Balance training;Manual techniques;Orthotic Fit/Training;Patient/family  education;Passive range of motion;Dry needling;Taping;Spinal Manipulations;Energy conservation    Consulted and Agree with Plan of Care Patient           Patient will benefit from skilled therapeutic intervention in order to improve the following deficits and impairments:  Abnormal gait, Decreased range of motion, Decreased endurance, Decreased safety awareness, Pain, Decreased activity tolerance, Decreased balance, Decreased knowledge of use of DME, Impaired flexibility, Improper body mechanics, Decreased mobility, Decreased strength  Visit Diagnosis: Other abnormalities of gait and mobility  Muscle weakness (generalized)     Problem List Patient Active Problem List   Diagnosis Date Noted  . Allergic rhinitis 07/03/2020  . Pressure injury of sacral region, stage 1 07/03/2020  . Aortic atherosclerosis (HCC) 06/13/2020  . Hyponatremia 03/07/2020  . DNR (do not resuscitate) 03/07/2020  . Prepatellar bursitis, left knee 03/02/2020  . Neuropathy 01/24/2020  . Chronic back pain 01/24/2020  . Complaints of leg weakness 01/24/2020  . Compression fracture of T11 vertebra (HCC) 01/23/2020  . Leg pain 01/03/2020  . Chronic anticoagulation 01/03/2020  . Compression fracture of L1 lumbar vertebra (HCC) 07/27/2019  . Low back pain 07/20/2019  . Hyperglycemia 05/01/2019  . Neck pain, musculoskeletal 11/16/2018  . Constipation 11/16/2018  . Hypokalemia   . AVM (arteriovenous malformation) of colon   . Lower GI bleed 11/06/2018  . Iron deficiency anemia 11/02/2018  . Lymphedema of left lower extremity 10/22/2018  . B12 deficiency 05/27/2018  . Dependent edema 05/25/2018  . Pulmonary hypertension, primary (HCC) 05/20/2018  . Dyslipidemia 05/20/2018  . CHF (congestive heart failure) (HCC) 05/20/2018  . Tricuspid regurgitation 05/17/2018  . Mitral regurgitation 05/17/2018  . Chronic a-fib 05/17/2018  . Essential hypertension 05/17/2018  . Hypothyroid 05/17/2018  . CAD (coronary  artery disease) 05/17/2018  . Carotid atherosclerosis, bilateral 05/17/2018    Sedalia Muta, PT, DPT 4:11 PM  07/05/20    [facility] [address] [place], [place], [address] Phone: [phone]   Fax:  [phone]  Name: Milady Fleener MRN: [id number] Date of Birth: 10/09/1924

## 2020-07-10 ENCOUNTER — Other Ambulatory Visit: Payer: Self-pay

## 2020-07-10 ENCOUNTER — Encounter: Payer: Self-pay | Admitting: Physical Therapy

## 2020-07-10 ENCOUNTER — Ambulatory Visit (INDEPENDENT_AMBULATORY_CARE_PROVIDER_SITE_OTHER): Payer: Medicare Other | Admitting: Physical Therapy

## 2020-07-10 DIAGNOSIS — R2689 Other abnormalities of gait and mobility: Secondary | ICD-10-CM

## 2020-07-10 DIAGNOSIS — M6281 Muscle weakness (generalized): Secondary | ICD-10-CM | POA: Diagnosis not present

## 2020-07-10 NOTE — Therapy (Signed)
[facility] [address] [place], [place], [address] Phone: [phone]   Fax:  [phone]  Physical Therapy Treatment  Patient Details  Name: Grace Alvarado MRN: [id number] Date of Birth: Sep 04, 1925 Referring Provider (PT): Terrilee Files   Encounter Date: 07/10/2020   PT End of Session - 07/10/20 1226    Visit Number 6    Number of Visits 12    Date for PT Re-Evaluation 07/30/20    Authorization Type UHC    PT Start Time 1215    PT Stop Time 1258    PT Time Calculation (min) 43 min    Equipment Utilized During Treatment Gait belt    Activity Tolerance Patient tolerated treatment well    Behavior During Therapy [facility] for tasks assessed/performed           Past Medical History:  Diagnosis Date  . Atrial fibrillation (HCC)   . Hyperlipidemia   . Hypertension   . Hypothyroidism   . Mitral regurgitation   . MVP (mitral valve prolapse)     Past Surgical History:  Procedure Laterality Date  . APPENDECTOMY    . c section    . CESAREAN SECTION    . CHOLECYSTECTOMY    . COLONOSCOPY WITH PROPOFOL N/A 11/09/2018   Procedure: COLONOSCOPY WITH PROPOFOL;  Surgeon: Sherrilyn Rist, MD;  Location: [facility] ENDOSCOPY;  Service: Gastroenterology;  Laterality: N/A;  . ESOPHAGOGASTRODUODENOSCOPY (EGD) WITH PROPOFOL N/A 11/09/2018   Procedure: ESOPHAGOGASTRODUODENOSCOPY (EGD) WITH PROPOFOL;  Surgeon: Sherrilyn Rist, MD;  Location: [facility] ENDOSCOPY;  Service: Gastroenterology;  Laterality: N/A;  . HOT HEMOSTASIS N/A 11/09/2018   Procedure: HOT HEMOSTASIS (ARGON PLASMA COAGULATION/BICAP);  Surgeon: Sherrilyn Rist, MD;  Location: [facility] ENDOSCOPY;  Service: Gastroenterology;  Laterality: N/A;  . I & D EXTREMITY Left 03/02/2020   Procedure: evacuation of left knee hematoma, possible wound vac placement;  Surgeon: Cammy Copa, MD;  Location: [facility] OR;  Service: Orthopedics;  Laterality: Left;  . SUBMUCOSAL INJECTION  11/09/2018   Procedure: SUBMUCOSAL  INJECTION;  Surgeon: Sherrilyn Rist, MD;  Location: [facility] ENDOSCOPY;  Service: Gastroenterology;;    There were no vitals filed for this visit.   Subjective Assessment - 07/10/20 1225    Subjective Pt with no new complaints    Currently in Pain? Yes    Pain Score 2     Pain Location Back    Pain Orientation Right;Left    Pain Descriptors / Indicators Aching    Pain Type Chronic pain    Pain Onset More than a month ago    Pain Frequency Intermittent                             [facility] Adult PT Treatment/Exercise - 07/10/20 1227      Ambulation/Gait   Gait Comments 35 ft x 8 with SPC       Posture/Postural Control   Posture Comments practice and education for ant pelvic tilt with seated posture x 5 min       Exercises   Exercises Knee/Hip      Knee/Hip Exercises: Aerobic   Recumbent Bike L1 x 2 min ;       Knee/Hip Exercises: Standing   Heel Raises 20 reps    Hip Flexion 20 reps;Knee bent    Hip Abduction 2 sets;10 reps;Both    Abduction Limitations 2.5 lb     Stairs up/down 5  steps x 6, with 1 HR , recipricol     Other Standing Knee Exercises Rows YTB x 20;     Other Standing Knee Exercises lateral and posterior stepping with weight shifts x 25 bil;       Knee/Hip Exercises: Seated   Long Arc Quad 20 reps    Long Arc Quad Limitations 2.5 lb    Other Seated Knee/Hip Exercises --    Sit to [employer] --                    PT Short Term Goals - 06/20/20 1049      PT SHORT TERM GOAL #1   Title Pt to be independent with initial HEP    Time 2    Period Weeks    Status New    Target Date 07/02/20      PT SHORT TERM GOAL #2   Title Pt will demo optimal posture with use of RW , with improved fwd flexion, and improved shoulder position    Time 2    Period Weeks    Status New    Target Date 07/02/20             PT Long Term Goals - 06/20/20 1050      PT LONG TERM GOAL #1   Title Pt to be independent with final HEP    Time 6    Period  Weeks    Status New    Target Date 07/30/20      PT LONG TERM GOAL #2   Title Pt to be independent/safe with ambulation up to 500 ft with LRAD.    Time 6    Period Weeks    Status New    Target Date 07/30/20      PT LONG TERM GOAL #3   Title Pt to be independent/safe for navigation of at least 5 steps with 1 HR ,recipricol pattern for improved independence in community    Time 6    Period Weeks    Status New    Target Date 07/30/20      PT LONG TERM GOAL #4   Title Pt to demo dynamic balance to be WNL for pt age, to improve safety with home and community navigation/ Improve BERG by at least 5 points    Time 6    Period Weeks    Status New    Target Date 07/30/20      PT LONG TERM GOAL #5   Title Pt to demo improved strength of bil LEs to be at least 4+/5 for improved stability and ambulation    Time 6    Period Weeks    Status New    Target Date 07/30/20                 Plan - 07/10/20 1301    Clinical Impression Statement Pt improving with ability for ambulation with SPC, as well as with ability for strengthening. Plan to continue balance , gait and strengthening.    Personal Factors and Comorbidities Age    Examination-Activity Limitations Locomotion Level;Squat;Stairs;Stand;Lift    Examination-Participation Restrictions Meal Prep;Cleaning;Community Activity    Stability/Clinical Decision Making Stable/Uncomplicated    Rehab Potential Good    PT Frequency 2x / week    PT Duration 6 weeks    PT Treatment/Interventions ADLs/Self Care Home Management;Cryotherapy;Electrical Stimulation;Gait training;DME Instruction;Ultrasound;Moist Heat;Iontophoresis 4mg /ml Dexamethasone;Stair training;Functional mobility training;Therapeutic activities;Therapeutic exercise;Neuromuscular re-education;Balance training;Manual techniques;Orthotic Fit/Training;Patient/family education;Passive range of motion;Dry needling;Taping;Spinal  Manipulations;Energy conservation    Consulted and  Agree with Plan of Care Patient           Patient will benefit from skilled therapeutic intervention in order to improve the following deficits and impairments:  Abnormal gait, Decreased range of motion, Decreased endurance, Decreased safety awareness, Pain, Decreased activity tolerance, Decreased balance, Decreased knowledge of use of DME, Impaired flexibility, Improper body mechanics, Decreased mobility, Decreased strength  Visit Diagnosis: Other abnormalities of gait and mobility  Muscle weakness (generalized)     Problem List Patient Active Problem List   Diagnosis Date Noted  . Allergic rhinitis 07/03/2020  . Pressure injury of sacral region, stage 1 07/03/2020  . Aortic atherosclerosis (HCC) 06/13/2020  . Hyponatremia 03/07/2020  . DNR (do not resuscitate) 03/07/2020  . Prepatellar bursitis, left knee 03/02/2020  . Neuropathy 01/24/2020  . Chronic back pain 01/24/2020  . Complaints of leg weakness 01/24/2020  . Compression fracture of T11 vertebra (HCC) 01/23/2020  . Leg pain 01/03/2020  . Chronic anticoagulation 01/03/2020  . Compression fracture of L1 lumbar vertebra (HCC) 07/27/2019  . Low back pain 07/20/2019  . Hyperglycemia 05/01/2019  . Neck pain, musculoskeletal 11/16/2018  . Constipation 11/16/2018  . Hypokalemia   . AVM (arteriovenous malformation) of colon   . Lower GI bleed 11/06/2018  . Iron deficiency anemia 11/02/2018  . Lymphedema of left lower extremity 10/22/2018  . B12 deficiency 05/27/2018  . Dependent edema 05/25/2018  . Pulmonary hypertension, primary (HCC) 05/20/2018  . Dyslipidemia 05/20/2018  . CHF (congestive heart failure) (HCC) 05/20/2018  . Tricuspid regurgitation 05/17/2018  . Mitral regurgitation 05/17/2018  . Chronic a-fib 05/17/2018  . Essential hypertension 05/17/2018  . Hypothyroid 05/17/2018  . CAD (coronary artery disease) 05/17/2018  . Carotid atherosclerosis, bilateral 05/17/2018    Sedalia Muta, PT, DPT 1:03 PM   07/10/20    [facility] [facility] [address] [place], [place], [address] Phone: [phone]   Fax:  [phone]  Name: Grace Alvarado MRN: [id number] Date of Birth: 1924-12-30

## 2020-07-12 ENCOUNTER — Ambulatory Visit (INDEPENDENT_AMBULATORY_CARE_PROVIDER_SITE_OTHER): Payer: Medicare Other | Admitting: Physical Therapy

## 2020-07-12 ENCOUNTER — Encounter: Payer: Self-pay | Admitting: Physical Therapy

## 2020-07-12 ENCOUNTER — Other Ambulatory Visit: Payer: Self-pay

## 2020-07-12 DIAGNOSIS — M6281 Muscle weakness (generalized): Secondary | ICD-10-CM | POA: Diagnosis not present

## 2020-07-12 DIAGNOSIS — R2689 Other abnormalities of gait and mobility: Secondary | ICD-10-CM | POA: Diagnosis not present

## 2020-07-12 NOTE — Therapy (Signed)
[facility] [address] [place], [place], [address] Phone: [phone]   Fax:  [phone]  Physical Therapy Treatment  Patient Details  Name: Grace Alvarado MRN: [id number] Date of Birth: 22-May-1925 Referring Provider (PT): Terrilee Files   Encounter Date: 07/12/2020   PT End of Session - 07/12/20 1225    Visit Number 7    Number of Visits 12    Date for PT Re-Evaluation 07/30/20    Authorization Type UHC    PT Start Time 1219    PT Stop Time 1300    PT Time Calculation (min) 41 min    Equipment Utilized During Treatment Gait belt    Activity Tolerance Patient tolerated treatment well    Behavior During Therapy [facility] for tasks assessed/performed           Past Medical History:  Diagnosis Date  . Atrial fibrillation (HCC)   . Hyperlipidemia   . Hypertension   . Hypothyroidism   . Mitral regurgitation   . MVP (mitral valve prolapse)     Past Surgical History:  Procedure Laterality Date  . APPENDECTOMY    . c section    . CESAREAN SECTION    . CHOLECYSTECTOMY    . COLONOSCOPY WITH PROPOFOL N/A 11/09/2018   Procedure: COLONOSCOPY WITH PROPOFOL;  Surgeon: Sherrilyn Rist, MD;  Location[facility] ENDOSCOPY;  Service: Gastroenterology;  Laterality: N/A;  . ESOPHAGOGASTRODUODENOSCOPY (EGD) WITH PROPOFOL N/A 11/09/2018   Procedure: ESOPHAGOGASTRODUODENOSCOPY (EGD) WITH PROPOFOL;  Surgeon: Sherrilyn Rist, MD;  Location: [facility] ENDOSCOPY;  Service: Gastroenterology;  Laterality: N/A;  . HOT HEMOSTASIS N/A 11/09/2018   Procedure: HOT HEMOSTASIS (ARGON PLASMA COAGULATION/BICAP);  Surgeon: Sherrilyn Rist, MD;  Location: [facility] ENDOSCOPY;  Service: Gastroenterology;  Laterality: N/A;  . I & D EXTREMITY Left 03/02/2020   Procedure: evacuation of left knee hematoma, possible wound vac placement;  Surgeon: Cammy Copa, MD;  Location: [facility] OR;  Service: Orthopedics;  Laterality: Left;  . SUBMUCOSAL INJECTION  11/09/2018   Procedure: SUBMUCOSAL  INJECTION;  Surgeon: Sherrilyn Rist, MD;  Location: [facility] ENDOSCOPY;  Service: Gastroenterology;;    There were no vitals filed for this visit.   Subjective Assessment - 07/12/20 1225    Subjective Pt states shes tired today.                             [facility] Adult PT Treatment/Exercise - 07/12/20 1226      Ambulation/Gait   Gait Comments 35 ft x 8 with SPC , x 6 with no AD (contact guard)       Posture/Postural Control   Posture Comments --      Exercises   Exercises Knee/Hip      Knee/Hip Exercises: Aerobic   Recumbent Bike L1 x 7.5 min, 1 mile  ;       Knee/Hip Exercises: Standing   Heel Raises 20 reps    Hip Flexion 20 reps;Knee bent    Hip Abduction 2 sets;10 reps;Both    Abduction Limitations 2.5 lb     Stairs up/down 5 steps x 6, with 1 HR , recipricol     Other Standing Knee Exercises --    Other Standing Knee Exercises lateral and posterior stepping with weight shifts x 25 bil;       Knee/Hip Exercises: Seated   Long Arc Quad 20 reps    Long Arc Quad Limitations 2.5 lb  Sit to [employer] 10 reps                    PT Short Term Goals - 06/20/20 1049      PT SHORT TERM GOAL #1   Title Pt to be independent with initial HEP    Time 2    Period Weeks    Status New    Target Date 07/02/20      PT SHORT TERM GOAL #2   Title Pt will demo optimal posture with use of RW , with improved fwd flexion, and improved shoulder position    Time 2    Period Weeks    Status New    Target Date 07/02/20             PT Long Term Goals - 06/20/20 1050      PT LONG TERM GOAL #1   Title Pt to be independent with final HEP    Time 6    Period Weeks    Status New    Target Date 07/30/20      PT LONG TERM GOAL #2   Title Pt to be independent/safe with ambulation up to 500 ft with LRAD.    Time 6    Period Weeks    Status New    Target Date 07/30/20      PT LONG TERM GOAL #3   Title Pt to be independent/safe for navigation of at least  5 steps with 1 HR ,recipricol pattern for improved independence in community    Time 6    Period Weeks    Status New    Target Date 07/30/20      PT LONG TERM GOAL #4   Title Pt to demo dynamic balance to be WNL for pt age, to improve safety with home and community navigation/ Improve BERG by at least 5 points    Time 6    Period Weeks    Status New    Target Date 07/30/20      PT LONG TERM GOAL #5   Title Pt to demo improved strength of bil LEs to be at least 4+/5 for improved stability and ambulation    Time 6    Period Weeks    Status New    Target Date 07/30/20                 Plan - 07/12/20 1303    Clinical Impression Statement Pt progressing well with ambulation and use of SPC, encouraged practice at home w daughter, for improved confidence. Pt also improivng with ability and safety with stairs, able to do recipricol pattern with 1 Hand hold    Personal Factors and Comorbidities Age    Examination-Activity Limitations Locomotion Level;Squat;Stairs;Stand;Lift    Examination-Participation Restrictions Meal Prep;Cleaning;Community Activity    Stability/Clinical Decision Making Stable/Uncomplicated    Rehab Potential Good    PT Frequency 2x / week    PT Duration 6 weeks    PT Treatment/Interventions ADLs/Self Care Home Management;Cryotherapy;Electrical Stimulation;Gait training;DME Instruction;Ultrasound;Moist Heat;Iontophoresis 4mg /ml Dexamethasone;Stair training;Functional mobility training;Therapeutic activities;Therapeutic exercise;Neuromuscular re-education;Balance training;Manual techniques;Orthotic Fit/Training;Patient/family education;Passive range of motion;Dry needling;Taping;Spinal Manipulations;Energy conservation    Consulted and Agree with Plan of Care Patient           Patient will benefit from skilled therapeutic intervention in order to improve the following deficits and impairments:  Abnormal gait, Decreased range of motion, Decreased endurance,  Decreased safety awareness, Pain, Decreased activity tolerance, Decreased balance, Decreased knowledge  of use of DME, Impaired flexibility, Improper body mechanics, Decreased mobility, Decreased strength  Visit Diagnosis: Other abnormalities of gait and mobility  Muscle weakness (generalized)     Problem List Patient Active Problem List   Diagnosis Date Noted  . Allergic rhinitis 07/03/2020  . Pressure injury of sacral region, stage 1 07/03/2020  . Aortic atherosclerosis (HCC) 06/13/2020  . Hyponatremia 03/07/2020  . DNR (do not resuscitate) 03/07/2020  . Prepatellar bursitis, left knee 03/02/2020  . Neuropathy 01/24/2020  . Chronic back pain 01/24/2020  . Complaints of leg weakness 01/24/2020  . Compression fracture of T11 vertebra (HCC) 01/23/2020  . Leg pain 01/03/2020  . Chronic anticoagulation 01/03/2020  . Compression fracture of L1 lumbar vertebra (HCC) 07/27/2019  . Low back pain 07/20/2019  . Hyperglycemia 05/01/2019  . Neck pain, musculoskeletal 11/16/2018  . Constipation 11/16/2018  . Hypokalemia   . AVM (arteriovenous malformation) of colon   . Lower GI bleed 11/06/2018  . Iron deficiency anemia 11/02/2018  . Lymphedema of left lower extremity 10/22/2018  . B12 deficiency 05/27/2018  . Dependent edema 05/25/2018  . Pulmonary hypertension, primary (HCC) 05/20/2018  . Dyslipidemia 05/20/2018  . CHF (congestive heart failure) (HCC) 05/20/2018  . Tricuspid regurgitation 05/17/2018  . Mitral regurgitation 05/17/2018  . Chronic a-fib 05/17/2018  . Essential hypertension 05/17/2018  . Hypothyroid 05/17/2018  . CAD (coronary artery disease) 05/17/2018  . Carotid atherosclerosis, bilateral 05/17/2018    Sedalia Muta, PT, DPT 1:04 PM  07/12/20    [facility] [address] [place], [place], [address] Phone: [phone]   Fax:  [phone]  Name: Grace Alvarado MRN: [id number] Date of Birth: 09/25/1925

## 2020-07-13 ENCOUNTER — Ambulatory Visit (INDEPENDENT_AMBULATORY_CARE_PROVIDER_SITE_OTHER): Payer: Medicare Other | Admitting: *Deleted

## 2020-07-13 DIAGNOSIS — E538 Deficiency of other specified B group vitamins: Secondary | ICD-10-CM | POA: Diagnosis not present

## 2020-07-13 MED ORDER — CYANOCOBALAMIN 1000 MCG/ML IJ SOLN
1000.0000 ug | Freq: Once | INTRAMUSCULAR | Status: AC
  Administered 2020-07-13: 1000 ug via INTRAMUSCULAR

## 2020-07-13 NOTE — Progress Notes (Signed)
Pls cosign for B12 inj../lmb  

## 2020-07-18 ENCOUNTER — Encounter: Payer: Self-pay | Admitting: Internal Medicine

## 2020-07-18 ENCOUNTER — Other Ambulatory Visit: Payer: Self-pay | Admitting: *Deleted

## 2020-07-18 ENCOUNTER — Other Ambulatory Visit: Payer: Self-pay

## 2020-07-18 MED ORDER — POTASSIUM CHLORIDE CRYS ER 10 MEQ PO TBCR: 10.0000 meq | EXTENDED_RELEASE_TABLET | ORAL | 3 refills | Status: DC

## 2020-07-18 MED ORDER — DILTIAZEM HCL 120 MG PO TABS: 120.0000 mg | ORAL_TABLET | Freq: Two times a day (BID) | ORAL | 3 refills | Status: DC

## 2020-07-18 MED ORDER — RIVAROXABAN 15 MG PO TABS: 15.0000 mg | ORAL_TABLET | Freq: Every day | ORAL | 3 refills | Status: DC

## 2020-07-18 MED ORDER — FUROSEMIDE 20 MG PO TABS: 20.0000 mg | ORAL_TABLET | ORAL | 3 refills | Status: DC

## 2020-07-18 MED ORDER — PRAVASTATIN SODIUM 10 MG PO TABS: 10.0000 mg | ORAL_TABLET | Freq: Every day | ORAL | 3 refills | Status: DC

## 2020-07-19 ENCOUNTER — Other Ambulatory Visit: Payer: Self-pay

## 2020-07-19 ENCOUNTER — Ambulatory Visit (INDEPENDENT_AMBULATORY_CARE_PROVIDER_SITE_OTHER): Payer: Medicare Other | Admitting: Physical Therapy

## 2020-07-19 ENCOUNTER — Encounter: Payer: Self-pay | Admitting: Physical Therapy

## 2020-07-19 DIAGNOSIS — M6281 Muscle weakness (generalized): Secondary | ICD-10-CM | POA: Diagnosis not present

## 2020-07-19 DIAGNOSIS — R2689 Other abnormalities of gait and mobility: Secondary | ICD-10-CM

## 2020-07-19 MED ORDER — LEVOTHYROXINE SODIUM 75 MCG PO TABS: 75.0000 ug | ORAL_TABLET | Freq: Every day | ORAL | 1 refills | Status: DC

## 2020-07-19 NOTE — Therapy (Signed)
[facility] [address] [place], [place], [address] Phone: [phone]   Fax:  [phone]  Physical Therapy Treatment  Patient Details  Name: Grace Alvarado MRN: [id number] Date of Birth: 10/10/1924 Referring Provider (PT): Terrilee Files   Encounter Date: 07/19/2020   PT End of Session - 07/19/20 1529    Visit Number 8    Number of Visits 12    Date for PT Re-Evaluation 07/30/20    Authorization Type U[employer]    PT Start Time 1525    PT Stop Time 1558    PT Time Calculation (min) 33 min    Equipment Utilized During Treatment Gait belt    Activity Tolerance Patient tolerated treatment well    Behavior During Therapy [facility] for tasks assessed/performed           Past Medical History:  Diagnosis Date  . Atrial fibrillation (HCC)   . Hyperlipidemia   . Hypertension   . Hypothyroidism   . Mitral regurgitation   . MVP (mitral valve prolapse)     Past Surgical History:  Procedure Laterality Date  . APPENDECTOMY    . c section    . CESAREAN SECTION    . CHOLECYSTECTOMY    . COLONOSCOPY WITH PROPOFOL N/A 11/09/2018   Procedure: COLONOSCOPY WITH PROPOFOL;  Surgeon: Sherrilyn Rist, MD;  Location: [facility] ENDOSCOPY;  Service: Gastroenterology;  Laterality: N/A;  . ESOPHAGOGASTRODUODENOSCOPY (EGD) WITH PROPOFOL N/A 11/09/2018   Procedure: ESOPHAGOGASTRODUODENOSCOPY (EGD) WITH PROPOFOL;  Surgeon: Sherrilyn Rist, MD;  Location: [facility] ENDOSCOPY;  Service: Gastroenterology;  Laterality: N/A;  . HOT HEMOSTASIS N/A 11/09/2018   Procedure: HOT HEMOSTASIS (ARGON PLASMA COAGULATION/BICAP);  Surgeon: Sherrilyn Rist, MD;  Location: [facility] ENDOSCOPY;  Service: Gastroenterology;  Laterality: N/A;  . I & D EXTREMITY Left 03/02/2020   Procedure: evacuation of left knee hematoma, possible wound vac placement;  Surgeon: Cammy Copa, MD;  Location: [facility] OR;  Service: Orthopedics;  Laterality: Left;  . SUBMUCOSAL INJECTION  11/09/2018   Procedure: SUBMUCOSAL  INJECTION;  Surgeon: Sherrilyn Rist, MD;  Location: [facility] ENDOSCOPY;  Service: Gastroenterology;;    There were no vitals filed for this visit.   Subjective Assessment - 07/19/20 1528    Subjective Pt states she is tired today, did not sleep well last night. Has been doing HEP    Currently in Pain? No/denies                             [facility] Adult PT Treatment/Exercise - 07/19/20 1532      Ambulation/Gait   Gait Comments 35 ft x 8 with SPC , x 6 with no AD (contact guard)       Exercises   Exercises Knee/Hip      Knee/Hip Exercises: Aerobic   Recumbent Bike L1 x 7.5 min, 1 mile  ;       Knee/Hip Exercises: Standing   Heel Raises 20 reps    Hip Flexion 20 reps;Knee bent    Hip Abduction 2 sets;10 reps;Both    Abduction Limitations --    Stairs up/down 5 steps x 6, with 1 HR , recipricol     Other Standing Knee Exercises side stepping 15 ft x6    Other Standing Knee Exercises staggered stance wight shifts x 20 bil;       Knee/Hip Exercises: Seated   Long Arc Quad 20 reps    Long  Arc Quad Limitations 2.5 lb    Sit to [employer]                  PT Education - 07/19/20 1529    Education Details Reviewed HEP    Person(s) Educated Patient    Methods Explanation;Demonstration;Verbal cues    Comprehension Verbalized understanding;Returned demonstration;Verbal cues required            PT Short Term Goals - 06/20/20 1049      PT SHORT TERM GOAL #1   Title Pt to be independent with initial HEP    Time 2    Period Weeks    Status New    Target Date 07/02/20      PT SHORT TERM GOAL #2   Title Pt will demo optimal posture with use of RW , with improved fwd flexion, and improved shoulder position    Time 2    Period Weeks    Status New    Target Date 07/02/20             PT Long Term Goals - 06/20/20 1050      PT LONG TERM GOAL #1   Title Pt to be independent with final HEP    Time 6    Period Weeks    Status New    Target Date  07/30/20      PT LONG TERM GOAL #2   Title Pt to be independent/safe with ambulation up to 500 ft with LRAD.    Time 6    Period Weeks    Status New    Target Date 07/30/20      PT LONG TERM GOAL #3   Title Pt to be independent/safe for navigation of at least 5 steps with 1 HR ,recipricol pattern for improved independence in community    Time 6    Period Weeks    Status New    Target Date 07/30/20      PT LONG TERM GOAL #4   Title Pt to demo dynamic balance to be WNL for pt age, to improve safety with home and community navigation/ Improve BERG by at least 5 points    Time 6    Period Weeks    Status New    Target Date 07/30/20      PT LONG TERM GOAL #5   Title Pt to demo improved strength of bil LEs to be at least 4+/5 for improved stability and ambulation    Time 6    Period Weeks    Status New    Target Date 07/30/20                 Plan - 07/19/20 1704    Clinical Impression Statement Pt progressing well with strength and ability for ther ex. Plan to see pt next week, and likely d/c to HEP.    Personal Factors and Comorbidities Age    Examination-Activity Limitations Locomotion Level;Squat;Stairs;Stand;Lift    Examination-Participation Restrictions Meal Prep;Cleaning;Community Activity    Stability/Clinical Decision Making Stable/Uncomplicated    Rehab Potential Good    PT Frequency 2x / week    PT Duration 6 weeks    PT Treatment/Interventions ADLs/Self Care Home Management;Cryotherapy;Electrical Stimulation;Gait training;DME Instruction;Ultrasound;Moist Heat;Iontophoresis 4mg /ml Dexamethasone;Stair training;Functional mobility training;Therapeutic activities;Therapeutic exercise;Neuromuscular re-education;Balance training;Manual techniques;Orthotic Fit/Training;Patient/family education;Passive range of motion;Dry needling;Taping;Spinal Manipulations;Energy conservation    Consulted and Agree with Plan of Care Patient           Patient will  benefit from  skilled therapeutic intervention in order to improve the following deficits and impairments:  Abnormal gait, Decreased range of motion, Decreased endurance, Decreased safety awareness, Pain, Decreased activity tolerance, Decreased balance, Decreased knowledge of use of DME, Impaired flexibility, Improper body mechanics, Decreased mobility, Decreased strength  Visit Diagnosis: Other abnormalities of gait and mobility  Muscle weakness (generalized)     Problem List Patient Active Problem List   Diagnosis Date Noted  . Allergic rhinitis 07/03/2020  . Pressure injury of sacral region, stage 1 07/03/2020  . Aortic atherosclerosis (HCC) 06/13/2020  . Hyponatremia 03/07/2020  . DNR (do not resuscitate) 03/07/2020  . Prepatellar bursitis, left knee 03/02/2020  . Neuropathy 01/24/2020  . Chronic back pain 01/24/2020  . Complaints of leg weakness 01/24/2020  . Compression fracture of T11 vertebra (HCC) 01/23/2020  . Leg pain 01/03/2020  . Chronic anticoagulation 01/03/2020  . Compression fracture of L1 lumbar vertebra (HCC) 07/27/2019  . Low back pain 07/20/2019  . Hyperglycemia 05/01/2019  . Neck pain, musculoskeletal 11/16/2018  . Constipation 11/16/2018  . Hypokalemia   . AVM (arteriovenous malformation) of colon   . Lower GI bleed 11/06/2018  . Iron deficiency anemia 11/02/2018  . Lymphedema of left lower extremity 10/22/2018  . B12 deficiency 05/27/2018  . Dependent edema 05/25/2018  . Pulmonary hypertension, primary (HCC) 05/20/2018  . Dyslipidemia 05/20/2018  . CHF (congestive heart failure) (HCC) 05/20/2018  . Tricuspid regurgitation 05/17/2018  . Mitral regurgitation 05/17/2018  . Chronic a-fib 05/17/2018  . Essential hypertension 05/17/2018  . Hypothyroid 05/17/2018  . CAD (coronary artery disease) 05/17/2018  . Carotid atherosclerosis, bilateral 05/17/2018   Sedalia Muta, PT, DPT 5:05 PM  07/19/20    [facility] [address] [place], [place], [address] Phone: [phone]   Fax:  [phone]  Name: Grace Alvarado MRN: [id number] Date of Birth: 01/05/1925

## 2020-07-24 MED ORDER — DILTIAZEM HCL ER 120 MG PO CP12: 120.0000 mg | ORAL_CAPSULE | Freq: Two times a day (BID) | ORAL | 3 refills | Status: DC

## 2020-07-24 MED ORDER — DILTIAZEM HCL ER COATED BEADS 120 MG PO CP24
120.0000 mg | ORAL_CAPSULE | Freq: Every day | ORAL | 3 refills | Status: DC
Start: 2020-07-24 — End: 2020-07-24

## 2020-07-25 ENCOUNTER — Encounter: Payer: Medicare Other | Admitting: Physical Therapy

## 2020-07-29 ENCOUNTER — Encounter: Payer: Self-pay | Admitting: Internal Medicine

## 2020-07-30 NOTE — Progress Notes (Signed)
Subjective:    Patient ID: Grace Alvarado, female    DOB: 1925-09-03, 86 y.o.   MRN: [id number]  HPI The patient is here for an acute visit.   Sacral ulcer:  Sometimes it is more painful.  Sometimes it is not painful.  She has been washing it, but not scrubbing it.  She has tried to offload the pressure when she sits.  She uses a neck pillow and that takes off the pressure of that area.  In bed she typically sleeps on her right side and has no pain.  She does not think it is necessarily worse, but is not sure.  She does drink 1 boost daily, but typically eats frozen dinners.  She does apply zinc oxide like cream.  She was concerned that she still has this.     Medications and allergies reviewed with patient and updated if appropriate.  Patient Active Problem List   Diagnosis Date Noted  . Allergic rhinitis 07/03/2020  . Pressure injury of sacral region, stage 1 07/03/2020  . Aortic atherosclerosis (HCC) 06/13/2020  . Hyponatremia 03/07/2020  . DNR (do not resuscitate) 03/07/2020  . Prepatellar bursitis, left knee 03/02/2020  . Neuropathy 01/24/2020  . Chronic back pain 01/24/2020  . Complaints of leg weakness 01/24/2020  . Compression fracture of T11 vertebra (HCC) 01/23/2020  . Leg pain 01/03/2020  . Chronic anticoagulation 01/03/2020  . Compression fracture of L1 lumbar vertebra (HCC) 07/27/2019  . Low back pain 07/20/2019  . Hyperglycemia 05/01/2019  . Neck pain, musculoskeletal 11/16/2018  . Constipation 11/16/2018  . Hypokalemia   . AVM (arteriovenous malformation) of colon   . Lower GI bleed 11/06/2018  . Iron deficiency anemia 11/02/2018  . Lymphedema of left lower extremity 10/22/2018  . B12 deficiency 05/27/2018  . Dependent edema 05/25/2018  . Pulmonary hypertension, primary (HCC) 05/20/2018  . Dyslipidemia 05/20/2018  . CHF (congestive heart failure) (HCC) 05/20/2018  . Tricuspid regurgitation 05/17/2018  . Mitral regurgitation 05/17/2018  . Chronic a-fib  05/17/2018  . Essential hypertension 05/17/2018  . Hypothyroid 05/17/2018  . CAD (coronary artery disease) 05/17/2018  . Carotid atherosclerosis, bilateral 05/17/2018    Current Outpatient Medications on File Prior to Visit  Medication Sig Dispense Refill  . acetaminophen (TYLENOL) 325 MG tablet Take 1-2 tablets (325-650 mg total) by mouth every 6 (six) hours as needed for mild pain (pain score 1-3 or temp > 100.5).    Marland Kitchen acidophilus (RISAQUAD) CAPS capsule Take 1 capsule by mouth daily.    . B Complex-C (B-COMPLEX WITH VITAMIN C) tablet Take 1 tablet by mouth daily.    Marland Kitchen diltiazem (CARDIZEM SR) 120 MG 12 hr capsule Take 1 capsule (120 mg total) by mouth 2 (two) times daily. 180 capsule 3  . docusate sodium (COLACE) 100 MG capsule Take 1 capsule (100 mg total) by mouth daily. Hold for loose bowel movements 60 capsule 0  . furosemide (LASIX) 20 MG tablet Take 1 tablet (20 mg total) by mouth every other day. 45 tablet 3  . Infant Care Products (DERMACLOUD) CREA Apply 1 application topically in the morning and at bedtime. Apply to buttocks.    Marland Kitchen levothyroxine (SYNTHROID) 75 MCG tablet Take 1 tablet (75 mcg total) by mouth daily before breakfast. 90 tablet 1  . polyethylene glycol (MIRALAX) 17 g packet Take 17 g by mouth daily. Hold for loose bowel movements 60 each 0  . potassium chloride (KLOR-CON) 10 MEQ tablet Take 1 tablet (10 mEq total) by mouth  every other day. 45 tablet 3  . pravastatin (PRAVACHOL) 10 MG tablet Take 1 tablet (10 mg total) by mouth daily. 90 tablet 3  . Rivaroxaban (XARELTO) 15 MG TABS tablet Take 1 tablet (15 mg total) by mouth daily with supper. 90 tablet 3  . Vitamin D, Ergocalciferol, (DRISDOL) 1.25 MG (50000 UNIT) CAPS capsule Take 1 capsule (50,000 Units total) by mouth every 7 (seven) days. (Patient taking differently: Take 50,000 Units by mouth every 7 (seven) days. Thursday) 8 capsule 0  . [DISCONTINUED] metoprolol tartrate (LOPRESSOR) 25 MG tablet Take 0.5 tablets  (12.5 mg total) by mouth 2 (two) times daily. (Patient not taking: Reported on 04/07/2020)     No current facility-administered medications on file prior to visit.    Past Medical History:  Diagnosis Date  . Atrial fibrillation (HCC)   . Hyperlipidemia   . Hypertension   . Hypothyroidism   . Mitral regurgitation   . MVP (mitral valve prolapse)     Past Surgical History:  Procedure Laterality Date  . APPENDECTOMY    . c section    . CESAREAN SECTION    . CHOLECYSTECTOMY    . COLONOSCOPY WITH PROPOFOL N/A 11/09/2018   Procedure: COLONOSCOPY WITH PROPOFOL;  Surgeon: Sherrilyn Rist, MD;  Location: [facility] ENDOSCOPY;  Service: Gastroenterology;  Laterality: N/A;  . ESOPHAGOGASTRODUODENOSCOPY (EGD) WITH PROPOFOL N/A 11/09/2018   Procedure: ESOPHAGOGASTRODUODENOSCOPY (EGD) WITH PROPOFOL;  Surgeon: Sherrilyn Rist, MD;  Location: [facility] ENDOSCOPY;  Service: Gastroenterology;  Laterality: N/A;  . HOT HEMOSTASIS N/A 11/09/2018   Procedure: HOT HEMOSTASIS (ARGON PLASMA COAGULATION/BICAP);  Surgeon: Sherrilyn Rist, MD;  Location: [facility] ENDOSCOPY;  Service: Gastroenterology;  Laterality: N/A;  . I & D EXTREMITY Left 03/02/2020   Procedure: evacuation of left knee hematoma, possible wound vac placement;  Surgeon: Cammy Copa, MD;  Location: [facility] OR;  Service: Orthopedics;  Laterality: Left;  . SUBMUCOSAL INJECTION  11/09/2018   Procedure: SUBMUCOSAL INJECTION;  Surgeon: Sherrilyn Rist, MD;  Location: [facility] ENDOSCOPY;  Service: Gastroenterology;;    Social History   Socioeconomic History  . Marital status: Widowed    Spouse name: Not on file  . Number of children: 3  . Years of education: Not on file  . Highest education level: Not on file  Occupational History  . Occupation: retired  Tobacco Use  . Smoking status: Former Games developer  . Smokeless tobacco: Never Used  Vaping Use  . Vaping Use: Never used  Substance and Sexual Activity  . Alcohol use: Not Currently  . Drug use: Never  .  Sexual activity: Not Currently  Other Topics Concern  . Not on file  Social History Narrative  . Not on file   Social Determinants of Health   Financial Resource Strain:   . Difficulty of Paying Living Expenses: Not on file  Food Insecurity:   . Worried About Programme researcher, broadcasting/film/video in the Last Year: Not on file  . Ran Out of Food in the Last Year: Not on file  Transportation Needs:   . Lack of Transportation (Medical): Not on file  . Lack of Transportation (Non-Medical): Not on file  Physical Activity:   . Days of Exercise per Week: Not on file  . Minutes of Exercise per Session: Not on file  Stress:   . Feeling of Stress : Not on file  Social Connections:   . Frequency of Communication with Friends and Family: Not on file  . Frequency  of Social Gatherings with Friends and Family: Not on file  . Attends Religious Services: Not on file  . Active Member of Clubs or Organizations: Not on file  . Attends Banker Meetings: Not on file  . Marital Status: Not on file    Family History  Problem Relation Age of Onset  . Cancer Mother   . Dementia Mother     Review of Systems  Constitutional: Negative for fever.  Gastrointestinal: Positive for diarrhea (loose stools). Negative for abdominal pain and nausea.  Genitourinary: Negative for dysuria and frequency.       Dark urine       Objective:   Vitals:   07/31/20 0930  BP: 118/78  Pulse: 96  Temp: 98.1 F (36.7 C)  SpO2: 98%   BP Readings from Last 3 Encounters:  07/31/20 118/78  07/03/20 122/82  06/13/20 118/70   Wt Readings from Last 3 Encounters:  07/31/20 131 lb (59.4 kg)  07/03/20 131 lb 6.4 oz (59.6 kg)  06/13/20 132 lb 6.4 oz (60.1 kg)   Body mass index is 23.21 kg/m.   Physical Exam Constitutional:      General: She is not in acute distress.    Appearance: Normal appearance. She is not ill-appearing.  HENT:     Head: Normocephalic and atraumatic.  Skin:    General: Skin is warm and dry.      Comments: Nontender erythema bilateral buttock regions without break in skin.  Pilonidal dimple or cyst-nondraining  Neurological:     Mental Status: She is alert.            Assessment & Plan:    See Problem List for Assessment and Plan of chronic medical problems.    This visit occurred during the SARS-CoV-2 public health emergency.  Safety protocols were in place, including screening questions prior to the visit, additional usage of staff PPE, and extensive cleaning of exam room while observing appropriate contact time as indicated for disinfecting solutions.

## 2020-07-31 ENCOUNTER — Other Ambulatory Visit: Payer: Self-pay

## 2020-07-31 ENCOUNTER — Encounter: Payer: Self-pay | Admitting: Internal Medicine

## 2020-07-31 ENCOUNTER — Ambulatory Visit (INDEPENDENT_AMBULATORY_CARE_PROVIDER_SITE_OTHER): Payer: Medicare Other | Admitting: Internal Medicine

## 2020-07-31 VITALS — BP 118/78 | HR 96 | Temp 98.1°F | Ht 63.0 in | Wt 131.0 lb

## 2020-07-31 DIAGNOSIS — L89151 Pressure ulcer of sacral region, stage 1: Secondary | ICD-10-CM

## 2020-07-31 DIAGNOSIS — E538 Deficiency of other specified B group vitamins: Secondary | ICD-10-CM | POA: Diagnosis not present

## 2020-07-31 DIAGNOSIS — R82998 Other abnormal findings in urine: Secondary | ICD-10-CM | POA: Diagnosis not present

## 2020-07-31 MED ORDER — CYANOCOBALAMIN 1000 MCG/ML IJ SOLN
1000.0000 ug | Freq: Once | INTRAMUSCULAR | Status: AC
  Administered 2020-07-31: 1000 ug via INTRAMUSCULAR

## 2020-07-31 NOTE — Patient Instructions (Addendum)
Start metamucil daily.  You can put this in water.  Take daily to see if it helps with your bowels.    Drop off a urine sample at the lab.    Continue to avoid pressure on your butt area - this is the most important thing to help this area to heal.    Consider taking InflammEnz a supplement for healing - you can get it on [facility].    Increase your protein intake - try drinking two boosts a day.   Continue to use butt paste or the dermacloud on the area.      You had a B12 injection today.

## 2020-07-31 NOTE — Addendum Note (Signed)
Addended by: Karma Ganja on: 07/31/2020 04:51 PM   Modules accepted: Orders

## 2020-07-31 NOTE — Assessment & Plan Note (Signed)
Acute Currently dark urine, but no dysuria or obvious increase in urination, but she is on a water pill We will check urinalysis, urine culture to make sure she does not have infection

## 2020-07-31 NOTE — Assessment & Plan Note (Signed)
Chronic Taking vitamin B12, but also getting B12 injections We will give her a B12 injection today-it is a little early, but given her age and the family needs to bring her here it would be easier to give it to her today Will periodically monitor B12 level and see if she needs continued injections

## 2020-07-31 NOTE — Assessment & Plan Note (Signed)
Subacute No change compared to 1 month ago No breaks in skin and erythematous area is nontender She denies any itching Continue to offload pressure to this area Increase protein intake and consider a supplement that may help with healing She will continue to use the zinc oxide creams She will let me know if there is no improvement in which case he may need to evaluate what looks like a pilonidal dimple or cyst in that area

## 2020-08-01 NOTE — Addendum Note (Signed)
Addended by: Vincenza Hews on: 08/01/2020 05:12 PM   Modules accepted: Orders

## 2020-08-01 NOTE — Addendum Note (Signed)
Addended by: Cloie Wooden on: 08/01/2020 05:12 PM   Modules accepted: Orders  

## 2020-08-02 LAB — URINALYSIS, ROUTINE W REFLEX MICROSCOPIC
Bilirubin Urine: NEGATIVE
Hgb urine dipstick: NEGATIVE
Ketones, ur: NEGATIVE
Leukocytes,Ua: NEGATIVE
Nitrite: NEGATIVE
RBC / HPF: NONE SEEN (ref 0–?)
Specific Gravity, Urine: <=1.005 — AB (ref 1.000–1.030)
Total Protein, Urine: NEGATIVE
Urine Glucose: NEGATIVE
Urobilinogen, UA: 0.2 (ref 0.0–1.0)
pH: 6 (ref 5.0–8.0)

## 2020-08-03 LAB — URINE CULTURE

## 2020-08-04 MED ORDER — SULFAMETHOXAZOLE-TRIMETHOPRIM 800-160 MG PO TABS
1.0000 | ORAL_TABLET | Freq: Two times a day (BID) | ORAL | 0 refills | Status: DC
Start: 2020-08-04 — End: 2020-09-17

## 2020-08-04 NOTE — Addendum Note (Signed)
Addended by: Pincus Sanes on: 08/04/2020 02:51 PM   Modules accepted: Orders

## 2020-08-08 ENCOUNTER — Other Ambulatory Visit: Payer: Self-pay

## 2020-08-08 ENCOUNTER — Ambulatory Visit (INDEPENDENT_AMBULATORY_CARE_PROVIDER_SITE_OTHER): Payer: Medicare Other | Admitting: Physical Therapy

## 2020-08-08 ENCOUNTER — Encounter: Payer: Self-pay | Admitting: Physical Therapy

## 2020-08-08 DIAGNOSIS — R2689 Other abnormalities of gait and mobility: Secondary | ICD-10-CM | POA: Diagnosis not present

## 2020-08-08 DIAGNOSIS — M6281 Muscle weakness (generalized): Secondary | ICD-10-CM

## 2020-08-08 NOTE — Therapy (Signed)
[facility] [address] [place], [place], [address] Phone: [phone]   Fax:  [phone]  Physical Therapy Treatment/ Progress Note/ Re-Cert/ Discharge   Patient Details  Name: Grace Alvarado MRN: [id number] Date of Birth: 21-Mar-1925 Referring Provider (PT): Charlann Boxer  Physical Therapy Progress Note  Dates of Reporting Period:   06/18/20 to 72/5/36  Re-Cert for date extension only. For todays visit. Pt ready for d/c.     Encounter Date: 08/08/2020   PT End of Session - 08/08/20 1359    Visit Number 9    Number of Visits 12    Date for PT Re-Evaluation 08/08/20    Authorization Type [employer]    PT Start Time 1215    PT Stop Time 1256    PT Time Calculation (min) 41 min    Equipment Utilized During Treatment Gait belt    Activity Tolerance Patient tolerated treatment well    Behavior During Therapy WFL for tasks assessed/performed           Past Medical History:  Diagnosis Date  . Atrial fibrillation ([place])   . Hyperlipidemia   . Hypertension   . Hypothyroidism   . Mitral regurgitation   . MVP (mitral valve prolapse)     Past Surgical History:  Procedure Laterality Date  . APPENDECTOMY    . c section    . CESAREAN SECTION    . CHOLECYSTECTOMY    . COLONOSCOPY WITH PROPOFOL N/A 11/09/2018   Procedure: COLONOSCOPY WITH PROPOFOL;  Surgeon: Doran Stabler, MD;  Location: [facility];  Service: Gastroenterology;  Laterality: N/A;  . ESOPHAGOGASTRODUODENOSCOPY (EGD) WITH PROPOFOL N/A 11/09/2018   Procedure: ESOPHAGOGASTRODUODENOSCOPY (EGD) WITH PROPOFOL;  Surgeon: Doran Stabler, MD;  Location: [facility];  Service: Gastroenterology;  Laterality: N/A;  . HOT HEMOSTASIS N/A 11/09/2018   Procedure: HOT HEMOSTASIS (ARGON PLASMA COAGULATION/BICAP);  Surgeon: Doran Stabler, MD;  Location: [facility];  Service: Gastroenterology;  Laterality: N/A;  . I & D EXTREMITY Left 03/02/2020   Procedure: evacuation of left knee  hematoma, possible wound vac placement;  Surgeon: Meredith Pel, MD;  Location: [facility];  Service: Orthopedics;  Laterality: Left;  . SUBMUCOSAL INJECTION  11/09/2018   Procedure: SUBMUCOSAL INJECTION;  Surgeon: Doran Stabler, MD;  Location: [facility];  Service: Gastroenterology;;    There were no vitals filed for this visit.   Subjective Assessment - 08/08/20 1358    Subjective Pt last seen 10/14. States she had a cold . Pt has been doing very well, has been doing HEP.    Currently in Pain? No/denies              [facility] PT Assessment - 08/08/20 1239      Strength   Overall Strength Comments hips: 4+/5;  Knees: 5/5       Berg Balance Test   Sit to Stand Able to stand without using hands and stabilize independently    Standing Unsupported Able to stand safely 2 minutes    Sitting with Back Unsupported but Feet Supported on Floor or Stool Able to sit safely and securely 2 minutes    Stand to Sit Sits safely with minimal use of hands    Transfers Able to transfer safely, minor use of hands    Standing Unsupported with Eyes Closed Able to stand 10 seconds safely    Standing Unsupported with Feet Together Able to place feet together independently and stand 1 minute safely  From Standing, Reach Forward with Outstretched Arm Can reach confidently >25 cm (10")    From Standing Position, Pick up Object from [place] to pick up shoe safely and easily    From Standing Position, Turn to Look Behind Over each Shoulder Looks behind one side only/other side shows less weight shift    Turn 360 Degrees Able to turn 360 degrees safely in 4 seconds or less    Standing Unsupported, Alternately Place Feet on Step/Stool Able to stand independently and complete 8 steps >20 seconds    Standing Unsupported, One Foot in Front Able to take small step independently and hold 30 seconds    Standing on One Leg Tries to lift leg/unable to hold 3 seconds but remains standing independently    Total  Score 49                         [facility] Adult PT Treatment/Exercise - 08/08/20 1239      Ambulation/Gait   Gait Comments 35 ft x 8 with SPC , x 6 with no AD (contact guard)       Exercises   Exercises Knee/Hip      Knee/Hip Exercises: Aerobic   Recumbent Bike --      Knee/Hip Exercises: Standing   Heel Raises 20 reps    Hip Flexion 20 reps;Knee bent    Hip Abduction 2 sets;10 reps;Both    Stairs up/down 5 steps x 6, with 1 HR , recipricol     Gait Training 35 ft x8 with RW;  x8 with no AD.     Other Standing Knee Exercises side stepping 15 ft x 4    Other Standing Knee Exercises staggered stance wight shifts x 20 bil; Lateral and posterior stepping with weight shifting x 20 ea, 1 UE support;        Knee/Hip Exercises: Seated   Long Arc Quad 20 reps    Long Arc Quad Limitations 2.5 lb    Sit to [employer] 10 reps                  PT Education - 08/08/20 1359    Education Details Final HEP reviewed    Person(s) Educated Patient    Methods Explanation;Demonstration;Verbal cues;Handout    Comprehension Verbalized understanding;Returned demonstration;Verbal cues required            PT Short Term Goals - 08/08/20 1400      PT SHORT TERM GOAL #1   Title Pt to be independent with initial HEP    Time 2    Period Weeks    Status Achieved    Target Date 07/02/20      PT SHORT TERM GOAL #2   Title Pt will demo optimal posture with use of RW , with improved fwd flexion, and improved shoulder position    Time 2    Period Weeks    Status Achieved    Target Date 07/02/20             PT Long Term Goals - 08/08/20 1401      PT LONG TERM GOAL #1   Title Pt to be independent with final HEP    Time 6    Period Weeks    Status Achieved      PT LONG TERM GOAL #2   Title Pt to be independent/safe with ambulation up to 500 ft with LRAD.    Time 6  Period Weeks    Status Achieved      PT LONG TERM GOAL #3   Title Pt to be independent/safe for  navigation of at least 5 steps with 1 HR ,recipricol pattern for improved independence in community    Time 6    Period Weeks    Status Achieved      PT LONG TERM GOAL #4   Title Pt to demo dynamic balance to be WNL for pt age, to improve safety with home and community navigation/ Improve BERG by at least 5 points    Time 6    Period Weeks    Status Achieved      PT LONG TERM GOAL #5   Title Pt to demo improved strength of bil LEs to be at least 4+/5 for improved stability and ambulation    Time 6    Period Weeks    Status Achieved                 Plan - 08/08/20 1402    Clinical Impression Statement Pt has made very good progress. She is doing very well with home mobility, and with HEP. Final HEP reviewed today. Pt with much improved balance and strength, and is safe for independent mobility. Pt safe for short distances with use of SPC, but will continue to use RW. Daughter would like pt to continue use of RW. Pt has met goals at this time and is ready for d/c to HEP. Pt in agreement with plan. Re-Cert done today, to extend date/ for todays visit.    Personal Factors and Comorbidities Age    Examination-Activity Limitations Locomotion Level;Squat;Stairs;Stand;Lift    Examination-Participation Restrictions Meal Prep;Cleaning;Community Activity    Stability/Clinical Decision Making Stable/Uncomplicated    Rehab Potential Good    PT Frequency 2x / week    PT Duration 6 weeks    PT Treatment/Interventions ADLs/Self Care Home Management;Cryotherapy;Electrical Stimulation;Gait training;DME Instruction;Ultrasound;Moist Heat;Iontophoresis 9m/ml Dexamethasone;Stair training;Functional mobility training;Therapeutic activities;Therapeutic exercise;Neuromuscular re-education;Balance training;Manual techniques;Orthotic Fit/Training;Patient/family education;Passive range of motion;Dry needling;Taping;Spinal Manipulations;Energy conservation    Consulted and Agree with Plan of Care Patient            Patient will benefit from skilled therapeutic intervention in order to improve the following deficits and impairments:  Abnormal gait, Decreased range of motion, Decreased endurance, Decreased safety awareness, Pain, Decreased activity tolerance, Decreased balance, Decreased knowledge of use of DME, Impaired flexibility, Improper body mechanics, Decreased mobility, Decreased strength  Visit Diagnosis: Other abnormalities of gait and mobility  Muscle weakness (generalized)     Problem List Patient Active Problem List   Diagnosis Date Noted  . Dark urine 07/31/2020  . Allergic rhinitis 07/03/2020  . Pressure injury of sacral region, stage 1 07/03/2020  . Aortic atherosclerosis ([place] 06/13/2020  . Hyponatremia 03/07/2020  . DNR (do not resuscitate) 03/07/2020  . Prepatellar bursitis, left knee 03/02/2020  . Neuropathy 01/24/2020  . Chronic back pain 01/24/2020  . Complaints of leg weakness 01/24/2020  . Compression fracture of T11 vertebra ([place] [place] 01/23/2020  . Leg pain 01/03/2020  . Chronic anticoagulation 01/03/2020  . Compression fracture of L1 lumbar vertebra ([place] 07/27/2019  . Low back pain 07/20/2019  . Hyperglycemia 05/01/2019  . Neck pain, musculoskeletal 11/16/2018  . Constipation 11/16/2018  . Hypokalemia   . AVM (arteriovenous malformation) of colon   . Lower GI bleed 11/06/2018  . Iron deficiency anemia 11/02/2018  . Lymphedema of left lower extremity 10/22/2018  . B12 deficiency 05/27/2018  . Dependent  edema 05/25/2018  . Pulmonary hypertension, primary ([facility]) 05/20/2018  . Dyslipidemia 05/20/2018  . CHF (congestive heart failure) ([place]) 05/20/2018  . Tricuspid regurgitation 05/17/2018  . Mitral regurgitation 05/17/2018  . Chronic a-fib 05/17/2018  . Essential hypertension 05/17/2018  . Hypothyroid 05/17/2018  . CAD (coronary artery disease) 05/17/2018  . Carotid atherosclerosis, bilateral 05/17/2018    Lyndee Hensen, PT, DPT 2:07 PM   08/08/20    [facility] [place], [place], [address] Phone: [phone]   Fax:  [phone]  Name: Grace Alvarado MRN: [id number] Date of Birth: [birth year]   PHYSICAL THERAPY DISCHARGE SUMMARY  Visits from Start of Care: 10  Plan: Patient agrees to discharge.  Patient goals were met. Patient is being discharged due to meeting the stated rehab goals.  ?????

## 2020-08-09 ENCOUNTER — Encounter: Payer: Self-pay | Admitting: Internal Medicine

## 2020-08-09 DIAGNOSIS — R300 Dysuria: Secondary | ICD-10-CM

## 2020-08-10 ENCOUNTER — Other Ambulatory Visit (INDEPENDENT_AMBULATORY_CARE_PROVIDER_SITE_OTHER): Payer: Medicare Other

## 2020-08-10 ENCOUNTER — Other Ambulatory Visit: Payer: Self-pay

## 2020-08-10 DIAGNOSIS — R300 Dysuria: Secondary | ICD-10-CM | POA: Diagnosis not present

## 2020-08-10 LAB — URINALYSIS, ROUTINE W REFLEX MICROSCOPIC
Bilirubin Urine: NEGATIVE
Ketones, ur: NEGATIVE
Leukocytes,Ua: NEGATIVE
Nitrite: NEGATIVE
Specific Gravity, Urine: 1.01 (ref 1.000–1.030)
Total Protein, Urine: NEGATIVE
Urine Glucose: NEGATIVE
Urobilinogen, UA: 0.2 (ref 0.0–1.0)
pH: 6.5 (ref 5.0–8.0)

## 2020-08-10 NOTE — Addendum Note (Signed)
Addended by: Miguel Aschoff on: 08/10/2020 02:02 PM   Modules accepted: Orders

## 2020-08-11 LAB — URINE CULTURE: Result:: NO GROWTH

## 2020-08-13 ENCOUNTER — Ambulatory Visit: Payer: Medicare Other

## 2020-09-03 ENCOUNTER — Ambulatory Visit: Payer: Medicare Other | Admitting: Podiatry

## 2020-09-05 ENCOUNTER — Ambulatory Visit: Payer: Medicare Other | Admitting: Internal Medicine

## 2020-09-06 ENCOUNTER — Encounter: Payer: Self-pay | Admitting: Internal Medicine

## 2020-09-06 NOTE — Progress Notes (Deleted)
Subjective:    Patient ID: Grace Alvarado, female    DOB: 1925/01/01, 86 y.o.   MRN: [id number]  HPI The patient is here for an acute visit/follow up of sacral ulcer..     Medications and allergies reviewed with patient and updated if appropriate.  Patient Active Problem List   Diagnosis Date Noted  . Dark urine 07/31/2020  . Allergic rhinitis 07/03/2020  . Pressure injury of sacral region, stage 1 07/03/2020  . Aortic atherosclerosis ([facility]) 06/13/2020  . Hyponatremia 03/07/2020  . DNR (do not resuscitate) 03/07/2020  . Prepatellar bursitis, left knee 03/02/2020  . Neuropathy 01/24/2020  . Chronic back pain 01/24/2020  . Complaints of leg weakness 01/24/2020  . Compression fracture of T11 vertebra ([facility]) 01/23/2020  . Leg pain 01/03/2020  . Chronic anticoagulation 01/03/2020  . Compression fracture of L1 lumbar vertebra ([facility]) 07/27/2019  . Low back pain 07/20/2019  . Hyperglycemia 05/01/2019  . Neck pain, musculoskeletal 11/16/2018  . Constipation 11/16/2018  . Hypokalemia   . AVM (arteriovenous malformation) of colon   . Lower GI bleed 11/06/2018  . Iron deficiency anemia 11/02/2018  . Lymphedema of left lower extremity 10/22/2018  . B12 deficiency 05/27/2018  . Dependent edema 05/25/2018  . Pulmonary hypertension, primary ([facility]) 05/20/2018  . Dyslipidemia 05/20/2018  . CHF (congestive heart failure) ([facility]) 05/20/2018  . Tricuspid regurgitation 05/17/2018  . Mitral regurgitation 05/17/2018  . Chronic a-fib 05/17/2018  . Essential hypertension 05/17/2018  . Hypothyroid 05/17/2018  . CAD (coronary artery disease) 05/17/2018  . Carotid atherosclerosis, bilateral 05/17/2018    Current Outpatient Medications on File Prior to Visit  Medication Sig Dispense Refill  . acetaminophen (TYLENOL) 325 MG tablet Take 1-2 tablets (325-650 mg total) by mouth every 6 (six) hours as needed for mild pain (pain score 1-3 or temp > 100.5).    Marland Kitchen acidophilus (RISAQUAD) CAPS capsule Take  1 capsule by mouth daily.    . B Complex-C (B-COMPLEX WITH VITAMIN C) tablet Take 1 tablet by mouth daily.    Marland Kitchen diltiazem (CARDIZEM SR) 120 MG 12 hr capsule Take 1 capsule (120 mg total) by mouth 2 (two) times daily. 180 capsule 3  . docusate sodium (COLACE) 100 MG capsule Take 1 capsule (100 mg total) by mouth daily. Hold for loose bowel movements 60 capsule 0  . furosemide (LASIX) 20 MG tablet Take 1 tablet (20 mg total) by mouth every other day. 45 tablet 3  . Infant Care Products (DERMACLOUD) CREA Apply 1 application topically in the morning and at bedtime. Apply to buttocks.    Marland Kitchen levothyroxine (SYNTHROID) 75 MCG tablet Take 1 tablet (75 mcg total) by mouth daily before breakfast. 90 tablet 1  . polyethylene glycol (MIRALAX) 17 g packet Take 17 g by mouth daily. Hold for loose bowel movements 60 each 0  . potassium chloride (KLOR-CON) 10 MEQ tablet Take 1 tablet (10 mEq total) by mouth every other day. 45 tablet 3  . pravastatin (PRAVACHOL) 10 MG tablet Take 1 tablet (10 mg total) by mouth daily. 90 tablet 3  . Rivaroxaban (XARELTO) 15 MG TABS tablet Take 1 tablet (15 mg total) by mouth daily with supper. 90 tablet 3  . sulfamethoxazole-trimethoprim (BACTRIM DS) 800-160 MG tablet Take 1 tablet by mouth 2 (two) times daily. 10 tablet 0  . Vitamin D, Ergocalciferol, (DRISDOL) 1.25 MG (50000 UNIT) CAPS capsule Take 1 capsule (50,000 Units total) by mouth every 7 (seven) days. (Patient taking differently: Take 50,000 Units by mouth  every 7 (seven) days. Thursday) 8 capsule 0  . [DISCONTINUED] metoprolol tartrate (LOPRESSOR) 25 MG tablet Take 0.5 tablets (12.5 mg total) by mouth 2 (two) times daily. (Patient not taking: Reported on 04/07/2020)     No current facility-administered medications on file prior to visit.    Past Medical History:  Diagnosis Date  . Atrial fibrillation ([facility])   . Hyperlipidemia   . Hypertension   . Hypothyroidism   . Mitral regurgitation   . MVP (mitral valve prolapse)      Past Surgical History:  Procedure Laterality Date  . APPENDECTOMY    . c section    . CESAREAN SECTION    . CHOLECYSTECTOMY    . COLONOSCOPY WITH PROPOFOL N/A 11/09/2018   Procedure: COLONOSCOPY WITH PROPOFOL;  Surgeon: Sherrilyn Rist, MD;  Location: [facility] ENDOSCOPY;  Service: Gastroenterology;  Laterality: N/A;  . ESOPHAGOGASTRODUODENOSCOPY (EGD) WITH PROPOFOL N/A 11/09/2018   Procedure: ESOPHAGOGASTRODUODENOSCOPY (EGD) WITH PROPOFOL;  Surgeon: Sherrilyn Rist, MD;  [facility]COPY;  Service: Gastroenterology;  Laterality: N/A;  . HOT HEMOSTASIS N/A 11/09/2018   Procedure: HOT HEMOSTASIS (ARGON PLASMA COAGULATION/BICAP);  Surgeon: Sherrilyn Rist, MD;  Location: [facility] ENDOSCOPY;  Service: Gastroenterology;  Laterality: N/A;  . I & D EXTREMITY Left 03/02/2020   Procedure: evacuation of left knee hematoma, possible wound vac placement;  Surgeon: Cammy Copa, MD;  Location: [facility] OR;  Service: Orthopedics;  Laterality: Left;  . SUBMUCOSAL INJECTION  11/09/2018   Procedure: SUBMUCOSAL INJECTION;  Surgeon: Sherrilyn Rist, MD;  Location: [facility] ENDOSCOPY;  Service: Gastroenterology;;    Social History   Socioeconomic History  . Marital status: Widowed    Spouse name: Not on file  . Number of children: 3  . Years of education: Not on file  . Highest education level: Not on file  Occupational History  . Occupation: retired  Tobacco Use  . Smoking status: Former Games developer  . Smokeless tobacco: Never Used  Vaping Use  . Vaping Use: Never used  Substance and Sexual Activity  . Alcohol use: Not Currently  . Drug use: Never  . Sexual activity: Not Currently  Other Topics Concern  . Not on file  Social History Narrative  . Not on file   Social Determinants of Health   Financial Resource Strain:   . Difficulty of Paying Living Expenses: Not on file  Food Insecurity:   . Worried About Programme researcher, broadcasting/film/video in the Last Year: Not on file  . Ran Out of Food in the Last Year: Not  on file  Transportation Needs:   . Lack of Transportation (Medical): Not on file  . Lack of Transportation (Non-Medical): Not on file  Physical Activity:   . Days of Exercise per Week: Not on file  . Minutes of Exercise per Session: Not on file  Stress:   . Feeling of Stress : Not on file  Social Connections:   . Frequency of Communication with Friends and Family: Not on file  . Frequency of Social Gatherings with Friends and Family: Not on file  . Attends Religious Services: Not on file  . Active Member of Clubs or Organizations: Not on file  . Attends Banker Meetings: Not on file  . Marital Status: Not on file    Family History  Problem Relation Age of Onset  . Cancer Mother   . Dementia Mother     Review of Systems     Objective:  There were no vitals filed for this visit. BP Readings from Last 3 Encounters:  07/31/20 118/78  07/03/20 122/82  06/13/20 118/70   Wt Readings from Last 3 Encounters:  07/31/20 131 lb (59.4 kg)  07/03/20 131 lb 6.4 oz (59.6 kg)  06/13/20 132 lb 6.4 oz (60.1 kg)   There is no height or weight on file to calculate BMI.   Physical Exam         Assessment & Plan:    See Problem List for Assessment and Plan of chronic medical problems.    This visit occurred during the SARS-CoV-2 public health emergency.  Safety protocols were in place, including screening questions prior to the visit, additional usage of staff PPE, and extensive cleaning of exam room while observing appropriate contact time as indicated for disinfecting solutions.

## 2020-09-07 ENCOUNTER — Ambulatory Visit: Payer: Medicare Other | Admitting: Internal Medicine

## 2020-09-16 NOTE — Progress Notes (Signed)
Subjective:    Patient ID: Grace Alvarado, female    DOB: 20-Jul-1925, 86 y.o.   MRN: [id number]  HPI The patient is here for an acute visit/follow up of sacral ulcer..  She still has soreness in the sacral region.  Some days are good days and she does not feel any discomfort at all and some days she has pain.  She denies any discharge or open sores as far she is aware.  She is using like a baby ointment on the area.  She has offloaded pressure by using a donut pillow.  She typically sleeps on her side.  She denies any fevers or chills.  She is drinking 2 protein drinks a day.  For a week she was experiencing some left upper quadrant pain.  She did have this in the past and Dr. Katrinka Blazing downstairs told her to take MiraLAX and she thinks that helped.  She does have chronic constipation and takes Metamucil every day.  She does not like taking the Metamucil and takes a very small dose.  She states the pain has gone away at this time.  She did not feel constipated at the time.  She denies any relation to gas, eating or heartburn.  She does have some dark urine and was concerned about the possibility of still having a UTI.  We did have to treat her recently for 1.  She would like to have her urine checked.  Due for B12 injection.   Medications and allergies reviewed with patient and updated if appropriate.  Patient Active Problem List   Diagnosis Date Noted  . Dark urine 07/31/2020  . Allergic rhinitis 07/03/2020  . Pressure injury of sacral region, stage 1 07/03/2020  . Aortic atherosclerosis (HCC) 06/13/2020  . Hyponatremia 03/07/2020  . DNR (do not resuscitate) 03/07/2020  . Prepatellar bursitis, left knee 03/02/2020  . Neuropathy 01/24/2020  . Chronic back pain 01/24/2020  . Complaints of leg weakness 01/24/2020  . Compression fracture of T11 vertebra (HCC) 01/23/2020  . Leg pain 01/03/2020  . Chronic anticoagulation 01/03/2020  . Compression fracture of L1 lumbar vertebra (HCC)  07/27/2019  . Low back pain 07/20/2019  . Hyperglycemia 05/01/2019  . Neck pain, musculoskeletal 11/16/2018  . Constipation 11/16/2018  . Hypokalemia   . AVM (arteriovenous malformation) of colon   . Lower GI bleed 11/06/2018  . Iron deficiency anemia 11/02/2018  . Lymphedema of left lower extremity 10/22/2018  . B12 deficiency 05/27/2018  . Dependent edema 05/25/2018  . Pulmonary hypertension, primary (HCC) 05/20/2018  . Dyslipidemia 05/20/2018  . CHF (congestive heart failure) (HCC) 05/20/2018  . Tricuspid regurgitation 05/17/2018  . Mitral regurgitation 05/17/2018  . Chronic a-fib 05/17/2018  . Essential hypertension 05/17/2018  . Hypothyroid 05/17/2018  . CAD (coronary artery disease) 05/17/2018  . Carotid atherosclerosis, bilateral 05/17/2018    Current Outpatient Medications on File Prior to Visit  Medication Sig Dispense Refill  . acetaminophen (TYLENOL) 325 MG tablet Take 1-2 tablets (325-650 mg total) by mouth every 6 (six) hours as needed for mild pain (pain score 1-3 or temp > 100.5).    Marland Kitchen acidophilus (RISAQUAD) CAPS capsule Take 1 capsule by mouth daily.    . B Complex-C (B-COMPLEX WITH VITAMIN C) tablet Take 1 tablet by mouth daily.    Marland Kitchen diltiazem (CARDIZEM SR) 120 MG 12 hr capsule Take 1 capsule (120 mg total) by mouth 2 (two) times daily. 180 capsule 3  . docusate sodium (COLACE) 100 MG capsule Take 1  capsule (100 mg total) by mouth daily. Hold for loose bowel movements 60 capsule 0  . furosemide (LASIX) 20 MG tablet Take 1 tablet (20 mg total) by mouth every other day. 45 tablet 3  . Infant Care Products (DERMACLOUD) CREA Apply 1 application topically in the morning and at bedtime. Apply to buttocks.    Marland Kitchen levothyroxine (SYNTHROID) 75 MCG tablet Take 1 tablet (75 mcg total) by mouth daily before breakfast. 90 tablet 1  . polyethylene glycol (MIRALAX) 17 g packet Take 17 g by mouth daily. Hold for loose bowel movements 60 each 0  . potassium chloride (KLOR-CON) 10 MEQ  tablet Take 1 tablet (10 mEq total) by mouth every other day. 45 tablet 3  . pravastatin (PRAVACHOL) 10 MG tablet Take 1 tablet (10 mg total) by mouth daily. 90 tablet 3  . Rivaroxaban (XARELTO) 15 MG TABS tablet Take 1 tablet (15 mg total) by mouth daily with supper. 90 tablet 3  . sulfamethoxazole-trimethoprim (BACTRIM DS) 800-160 MG tablet Take 1 tablet by mouth 2 (two) times daily. 10 tablet 0  . Vitamin D, Ergocalciferol, (DRISDOL) 1.25 MG (50000 UNIT) CAPS capsule Take 1 capsule (50,000 Units total) by mouth every 7 (seven) days. (Patient taking differently: Take 50,000 Units by mouth every 7 (seven) days. Thursday) 8 capsule 0  . [DISCONTINUED] metoprolol tartrate (LOPRESSOR) 25 MG tablet Take 0.5 tablets (12.5 mg total) by mouth 2 (two) times daily. (Patient not taking: Reported on 04/07/2020)     No current facility-administered medications on file prior to visit.    Past Medical History:  Diagnosis Date  . Atrial fibrillation (HCC)   . Hyperlipidemia   . Hypertension   . Hypothyroidism   . Mitral regurgitation   . MVP (mitral valve prolapse)     Past Surgical History:  Procedure Laterality Date  . APPENDECTOMY    . c section    . CESAREAN SECTION    . CHOLECYSTECTOMY    . COLONOSCOPY WITH PROPOFOL N/A 11/09/2018   Procedure: COLONOSCOPY WITH PROPOFOL;  Surgeon: Sherrilyn Rist, MD;  Location: [facility] ENDOSCOPY;  Service: Gastroenterology;  Laterality: N/A;  . ESOPHAGOGASTRODUODENOSCOPY (EGD) WITH PROPOFOL N/A 11/09/2018   Procedure: ESOPHAGOGASTRODUODENOSCOPY (EGD) WITH PROPOFOL;  Surgeon: Sherrilyn Rist, MD;  Location: [facility] ENDOSCOPY;  Service: Gastroenterology;  Laterality: N/A;  . HOT HEMOSTASIS N/A 11/09/2018   Procedure: HOT HEMOSTASIS (ARGON PLASMA COAGULATION/BICAP);  Surgeon: Sherrilyn Rist, MD;  Location: [facility] ENDOSCOPY;  Service: Gastroenterology;  Laterality: N/A;  . I & D EXTREMITY Left 03/02/2020   Procedure: evacuation of left knee hematoma, possible wound vac  placement;  Surgeon: Cammy Copa, MD;  Location: [facility] OR;  Service: Orthopedics;  Laterality: Left;  . SUBMUCOSAL INJECTION  11/09/2018   Procedure: SUBMUCOSAL INJECTION;  Surgeon: Sherrilyn Rist, MD;  Location: [facility] ENDOSCOPY;  Service: Gastroenterology;;    Social History   Socioeconomic History  . Marital status: Widowed    Spouse name: Not on file  . Number of children: 3  . Years of education: Not on file  . Highest education level: Not on file  Occupational History  . Occupation: retired  Tobacco Use  . Smoking status: Former Games developer  . Smokeless tobacco: Never Used  Vaping Use  . Vaping Use: Never used  Substance and Sexual Activity  . Alcohol use: Not Currently  . Drug use: Never  . Sexual activity: Not Currently  Other Topics Concern  . Not on file  Social History  Narrative  . Not on file   Social Determinants of Health   Financial Resource Strain: Not on file  Food Insecurity: Not on file  Transportation Needs: Not on file  Physical Activity: Not on file  Stress: Not on file  Social Connections: Not on file    Family History  Problem Relation Age of Onset  . Cancer Mother   . Dementia Mother     Review of Systems  Constitutional: Negative for fever.  Respiratory: Negative for cough and shortness of breath.   Gastrointestinal: Positive for constipation. Negative for abdominal pain.  Skin: Positive for wound.       Objective:   Vitals:   09/17/20 1438  BP: 116/68  Pulse: 100  Temp: 97.9 F (36.6 C)  SpO2: 98%   BP Readings from Last 3 Encounters:  09/17/20 116/68  07/31/20 118/78  07/03/20 122/82   Wt Readings from Last 3 Encounters:  09/17/20 132 lb (59.9 kg)  07/31/20 131 lb (59.4 kg)  07/03/20 131 lb 6.4 oz (59.6 kg)   Body mass index is 23.38 kg/m.   Physical Exam Constitutional:      General: She is not in acute distress.    Appearance: Normal appearance. She is not ill-appearing.  HENT:     Head: Normocephalic and  atraumatic.  Skin:    General: Skin is warm and dry.     Comments: Pilonidal cyst without discharge with minimal surrounding erythema approximately the size of a quarter.  No other erythema.  No skin breakage or other lesions  Neurological:     Mental Status: She is alert.            Assessment & Plan:    See Problem List for Assessment and Plan of chronic medical problems.    This visit occurred during the SARS-CoV-2 public health emergency.  Safety protocols were in place, including screening questions prior to the visit, additional usage of staff PPE, and extensive cleaning of exam room while observing appropriate contact time as indicated for disinfecting solutions.

## 2020-09-17 ENCOUNTER — Encounter: Payer: Self-pay | Admitting: Internal Medicine

## 2020-09-17 ENCOUNTER — Ambulatory Visit (INDEPENDENT_AMBULATORY_CARE_PROVIDER_SITE_OTHER): Payer: Medicare Other | Admitting: Internal Medicine

## 2020-09-17 ENCOUNTER — Other Ambulatory Visit: Payer: Self-pay

## 2020-09-17 VITALS — BP 116/68 | HR 100 | Temp 97.9°F | Ht 63.0 in | Wt 132.0 lb

## 2020-09-17 DIAGNOSIS — R300 Dysuria: Secondary | ICD-10-CM | POA: Diagnosis not present

## 2020-09-17 DIAGNOSIS — E538 Deficiency of other specified B group vitamins: Secondary | ICD-10-CM | POA: Diagnosis not present

## 2020-09-17 DIAGNOSIS — R82998 Other abnormal findings in urine: Secondary | ICD-10-CM | POA: Diagnosis not present

## 2020-09-17 DIAGNOSIS — L0591 Pilonidal cyst without abscess: Secondary | ICD-10-CM | POA: Diagnosis not present

## 2020-09-17 DIAGNOSIS — R1012 Left upper quadrant pain: Secondary | ICD-10-CM | POA: Insufficient documentation

## 2020-09-17 MED ORDER — DOXYCYCLINE HYCLATE 100 MG PO TABS: 100.0000 mg | ORAL_TABLET | Freq: Two times a day (BID) | ORAL | 0 refills | Status: AC

## 2020-09-17 MED ORDER — CYANOCOBALAMIN 1000 MCG/ML IJ SOLN
1000.0000 ug | Freq: Once | INTRAMUSCULAR | Status: AC
  Administered 2020-09-17: 1000 ug via INTRAMUSCULAR

## 2020-09-17 NOTE — Assessment & Plan Note (Signed)
Chronic The area of pain is at the pilonidal cyst.  She has minimal amount of erythema surrounding it, but no other redness.  The slight degree of pressure ulcer she did have looks to have resolved Now I think the only pain she is having is from the pilonidal cyst There is no discharge Discussed seeing surgery-she declined ?  Low-grade infection-we will prescribe doxycycline 100 mg twice daily x1 week to see if this helps

## 2020-09-17 NOTE — Assessment & Plan Note (Signed)
Chronic On monthly B12 injections Injection today-given   continue monthly B12 injections

## 2020-09-17 NOTE — Assessment & Plan Note (Signed)
Acute Had a recent urinary tract infection Is experiencing dark urine, but no dysuria or hematuria We will check urinalysis, urine culture-she will bring back a sample

## 2020-09-17 NOTE — Patient Instructions (Addendum)
Take the doxycycline twice daily for one week.     I think your pain is coming from the pilonidal cyst.  Your surrounding skin looks good.     If you decide you want to see a surgeon please let me know   If your left upper quadrant pain recurs let me know - this could be from constipation or gas.      Pilonidal Cyst  A pilonidal cyst is a fluid-filled sac that forms beneath the skin near the tailbone, at the top of the crease of the buttocks (pilonidal area). If the cyst is not large and not infected, it may not cause any problems. If the cyst becomes irritated or infected, it may get larger and fill with pus. An infected cyst is called an abscess. A pilonidal abscess may cause pain and swelling, and it may need to be drained or removed. What are the causes? The cause of this condition is not always known. In some cases, a hair that grows into your skin (ingrown hair) may be the cause. What increases the risk? You are more likely to get a pilonidal cyst if you:  Are female.  Have lots of hair near the crease of the buttocks.  Are overweight.  Have a dimple near the crease of the buttocks.  Wear tight clothing.  Do not bathe or shower often.  Sit for long periods of time. What are the signs or symptoms? Signs and symptoms of a pilonidal cyst may include pain, swelling, redness, and warmth in the pilonidal area. Depending on how big the cyst is, you may be able to feel a lump near your tailbone. If your cyst becomes infected, symptoms may include:  Pus or fluid drainage.  Fever.  Pain, swelling, and redness getting worse.  The lump getting bigger. How is this diagnosed? This condition may be diagnosed based on:  Your symptoms and medical history.  A physical exam.  A blood test to check for infection.  Testing a pus sample, if applicable. How is this treated? If your cyst does not cause symptoms, you may not need any treatment. If your cyst bothers you or is  infected, you may need a procedure to drain or remove the cyst. Depending on the size, location, and severity of your cyst, your health care provider may:  Make an incision in the cyst and drain it (incision and drainage).  Open and drain the cyst, and then stitch the wound so that it stays open while it heals (marsupialization). You will be given instructions about how to care for your open wound while it heals.  Remove all or part of the cyst, and then close the wound (cyst removal). You may need to take antibiotic medicines before your procedure. Follow these instructions at home: Medicines  Take over-the-counter and prescription medicines only as told by your health care provider.  If you were prescribed an antibiotic medicine, take it as told by your health care provider. Do not stop taking the antibiotic even if you start to feel better. General instructions  Keep the area around your pilonidal cyst clean and dry.  If there is fluid or pus draining from your cyst: ? Cover the area with a clean bandage (dressing) as needed. ? Wash the area gently with soap and water. Pat the area dry with a clean towel. Do not rub the area because that may cause bleeding.  Remove hair from the area around the cyst only if your health care provider  tells you to do this.  Do not wear tight pants or sit in one position for long periods at a time.  Keep all follow-up visits as told by your health care provider. This is important. Contact a health care provider if you have:  New redness, swelling, or pain.  A fever.  Severe pain. Summary  A pilonidal cyst is a fluid-filled sac that forms beneath the skin near the tailbone, at the top of the crease of the buttocks (pilonidal area).  If the cyst becomes irritated or infected, it may get larger and fill with pus. An infected cyst is called an abscess.  The cause of this condition is not always known. In some cases, a hair that grows into your  skin (ingrown hair) may be the cause.  If your cyst does not cause symptoms, you may not need any treatment. If your cyst bothers you or is infected, you may need a procedure to drain or remove the cyst. This information is not intended to replace advice given to you by your health care provider. Make sure you discuss any questions you have with your health care provider. Document Revised: 09/10/2017 Document Reviewed: 09/10/2017 Elsevier Patient Education  2020 [employer].

## 2020-09-18 LAB — URINALYSIS, ROUTINE W REFLEX MICROSCOPIC
Bilirubin Urine: NEGATIVE
Hgb urine dipstick: NEGATIVE
Ketones, ur: NEGATIVE
Nitrite: NEGATIVE
Specific Gravity, Urine: 1.01 (ref 1.000–1.030)
Total Protein, Urine: NEGATIVE
Urine Glucose: NEGATIVE
Urobilinogen, UA: 0.2 (ref 0.0–1.0)
pH: 6.5 (ref 5.0–8.0)

## 2020-09-18 NOTE — Addendum Note (Signed)
Addended by: Miguel Aschoff on: 09/18/2020 10:01 AM   Modules accepted: Orders

## 2020-09-19 LAB — URINE CULTURE

## 2020-09-20 ENCOUNTER — Other Ambulatory Visit: Payer: Self-pay

## 2020-09-20 ENCOUNTER — Ambulatory Visit (INDEPENDENT_AMBULATORY_CARE_PROVIDER_SITE_OTHER): Payer: Medicare Other | Admitting: Podiatry

## 2020-09-20 DIAGNOSIS — B351 Tinea unguium: Secondary | ICD-10-CM

## 2020-09-20 DIAGNOSIS — M79675 Pain in left toe(s): Secondary | ICD-10-CM

## 2020-09-20 DIAGNOSIS — Z7901 Long term (current) use of anticoagulants: Secondary | ICD-10-CM

## 2020-09-20 DIAGNOSIS — M79674 Pain in right toe(s): Secondary | ICD-10-CM | POA: Diagnosis not present

## 2020-09-20 NOTE — Progress Notes (Signed)
Subjective: 86 y.o. returns the office today for painful, elongated, thickened toenails which she cannot trim herself. Denies any redness or drainage around the nails. Denies any acute changes since last appointment and no new complaints today. Denies any systemic complaints such as fevers, chills, nausea, vomiting.   PCP: Pincus Sanes, MD Last seen 09/17/2020  Objective: NAD- presents with caregiver DP/PT pulses palpable, CRT less than 3 seconds Nails hypertrophic, dystrophic, elongated, brittle, discolored 10. There is tenderness overlying the nails 1-5 on the left and 1-5 on the right. There is no surrounding erythema or drainage along the nail sites. Minimal hyperkeratotic tissue medial first MPJ.  No ulcerations identified No open lesions or pre-ulcerative lesions are identified. No other areas of tenderness bilateral lower extremities. No overlying edema, erythema, increased warmth. No pain with calf compression, swelling, warmth, erythema.  Assessment: Patient presents with symptomatic onychomycosis  Plan: -Treatment options including alternatives, risks, complications were discussed -Nails sharply debrided x 10 without complication/bleeding. -Moisturizer daily to the calluses.  No significant callus or debried. -Discussed daily foot inspection. If there are any changes, to call the office immediately.  -Follow-up in 3 months or sooner if any problems are to arise. In the meantime, encouraged to call the office with any questions, concerns, changes symptoms.  Ovid Curd, DPM

## 2020-09-26 ENCOUNTER — Encounter: Payer: Self-pay | Admitting: Internal Medicine

## 2020-09-27 ENCOUNTER — Other Ambulatory Visit: Payer: Self-pay

## 2020-09-27 ENCOUNTER — Ambulatory Visit (INDEPENDENT_AMBULATORY_CARE_PROVIDER_SITE_OTHER): Payer: Medicare Other | Admitting: Internal Medicine

## 2020-09-27 ENCOUNTER — Encounter: Payer: Self-pay | Admitting: Internal Medicine

## 2020-09-27 ENCOUNTER — Ambulatory Visit (INDEPENDENT_AMBULATORY_CARE_PROVIDER_SITE_OTHER): Payer: Medicare Other

## 2020-09-27 VITALS — BP 116/72 | HR 98 | Temp 98.2°F | Ht 63.0 in | Wt 131.0 lb

## 2020-09-27 DIAGNOSIS — R0781 Pleurodynia: Secondary | ICD-10-CM

## 2020-09-27 NOTE — Progress Notes (Signed)
   Subjective:   Patient ID: Grace Alvarado, female    DOB: 06-25-1925, 86 y.o.   MRN: [id number]  HPI The patient is a 86 YO female coming in for about 2 weeks of intermittent left chest wall pain. Denies known cause or injury/overuse. She does exercises but cannot recall change in these. Denies fevers or chills. Denies constipation since adjusting metamucil. Denies diarrhea, nausea, vomiting. Denies pain with walking. Does have worsening pain with bending. Denies SOB or cough. Denies pain in the left arm or jaw. Overall stable. Has not tried anything for it.   Review of Systems  Constitutional: Negative.   HENT: Negative.   Eyes: Negative.   Respiratory: Negative for cough, chest tightness and shortness of breath.   Cardiovascular: Positive for chest pain. Negative for palpitations and leg swelling.  Gastrointestinal: Negative for abdominal distention, abdominal pain, constipation, diarrhea, nausea and vomiting.  Musculoskeletal: Positive for myalgias.  Skin: Negative.   Neurological: Negative.   Psychiatric/Behavioral: Negative.     Objective:  Physical Exam Constitutional:      Appearance: She is well-developed and well-nourished.  HENT:     Head: Normocephalic and atraumatic.  Eyes:     Extraocular Movements: EOM normal.  Cardiovascular:     Rate and Rhythm: Normal rate and regular rhythm.  Pulmonary:     Effort: Pulmonary effort is normal. No respiratory distress.     Breath sounds: Normal breath sounds. No wheezing or rales.     Comments: Chest wall tenderness lower left ribcage, no abdominal pain or tenderness.  Chest:     Chest wall: Tenderness present.  Abdominal:     General: Bowel sounds are normal. There is no distension.     Palpations: Abdomen is soft.     Tenderness: There is no abdominal tenderness. There is no rebound.  Musculoskeletal:        General: No edema.     Cervical back: Normal range of motion.  Skin:    General: Skin is warm and dry.   Neurological:     Mental Status: She is alert and oriented to person, place, and time.     Coordination: Coordination normal.  Psychiatric:        Mood and Affect: Mood and affect normal.     Vitals:   09/27/20 1038  BP: 116/72  Pulse: 98  Temp: 98.2 F (36.8 C)  TempSrc: Oral  SpO2: 99%  Weight: 131 lb (59.4 kg)  Height: 5\' 3"  (1.6 m)    This visit occurred during the SARS-CoV-2 public health emergency.  Safety protocols were in place, including screening questions prior to the visit, additional usage of staff PPE, and extensive cleaning of exam room while observing appropriate contact time as indicated for disinfecting solutions.   Assessment & Plan:

## 2020-09-27 NOTE — Assessment & Plan Note (Addendum)
Ordered x-ray to rule out fracture. She does have past T11 compression fracture which could contribute. Does not sound clinically or appear to be shingles. Advised to use icy hot or voltaren gel otc for pain topically. Can continue tylenol at night time for pain. This is likely muscular pain.

## 2020-09-27 NOTE — Patient Instructions (Signed)
We will check the x-ray today. ?

## 2020-10-03 ENCOUNTER — Encounter: Payer: Self-pay | Admitting: Internal Medicine

## 2020-10-04 NOTE — Progress Notes (Signed)
Grace Alvarado [address] [place] [address] Phone: [phone] Subjective:   I Grace Alvarado am serving as a Neurosurgeon for Dr. Antoine Primas.  This visit occurred during the SARS-CoV-2 public health emergency.  Safety protocols were in place, including screening questions prior to the visit, additional usage of staff PPE, and extensive cleaning of exam room while observing appropriate contact time as indicated for disinfecting solutions.   I'm seeing this patient by the request  of:  Pincus Sanes, MD  CC: Left-sided rib pain  [id number]   02/20/2020 Chronic back pain with history of compression fractures.  Patient continues to have pain.  Encouraged her to continue the once weekly vitamin D almost indefinitely.  Discussed the iron supplementation.  Due to patient's age and comorbidities very concerned to start other medications at this time.  Patient has not responded well to certain 1 such as gabapentin in the past.  Patient given a trial of topical anti-inflammatories but she is concerned about this.  We discussed that patient's abdominal pain could be secondary to more of a constipation and we discussed MiraLAX and Colace.  History of an AVM of the colon and encouraged her to consider potentially working up with the gastroenterologist.  Patient will follow up with me again in 4 to 8 weeks.  Total time with patient with reviewing the chart as well as discussing with her and her daughter greater than 31 minutes  Update 10/04/2020 Grace Alvarado is a 86 y.o. female coming in with complaint of rib and back pain. Patient states she has left sided rib pain. Using the left arm and certain movements causes pain. Taking tylenol 1 every 4 hours. Not sure if it is helping. Using icy hot, lidocaine patch. Pain initially started in her chest and move outward to the left side under her breast. TTP. Doesn't remember the MOI.  Patient does state since she did see Dr.  Okey Dupre it has been improving.  Now only sharp pain that lasts seconds with certain movements but then seems to get better.    Past Medical History:  Diagnosis Date  . Atrial fibrillation (HCC)   . Hyperlipidemia   . Hypertension   . Hypothyroidism   . Mitral regurgitation   . MVP (mitral valve prolapse)    Past Surgical History:  Procedure Laterality Date  . APPENDECTOMY    . c section    . CESAREAN SECTION    . CHOLECYSTECTOMY    . COLONOSCOPY WITH PROPOFOL N/A 11/09/2018   Procedure: COLONOSCOPY WITH PROPOFOL;  Surgeon: Sherrilyn Rist, MD;  Location: [facility] ENDOSCOPY;  Service: Gastroenterology;  Laterality: N/A;  . ESOPHAGOGASTRODUODENOSCOPY (EGD) WITH PROPOFOL N/A 11/09/2018   Procedure: ESOPHAGOGASTRODUODENOSCOPY (EGD) WITH PROPOFOL;  Surgeon: Sherrilyn Rist, MD;  Location: [facility] ENDOSCOPY;  Service: Gastroenterology;  Laterality: N/A;  . HOT HEMOSTASIS N/A 11/09/2018   Procedure: HOT HEMOSTASIS (ARGON PLASMA COAGULATION/BICAP);  Surgeon: Sherrilyn Rist, MD;  Location: [facility] ENDOSCOPY;  Service: Gastroenterology;  Laterality: N/A;  . I & D EXTREMITY Left 03/02/2020   Procedure: evacuation of left knee hematoma, possible wound vac placement;  Surgeon: Cammy Copa, MD;  Location: [facility] OR;  Service: Orthopedics;  Laterality: Left;  . SUBMUCOSAL INJECTION  11/09/2018   Procedure: SUBMUCOSAL INJECTION;  Surgeon: Sherrilyn Rist, MD;  Location: [facility] ENDOSCOPY;  Service: Gastroenterology;;   Social History   Socioeconomic History  . Marital status: Widowed    Spouse name: Not  on file  . Number of children: 3  . Years of education: Not on file  . Highest education level: Not on file  Occupational History  . Occupation: retired  Tobacco Use  . Smoking status: Former Games developer  . Smokeless tobacco: Never Used  Vaping Use  . Vaping Use: Never used  Substance and Sexual Activity  . Alcohol use: Not Currently  . Drug use: Never  . Sexual activity: Not Currently  Other Topics  Concern  . Not on file  Social History Narrative  . Not on file   Social Determinants of Health   Financial Resource Strain: Not on file  Food Insecurity: Not on file  Transportation Needs: Not on file  Physical Activity: Not on file  Stress: Not on file  Social Connections: Not on file   Allergies  Allergen Reactions  . Codeine Other (See Comments)    unknown  . Penicillins Other (See Comments)    unknown  . Vancomycin Other (See Comments)    unknown  . Zithromax [Azithromycin] Other (See Comments)    unknown  . Latex Rash   Family History  Problem Relation Age of Onset  . Cancer Mother   . Dementia Mother     Current Outpatient Medications (Endocrine & Metabolic):  .  levothyroxine (SYNTHROID) 75 MCG tablet, Take 1 tablet (75 mcg total) by mouth daily before breakfast.  Current Outpatient Medications (Cardiovascular):  .  diltiazem (CARDIZEM SR) 120 MG 12 hr capsule, Take 1 capsule (120 mg total) by mouth 2 (two) times daily. .  furosemide (LASIX) 20 MG tablet, Take 1 tablet (20 mg total) by mouth every other day. .  pravastatin (PRAVACHOL) 10 MG tablet, Take 1 tablet (10 mg total) by mouth daily.   Current Outpatient Medications (Analgesics):  .  acetaminophen (TYLENOL) 325 MG tablet, Take 1-2 tablets (325-650 mg total) by mouth every 6 (six) hours as needed for mild pain (pain score 1-3 or temp > 100.5).  Current Outpatient Medications (Hematological):  Marland Kitchen  Rivaroxaban (XARELTO) 15 MG TABS tablet, Take 1 tablet (15 mg total) by mouth daily with supper.  Current Outpatient Medications (Other):  .  acidophilus (RISAQUAD) CAPS capsule, Take 1 capsule by mouth daily. Marland Kitchen  acyclovir ointment (ZOVIRAX) 5 %, Apply 1 application topically 2 (two) times daily as needed. .  B Complex-C (B-COMPLEX WITH VITAMIN C) tablet, Take 1 tablet by mouth daily. Marland Kitchen  docusate sodium (COLACE) 100 MG capsule, Take 1 capsule (100 mg total) by mouth daily. Hold for loose bowel movements .   Infant Care Products (DERMACLOUD) CREA, Apply 1 application topically in the morning and at bedtime. Apply to buttocks. .  polyethylene glycol (MIRALAX) 17 g packet, Take 17 g by mouth daily. Hold for loose bowel movements .  potassium chloride (KLOR-CON) 10 MEQ tablet, Take 1 tablet (10 mEq total) by mouth every other day. .  Vitamin D, Ergocalciferol, (DRISDOL) 1.25 MG (50000 UNIT) CAPS capsule, Take 1 capsule (50,000 Units total) by mouth every 7 (seven) days. (Patient taking differently: Take 50,000 Units by mouth every 7 (seven) days. Thursday)   Reviewed prior external information including notes and imaging from  primary care provider As well as notes that were available from care everywhere and other healthcare systems.  Past medical history, social, surgical and family history all reviewed in electronic medical record.  No pertanent information unless stated regarding to the chief complaint.   Review of Systems:  No headache, visual changes, nausea, vomiting, diarrhea, constipation,  dizziness, abdominal pain, fevers, chills, night sweats, weight loss, swollen lymph nodes, body aches, joint swelling, chest pain, shortness of breath, mood changes. POSITIVE muscle aches only on the left side.  Objective  Blood pressure 120/60, pulse 83, height 5\' 3"  (1.6 m), weight 133 lb (60.3 kg), SpO2 100 %.   General: No apparent distress alert and oriented x3 mood and affect normal, dressed appropriately.  HEENT: Pupils equal, extraocular movements intact  Respiratory: Patient's speak in full sentences and does not appear short of breath  Cardiovascular: No lower extremity edema, non tender, no erythema  Patient's abdomen mild increase in stool burden left lower quadrant but not as much in the left upper quadrant.  No masses appreciated.  When inspecting the skin patient does have what appears to be resolving vesicles noted almost in a dermatomal fashion.  Patient does have a very itchy rash versus  very low amount of a dermatitis also noted underneath the skin fold in this area.  Patient is mildly tender in this area.  No pain on the ribs himself.  Mild pain in the intercostals of the T8-T9 more of the anterior lateral aspect.  No pain on the ribs themselves.  Full range of motion of the arm noted.    Impression and Recommendations:     The above documentation has been reviewed and is accurate and complete , DO

## 2020-10-09 ENCOUNTER — Ambulatory Visit (INDEPENDENT_AMBULATORY_CARE_PROVIDER_SITE_OTHER): Payer: Medicare Other | Admitting: Family Medicine

## 2020-10-09 ENCOUNTER — Other Ambulatory Visit: Payer: Self-pay

## 2020-10-09 ENCOUNTER — Encounter: Payer: Self-pay | Admitting: Family Medicine

## 2020-10-09 DIAGNOSIS — R0781 Pleurodynia: Secondary | ICD-10-CM | POA: Diagnosis not present

## 2020-10-09 MED ORDER — ACYCLOVIR 5 % EX OINT: 1.0000 "application " | TOPICAL_OINTMENT | Freq: Two times a day (BID) | CUTANEOUS | 0 refills | Status: DC | PRN

## 2020-10-09 NOTE — Assessment & Plan Note (Signed)
Patient seen today does seem to have actually some vesicle formation that seems to be going down at this point now. We discussed icing regimen and home exercises, discussed which activities to doing which wants to avoid. Patient's differential includes a external oblique muscle injury or intercostal. Given home exercises  If worsening abdomen pain or possible chest pain patient is to seek medical attention immediately. Patient will follow up with me again in 3 to 4 weeks if not completely resolved but I anticipate patient doing well.

## 2020-10-09 NOTE — Patient Instructions (Addendum)
Acyclovir cream 2x a day for 5 days Eucerin or Aveno 2x a day Follow in 3-4 weeks if not better, can cancel if feel good If get worse, seek medical attention at ED

## 2020-10-27 ENCOUNTER — Encounter: Payer: Self-pay | Admitting: Family Medicine

## 2020-10-30 ENCOUNTER — Ambulatory Visit: Payer: Medicare Other | Admitting: Family Medicine

## 2020-11-27 ENCOUNTER — Other Ambulatory Visit: Payer: Self-pay

## 2020-11-27 ENCOUNTER — Ambulatory Visit: Payer: Medicare Other | Admitting: Cardiology

## 2020-11-27 ENCOUNTER — Encounter: Payer: Self-pay | Admitting: Cardiology

## 2020-11-27 VITALS — BP 132/72 | HR 85 | Ht 63.0 in | Wt 135.4 lb

## 2020-11-27 DIAGNOSIS — Z7901 Long term (current) use of anticoagulants: Secondary | ICD-10-CM | POA: Diagnosis not present

## 2020-11-27 DIAGNOSIS — I10 Essential (primary) hypertension: Secondary | ICD-10-CM

## 2020-11-27 DIAGNOSIS — I340 Nonrheumatic mitral (valve) insufficiency: Secondary | ICD-10-CM | POA: Diagnosis not present

## 2020-11-27 DIAGNOSIS — I482 Chronic atrial fibrillation, unspecified: Secondary | ICD-10-CM

## 2020-11-27 NOTE — Progress Notes (Signed)
Cardiology Office Note:    Date:  11/27/2020   ID:  Grace Alvarado, DOB 01/21/1925, MRN [id number]  PCP:  Grace Sanes, MD  Cardiologist:  Grace Millers, MD  Electrophysiologist:  None   Referring MD: Grace Sanes, MD   No chief complaint on file.   History of Present Illness:    Grace Alvarado is a delightful  87 y.o. female originally from the [place] with a hx of permanent atrial fibrillation.  She has failed antiarrhythmic therapy in the past.  She has moderate to severe mitral regurgitation, she does not want to consider surgery if needed. She was seen in March 2021 and complained of leg pain with walking.  ABIs were normal.    In May 2021 she fell at home.  She suffered a traumatic prepatellar bursitis.  She ended up being admitted for a week.  She required a wound VAC and evacuation of a hematoma.  That course was complicated by transient hyponatremia and urinary retention.  She was ultimately discharged to a skilled nursing facility.  From there she went to an assisted living facility, she is now back in her own apartment.  She has been back home for about 6 months.  Her daughter comes daily to help her.  From a cardiac standpoint she is done well.  She denies any unusual dyspnea or tachycardia.  She has had some localized left chest pain that sounds atypical for angina.  She is here for routine follow-up.  Past Medical History:  Diagnosis Date  . Atrial fibrillation (HCC)   . Hyperlipidemia   . Hypertension   . Hypothyroidism   . Mitral regurgitation   . MVP (mitral valve prolapse)     Past Surgical History:  Procedure Laterality Date  . APPENDECTOMY    . c section    . CESAREAN SECTION    . CHOLECYSTECTOMY    . COLONOSCOPY WITH PROPOFOL N/A 11/09/2018   Procedure: COLONOSCOPY WITH PROPOFOL;  Surgeon: Grace Rist, MD;  Location: [facility] ENDOSCOPY;  Service: Gastroenterology;  Laterality: N/A;  . ESOPHAGOGASTRODUODENOSCOPY (EGD) WITH PROPOFOL N/A 11/09/2018   Procedure:  ESOPHAGOGASTRODUODENOSCOPY (EGD) WITH PROPOFOL;  Surgeon: Grace Rist, MD;  Location: [facility] ENDOSCOPY;  Service: Gastroenterology;  Laterality: N/A;  . HOT HEMOSTASIS N/A 11/09/2018   Procedure: HOT HEMOSTASIS (ARGON PLASMA COAGULATION/BICAP);  Surgeon: Grace Rist, MD;  Location: [facility] ENDOSCOPY;  Service: Gastroenterology;  Laterality: N/A;  . I & D EXTREMITY Left 03/02/2020   Procedure: evacuation of left knee hematoma, possible wound vac placement;  Surgeon: Grace Copa, MD;  Location: [facility] OR;  Service: Orthopedics;  Laterality: Left;  . SUBMUCOSAL INJECTION  11/09/2018   Procedure: SUBMUCOSAL INJECTION;  Surgeon: Grace Rist, MD;  Location: [facility] ENDOSCOPY;  Service: Gastroenterology;;    Current Medications: Current Meds  Medication Sig  . acetaminophen (TYLENOL) 325 MG tablet Take 1-2 tablets (325-650 mg total) by mouth every 6 (six) hours as needed for mild pain (pain score 1-3 or temp > 100.5).  Marland Kitchen acidophilus (RISAQUAD) CAPS capsule Take 1 capsule by mouth daily.  Marland Kitchen acyclovir ointment (ZOVIRAX) 5 % Apply 1 application topically 2 (two) times daily as needed.  . B Complex-C (B-COMPLEX WITH VITAMIN C) tablet Take 1 tablet by mouth daily.  Marland Kitchen diltiazem (CARDIZEM SR) 120 MG 12 hr capsule Take 1 capsule (120 mg total) by mouth 2 (two) times daily.  . furosemide (LASIX) 20 MG tablet Take 1 tablet (20 mg total) by  mouth every other day.  . levothyroxine (SYNTHROID) 75 MCG tablet Take 1 tablet (75 mcg total) by mouth daily before breakfast.  . polyethylene glycol (MIRALAX) 17 g packet Take 17 g by mouth daily. Hold for loose bowel movements  . potassium chloride (KLOR-CON) 10 MEQ tablet Take 1 tablet (10 mEq total) by mouth every other day.  . pravastatin (PRAVACHOL) 10 MG tablet Take 1 tablet (10 mg total) by mouth daily.  . Rivaroxaban (XARELTO) 15 MG TABS tablet Take 1 tablet (15 mg total) by mouth daily with supper.  . Vitamin D, Ergocalciferol, (DRISDOL) 1.25 MG (50000 UNIT)  CAPS capsule Take 1 capsule (50,000 Units total) by mouth every 7 (seven) days. (Patient taking differently: Take 50,000 Units by mouth every 7 (seven) days. Thursday)     Allergies:   Codeine, Penicillins, Vancomycin, Zithromax [azithromycin], and Latex   Social History   Socioeconomic History  . Marital status: Widowed    Spouse name: Not on file  . Number of children: 3  . Years of education: Not on file  . Highest education level: Not on file  Occupational History  . Occupation: retired  Tobacco Use  . Smoking status: Former Games developer  . Smokeless tobacco: Never Used  Vaping Use  . Vaping Use: Never used  Substance and Sexual Activity  . Alcohol use: Not Currently  . Drug use: Never  . Sexual activity: Not Currently  Other Topics Concern  . Not on file  Social History Narrative  . Not on file   Social Determinants of Health   Financial Resource Strain: Not on file  Food Insecurity: Not on file  Transportation Needs: Not on file  Physical Activity: Not on file  Stress: Not on file  Social Connections: Not on file     Family History: The patient's family history includes Cancer in her mother; Dementia in her mother.  ROS:   Please see the history of present illness.     All other systems reviewed and are negative.  EKGs/Labs/Other Studies Reviewed:    The following studies were reviewed today: Echo 11/04/2018- IMPRESSIONS    1. The left ventricle has normal systolic function of 60-65%. The cavity  size is normal. There is no left ventricular wall thickness. Echo evidence  of normal diastolic filling patterns.  2. Right ventricular systolic pressure is is moderately elevated.  3. Moderately dilated left atrial size.  4. Moderately dilated right atrial size.  5. Mitral valve regurgitation is moderate to severe by color flow  Doppler.  6. The mitral valve is myxomatous.  7. There is moderate thickening of the mitral valve.  8. Moderately myxomatous  and thickened with posterior prolapse and  moderate to severe MR.  9. Normal tricuspid valve.  10. Aortic valve normal.  11. No atrial level shunt detected by color flow Doppler.   EKG:  EKG is ordered today.  The ekg ordered today demonstrates AF with VR 85  Recent Labs: 12/06/2019: TSH 2.66 04/07/2020: ALT 25; BUN 8; Creatinine, Ser 0.64; Hemoglobin 11.6; Platelets 148; Potassium 3.7; Sodium 134  Recent Lipid Panel    Component Value Date/Time   CHOL 134 12/06/2019 1536   TRIG 60.0 12/06/2019 1536   HDL 63.80 12/06/2019 1536   CHOLHDL 2 12/06/2019 1536   VLDL 12.0 12/06/2019 1536   LDLCALC 58 12/06/2019 1536    Physical Exam:    VS:  BP 132/72   Pulse 85   Ht 5\' 3"  (1.6 m)   Wt 135  lb 6.4 oz (61.4 kg)   LMP  (LMP Unknown)   BMI 23.99 kg/m     Wt Readings from Last 3 Encounters:  11/27/20 135 lb 6.4 oz (61.4 kg)  10/09/20 133 lb (60.3 kg)  09/27/20 131 lb (59.4 kg)     GEN:  Well nourished, elderly female, in no acute distress HEENT: Normal NECK: No JVD CARDIAC: irregularly irregular, no murmurs, rubs, gallops RESPIRATORY:  kyphoscoliosis,clear to auscultation without rales, wheezing or rhonchi  ABDOMEN: Soft,  non-distended MUSCULOSKELETAL:  No edema; No deformity  SKIN: Warm and dry NEUROLOGIC:  Alert and oriented x 3 PSYCHIATRIC:  Normal affect   ASSESSMENT:    Chronic a-fib Rate control-   Essential hypertension Controlled  Mitral regurgitation Moderate to severe central mitral regurg. DOES NOT WANT SURGERY   Fall- Hospitalized May 2021 after fall- then SNF, then assisted living- now back home in her own apartment and doing well.  No recurrent falls.  Encouraged to use her walker-"I don't really need it".   Chronic anticoagulation Xarelto 15 mg daily   PLAN:    No change in current cardiac Rx.  I'll defer labs to Dr Lawerance Bach who will see her next month.  F/U Dr Jens Som in 6 months.   Medication Adjustments/Labs and Tests Ordered: Current  medicines are reviewed at length with the patient today.  Concerns regarding medicines are outlined above.  No orders of the defined types were placed in this encounter.  No orders of the defined types were placed in this encounter.   There are no Patient Instructions on file for this visit.   Jolene Provost, PA-C  11/27/2020 10:41 AM    [facility]

## 2020-11-27 NOTE — Patient Instructions (Signed)
Medication Instructions:  Your physician recommends that you continue on your current medications as directed. Please refer to the Current Medication list given to you today.   *If you need a refill on your cardiac medications before your next appointment, please call your pharmacy*  Lab Work: NONE   Testing/Procedures: NONE   Follow-Up: At [facility], you and your health needs are our priority.  As part of our continuing mission to provide you with exceptional heart care, we have created designated Provider Care Teams.  These Care Teams include your primary Cardiologist (physician) and Advanced Practice Providers (APPs -  Physician Assistants and Nurse Practitioners) who all work together to provide you with the care you need, when you need it.  We recommend signing up for the patient portal called "MyChart".  Sign up information is provided on this After Visit Summary.  MyChart is used to connect with patients for Virtual Visits (Telemedicine).  Patients are able to view lab/test results, encounter notes, upcoming appointments, etc.  Non-urgent messages can be sent to your provider as well.   To learn more about what you can do with MyChart, go to [email or web address].    Your next appointment:   6 month(s)  The format for your next appointment:   In Person  Provider:   Olga Millers, MD

## 2020-12-16 NOTE — Patient Instructions (Addendum)
    Blood work was ordered.      Medications changes include :   none     Please followup in 6 months  

## 2020-12-16 NOTE — Progress Notes (Signed)
Subjective:    Patient ID: Grace Alvarado, female    DOB: [birth year], 87 y.o.   MRN: [id number]  HPI The patient is here for follow up of their chronic medical problems, including htn, CAD, dyslipidemia, hypothyroidism, pilonidal cyst, hyperglycemia, leg edema   She still has pain in her LUQ/left lower rib - it is very painful.  It is worse with movement - gets the sharp pain.  No pain w/o movement.  The area is itchy.     She takes tylenol at night.  She has not taken it in during the day.  She has tried some Eucerin cream which may help.   She takes her BP daily.  She feels her chronic medical problems are stable.   Medications and allergies reviewed with patient and updated if appropriate.  Patient Active Problem List   Diagnosis Date Noted  . Rib pain on left side 09/27/2020  . LUQ pain 09/17/2020  . Pilonidal cyst 09/17/2020  . Allergic rhinitis 07/03/2020  . Pressure injury of sacral region, stage 1 07/03/2020  . Aortic atherosclerosis (HCC) 06/13/2020  . Hyponatremia 03/07/2020  . DNR (do not resuscitate) 03/07/2020  . Prepatellar bursitis, left knee 03/02/2020  . Neuropathy 01/24/2020  . Chronic back pain 01/24/2020  . Complaints of leg weakness 01/24/2020  . Compression fracture of T11 vertebra (HCC) 01/23/2020  . Leg pain 01/03/2020  . Chronic anticoagulation 01/03/2020  . Compression fracture of L1 lumbar vertebra (HCC) 07/27/2019  . Low back pain 07/20/2019  . Hyperglycemia 05/01/2019  . Neck pain, musculoskeletal 11/16/2018  . Constipation 11/16/2018  . Hypokalemia   . AVM (arteriovenous malformation) of colon   . Lower GI bleed 11/06/2018  . Iron deficiency anemia 11/02/2018  . Lymphedema of left lower extremity 10/22/2018  . B12 deficiency 05/27/2018  . Dependent edema 05/25/2018  . Pulmonary hypertension, primary (HCC) 05/20/2018  . Dyslipidemia 05/20/2018  . CHF (congestive heart failure) (HCC) 05/20/2018  . Tricuspid regurgitation 05/17/2018  .  Mitral regurgitation 05/17/2018  . Chronic a-fib 05/17/2018  . Essential hypertension 05/17/2018  . Hypothyroid 05/17/2018  . CAD (coronary artery disease) 05/17/2018  . Carotid atherosclerosis, bilateral 05/17/2018    Current Outpatient Medications on File Prior to Visit  Medication Sig Dispense Refill  . acetaminophen (TYLENOL) 325 MG tablet Take 1-2 tablets (325-650 mg total) by mouth every 6 (six) hours as needed for mild pain (pain score 1-3 or temp > 100.5).    Marland Kitchen acidophilus (RISAQUAD) CAPS capsule Take 1 capsule by mouth daily.    Marland Kitchen acyclovir ointment (ZOVIRAX) 5 % Apply 1 application topically 2 (two) times daily as needed. 5 g 0  . B Complex-C (B-COMPLEX WITH VITAMIN C) tablet Take 1 tablet by mouth daily.    Marland Kitchen diltiazem (CARDIZEM SR) 120 MG 12 hr capsule Take 1 capsule (120 mg total) by mouth 2 (two) times daily. 180 capsule 3  . furosemide (LASIX) 20 MG tablet Take 1 tablet (20 mg total) by mouth every other day. 45 tablet 3  . levothyroxine (SYNTHROID) 75 MCG tablet Take 1 tablet (75 mcg total) by mouth daily before breakfast. 90 tablet 1  . polyethylene glycol (MIRALAX) 17 g packet Take 17 g by mouth daily. Hold for loose bowel movements 60 each 0  . potassium chloride (KLOR-CON) 10 MEQ tablet Take 1 tablet (10 mEq total) by mouth every other day. 45 tablet 3  . pravastatin (PRAVACHOL) 10 MG tablet Take 1 tablet (10 mg total) by mouth daily. 90  tablet 3  . Rivaroxaban (XARELTO) 15 MG TABS tablet Take 1 tablet (15 mg total) by mouth daily with supper. 90 tablet 3  . Vitamin D, Ergocalciferol, (DRISDOL) 1.25 MG (50000 UNIT) CAPS capsule Take 1 capsule (50,000 Units total) by mouth every 7 (seven) days. (Patient taking differently: Take 50,000 Units by mouth every 7 (seven) days. Thursday) 8 capsule 0   No current facility-administered medications on file prior to visit.    Past Medical History:  Diagnosis Date  . Atrial fibrillation (HCC)   . Hyperlipidemia   . Hypertension    . Hypothyroidism   . Mitral regurgitation   . MVP (mitral valve prolapse)     Past Surgical History:  Procedure Laterality Date  . APPENDECTOMY    . c section    . CESAREAN SECTION    . CHOLECYSTECTOMY    . COLONOSCOPY WITH PROPOFOL N/A 11/09/2018   Procedure: COLONOSCOPY WITH PROPOFOL;  Surgeon: Sherrilyn Rist, MD;  Location: [facility] ENDOSCOPY;  Service: Gastroenterology;  Laterality: N/A;  . ESOPHAGOGASTRODUODENOSCOPY (EGD) WITH PROPOFOL N/A 11/09/2018   Procedure: ESOPHAGOGASTRODUODENOSCOPY (EGD) WITH PROPOFOL;  Surgeon: Sherrilyn Rist, MD;  Location: [facility] ENDOSCOPY;  Service: Gastroenterology;  Laterality: N/A;  . HOT HEMOSTASIS N/A 11/09/2018   Procedure: HOT HEMOSTASIS (ARGON PLASMA COAGULATION/BICAP);  Surgeon: Sherrilyn Rist, MD;  Location: [facility] ENDOSCOPY;  Service: Gastroenterology;  Laterality: N/A;  . I & D EXTREMITY Left 03/02/2020   Procedure: evacuation of left knee hematoma, possible wound vac placement;  Surgeon: Cammy Copa, MD;  Location[facility];  Service: Orthopedics;  Laterality: Left;  . SUBMUCOSAL INJECTION  11/09/2018   Procedure: SUBMUCOSAL INJECTION;  Surgeon: Sherrilyn Rist, MD;  Location: [facility] ENDOSCOPY;  Service: Gastroenterology;;    Social History   Socioeconomic History  . Marital status: Widowed    Spouse name: Not on file  . Number of children: 3  . Years of education: Not on file  . Highest education level: Not on file  Occupational History  . Occupation: retired  Tobacco Use  . Smoking status: Former Games developer  . Smokeless tobacco: Never Used  Vaping Use  . Vaping Use: Never used  Substance and Sexual Activity  . Alcohol use: Not Currently  . Drug use: Never  . Sexual activity: Not Currently  Other Topics Concern  . Not on file  Social History Narrative  . Not on file   Social Determinants of Health   Financial Resource Strain: Not on file  Food Insecurity: Not on file  Transportation Needs: Not on file  Physical Activity: Not  on file  Stress: Not on file  Social Connections: Not on file    Family History  Problem Relation Age of Onset  . Cancer Mother   . Dementia Mother     Review of Systems  Constitutional: Positive for fatigue. Negative for fever.  Respiratory: Negative for cough, shortness of breath and wheezing.   Cardiovascular: Positive for leg swelling. Negative for chest pain and palpitations.  Neurological: Negative for light-headedness and headaches.       Objective:   Vitals:   12/18/20 1126  BP: 128/78  Pulse: 100  Temp: 98.1 F (36.7 C)  SpO2: 98%   BP Readings from Last 3 Encounters:  12/18/20 128/78  11/27/20 132/72  10/09/20 120/60   Wt Readings from Last 3 Encounters:  12/18/20 130 lb (59 kg)  11/27/20 135 lb 6.4 oz (61.4 kg)  10/09/20 133 lb (60.3 kg)  Body mass index is 23.03 kg/m.   Physical Exam    Constitutional: Appears well-developed and well-nourished. No distress.  HENT:  Head: Normocephalic and atraumatic.  Neck: Neck supple. No tracheal deviation present. No thyromegaly present.  No cervical lymphadenopathy Cardiovascular: Normal rate, irregular rhythm and normal heart sounds.   3/6 murmur heard. No carotid bruit .  No edema Pulmonary/Chest: Some tenderness with palpation left lower anterior-lateral rib.  Effort normal and breath sounds normal. No respiratory distress. No has no wheezes. No rales.  Skin: Skin is warm and dry. Not diaphoretic.  Pilonidal cyst without discharge or surrounding erythema Psychiatric: Normal mood and affect. Behavior is normal.      Assessment & Plan:    See Problem List for Assessment and Plan of chronic medical problems.    This visit occurred during the SARS-CoV-2 public health emergency.  Safety protocols were in place, including screening questions prior to the visit, additional usage of staff PPE, and extensive cleaning of exam room while observing appropriate contact time as indicated for disinfecting solutions.

## 2020-12-18 ENCOUNTER — Ambulatory Visit: Payer: Medicare Other | Admitting: Internal Medicine

## 2020-12-18 ENCOUNTER — Other Ambulatory Visit: Payer: Self-pay

## 2020-12-18 ENCOUNTER — Encounter: Payer: Self-pay | Admitting: Internal Medicine

## 2020-12-18 VITALS — BP 128/78 | HR 100 | Temp 98.1°F | Ht 63.0 in | Wt 130.0 lb

## 2020-12-18 DIAGNOSIS — I10 Essential (primary) hypertension: Secondary | ICD-10-CM | POA: Diagnosis not present

## 2020-12-18 DIAGNOSIS — E538 Deficiency of other specified B group vitamins: Secondary | ICD-10-CM | POA: Diagnosis not present

## 2020-12-18 DIAGNOSIS — E785 Hyperlipidemia, unspecified: Secondary | ICD-10-CM | POA: Diagnosis not present

## 2020-12-18 DIAGNOSIS — E038 Other specified hypothyroidism: Secondary | ICD-10-CM | POA: Diagnosis not present

## 2020-12-18 DIAGNOSIS — L0591 Pilonidal cyst without abscess: Secondary | ICD-10-CM | POA: Diagnosis not present

## 2020-12-18 DIAGNOSIS — R1012 Left upper quadrant pain: Secondary | ICD-10-CM

## 2020-12-18 DIAGNOSIS — I2510 Atherosclerotic heart disease of native coronary artery without angina pectoris: Secondary | ICD-10-CM

## 2020-12-18 DIAGNOSIS — I700 Atherosclerosis of aorta: Secondary | ICD-10-CM

## 2020-12-18 DIAGNOSIS — E559 Vitamin D deficiency, unspecified: Secondary | ICD-10-CM

## 2020-12-18 DIAGNOSIS — R609 Edema, unspecified: Secondary | ICD-10-CM

## 2020-12-18 LAB — CBC WITH DIFFERENTIAL/PLATELET
Basophils Absolute: 0 10*3/uL (ref 0.0–0.1)
Basophils Relative: 0.4 % (ref 0.0–3.0)
Eosinophils Absolute: 0 10*3/uL (ref 0.0–0.7)
Eosinophils Relative: 0.9 % (ref 0.0–5.0)
HCT: 38.1 % (ref 36.0–46.0)
Hemoglobin: 12.8 g/dL (ref 12.0–15.0)
Lymphocytes Relative: 27.8 % (ref 12.0–46.0)
Lymphs Abs: 1.2 10*3/uL (ref 0.7–4.0)
MCHC: 33.7 g/dL (ref 30.0–36.0)
MCV: 97.9 fl (ref 78.0–100.0)
Monocytes Absolute: 0.4 10*3/uL (ref 0.1–1.0)
Monocytes Relative: 8.9 % (ref 3.0–12.0)
Neutro Abs: 2.7 10*3/uL (ref 1.4–7.7)
Neutrophils Relative %: 62 % (ref 43.0–77.0)
Platelets: 136 10*3/uL — ABNORMAL LOW (ref 150.0–400.0)
RBC: 3.89 Mil/uL (ref 3.87–5.11)
RDW: 14.3 % (ref 11.5–15.5)
WBC: 4.4 10*3/uL (ref 4.0–10.5)

## 2020-12-18 LAB — TSH: TSH: 3.19 u[IU]/mL (ref 0.35–4.50)

## 2020-12-18 LAB — LIPID PANEL
Cholesterol: 121 mg/dL (ref 0–200)
HDL: 58.3 mg/dL (ref >39.00)
LDL Cholesterol: 52 mg/dL (ref 0–99)
NonHDL: 62.51
Total CHOL/HDL Ratio: 2
Triglycerides: 55 mg/dL (ref 0.0–149.0)
VLDL: 11 mg/dL (ref 0.0–40.0)

## 2020-12-18 LAB — COMPREHENSIVE METABOLIC PANEL
ALT: 34 U/L (ref 0–35)
AST: 27 U/L (ref 0–37)
Albumin: 4.1 g/dL (ref 3.5–5.2)
Alkaline Phosphatase: 62 U/L (ref 39–117)
BUN: 11 mg/dL (ref 6–23)
CO2: 31 mEq/L (ref 19–32)
Calcium: 10.1 mg/dL (ref 8.4–10.5)
Chloride: 101 mEq/L (ref 96–112)
Creatinine, Ser: 0.59 mg/dL (ref 0.40–1.20)
GFR: 77.69 mL/min (ref >60.00)
Glucose, Bld: 94 mg/dL (ref 70–99)
Potassium: 4.4 mEq/L (ref 3.5–5.1)
Sodium: 138 mEq/L (ref 135–145)
Total Bilirubin: 0.8 mg/dL (ref 0.2–1.2)
Total Protein: 7.6 g/dL (ref 6.0–8.3)

## 2020-12-18 LAB — HEMOGLOBIN A1C: Hgb A1c MFr Bld: 5.2 % (ref 4.6–6.5)

## 2020-12-18 LAB — VITAMIN D 25 HYDROXY (VIT D DEFICIENCY, FRACTURES): VITD: 55.66 ng/mL (ref 30.00–100.00)

## 2020-12-18 MED ORDER — CYANOCOBALAMIN 1000 MCG/ML IJ SOLN
1000.0000 ug | Freq: Once | INTRAMUSCULAR | Status: AC
  Administered 2020-12-18: 1000 ug via INTRAMUSCULAR

## 2020-12-18 NOTE — Assessment & Plan Note (Signed)
Chronic Check lipid panel  Continue pravastatin 10 mg daily Regular exercise and healthy diet encouraged  

## 2020-12-18 NOTE — Assessment & Plan Note (Signed)
Chronic Blood pressure well controlled Continue diltiazem 120 mg twice daily CMP

## 2020-12-18 NOTE — Assessment & Plan Note (Signed)
Chronic Following with cardiology No symptoms suggestive of angina Continue pravastatin 10 mg daily, Xarelto 50 mg daily, diltiazem 120 mg twice daily

## 2020-12-18 NOTE — Assessment & Plan Note (Signed)
Subacute Intermittent She continues to have pain in the left upper quadrant/left lower rib-?  Etiology The pain seems to only come with movement, she denies any pain at rest so I do think this is musculoskeletal in nature Discussed more advanced imaging-she deferred Increase Tylenol to twice daily-in the morning and at bedtime Can try topical arthritis medications

## 2020-12-18 NOTE — Assessment & Plan Note (Signed)
Chronic Getting monthly B12 shots-one given her today Continue monthly B12 injections

## 2020-12-18 NOTE — Assessment & Plan Note (Signed)
Chronic  Clinically euthyroid Currently taking levothyroxine 75 mcg daily Check tsh  Titrate med dose if needed  

## 2020-12-18 NOTE — Assessment & Plan Note (Signed)
Chronic Continue pravastatin 10 mg daily Lipid panel

## 2020-12-18 NOTE — Assessment & Plan Note (Signed)
  Stable, controlled Continue Lasix 20 mg every other day and potassium 10 mEq every other day with Lasix

## 2020-12-18 NOTE — Assessment & Plan Note (Signed)
Chronic Has some pain when sitting on exam there is no erythema or discharge suggesting infection She deferred surgical evaluation We will monitor

## 2020-12-20 ENCOUNTER — Ambulatory Visit: Payer: Medicare Other | Admitting: Podiatry

## 2020-12-20 ENCOUNTER — Other Ambulatory Visit: Payer: Self-pay

## 2020-12-20 DIAGNOSIS — M79674 Pain in right toe(s): Secondary | ICD-10-CM | POA: Diagnosis not present

## 2020-12-20 DIAGNOSIS — B351 Tinea unguium: Secondary | ICD-10-CM | POA: Diagnosis not present

## 2020-12-20 DIAGNOSIS — Z7901 Long term (current) use of anticoagulants: Secondary | ICD-10-CM

## 2020-12-20 DIAGNOSIS — M79675 Pain in left toe(s): Secondary | ICD-10-CM

## 2020-12-22 ENCOUNTER — Encounter: Payer: Self-pay | Admitting: Internal Medicine

## 2020-12-22 NOTE — Progress Notes (Signed)
Subjective: 87 y.o. returns the office today for painful, elongated, thickened toenails which she cannot trim herself. Denies any redness or drainage around the nails. Denies any acute changes since last appointment and no new complaints today. Denies any systemic complaints such as fevers, chills, nausea, vomiting.   PCP: Pincus Sanes, MD Last seen 12/18/2020  Objective: NAD- presents with caregiver DP/PT pulses palpable, CRT less than 3 seconds Nails hypertrophic, dystrophic, elongated, brittle, discolored 10. There is tenderness overlying the nails 1-5 on the left and 1-5 on the right. There is no surrounding erythema or drainage along the nail sites. Minimal hyperkeratotic tissue medial first MPJ.  No ulcerations identified No open lesions or pre-ulcerative lesions are identified. No other areas of discomfort to bilateral lower extremities today.  No pain with calf compression, swelling, warmth, erythema.  Assessment: Patient presents with symptomatic onychomycosis  Plan: -Treatment options including alternatives, risks, complications were discussed -Nails sharply debrided x 10 without complication/bleeding. -Moisturizer daily to the calluses.  No significant callus or debried. -Discussed daily foot inspection. If there are any changes, to call the office immediately.  -Follow-up in 3 months or sooner if any problems are to arise. In the meantime, encouraged to call the office with any questions, concerns, changes symptoms.  Ovid Curd, DPM

## 2020-12-25 ENCOUNTER — Encounter: Payer: Self-pay | Admitting: Family Medicine

## 2020-12-26 ENCOUNTER — Other Ambulatory Visit: Payer: Self-pay

## 2020-12-26 ENCOUNTER — Ambulatory Visit (INDEPENDENT_AMBULATORY_CARE_PROVIDER_SITE_OTHER): Payer: Medicare Other

## 2020-12-26 ENCOUNTER — Ambulatory Visit: Payer: Self-pay

## 2020-12-26 ENCOUNTER — Encounter: Payer: Self-pay | Admitting: Family Medicine

## 2020-12-26 ENCOUNTER — Ambulatory Visit (INDEPENDENT_AMBULATORY_CARE_PROVIDER_SITE_OTHER): Payer: Medicare Other | Admitting: Family Medicine

## 2020-12-26 VITALS — BP 100/82 | HR 88 | Ht 63.0 in | Wt 134.0 lb

## 2020-12-26 DIAGNOSIS — R1012 Left upper quadrant pain: Secondary | ICD-10-CM | POA: Diagnosis not present

## 2020-12-26 DIAGNOSIS — R0781 Pleurodynia: Secondary | ICD-10-CM | POA: Diagnosis not present

## 2020-12-26 NOTE — Patient Instructions (Addendum)
Good to see you Looks like old muscle injury but not likely contributing Some constipation Xray on the way out Mirlax 17 grams with 100 mg of colace daily for the next 10 days and see if that helps If no significant relief within 2-3 weeks call us we will consider CT scan IF severe worsening pain seek medical attention Can make a follow up in 5-6 weeks if needed

## 2020-12-26 NOTE — Assessment & Plan Note (Signed)
Patient continues to have pain in his left upper quadrant pain.  Questionable lower rib injury is within the differential.  On ultrasound today though not significant findings.  Does seem to get worse with movement.  We discussed that there is nothing on ultrasound did have some mild findings that could be consistent with a muscle injury but should be getting better on its own and does seem to be more of a chronic issue.  Patient does have significant dilation of the colon noted in the area that distant with more of constipation.  Discussed no further imaging would include a CT abdomen pelvis as well as a CT chest secondary to the area.  Patient would not want to lay down that long if possible.  We will get a KUB as well as a rib series to further evaluate for any other bony abnormality that could be contributing.  Patient will follow up with me again in 5 to 6 weeks otherwise the patient as well as primary caregiver understands worsening pain to seek medical attention immediately

## 2020-12-26 NOTE — Progress Notes (Signed)
[facility] [address] [place] [address] Phone: [phone] Subjective:   I Grace Alvarado am serving as a Neurosurgeon for Dr. Antoine Alvarado.  This visit occurred during the SARS-CoV-2 public health emergency.  Safety protocols were in place, including screening questions prior to the visit, additional usage of staff PPE, and extensive cleaning of exam room while observing appropriate contact time as indicated for disinfecting solutions.   I'm seeing this patient by the request  of:  Grace Sanes, MD  CC: Left-sided pain  NAT:[id number]   10/09/2020 Patient seen today does seem to have actually some vesicle formation that seems to be going down at this point now. We discussed icing regimen and home exercises, discussed which activities to doing which wants to avoid. Patient's differential includes a external oblique muscle injury or intercostal. Given home exercises  If worsening abdomen pain or possible chest pain patient is to seek medical attention immediately. Patient will follow up with me again in 3 to 4 weeks if not completely resolved but I anticipate patient doing well.  12/26/2020 Grace Alvarado is a 87 y.o. female coming in with complaint of left sided rib pain. Underneath the left breast. States that the pain comes and goes. Daughter does not believe it is shingles. Certain ROM causes an increase in pain. States yesterday she had no pain at all without medication. Last night she had pain.  Seems to come and go.  Does not know exactly what it does but does seem to be related a little bit more to range of motion.  And a topical patch which has been helping somewhat.     Past Medical History:  Diagnosis Date  . Atrial fibrillation (HCC)   . Hyperlipidemia   . Hypertension   . Hypothyroidism   . Mitral regurgitation   . MVP (mitral valve prolapse)    Past Surgical History:  Procedure Laterality Date  . APPENDECTOMY    . c section    .  CESAREAN SECTION    . CHOLECYSTECTOMY    . COLONOSCOPY WITH PROPOFOL N/A 11/09/2018   Procedure: COLONOSCOPY WITH PROPOFOL;  Surgeon: Grace Rist, MD;  Location: [facility] ENDOSCOPY;  Service: Gastroenterology;  Laterality: N/A;  . ESOPHAGOGASTRODUODENOSCOPY (EGD) WITH PROPOFOL N/A 11/09/2018   Procedure: ESOPHAGOGASTRODUODENOSCOPY (EGD) WITH PROPOFOL;  Surgeon: Grace Rist, MD;  Location: [facility] ENDOSCOPY;  Service: Gastroenterology;  Laterality: N/A;  . HOT HEMOSTASIS N/A 11/09/2018   Procedure: HOT HEMOSTASIS (ARGON PLASMA COAGULATION/BICAP);  Surgeon: Grace Rist, MD;  Location: [facility] ENDOSCOPY;  Service: Gastroenterology;  Laterality: N/A;  . I & D EXTREMITY Left 03/02/2020   Procedure: evacuation of left knee hematoma, possible wound vac placement;  Surgeon: Grace Copa, MD;  Location: [facility] OR;  Service: Orthopedics;  Laterality: Left;  . SUBMUCOSAL INJECTION  11/09/2018   Procedure: SUBMUCOSAL INJECTION;  Surgeon: Grace Rist, MD;  Location: [facility] ENDOSCOPY;  Service: Gastroenterology;;   Social History   Socioeconomic History  . Marital status: Widowed    Spouse name: Not on file  . Number of children: 3  . Years of education: Not on file  . Highest education level: Not on file  Occupational History  . Occupation: retired  Tobacco Use  . Smoking status: Former Games developer  . Smokeless tobacco: Never Used  Vaping Use  . Vaping Use: Never used  Substance and Sexual Activity  . Alcohol use: Not Currently  . Drug use: Never  .  Sexual activity: Not Currently  Other Topics Concern  . Not on file  Social History Narrative  . Not on file   Social Determinants of Health   Financial Resource Strain: Not on file  Food Insecurity: Not on file  Transportation Needs: Not on file  Physical Activity: Not on file  Stress: Not on file  Social Connections: Not on file   Allergies  Allergen Reactions  . Codeine Other (See Comments)    unknown  . Penicillins Other (See  Comments)    unknown  . Vancomycin Other (See Comments)    unknown  . Zithromax [Azithromycin] Other (See Comments)    unknown  . Latex Rash   Family History  Problem Relation Age of Onset  . Cancer Mother   . Dementia Mother     Current Outpatient Medications (Endocrine & Metabolic):  .  levothyroxine (SYNTHROID) 75 MCG tablet, Take 1 tablet (75 mcg total) by mouth daily before breakfast.  Current Outpatient Medications (Cardiovascular):  .  diltiazem (CARDIZEM SR) 120 MG 12 hr capsule, Take 1 capsule (120 mg total) by mouth 2 (two) times daily. .  furosemide (LASIX) 20 MG tablet, Take 1 tablet (20 mg total) by mouth every other day. .  pravastatin (PRAVACHOL) 10 MG tablet, Take 1 tablet (10 mg total) by mouth daily.   Current Outpatient Medications (Analgesics):  .  acetaminophen (TYLENOL) 325 MG tablet, Take 1-2 tablets (325-650 mg total) by mouth every 6 (six) hours as needed for mild pain (pain score 1-3 or temp > 100.5).  Current Outpatient Medications (Hematological):  Marland Kitchen  Rivaroxaban (XARELTO) 15 MG TABS tablet, Take 1 tablet (15 mg total) by mouth daily with supper.  Current Outpatient Medications (Other):  .  acidophilus (RISAQUAD) CAPS capsule, Take 1 capsule by mouth daily. Marland Kitchen  acyclovir ointment (ZOVIRAX) 5 %, Apply 1 application topically 2 (two) times daily as needed. .  B Complex-C (B-COMPLEX WITH VITAMIN C) tablet, Take 1 tablet by mouth daily. .  polyethylene glycol (MIRALAX) 17 g packet, Take 17 g by mouth daily. Hold for loose bowel movements .  potassium chloride (KLOR-CON) 10 MEQ tablet, Take 1 tablet (10 mEq total) by mouth every other day. .  Vitamin D, Ergocalciferol, (DRISDOL) 1.25 MG (50000 UNIT) CAPS capsule, Take 1 capsule (50,000 Units total) by mouth every 7 (seven) days. (Patient taking differently: Take 50,000 Units by mouth every 7 (seven) days. Thursday)   Reviewed prior external information including notes and imaging from  primary care  provider As well as notes that were available from care everywhere and other healthcare systems.  Past medical history, social, surgical and family history all reviewed in electronic medical record.  No pertanent information unless stated regarding to the chief complaint.   Review of Systems:  No headache, visual changes, nausea, vomiting, diarrhea, constipation, dizziness,, skin rash, fevers, chills, night sweats, weight loss, swollen lymph nodes,joint swelling, chest pain, shortness of breath, mood changes. POSITIVE muscle aches, body aches, abdominal pain  Objective  Blood pressure 100/82, pulse 88, height 5\' 3"  (1.6 m), weight 134 lb (60.8 kg), SpO2 99 %.   General: No apparent distress alert and oriented x3 mood and affect normal, dressed appropriately.  HEENT: Pupils equal, extraocular movements intact  Respiratory: Patient's speak in full sentences and does not appear short of breath  Cardiovascular: No lower extremity edema, non tender, no erythema  Gait antalgic using the aid of a walker. MSK: Significant arthritic changes of multiple joints Patient does have significant  scoliosis of the back. Patient has more tenderness just at the infracostal border.  Seems to be more in the left upper quadrant.  Patient does have voluntary and involuntary guarding of the stomach but no rebound tenderness.  Patient does have a large stool burden noted.  Mild pain to even light palpation over the 11th and 12th ribs but no significant crepitus noted.  No rash appreciated today.  Limited musculoskeletal ultrasound was attempted Limited ultrasound did not show any true cortical irregularity but very mild calcific changes within the abdominal muscle.  Seems to be chronic though.  With compression no significant increase in pain.  Left upper quadrant pain external looks to be more of a large stool burden. Impression: No specific musculoskeletal findings but possible large stool burden   Impression and  Recommendations:     The above documentation has been reviewed and is accurate and complete Judi Saa, DO

## 2020-12-27 ENCOUNTER — Encounter: Payer: Self-pay | Admitting: Family Medicine

## 2021-01-03 ENCOUNTER — Encounter: Payer: Self-pay | Admitting: Family Medicine

## 2021-01-10 ENCOUNTER — Other Ambulatory Visit: Payer: Self-pay | Admitting: Internal Medicine

## 2021-01-21 ENCOUNTER — Ambulatory Visit: Payer: Medicare Other

## 2021-01-28 ENCOUNTER — Ambulatory Visit (INDEPENDENT_AMBULATORY_CARE_PROVIDER_SITE_OTHER): Payer: Medicare Other

## 2021-01-28 ENCOUNTER — Other Ambulatory Visit: Payer: Self-pay

## 2021-01-28 DIAGNOSIS — E538 Deficiency of other specified B group vitamins: Secondary | ICD-10-CM

## 2021-01-28 MED ORDER — CYANOCOBALAMIN 1000 MCG/ML IJ SOLN
1000.0000 ug | INTRAMUSCULAR | Status: DC
  Administered 2021-01-28 – 2021-06-03 (×4): 1000 ug via INTRAMUSCULAR

## 2021-01-28 NOTE — Progress Notes (Signed)
Pt here for monthly B12 injection per Dr. Lawerance Bach  B12 given  Right deltoid IM, and pt tolerated injection well.  Next B12 injection scheduled for 02/27/21

## 2021-01-28 NOTE — Progress Notes (Signed)
[facility] [address] [place] [address] Phone: [phone] Subjective:   I Grace Alvarado am serving as a Neurosurgeon for Dr. Antoine Primas.  This visit occurred during the SARS-CoV-2 public health emergency.  Safety protocols were in place, including screening questions prior to the visit, additional usage of staff PPE, and extensive cleaning of exam room while observing appropriate contact time as indicated for disinfecting solutions.   I'm seeing this patient by the request  of:  Pincus Sanes, MD  CC: Left-sided pain  IRW:[id number]   12/26/2020 Patient continues to have pain in his left upper quadrant pain.  Questionable lower rib injury is within the differential.  On ultrasound today though not significant findings.  Does seem to get worse with movement.  We discussed that there is nothing on ultrasound did have some mild findings that could be consistent with a muscle injury but should be getting better on its own and does seem to be more of a chronic issue.  Patient does have significant dilation of the colon noted in the area that distant with more of constipation.  Discussed no further imaging would include a CT abdomen pelvis as well as a CT chest secondary to the area.  Patient would not want to lay down that long if possible.  We will get a KUB as well as a rib series to further evaluate for any other bony abnormality that could be contributing.  Patient will follow up with me again in 5 to 6 weeks otherwise the patient as well as primary caregiver understands worsening pain to seek medical attention immediately  Update 01/29/2021 Grace Alvarado is a 87 y.o. female coming in with complaint of L rib pain. Has been using bowel regimen, lidocaine patches, and Tylenol per her MyChart message. Patient states she was terrible yesterday. Today she is doing better and not in pain. Pain with movement.  Patient continues to have discomfort that seems to be  intermittent.  Some days are worse than others.  Patient did do the bowel regimen but does not know if it made any significant difference.    Past Medical History:  Diagnosis Date  . Atrial fibrillation (HCC)   . Hyperlipidemia   . Hypertension   . Hypothyroidism   . Mitral regurgitation   . MVP (mitral valve prolapse)    Past Surgical History:  Procedure Laterality Date  . APPENDECTOMY    . c section    . CESAREAN SECTION    . CHOLECYSTECTOMY    . COLONOSCOPY WITH PROPOFOL N/A 11/09/2018   Procedure: COLONOSCOPY WITH PROPOFOL;  Surgeon: Sherrilyn Rist, MD;  Location[facility] ENDOSCOPY;  Service: Gastroenterology;  Laterality: N/A;  . ESOPHAGOGASTRODUODENOSCOPY (EGD) WITH PROPOFOL N/A 11/09/2018   Procedure: ESOPHAGOGASTRODUODENOSCOPY (EGD) WITH PROPOFOL;  Surgeon: Sherrilyn Rist, MD;  Location: [facility] ENDOSCOPY;  Service: Gastroenterology;  Laterality: N/A;  . HOT HEMOSTASIS N/A 11/09/2018   Procedure: HOT HEMOSTASIS (ARGON PLASMA COAGULATION/BICAP);  Surgeon: Sherrilyn Rist, MD;  Location: [facility] ENDOSCOPY;  Service: Gastroenterology;  Laterality: N/A;  . I & D EXTREMITY Left 03/02/2020   Procedure: evacuation of left knee hematoma, possible wound vac placement;  Surgeon: Cammy Copa, MD;  Location: [facility] OR;  Service: Orthopedics;  Laterality: Left;  . SUBMUCOSAL INJECTION  11/09/2018   Procedure: SUBMUCOSAL INJECTION;  Surgeon: Sherrilyn Rist, MD;  Location: [facility] ENDOSCOPY;  Service: Gastroenterology;;   Social History   Socioeconomic History  . Marital  status: Widowed    Spouse name: Not on file  . Number of children: 3  . Years of education: Not on file  . Highest education level: Not on file  Occupational History  . Occupation: retired  Tobacco Use  . Smoking status: Former Games developer  . Smokeless tobacco: Never Used  Vaping Use  . Vaping Use: Never used  Substance and Sexual Activity  . Alcohol use: Not Currently  . Drug use: Never  . Sexual activity: Not Currently   Other Topics Concern  . Not on file  Social History Narrative  . Not on file   Social Determinants of Health   Financial Resource Strain: Not on file  Food Insecurity: Not on file  Transportation Needs: Not on file  Physical Activity: Not on file  Stress: Not on file  Social Connections: Not on file   Allergies  Allergen Reactions  . Codeine Other (See Comments)    unknown  . Penicillins Other (See Comments)    unknown  . Vancomycin Other (See Comments)    unknown  . Zithromax [Azithromycin] Other (See Comments)    unknown  . Latex Rash   Family History  Problem Relation Age of Onset  . Cancer Mother   . Dementia Mother     Current Outpatient Medications (Endocrine & Metabolic):  .  levothyroxine (SYNTHROID) 75 MCG tablet, TAKE 1 TABLET BY MOUTH DAILY BEFORE BREAKFAST.   Current Outpatient Medications (Cardiovascular):  .  diltiazem (CARDIZEM SR) 120 MG 12 hr capsule, Take 1 capsule (120 mg total) by mouth 2 (two) times daily. .  furosemide (LASIX) 20 MG tablet, Take 1 tablet (20 mg total) by mouth every other day. .  pravastatin (PRAVACHOL) 10 MG tablet, Take 1 tablet (10 mg total) by mouth daily.     Current Outpatient Medications (Analgesics):  .  acetaminophen (TYLENOL) 325 MG tablet, Take 1-2 tablets (325-650 mg total) by mouth every 6 (six) hours as needed for mild pain (pain score 1-3 or temp > 100.5).   Current Outpatient Medications (Hematological):  Marland Kitchen  Rivaroxaban (XARELTO) 15 MG TABS tablet, Take 1 tablet (15 mg total) by mouth daily with supper.  Current Facility-Administered Medications (Hematological):  .  cyanocobalamin ((VITAMIN B-12)) injection 1,000 mcg  Current Outpatient Medications (Other):  .  acidophilus (RISAQUAD) CAPS capsule, Take 1 capsule by mouth daily. Marland Kitchen  acyclovir ointment (ZOVIRAX) 5 %, Apply 1 application topically 2 (two) times daily as needed. .  B Complex-C (B-COMPLEX WITH VITAMIN C) tablet, Take 1 tablet by mouth  daily. .  polyethylene glycol (MIRALAX) 17 g packet, Take 17 g by mouth daily. Hold for loose bowel movements .  potassium chloride (KLOR-CON) 10 MEQ tablet, Take 1 tablet (10 mEq total) by mouth every other day. .  Vitamin D, Ergocalciferol, (DRISDOL) 1.25 MG (50000 UNIT) CAPS capsule, Take 1 capsule (50,000 Units total) by mouth every 7 (seven) days. (Patient taking differently: Take 50,000 Units by mouth every 7 (seven) days. Thursday)    Reviewed prior external information including notes and imaging from  primary care provider As well as notes that were available from care everywhere and other healthcare systems.  Past medical history, social, surgical and family history all reviewed in electronic medical record.  No pertanent information unless stated regarding to the chief complaint.   Review of Systems:  No headache, visual changes, nausea, vomiting, diarrhea, constipation, dizziness, , skin rash, fevers, chills, night sweats, weight loss, swollen lymph nodes,, joint swelling, chest pain, shortness  of breath, mood changes. POSITIVE muscle aches, body aches, abdominal pain  Objective  Blood pressure 124/70, pulse (!) 101, height 5\' 3"  (1.6 m), weight 135 lb (61.2 kg), SpO2 99 %.   General: No apparent distress alert and oriented x3 mood and affect normal, dressed appropriately.  HEENT: Pupils equal, extraocular movements intact  Respiratory: Patient's speak in full sentences and does not appear short of breath  Cardiovascular: Trace lower extremity edema, non tender, no erythema  Gait ambulates with the aid of a walker MSK: Patient continues having discomfort more over the right anterior lateral rib cage area.  Patient does have some lidocaine patches in the area.  Diffuse mild tenderness noted.  No crepitus noted.  No masses appreciated. Patient is somewhat tender to palpation in the left upper quadrant but no rebound tenderness noted.   Impression and Recommendations:     The  above documentation has been reviewed and is accurate and complete , DO

## 2021-01-29 ENCOUNTER — Encounter: Payer: Self-pay | Admitting: Family Medicine

## 2021-01-29 ENCOUNTER — Ambulatory Visit: Payer: Medicare Other | Admitting: Family Medicine

## 2021-01-29 DIAGNOSIS — R0782 Intercostal pain: Secondary | ICD-10-CM | POA: Diagnosis not present

## 2021-01-29 NOTE — Patient Instructions (Addendum)
Good to see you After we have the CT we will send a message in mychart If worsening pain seek medical attention

## 2021-01-30 ENCOUNTER — Inpatient Hospital Stay: Admission: RE | Admit: 2021-01-30 | Payer: Medicare Other | Source: Ambulatory Visit

## 2021-01-31 ENCOUNTER — Encounter: Payer: Self-pay | Admitting: Family Medicine

## 2021-02-04 ENCOUNTER — Other Ambulatory Visit: Payer: Self-pay

## 2021-02-04 ENCOUNTER — Ambulatory Visit (INDEPENDENT_AMBULATORY_CARE_PROVIDER_SITE_OTHER)
Admission: RE | Admit: 2021-02-04 | Discharge: 2021-02-04 | Disposition: A | Discharge status: Home / Self Care | Payer: Medicare Other | Source: Ambulatory Visit | Attending: Family Medicine | Admitting: Family Medicine

## 2021-02-04 DIAGNOSIS — R0782 Intercostal pain: Secondary | ICD-10-CM

## 2021-02-21 ENCOUNTER — Encounter: Payer: Self-pay | Admitting: Internal Medicine

## 2021-02-27 ENCOUNTER — Other Ambulatory Visit: Payer: Self-pay

## 2021-02-27 ENCOUNTER — Ambulatory Visit (INDEPENDENT_AMBULATORY_CARE_PROVIDER_SITE_OTHER): Payer: Medicare Other

## 2021-02-27 DIAGNOSIS — E538 Deficiency of other specified B group vitamins: Secondary | ICD-10-CM | POA: Diagnosis not present

## 2021-02-27 DIAGNOSIS — Z23 Encounter for immunization: Secondary | ICD-10-CM

## 2021-02-27 NOTE — Progress Notes (Signed)
Pt here for monthly B12 injection and pneumovax per Dr Lawerance Bach.   B12 given IM left deltoid pneumovax given IM Right Deltoid and pt tolerated injections well.

## 2021-03-05 DIAGNOSIS — Z23 Encounter for immunization: Secondary | ICD-10-CM

## 2021-03-05 NOTE — Addendum Note (Signed)
Addended by: Casper Harrison on: 03/05/2021 10:35 AM   Modules accepted: Orders

## 2021-03-27 ENCOUNTER — Other Ambulatory Visit: Payer: Self-pay

## 2021-03-27 ENCOUNTER — Ambulatory Visit (INDEPENDENT_AMBULATORY_CARE_PROVIDER_SITE_OTHER): Payer: Medicare Other

## 2021-03-27 DIAGNOSIS — E538 Deficiency of other specified B group vitamins: Secondary | ICD-10-CM | POA: Diagnosis not present

## 2021-03-27 NOTE — Progress Notes (Signed)
Patient came into the office today to receive her b12 shot. She tolerated the injection well.

## 2021-03-28 ENCOUNTER — Ambulatory Visit: Payer: Medicare Other | Admitting: Podiatry

## 2021-03-28 DIAGNOSIS — Z7901 Long term (current) use of anticoagulants: Secondary | ICD-10-CM

## 2021-03-28 DIAGNOSIS — M79675 Pain in left toe(s): Secondary | ICD-10-CM | POA: Diagnosis not present

## 2021-03-28 DIAGNOSIS — B351 Tinea unguium: Secondary | ICD-10-CM

## 2021-03-28 DIAGNOSIS — M79674 Pain in right toe(s): Secondary | ICD-10-CM | POA: Diagnosis not present

## 2021-03-28 NOTE — Progress Notes (Signed)
b12 Injection given.   Hollis Oh J Jone Panebianco, MD  

## 2021-04-04 NOTE — Progress Notes (Signed)
Subjective: 87 y.o. returns the office today for painful, elongated, thickened toenails which she cannot trim herself. Denies any redness or drainage around the nails.  States that she stubbed her toe this morning on a walker and had pain immediately afterwards but the pain is since resolved.  No opening or any bleeding.  Denies any acute changes since last appointment and no new complaints today. Denies any systemic complaints such as fevers, chills, nausea, vomiting.   PCP: Pincus Sanes, MD Last seen 12/18/2020  Objective: NAD- presents with caregiver DP/PT pulses palpable, CRT less than 3 seconds Nails hypertrophic, dystrophic, elongated, brittle, discolored 10. There is tenderness overlying the nails 1-5 on the left and 1-5 on the right. There is no surrounding erythema or drainage along the nail sites. Minimal hyperkeratotic tissue medial first MPJ.  No ulcerations identified No pain on the toe that she stubbed there is no swelling or redness or any open lesion. No open lesions or pre-ulcerative lesions are identified. No other areas of discomfort to bilateral lower extremities today.  No pain with calf compression, swelling, warmth, erythema.  Assessment: Patient presents with symptomatic onychomycosis  Plan: -Treatment options including alternatives, risks, complications were discussed -Nails sharply debrided x 10 without complication/bleeding. -Moisturizer daily to the calluses.  No significant callus or debried. -Discussed daily foot inspection. If there are any changes, to call the office immediately.  -Follow-up in 3 months or sooner if any problems are to arise. In the meantime, encouraged to call the office with any questions, concerns, changes symptoms.  Ovid Curd, DPM

## 2021-04-13 ENCOUNTER — Other Ambulatory Visit: Payer: Self-pay | Admitting: Cardiology

## 2021-04-20 ENCOUNTER — Encounter: Payer: Self-pay | Admitting: Internal Medicine

## 2021-04-29 ENCOUNTER — Ambulatory Visit: Payer: Medicare Other

## 2021-05-01 ENCOUNTER — Other Ambulatory Visit: Payer: Self-pay

## 2021-05-01 ENCOUNTER — Ambulatory Visit (INDEPENDENT_AMBULATORY_CARE_PROVIDER_SITE_OTHER): Payer: Medicare Other

## 2021-05-01 DIAGNOSIS — E538 Deficiency of other specified B group vitamins: Secondary | ICD-10-CM | POA: Diagnosis not present

## 2021-05-01 NOTE — Progress Notes (Signed)
Pt here for monthly B12 injection per Dr Lawerance Bach.  B12 given IM in right deltoid and pt tolerated injection well.  Next B12 injection scheduled for 06/03/21.

## 2021-05-26 NOTE — Progress Notes (Signed)
Cardiology Clinic Note   Patient Name: Grace Alvarado Date of Encounter: 05/27/2021  Primary Care Provider:  Pincus Sanes, MD Primary Cardiologist:  Olga Millers, MD  Patient Profile    Grace Alvarado 87 year old female presents to clinic today for follow-up evaluation of her chronic atrial fibrillation and hypertension.  Past Medical History    Past Medical History:  Diagnosis Date   Atrial fibrillation (HCC)    Hyperlipidemia    Hypertension    Hypothyroidism    Mitral regurgitation    MVP (mitral valve prolapse)    Past Surgical History:  Procedure Laterality Date   APPENDECTOMY     c section     CESAREAN SECTION     CHOLECYSTECTOMY     COLONOSCOPY WITH PROPOFOL N/A 11/09/2018   Procedure: COLONOSCOPY WITH PROPOFOL;  Surgeon: Sherrilyn Rist, MD;  Location: [facility] ENDOSCOPY;  Service: Gastroenterology;  Laterality: N/A;   ESOPHAGOGASTRODUODENOSCOPY (EGD) WITH PROPOFOL N/A 11/09/2018   Procedure: ESOPHAGOGASTRODUODENOSCOPY (EGD) WITH PROPOFOL;  Surgeon: Sherrilyn Rist, MD;  Location: [facility] ENDOSCOPY;  Service: Gastroenterology;  Laterality: N/A;   HOT HEMOSTASIS N/A 11/09/2018   Procedure: HOT HEMOSTASIS (ARGON PLASMA COAGULATION/BICAP);  Surgeon: Sherrilyn Rist, MD;  Location: [facility] ENDOSCOPY;  Service: Gastroenterology;  Laterality: N/A;   I & D EXTREMITY Left 03/02/2020   Procedure: evacuation of left knee hematoma, possible wound vac placement;  Surgeon: Cammy Copa, MD;  Location: [facility] OR;  Service: Orthopedics;  Laterality: Left;   SUBMUCOSAL INJECTION  11/09/2018   Procedure: SUBMUCOSAL INJECTION;  Surgeon: Sherrilyn Rist, MD;  Location: [facility] ENDOSCOPY;  Service: Gastroenterology;;    Allergies  Allergies  Allergen Reactions   Codeine Other (See Comments)    unknown   Penicillins Other (See Comments)    unknown   Vancomycin Other (See Comments)    unknown   Zithromax [Azithromycin] Other (See Comments)    unknown   Latex Rash    History of Present  Illness    Grace Alvarado has a PMH of chronic atrial fibrillation, essential hypertension, mitral valve insufficiency moderate-severe (does not want to consider surgery), coronary artery disease, CHF, and carotid atherosclerosis.  She is originally from the [place].  She previously failed antiarrhythmic therapy.  She was seen 3/21 with complaints of leg pain during walking.  Her ABIs were normal.  In May 2021 she fell at home.  She suffered a traumatic patellar bursitis.  She ended up being admitted to the hospital for a week.  She required a wound VAC and evacuation of hematoma.  Her course was complicated by transient hyponatremia and urinary retention.  She was ultimately discharged to a skilled nursing facility.  She went to assisted living and then transition back to her own apartment.  Her daughter checks on her daily.  She was seen by Corine Shelter, PA-C on 11/27/2020.  During that time she continued to do well.  She denied any dyspnea or tachycardia.  She did note some localized left chest pain that was atypical.  Her echocardiogram 11/04/2018 showed an LVEF of 60-65% moderately dilated left and right atria, moderately myxomatous posterior prolapse and moderate-severe MR.  Her EKG showed atrial fibrillation with a ventricular rate of 85 bpm.  She presents the clinic today for follow-up evaluation states she feels well.  She presents the clinic today with her daughter.  She reports that she does have some dietary indiscretion however tries to maintain the low-salt diet.  She uses a Occupational hygienist  walker.  Her daughter reports some concern about heart failure and weight gain.  On assessment she has no swelling in her ankles and denies shortness of breath.  We reviewed her echocardiogram.  We also talked about her mitral valve.  She does not want to take extreme measures at this point.  I will have her maintain her low-salt diet, maintain her physical activity, and follow-up in 6 months.  Today she denies chest  pain, shortness of breath, lower extremity edema, fatigue, palpitations, melena, hematuria, hemoptysis, diaphoresis, weakness, presyncope, syncope, orthopnea, and PND.   Home Medications    Prior to Admission medications   Medication Sig Start Date End Date Taking? Authorizing Provider  furosemide (LASIX) 20 MG tablet TAKE 1 TABLET BY MOUTH EVERY OTHER DAY 04/15/21   Lewayne Bunting, MD  acetaminophen (TYLENOL) 325 MG tablet Take 1-2 tablets (325-650 mg total) by mouth every 6 (six) hours as needed for mild pain (pain score 1-3 or temp > 100.5). 03/08/20   Joseph Art, DO  acidophilus (RISAQUAD) CAPS capsule Take 1 capsule by mouth daily. 03/08/20   Joseph Art, DO  acyclovir ointment (ZOVIRAX) 5 % Apply 1 application topically 2 (two) times daily as needed. 10/09/20   Judi Saa, DO  B Complex-C (B-COMPLEX WITH VITAMIN C) tablet Take 1 tablet by mouth daily. 03/08/20   Joseph Art, DO  diltiazem (CARDIZEM SR) 120 MG 12 hr capsule Take 1 capsule (120 mg total) by mouth 2 (two) times daily. 07/24/20   Lewayne Bunting, MD  levothyroxine (SYNTHROID) 75 MCG tablet TAKE 1 TABLET BY MOUTH DAILY BEFORE BREAKFAST. 01/10/21   Burns, Bobette Mo, MD  polyethylene glycol (MIRALAX) 17 g packet Take 17 g by mouth daily. Hold for loose bowel movements 04/17/20   Pincus Sanes, MD  potassium chloride (KLOR-CON) 10 MEQ tablet Take 1 tablet (10 mEq total) by mouth every other day. 07/18/20   Lewayne Bunting, MD  pravastatin (PRAVACHOL) 10 MG tablet Take 1 tablet (10 mg total) by mouth daily. 07/18/20   Lewayne Bunting, MD  Rivaroxaban (XARELTO) 15 MG TABS tablet Take 1 tablet (15 mg total) by mouth daily with supper. 07/18/20   Lewayne Bunting, MD  Vitamin D, Ergocalciferol, (DRISDOL) 1.25 MG (50000 UNIT) CAPS capsule Take 1 capsule (50,000 Units total) by mouth every 7 (seven) days. Patient taking differently: Take 50,000 Units by mouth every 7 (seven) days. Thursday 01/24/20   Pincus Sanes, MD     Family History    Family History  Problem Relation Age of Onset   Cancer Mother    Dementia Mother    She indicated that her mother is deceased. She indicated that her father is deceased.  Social History    Social History   Socioeconomic History   Marital status: Widowed    Spouse name: Not on file   Number of children: 3   Years of education: Not on file   Highest education level: Not on file  Occupational History   Occupation: retired  Tobacco Use   Smoking status: Former   Smokeless tobacco: Never  Building services engineer Use: Never used  Substance and Sexual Activity   Alcohol use: Not Currently   Drug use: Never   Sexual activity: Not Currently  Other Topics Concern   Not on file  Social History Narrative   Not on file   Social Determinants of Health   Financial Resource Strain: Not on file  Food Insecurity: Not on file  Transportation Needs: Not on file  Physical Activity: Not on file  Stress: Not on file  Social Connections: Not on file  Intimate Partner Violence: Not on file     Review of Systems    General:  No chills, fever, night sweats or weight changes.  Cardiovascular:  No chest pain, dyspnea on exertion, edema, orthopnea, palpitations, paroxysmal nocturnal dyspnea. Dermatological: No rash, lesions/masses Respiratory: No cough, dyspnea Urologic: No hematuria, dysuria Abdominal:   No nausea, vomiting, diarrhea, bright red blood per rectum, melena, or hematemesis Neurologic:  No visual changes, wkns, changes in mental status. All other systems reviewed and are otherwise negative except as noted above.  Physical Exam    VS:  BP 135/78   Pulse 93   Resp 20   Ht 5\' 3"  (1.6 m)   Wt 137 lb (62.1 kg)   LMP  (LMP Unknown)   SpO2 98%   BMI 24.27 kg/m  , BMI Body mass index is 24.27 kg/m. GEN: Well nourished, well developed, in no acute distress. HEENT: normal. Neck: Supple, no JVD, carotid bruits, or masses. Cardiac: Irregularly irregular,  no murmurs, rubs, or gallops. No clubbing, cyanosis, edema.  Radials/DP/PT 2+ and equal bilaterally.  Respiratory:  Respirations regular and unlabored, clear to auscultation bilaterally. GI: Soft, nontender, nondistended, BS + x 4. MS: no deformity or atrophy. Skin: warm and dry, no rash. Neuro:  Strength and sensation are intact. Psych: Normal affect.  Accessory Clinical Findings    Recent Labs: 12/18/2020: ALT 34; BUN 11; Creatinine, Ser 0.59; Hemoglobin 12.8; Platelets 136.0; Potassium 4.4; Sodium 138; TSH 3.19   Recent Lipid Panel    Component Value Date/Time   CHOL 121 12/18/2020 1226   TRIG 55.0 12/18/2020 1226   HDL 58.30 12/18/2020 1226   CHOLHDL 2 12/18/2020 1226   VLDL 11.0 12/18/2020 1226   LDLCALC 52 12/18/2020 1226    ECG personally reviewed by me today-none today.  Echocardiogram 11/04/2018 IMPRESSIONS     1. The left ventricle has normal systolic function of 60-65%. The cavity  size is normal. There is no left ventricular wall thickness. Echo evidence  of normal diastolic filling patterns.   2. Right ventricular systolic pressure is is moderately elevated.   3. Moderately dilated left atrial size.   4. Moderately dilated right atrial size.   5. Mitral valve regurgitation is moderate to severe by color flow  Doppler.   6. The mitral valve is myxomatous.   7. There is moderate thickening of the mitral valve.   8. Moderately myxomatous and thickened with posterior prolapse and  moderate to severe MR.   9. Normal tricuspid valve.  10. Aortic valve normal.  11. No atrial level shunt detected by color flow Doppler.   Assessment & Plan   1.  Atrial fibrillation-heart rate today 93 bmp.  No recent episodes of increased heart rate, presyncope or syncope.  Reports compliance with Xarelto and denies bleeding issues. Continue Xarelto, diltiazem Heart healthy low-sodium diet-salty 6 given Increase physical activity as tolerated Avoid triggers caffeine, chocolate,  EtOH, dehydration etc.  Essential hypertension-BP today 135/78.  Well-controlled at home. Continue diltiazem Heart healthy low-sodium diet-salty 6 given Increase physical activity as tolerated  Mitral valve regurgitation-on last echocardiogram moderate-severe MR.  Does not want to undergo surgery.  Disposition: Follow-up with Dr. Jens Somrenshaw or me in 6 months.   Thomasene RippleJesse M. Koran Seabrook NP-C    05/27/2021, 3:10 PM [facility] [address] Office [phone] Fax [phone]  Notice: This dictation was prepared with Dragon dictation along with smaller phrase technology. Any transcriptional errors that result from this process are unintentional and may not be corrected upon review.  I spent 13 minutes examining this patient, reviewing medications, and using patient centered shared decision making involving her cardiac care.  Prior to her visit I spent greater than 20 minutes reviewing her past medical history,  medications, and prior cardiac tests.

## 2021-05-27 ENCOUNTER — Ambulatory Visit: Payer: Medicare Other | Admitting: General Practice

## 2021-05-27 ENCOUNTER — Encounter: Payer: Self-pay | Admitting: General Practice

## 2021-05-27 ENCOUNTER — Other Ambulatory Visit: Payer: Self-pay

## 2021-05-27 VITALS — BP 135/78 | HR 93 | Resp 20 | Ht 63.0 in | Wt 137.0 lb

## 2021-05-27 DIAGNOSIS — I340 Nonrheumatic mitral (valve) insufficiency: Secondary | ICD-10-CM

## 2021-05-27 DIAGNOSIS — I482 Chronic atrial fibrillation, unspecified: Secondary | ICD-10-CM | POA: Diagnosis not present

## 2021-05-27 DIAGNOSIS — I10 Essential (primary) hypertension: Secondary | ICD-10-CM | POA: Diagnosis not present

## 2021-05-27 NOTE — Patient Instructions (Signed)
Medication Instructions:  The current medical regimen is effective;  continue present plan and medications as directed. Please refer to the Current Medication list given to you today.   *If you need a refill on your cardiac medications before your next appointment, please call your pharmacy*  Lab Work:   Testing/Procedures:  NONE    NONE  Special Instructions PLEASE READ AND FOLLOW HEART HEALTHY DIET-ATTACHED  PLEASE MAINTAIN PHYSICAL ACTIVITY AS TOLERATED,   Follow-Up: Your next appointment:  6 month(s) In Person with Olga Millers, MD OR IF UNAVAILABLE JESSE CLEAVER, FNP-C   At [facility], you and your health needs are our priority.  As part of our continuing mission to provide you with exceptional heart care, we have created designated Provider Care Teams.  These Care Teams include your primary Cardiologist (physician) and Advanced Practice Providers (APPs -  Physician Assistants and Nurse Practitioners) who all work together to provide you with the care you need, when you need it.        Heart-Healthy Eating Plan Heart-healthy meal planning includes: Eating less unhealthy fats. Eating more healthy fats. Making other changes in your diet. Talk with your doctor or a diet specialist (dietitian) to create an eating plan that is right for you. What is my plan? What are tips for following this plan? Cooking Avoid frying your food. Try to bake, boil, grill, or broil it instead. You can also reduce fat by: Removing the skin from poultry. Removing all visible fats from meats. Steaming vegetables in water or broth. Meal planning  At meals, divide your plate into four equal parts: Fill one-half of your plate with vegetables and green salads. Fill one-fourth of your plate with whole grains. Fill one-fourth of your plate with lean protein foods. Eat 4-5 servings of vegetables per day. A serving of vegetables is: 1 cup of raw or cooked vegetables. 2 cups of raw leafy greens. Eat  4-5 servings of fruit per day. A serving of fruit is: 1 medium whole fruit.  cup of dried fruit.  cup of fresh, frozen, or canned fruit.  cup of 100% fruit juice. Eat more foods that have soluble fiber. These are apples, broccoli, carrots, beans, peas, and barley. Try to get 20-30 g of fiber per day. Eat 4-5 servings of nuts, legumes, and seeds per week: 1 serving of dried beans or legumes equals  cup after being cooked. 1 serving of nuts is  cup. 1 serving of seeds equals 1 tablespoon.  General information Eat more home-cooked food. Eat less restaurant, buffet, and fast food. Limit or avoid alcohol. Limit foods that are high in starch and sugar. Avoid fried foods. Lose weight if you are overweight. Keep track of how much salt (sodium) you eat. This is important if you have high blood pressure. Ask your doctor to tell you more about this. Try to add vegetarian meals each week. Fats Choose healthy fats. These include olive oil and canola oil, flaxseeds, walnuts, almonds, and seeds. Eat more omega-3 fats. These include salmon, mackerel, sardines, tuna, flaxseed oil, and ground flaxseeds. Try to eat fish at least 2 times each week. Check food labels. Avoid foods with trans fats or high amounts of saturated fat. Limit saturated fats. These are often found in animal products, such as meats, butter, and cream. These are also found in plant foods, such as palm oil, palm kernel oil, and coconut oil. Avoid foods with partially hydrogenated oils in them. These have trans fats. Examples are stick margarine, some  tub margarines, cookies, crackers, and other baked goods. What foods can I eat? Fruits All fresh, canned (in natural juice), or frozen fruits. Vegetables Fresh or frozen vegetables (raw, steamed, roasted, or grilled). Green salads. Grains Most grains. Choose whole wheat and whole grains most of the time. Rice andpasta, including brown rice and pastas made with whole wheat. Meats  and other proteins Lean, well-trimmed beef, veal, pork, and lamb. Chicken and [place] without skin. All fish and shellfish. Wild duck, rabbit, pheasant, and venison. Egg whites or low-cholesterol egg substitutes. Dried beans, peas, lentils, and tofu. Seedsand most nuts. Dairy Low-fat or nonfat cheeses, including ricotta and mozzarella. Skim or 1% milk that is liquid, powdered, or evaporated. Buttermilk that is made with low-fatmilk. Nonfat or low-fat yogurt. Fats and oils Non-hydrogenated (trans-free) margarines. Vegetable oils, including soybean, sesame, sunflower, olive, peanut, safflower, corn, canola, and cottonseed. Salad dressings or mayonnaisemade with a vegetable oil. Beverages Mineral water. Coffee and tea. Diet carbonated beverages. Sweets and desserts Sherbet, gelatin, and fruit ice. Small amounts of dark chocolate. Limit all sweets and desserts. Seasonings and condiments All seasonings and condiments. The items listed above may not be a complete list of foods and drinks you can eat. Contact a dietitian for more options. What foods should I avoid? Fruits Canned fruit in heavy syrup. Fruit in cream or butter sauce. Fried fruit. Limitcoconut. Vegetables Vegetables cooked in cheese, cream, or butter sauce. Fried vegetables. Grains Breads that are made with saturated or trans fats, oils, or whole milk. Croissants. Sweet rolls. Donuts. High-fat crackers,such as cheese crackers. Meats and other proteins Fatty meats, such as hot dogs, ribs, sausage, bacon, rib-eye roast or steak. High-fat deli meats, such as salami and bologna. Caviar. Domestic duck andgoose. Organ meats, such as liver. Dairy Cream, sour cream, cream cheese, and creamed cottage cheese. Whole-milk cheeses. Whole or 2% milk that is liquid, evaporated, or condensed. Whole buttermilk. Cream sauce or high-fat cheese sauce. Yogurt that is made fromwhole milk. Fats and oils Meat fat, or shortening. Cocoa butter, hydrogenated  oils, palm oil, coconut oil, palm kernel oil. Solid fats and shortenings, including bacon fat, salt pork, lard, and butter. Nondairy cream substitutes. Salad dressings with cheeseor sour cream. Beverages Regular sodas and juice drinks with added sugar. Sweets and desserts Frosting. Pudding. Cookies. Cakes. Pies. Milk chocolate or white chocolate.Buttered syrups. Full-fat ice cream or ice cream drinks. The items listed above may not be a complete list of foods and drinks to avoid. Contact a dietitian for more information. Summary Heart-healthy meal planning includes eating less unhealthy fats, eating more healthy fats, and making other changes in your diet. Eat a balanced diet. This includes fruits and vegetables, low-fat or nonfat dairy, lean protein, nuts and legumes, whole grains, and heart-healthy oils and fats. This information is not intended to replace advice given to you by your health care provider. Make sure you discuss any questions you have with your healthcare provider. Document Revised: 11/26/2017 Document Reviewed: 10/30/2017 Elsevier Patient Education  2022 [employer].

## 2021-05-30 ENCOUNTER — Ambulatory Visit: Payer: Medicare Other | Admitting: Podiatry

## 2021-06-03 ENCOUNTER — Ambulatory Visit (INDEPENDENT_AMBULATORY_CARE_PROVIDER_SITE_OTHER): Payer: Medicare Other

## 2021-06-03 ENCOUNTER — Other Ambulatory Visit: Payer: Self-pay

## 2021-06-03 DIAGNOSIS — E538 Deficiency of other specified B group vitamins: Secondary | ICD-10-CM | POA: Diagnosis not present

## 2021-06-03 NOTE — Progress Notes (Signed)
Pt here for monthly B12 injection per Dr Lawerance Bach.  B12 given IM right deltoid and pt tolerated injection well.  Next B12 injection scheduled for 06/25/21 during office visit with PCP.

## 2021-06-04 ENCOUNTER — Ambulatory Visit: Payer: Medicare Other | Admitting: Podiatry

## 2021-06-04 ENCOUNTER — Ambulatory Visit: Payer: Medicare Other | Admitting: Podiatrist

## 2021-06-04 ENCOUNTER — Encounter: Payer: Self-pay | Admitting: Podiatrist

## 2021-06-04 DIAGNOSIS — M79675 Pain in left toe(s): Secondary | ICD-10-CM | POA: Diagnosis not present

## 2021-06-04 DIAGNOSIS — B351 Tinea unguium: Secondary | ICD-10-CM

## 2021-06-04 DIAGNOSIS — Z7901 Long term (current) use of anticoagulants: Secondary | ICD-10-CM

## 2021-06-04 DIAGNOSIS — M79674 Pain in right toe(s): Secondary | ICD-10-CM

## 2021-06-04 NOTE — Progress Notes (Signed)
Chief Complaint  Patient presents with   Nail Problem    Routine foot care  1-5 nail trim bilateral       HPI: 87 y.o. returns the office today for painful, elongated, thickened toenails which she cannot trim herself. Denies any redness or drainage around the nails.  Denies any acute changes since last appointment and no new complaints today. Denies any systemic complaints such as fevers, chills, nausea, vomiting.    PCP: Pincus Sanes, MD    Allergies  Allergen Reactions   Codeine Other (See Comments)    unknown   Penicillins Other (See Comments)    unknown   Vancomycin Other (See Comments)    unknown   Zithromax [Azithromycin] Other (See Comments)    unknown   Latex Rash    Review of systems is reviewed and negative.   Physical Exam  Patient is awake, alert, and oriented x 3.  In no acute distress.    DP/PT pulses palpable, CRT less than 3 seconds Nails hypertrophic, dystrophic, elongated, brittle, discolored 10. There is tenderness overlying the nails 1-5 on the left and 1-5 on the right. There is no surrounding erythema or drainage along the nail sites. Minimal hyperkeratotic tissue medial first MPJ.  No ulcerations identified No pain on the toe that she stubbed there is no swelling or redness or any open lesion. No open lesions or pre-ulcerative lesions are identified. No other areas of discomfort to bilateral lower extremities today.  No pain with calf compression, swelling, warmth, erythema   Assessment: 1. Pain in toes of both feet   2. Dermatophytosis of nail   3. Chronic anticoagulation      Plan: Nails debrided in thickness and length with a nail nipper and power bur without complication.  Slight amount of callus was pared with 15 blade very minimally.  Recommended continued use of topical moisturizers.  She will be seen back in 3 months for follow-up and will call sooner if any redness, swelling, pain or other problems arise.

## 2021-06-04 NOTE — Patient Instructions (Signed)

## 2021-06-11 ENCOUNTER — Ambulatory Visit: Payer: Medicare Other | Admitting: Sports Medicine

## 2021-06-11 ENCOUNTER — Other Ambulatory Visit: Payer: Self-pay

## 2021-06-11 VITALS — BP 122/60 | HR 80 | Ht 63.0 in | Wt 142.8 lb

## 2021-06-11 DIAGNOSIS — I482 Chronic atrial fibrillation, unspecified: Secondary | ICD-10-CM | POA: Diagnosis not present

## 2021-06-11 DIAGNOSIS — R635 Abnormal weight gain: Secondary | ICD-10-CM

## 2021-06-11 DIAGNOSIS — M5431 Sciatica, right side: Secondary | ICD-10-CM

## 2021-06-11 NOTE — Progress Notes (Signed)
Aleen Sells D.[name] [address] [place] [address] Phone: [phone]   Assessment and Plan:     1. Weight gain 2. Chronic a-fib -Acute - Weight increased from 137 pounds on 05/27/2021 to 142 pounds on 06/11/2021 - Patient in A. fib in clinic with history of chronic A. fib, trace bilateral pitting edema, no shortness of breath, normal lung exam, and otherwise unremarkable heart exam. - No signs of CHF in clinic, so patient advised to follow-up with cardiology and PCP -Echo in 2020 shows EF 60 to 65%  3. Right sided sciatica -Acute on chronic, unchanged - Likely flare of right-sided sciatica based on HPI, physical exam - Continue Tylenol and heating pad - May advance activity as tolerated - Advised to use a 4 pronged walker for all transfers and ambulation    All pertinent previous records reviewed prior to and during visit.    Follow Up: If no improvement or worsening of symptoms   Subjective:    Chief Complaint: Right-sided low back and sciatica pain  HPI:   06/11/21 Patient presents for evaluation of acute right lower back radiating to right leg pain.  She states that this is similar to her sciatica presentations in the past.  Pain started on Saturday, 3 days ago.  Patient mopped around her house on the Thursday and Friday prior to pain starting and thinks she may have overdone it.  Denies numbness/tingling/weakness into feet.  Denies urinary incontinence, saddle paresthesia, legs giving out from under her.  She resents today because she is worried that her discomfort may lead to a fall.  Currently using 4 pronged walker for ambulation around the house and when leaving house.  Uses Tylenol as needed for pain control.  Is hesitant to use gabapentin due to side effects.  Does not take NSAIDs due to age and chronic conditions.    Relevant Historical Information: Additional concern of weight gain.  Patient has gained 5 to 6 pounds  over the past 2 weeks without trying.  History of chronic A. fib and CAD.  Last echo in 2020 with EF 60-65%.  Denies chest pain, shortness of breath.  Endorses mild swelling in lower extremities  Additional pertinent review of systems negative.   Current Outpatient Medications:    acetaminophen (TYLENOL) 325 MG tablet, Take 1-2 tablets (325-650 mg total) by mouth every 6 (six) hours as needed for mild pain (pain score 1-3 or temp > 100.5)., Disp:  , Rfl:    acidophilus (RISAQUAD) CAPS capsule, Take 1 capsule by mouth daily., Disp:  , Rfl:    acyclovir ointment (ZOVIRAX) 5 %, Apply 1 application topically 2 (two) times daily as needed., Disp: 5 g, Rfl: 0   B Complex-C (B-COMPLEX WITH VITAMIN C) tablet, Take 1 tablet by mouth daily., Disp:  , Rfl:    diltiazem (CARDIZEM SR) 120 MG 12 hr capsule, Take 1 capsule (120 mg total) by mouth 2 (two) times daily., Disp: 180 capsule, Rfl: 3   furosemide (LASIX) 20 MG tablet, TAKE 1 TABLET BY MOUTH EVERY OTHER DAY, Disp: 45 tablet, Rfl: 3   levothyroxine (SYNTHROID) 75 MCG tablet, TAKE 1 TABLET BY MOUTH DAILY BEFORE BREAKFAST., Disp: 90 tablet, Rfl: 1   polyethylene glycol (MIRALAX) 17 g packet, Take 17 g by mouth daily. Hold for loose bowel movements, Disp: 60 each, Rfl: 0   potassium chloride (KLOR-CON) 10 MEQ tablet, Take 1 tablet (10 mEq total) by mouth every other day., Disp:  45 tablet, Rfl: 3   pravastatin (PRAVACHOL) 10 MG tablet, Take 1 tablet (10 mg total) by mouth daily., Disp: 90 tablet, Rfl: 3   Rivaroxaban (XARELTO) 15 MG TABS tablet, Take 1 tablet (15 mg total) by mouth daily with supper., Disp: 90 tablet, Rfl: 3   Vitamin D, Ergocalciferol, (DRISDOL) 1.25 MG (50000 UNIT) CAPS capsule, Take 1 capsule (50,000 Units total) by mouth every 7 (seven) days. (Patient taking differently: Take 50,000 Units by mouth every 7 (seven) days. Thursday), Disp: 8 capsule, Rfl: 0  Current Facility-Administered Medications:    cyanocobalamin ((VITAMIN B-12))  injection 1,000 mcg, 1,000 mcg, Intramuscular, Q30 days, Burns, Stacy J, MD, 1,000 mcg at 06/03/21 1405   Objective:     Vitals:   06/11/21 0935  BP: 122/60  Pulse: 80  SpO2: 97%  Weight: 142 lb 12.8 oz (64.8 kg)  Height: 5\' 3"  (1.6 m)      Body mass index is 25.3 kg/m.    Physical Exam:    Gen: Appears well, nad, nontoxic and pleasant Cardio: Irregularly irregular rhythm with holosystolic murmur 3/6.  Bilateral trace pitting edema in lower extremities Lung: Clear to auscultation in all fields.  Breathing unlabored Psych: Alert and oriented, appropriate mood and affect Neuro: sensation intact, strength is 5/5 in upper and lower extremities, muscle tone wnl Skin: no susupicious lesions or rashes  Back - Normal skin, Spine with normal alignment and no deformity.   No tenderness to vertebral process palpation.   Paraspinous muscles are tender bilaterally and without spasm TTP right gluteal musculature Straight leg raise negative Trendelenberg negative    Electronically signed by:  D.[facility] 10:58 AM 06/11/21

## 2021-06-11 NOTE — Patient Instructions (Addendum)
Good to see you  I agree I think it is a sciatic flare Rest for 1 week Slowly re-introduce walking   Use tylenol for pain as needed Heating pad on low back right glute Always use walker 5 pound weight gain in 2 weeks, no SOB no fluid heard on exam. No trace pitting edema See again when needed

## 2021-06-11 NOTE — Progress Notes (Deleted)
  Ben Nino Amano D.O. CAQSM [facility] [address] [place] [address] Phone: [phone]   Assessment and Plan:     1. Weight gain 2. Chronic a-fib -Acute - Weight increased from 137 pounds on 05/27/2021 to 142 pounds on 06/11/2021 - Patient in A. fib in clinic with history of chronic A. fib, trace bilateral pitting edema, no shortness of breath, normal lung exam, and otherwise unremarkable heart exam. - No signs of CHF in clinic, so patient advised to follow-up with cardiology and PCP -Echo in 2020 shows EF 60 to 65%  3. Right sided sciatica -Acute on chronic, unchanged - Likely flare of right-sided sciatica based on HPI, physical exam - Continue Tylenol and heating pad - May advance activity as tolerated - Advised to use a 4 pronged walker for all transfers and ambulation    All pertinent previous records reviewed prior to and during visit.    Follow Up: If no improvement or worsening of symptoms   Subjective:    Chief Complaint: Right-sided low back and sciatica pain  HPI:   06/11/21 Patient presents for evaluation of acute right lower back radiating to right leg pain.  She states that this is similar to her sciatica presentations in the past.  Pain started on Saturday, 3 days ago.  Patient mopped around her house on the Thursday and Friday prior to pain starting and thinks she may have overdone it.  Denies numbness/tingling/weakness into feet.  Denies urinary incontinence, saddle paresthesia, legs giving out from under her.  She resents today because she is worried that her discomfort may lead to a fall.  Currently using 4 pronged walker for ambulation around the house and when leaving house.  Uses Tylenol as needed for pain control.  Is hesitant to use gabapentin due to side effects.  Does not take NSAIDs due to age and chronic conditions.    Relevant Historical Information: Additional concern of weight gain.  Patient has gained 5 to 6 pounds  over the past 2 weeks without trying.  History of chronic A. fib and CAD.  Last echo in 2020 with EF 60-65%.  Denies chest pain, shortness of breath.  Endorses mild swelling in lower extremities  Additional pertinent review of systems negative.   Current Outpatient Medications:    acetaminophen (TYLENOL) 325 MG tablet, Take 1-2 tablets (325-650 mg total) by mouth every 6 (six) hours as needed for mild pain (pain score 1-3 or temp > 100.5)., Disp:  , Rfl:    acidophilus (RISAQUAD) CAPS capsule, Take 1 capsule by mouth daily., Disp:  , Rfl:    acyclovir ointment (ZOVIRAX) 5 %, Apply 1 application topically 2 (two) times daily as needed., Disp: 5 g, Rfl: 0   B Complex-C (B-COMPLEX WITH VITAMIN C) tablet, Take 1 tablet by mouth daily., Disp:  , Rfl:    diltiazem (CARDIZEM SR) 120 MG 12 hr capsule, Take 1 capsule (120 mg total) by mouth 2 (two) times daily., Disp: 180 capsule, Rfl: 3   furosemide (LASIX) 20 MG tablet, TAKE 1 TABLET BY MOUTH EVERY OTHER DAY, Disp: 45 tablet, Rfl: 3   levothyroxine (SYNTHROID) 75 MCG tablet, TAKE 1 TABLET BY MOUTH DAILY BEFORE BREAKFAST., Disp: 90 tablet, Rfl: 1   polyethylene glycol (MIRALAX) 17 g packet, Take 17 g by mouth daily. Hold for loose bowel movements, Disp: 60 each, Rfl: 0   potassium chloride (KLOR-CON) 10 MEQ tablet, Take 1 tablet (10 mEq total) by mouth every other day., Disp:   45 tablet, Rfl: 3   pravastatin (PRAVACHOL) 10 MG tablet, Take 1 tablet (10 mg total) by mouth daily., Disp: 90 tablet, Rfl: 3   Rivaroxaban (XARELTO) 15 MG TABS tablet, Take 1 tablet (15 mg total) by mouth daily with supper., Disp: 90 tablet, Rfl: 3   Vitamin D, Ergocalciferol, (DRISDOL) 1.25 MG (50000 UNIT) CAPS capsule, Take 1 capsule (50,000 Units total) by mouth every 7 (seven) days. (Patient taking differently: Take 50,000 Units by mouth every 7 (seven) days. Thursday), Disp: 8 capsule, Rfl: 0  Current Facility-Administered Medications:    cyanocobalamin ((VITAMIN B-12))  injection 1,000 mcg, 1,000 mcg, Intramuscular, Q30 days, Burns, Stacy J, MD, 1,000 mcg at 06/03/21 1405   Objective:     Vitals:   06/11/21 0935  BP: 122/60  Pulse: 80  SpO2: 97%  Weight: 142 lb 12.8 oz (64.8 kg)  Height: 5\' 3"  (1.6 m)      Body mass index is 25.3 kg/m.    Physical Exam:    Gen: Appears well, nad, nontoxic and pleasant Cardio: Irregularly irregular rhythm with holosystolic murmur 3/6.  Bilateral trace pitting edema in lower extremities Lung: Clear to auscultation in all fields.  Breathing unlabored Psych: Alert and oriented, appropriate mood and affect Neuro: sensation intact, strength is 5/5 in upper and lower extremities, muscle tone wnl Skin: no susupicious lesions or rashes  Back - Normal skin, Spine with normal alignment and no deformity.   No tenderness to vertebral process palpation.   Paraspinous muscles are tender bilaterally and without spasm TTP right gluteal musculature Straight leg raise negative Trendelenberg negative    Electronically signed by:  D.[facility] 10:58 AM 06/11/21

## 2021-06-12 NOTE — Telephone Encounter (Signed)
Patient's daughter is returning call, requesting to go over recommendations.

## 2021-06-12 NOTE — Progress Notes (Signed)
Left detailed message as noted for patient (DPR) and to call back

## 2021-06-13 ENCOUNTER — Encounter: Payer: Self-pay | Admitting: Nurse Practitioner

## 2021-06-13 ENCOUNTER — Other Ambulatory Visit: Payer: Self-pay

## 2021-06-13 ENCOUNTER — Telehealth: Payer: Self-pay | Admitting: *Deleted

## 2021-06-13 ENCOUNTER — Ambulatory Visit (HOSPITAL_COMMUNITY)
Admission: RE | Admit: 2021-06-13 | Discharge: 2021-06-13 | Disposition: A | Discharge status: Home / Self Care | Payer: Medicare Other | Source: Ambulatory Visit | Attending: Nurse Practitioner | Admitting: Nurse Practitioner

## 2021-06-13 ENCOUNTER — Ambulatory Visit: Payer: Medicare Other | Admitting: Nurse Practitioner

## 2021-06-13 VITALS — BP 120/72 | HR 93 | Temp 97.8°F | Ht 63.0 in | Wt 138.6 lb

## 2021-06-13 DIAGNOSIS — M25561 Pain in right knee: Secondary | ICD-10-CM | POA: Insufficient documentation

## 2021-06-13 LAB — COMPREHENSIVE METABOLIC PANEL
ALT: 29 U/L (ref 0–35)
AST: 31 U/L (ref 0–37)
Albumin: 4.1 g/dL (ref 3.5–5.2)
Alkaline Phosphatase: 55 U/L (ref 39–117)
BUN: 10 mg/dL (ref 6–23)
CO2: 32 mEq/L (ref 19–32)
Calcium: 9.8 mg/dL (ref 8.4–10.5)
Chloride: 100 mEq/L (ref 96–112)
Creatinine, Ser: 0.66 mg/dL (ref 0.40–1.20)
GFR: 75.36 mL/min (ref >60.00)
Glucose, Bld: 87 mg/dL (ref 70–99)
Potassium: 4.2 mEq/L (ref 3.5–5.1)
Sodium: 136 mEq/L (ref 135–145)
Total Bilirubin: 0.7 mg/dL (ref 0.2–1.2)
Total Protein: 7.5 g/dL (ref 6.0–8.3)

## 2021-06-13 LAB — CBC WITH DIFFERENTIAL/PLATELET
Basophils Absolute: 0 10*3/uL (ref 0.0–0.1)
Basophils Relative: 0.2 % (ref 0.0–3.0)
Eosinophils Absolute: 0 10*3/uL (ref 0.0–0.7)
Eosinophils Relative: 0.7 % (ref 0.0–5.0)
HCT: 34.7 % — ABNORMAL LOW (ref 36.0–46.0)
Hemoglobin: 11.6 g/dL — ABNORMAL LOW (ref 12.0–15.0)
Lymphocytes Relative: 28 % (ref 12.0–46.0)
Lymphs Abs: 1.2 10*3/uL (ref 0.7–4.0)
MCHC: 33.5 g/dL (ref 30.0–36.0)
MCV: 97.3 fl (ref 78.0–100.0)
Monocytes Absolute: 0.4 10*3/uL (ref 0.1–1.0)
Monocytes Relative: 9.6 % (ref 3.0–12.0)
Neutro Abs: 2.6 10*3/uL (ref 1.4–7.7)
Neutrophils Relative %: 61.5 % (ref 43.0–77.0)
Platelets: 136 10*3/uL — ABNORMAL LOW (ref 150.0–400.0)
RBC: 3.56 Mil/uL — ABNORMAL LOW (ref 3.87–5.11)
RDW: 13.3 % (ref 11.5–15.5)
WBC: 4.2 10*3/uL (ref 4.0–10.5)

## 2021-06-13 LAB — URIC ACID: Uric Acid, Serum: 5.2 mg/dL (ref 2.4–7.0)

## 2021-06-13 NOTE — Telephone Encounter (Signed)
Negative for DVT...Grace Alvarado

## 2021-06-13 NOTE — Patient Instructions (Signed)
Take tylenol 650mg  by mouth every 6 hours as needed  Please call the office if your pain worsens, if you experience worsening swelling, if you see redness to the knee, etc.

## 2021-06-13 NOTE — Telephone Encounter (Signed)
Notified pt/daughter Grace Alvarado) w/ Maralyn Sago response...Raechel Chute

## 2021-06-13 NOTE — Progress Notes (Signed)
Subjective:  Patient ID: Grace Alvarado, female    DOB: [birth year]  Age: 87 y.o. MRN: [id number]  CC:  Chief Complaint  Patient presents with   Knee Pain    (R) KNEE.Marland Kitchen Pt states she saw Sport med on Monday they thought it was her sciatica, but pt states pain is down in her knee      HPI  This patient arrives today for the above.  She is accompanied today with her daughter.  She tells me that she is been experiencing right knee pain since yesterday.  She tells me that last night the pain was "severe" and was affecting her ability to sleep.  She has a difficult time describing the pain but tells me its not sharp or dull.  Tells me the pain was constant all night and she has been taking Tylenol as needed.  She tells me that does seem to help the pain slightly.  She was seen by sports medicine earlier this week about 3 days ago at that time they felt that her back and buttock pain was related to her sciatica.  The patient is now concerned because the pain seems to have moved down to her right knee and she did find a prominent vein on the lateral aspect of her right knee and she is very concerned she may have a blood clot.  Her daughter tells me that the patient was mopping the floors prior to her back pain and lower extremity flareup, they feel this may have triggered the pain.  She is on Xarelto.  Past Medical History:  Diagnosis Date   Atrial fibrillation (HCC)    Hyperlipidemia    Hypertension    Hypothyroidism    Mitral regurgitation    MVP (mitral valve prolapse)       Family History  Problem Relation Age of Onset   Cancer Mother    Dementia Mother     Social History   Social History Narrative   Not on file   Social History   Tobacco Use   Smoking status: Former   Smokeless tobacco: Never  Substance Use Topics   Alcohol use: Not Currently     Current Meds  Medication Sig   acetaminophen (TYLENOL) 325 MG tablet Take 1-2 tablets (325-650 mg total) by mouth  every 6 (six) hours as needed for mild pain (pain score 1-3 or temp > 100.5).   acidophilus (RISAQUAD) CAPS capsule Take 1 capsule by mouth daily.   acyclovir ointment (ZOVIRAX) 5 % Apply 1 application topically 2 (two) times daily as needed.   B Complex-C (B-COMPLEX WITH VITAMIN C) tablet Take 1 tablet by mouth daily.   diltiazem (CARDIZEM SR) 120 MG 12 hr capsule Take 1 capsule (120 mg total) by mouth 2 (two) times daily.   furosemide (LASIX) 20 MG tablet TAKE 1 TABLET BY MOUTH EVERY OTHER DAY   levothyroxine (SYNTHROID) 75 MCG tablet TAKE 1 TABLET BY MOUTH DAILY BEFORE BREAKFAST.   polyethylene glycol (MIRALAX) 17 g packet Take 17 g by mouth daily. Hold for loose bowel movements   potassium chloride (KLOR-CON) 10 MEQ tablet Take 1 tablet (10 mEq total) by mouth every other day.   pravastatin (PRAVACHOL) 10 MG tablet Take 1 tablet (10 mg total) by mouth daily.   Rivaroxaban (XARELTO) 15 MG TABS tablet Take 1 tablet (15 mg total) by mouth daily with supper.    ROS:  Review of Systems  Constitutional:  Negative for fever.  Musculoskeletal:  Positive for back pain and joint pain.  Neurological:  Positive for sensory change.    Objective:   Today's Vitals: BP 120/72 (BP Location: Left Arm)   Pulse 93   Temp 97.8 F (36.6 C) (Oral)   Ht 5\' 3"  (1.6 m)   Wt 138 lb 9.6 oz (62.9 kg)   LMP  (LMP Unknown)   SpO2 97%   BMI 24.55 kg/m  Vitals with BMI 06/13/2021 06/11/2021 05/27/2021  Height 5\' 3"  5\' 3"  5\' 3"   Weight 138 lbs 10 oz 142 lbs 13 oz 137 lbs  BMI 24.56 25.3 24.27  Systolic 120 122 05/29/2021  Diastolic 72 60 78  Pulse 93 80 93     Physical Exam Vitals reviewed.  Constitutional:      General: She is not in acute distress.    Appearance: Normal appearance.  HENT:     Head: Normocephalic and atraumatic.  Neck:     Vascular: No carotid bruit.  Cardiovascular:     Rate and Rhythm: Normal rate. Rhythm regularly irregular.     Pulses: Normal pulses.     Heart sounds: Normal heart  sounds.  Pulmonary:     Effort: Pulmonary effort is normal.     Breath sounds: Normal breath sounds.  Musculoskeletal:     Lumbar back: Positive right straight leg raise test.     Right knee: No swelling, effusion, erythema or crepitus. Normal range of motion. Tenderness (lateral appears to be soft tissue pain) present. Normal alignment.     Left knee: No swelling, effusion, erythema or crepitus. Normal range of motion. No tenderness. Normal alignment.       Legs:     Comments: Pain to calf and back with extension of right knee and foot dorsiflexion  Red line indicates presence of vericose vein  Skin:    General: Skin is warm and dry.  Neurological:     General: No focal deficit present.     Mental Status: She is alert and oriented to person, place, and time.  Psychiatric:        Mood and Affect: Mood normal.        Behavior: Behavior normal.        Judgment: Judgment normal.         Assessment and Plan   1. Acute pain of right knee      Plan: 1.  Most likely related to her sciatica.  Because patient is concerned about possible DVT we will order stat ultrasound to rule this out.  Based on clinical exam and the fact that she is on Xarelto I think it is very low risk that she has a blood clot however she did have calf pain when she dorsiflexed her foot and she does have varicose vein present to the area of tenderness.  No significant swelling, redness, heat to touch, but she did have tenderness on palpation.  We will order CBC, CMP, and uric acid as well.  Other differential diagnoses include early soft tissue infection and/or gout flareup.  Patient was told that if all of her tests come back normal that she needs to notify the office if her pain worsens, she is experiencing worsening redness, if she is experiencing worsening swelling, etc.  She tells me she understands.  She is scheduled for a physical with her primary care provider later this month and she is encouraged to  continue to follow-up as scheduled.  For pain management in the meantime I recommended she use Tylenol  as needed as this seems to be helping her pain.  She tells me she understands.  Further recommendations may be made based upon ultrasound and lab work results.   Tests ordered Orders Placed This Encounter  Procedures   CBC with Differential/Platelets   Uric Acid   CMP   VAS [place] LOWER EXTREMITY VENOUS (DVT)      No orders of the defined types were placed in this encounter.   Patient to follow-up as schedule later this month for physical exam or sooner as needed. I spent 37 minutes dedicated to the care of this patient on the date of this encounter which includes a combination of either face-to-face or virtual contact with the patient, review of  charts and previous labs, and ordering of tests and/or procedures.   Elenore Paddy, NP

## 2021-06-17 ENCOUNTER — Encounter: Payer: Self-pay | Admitting: Sports Medicine

## 2021-06-17 NOTE — Telephone Encounter (Signed)
Spoke w/ pt's daughter who reports Grace Alvarado will be re-evaluated tomorrow at her visit w/ Dr. Katrinka Blazing.

## 2021-06-17 NOTE — Progress Notes (Signed)
[name] [address] [place] [address] Phone: [phone] Subjective:   Bruce Donath, am serving as a scribe for Dr. Antoine Primas.  This visit occurred during the SARS-CoV-2 public health emergency.  Safety protocols were in place, including screening questions prior to the visit, additional usage of staff PPE, and extensive cleaning of exam room while observing appropriate contact time as indicated for disinfecting solutions.   I'm seeing this patient by the request  of:  Pincus Sanes, MD  CC: low back pain   [id number]  06/11/2021 saw Dr. Jean Rosenthal 3. Right sided sciatica -Acute on chronic, unchanged - Likely flare of right-sided sciatica based on HPI, physical exam - Continue Tylenol and heating pad - May advance activity as tolerated - Advised to use a 4 pronged walker for all transfers and ambulation  Updated 06/18/2021 Tyja Gortney is a 87 y.o. female coming in with complaint of back pain. Patient states that she has been having pain down R leg. Patient states that her pain has increased over past week. Unable to sleep due to pain radiating from hip down to the foot. Seen other providers as well.  Had ultrasound neg DVT even though the patient is on a blood thinner.        Past Medical History:  Diagnosis Date   Atrial fibrillation (HCC)    Hyperlipidemia    Hypertension    Hypothyroidism    Mitral regurgitation    MVP (mitral valve prolapse)    Past Surgical History:  Procedure Laterality Date   APPENDECTOMY     c section     CESAREAN SECTION     CHOLECYSTECTOMY     COLONOSCOPY WITH PROPOFOL N/A 11/09/2018   Procedure: COLONOSCOPY WITH PROPOFOL;  Surgeon: Sherrilyn Rist, MD;  Location: [facility] ENDOSCOPY;  Service: Gastroenterology;  Laterality: N/A;   ESOPHAGOGASTRODUODENOSCOPY (EGD) WITH PROPOFOL N/A 11/09/2018   Procedure: ESOPHAGOGASTRODUODENOSCOPY (EGD) WITH PROPOFOL;  Surgeon: Sherrilyn Rist, MD;  Location: [facility]  ENDOSCOPY;  Service: Gastroenterology;  Laterality: N/A;   HOT HEMOSTASIS N/A 11/09/2018   Procedure: HOT HEMOSTASIS (ARGON PLASMA COAGULATION/BICAP);  Surgeon: Sherrilyn Rist, MD;  Location: [facility] ENDOSCOPY;  Service: Gastroenterology;  Laterality: N/A;   I & D EXTREMITY Left 03/02/2020   Procedure: evacuation of left knee hematoma, possible wound vac placement;  Surgeon: Cammy Copa, MD;  Location: [facility] OR;  Service: Orthopedics;  Laterality: Left;   SUBMUCOSAL INJECTION  11/09/2018   Procedure: SUBMUCOSAL INJECTION;  Surgeon: Sherrilyn Rist, MD;  Location: [facility] ENDOSCOPY;  Service: Gastroenterology;;   Social History   Socioeconomic History   Marital status: Widowed    Spouse name: Not on file   Number of children: 3   Years of education: Not on file   Highest education level: Not on file  Occupational History   Occupation: retired  Tobacco Use   Smoking status: Former   Smokeless tobacco: Never  Building services engineer Use: Never used  Substance and Sexual Activity   Alcohol use: Not Currently   Drug use: Never   Sexual activity: Not Currently  Other Topics Concern   Not on file  Social History Narrative   Not on file   Social Determinants of Health   Financial Resource Strain: Not on file  Food Insecurity: Not on file  Transportation Needs: Not on file  Physical Activity: Not on file  Stress: Not on file  Social Connections: Not on  file   Allergies  Allergen Reactions   Codeine Other (See Comments)    unknown   Penicillins Other (See Comments)    unknown   Vancomycin Other (See Comments)    unknown   Zithromax [Azithromycin] Other (See Comments)    unknown   Latex Rash   Family History  Problem Relation Age of Onset   Cancer Mother    Dementia Mother     Current Outpatient Medications (Endocrine & Metabolic):    levothyroxine (SYNTHROID) 75 MCG tablet, TAKE 1 TABLET BY MOUTH DAILY BEFORE BREAKFAST.  Current Outpatient Medications (Cardiovascular):     diltiazem (CARDIZEM SR) 120 MG 12 hr capsule, Take 1 capsule (120 mg total) by mouth 2 (two) times daily.   furosemide (LASIX) 20 MG tablet, TAKE 1 TABLET BY MOUTH EVERY OTHER DAY   pravastatin (PRAVACHOL) 10 MG tablet, Take 1 tablet (10 mg total) by mouth daily.   Current Outpatient Medications (Analgesics):    acetaminophen (TYLENOL) 325 MG tablet, Take 1-2 tablets (325-650 mg total) by mouth every 6 (six) hours as needed for mild pain (pain score 1-3 or temp > 100.5).  Current Outpatient Medications (Hematological):    Rivaroxaban (XARELTO) 15 MG TABS tablet, Take 1 tablet (15 mg total) by mouth daily with supper.  Current Outpatient Medications (Other):    acidophilus (RISAQUAD) CAPS capsule, Take 1 capsule by mouth daily.   acyclovir (ZOVIRAX) 400 MG tablet, Take 1 tablet (400 mg total) by mouth 3 (three) times daily.   acyclovir ointment (ZOVIRAX) 5 %, Apply 1 application topically 2 (two) times daily as needed.   B Complex-C (B-COMPLEX WITH VITAMIN C) tablet, Take 1 tablet by mouth daily.   doxycycline (VIBRA-TABS) 100 MG tablet, Take 1 tablet (100 mg total) by mouth 2 (two) times daily for 7 days.   polyethylene glycol (MIRALAX) 17 g packet, Take 17 g by mouth daily. Hold for loose bowel movements   potassium chloride (KLOR-CON) 10 MEQ tablet, Take 1 tablet (10 mEq total) by mouth every other day.   Reviewed prior external information including notes and imaging from  primary care provider this also includes other providers recently.  This includes most recent work-up with laboratory work-up that only showed patient's mild anemia but seems to be at patient's baseline As well as notes that were available from care everywhere and other healthcare systems.  Past medical history, social, surgical and family history all reviewed in electronic medical record.  No pertanent information unless stated regarding to the chief complaint.   Review of Systems:  No headache, visual changes,  nausea, vomiting, diarrhea, constipation, dizziness, abdominal pain, skin rash, fevers, chills, night sweats, weight loss, swollen lymph nodes, body aches, joint swelling, chest pain, shortness of breath, mood changes. POSITIVE muscle aches, body aches, joint swelling  Objective  Blood pressure 112/72, pulse (!) 120, height 5\' 3"  (1.6 m), weight 139 lb (63 kg), SpO2 99 %.   General: No apparent distress alert sitting comfortably in chair.  Patient does seem to be a little confused.  Patient is accompanied with daughter HEENT: Pupils equal, extraocular movements intact  Respiratory: Patient's speak in full sentences and does not appear short of breath  Cardiovascular: Trace lower extremity edema, non tender, no erythema  Gait severely antalgic using a rolling walker to aid in mobilization MSK: Patient's leg is out of proportion to the amount of palpation of the pain even to light sensation.  Patient does have some mild reddening of the skin that does  not seem to be cellulitic and questionable small vesicle noted.  No weeping.  Trace noted of the lower edema. Patient does have tightness with straight leg test.  No worsening of the radicular symptoms.  Tender to palpation though even in the hamstring as well as going up to the lower back.  Patient has limited range of motion secondary to guarding.   Impression and Recommendations:     The above documentation has been reviewed and is accurate and complete Judi Saa, DO

## 2021-06-18 ENCOUNTER — Other Ambulatory Visit: Payer: Self-pay

## 2021-06-18 ENCOUNTER — Ambulatory Visit (INDEPENDENT_AMBULATORY_CARE_PROVIDER_SITE_OTHER): Payer: Medicare Other

## 2021-06-18 ENCOUNTER — Other Ambulatory Visit: Payer: Self-pay | Admitting: Family Medicine

## 2021-06-18 ENCOUNTER — Ambulatory Visit: Payer: Medicare Other | Admitting: Family Medicine

## 2021-06-18 ENCOUNTER — Encounter: Payer: Self-pay | Admitting: Family Medicine

## 2021-06-18 VITALS — BP 112/72 | HR 120 | Ht 63.0 in | Wt 139.0 lb

## 2021-06-18 DIAGNOSIS — M79605 Pain in left leg: Secondary | ICD-10-CM

## 2021-06-18 DIAGNOSIS — M25561 Pain in right knee: Secondary | ICD-10-CM

## 2021-06-18 DIAGNOSIS — R609 Edema, unspecified: Secondary | ICD-10-CM | POA: Diagnosis not present

## 2021-06-18 DIAGNOSIS — M545 Low back pain, unspecified: Secondary | ICD-10-CM

## 2021-06-18 DIAGNOSIS — G8929 Other chronic pain: Secondary | ICD-10-CM

## 2021-06-18 DIAGNOSIS — M79604 Pain in right leg: Secondary | ICD-10-CM

## 2021-06-18 MED ORDER — DOXYCYCLINE HYCLATE 100 MG PO TABS: 100.0000 mg | ORAL_TABLET | Freq: Two times a day (BID) | ORAL | 0 refills | Status: DC

## 2021-06-18 MED ORDER — ACYCLOVIR 400 MG PO TABS: 400.0000 mg | ORAL_TABLET | Freq: Three times a day (TID) | ORAL | 0 refills | Status: DC

## 2021-06-18 NOTE — Patient Instructions (Addendum)
Good to see you  I think could be superficial phlebitis or shingles as well  Acyclovir 400mg  3 times a day for 5 days  Doxycycline 100mg  2 times a day for 1 week  Xrays today  Consider gabapentin 100mg  at night if need more relief  See me again in 2 weeks OK to double book

## 2021-06-18 NOTE — Progress Notes (Signed)
Called Pt and daughter they state that pt's weight today is 137 and "has been running 137-138 for the last couple days". She will call with wt gain. Pt daughter verbalized understanding.

## 2021-06-18 NOTE — Assessment & Plan Note (Signed)
Could be secondary to radicular symptoms, dependent edema, or potential shingles outbreak.  As above.  X-rays pending.

## 2021-06-18 NOTE — Assessment & Plan Note (Signed)
Patient does have dependent edema that could also be contributing to some of the pain.  Unfortunately patient has had difficulty with hyponatremia and do not feel that increasing the Lasix would be beneficial for this individual.  Follow-up with me again in 2 weeks.  Patient as well as daughter knows if worsening pain to seek medical attention.  Could be potentially superficial phlebitis and will treat with a low-dose of doxycycline.

## 2021-06-18 NOTE — Assessment & Plan Note (Signed)
Low back pain. Discussed HEP  Discussed which activities to do  Patient and daughter declined gabapentin  Concerned with mild rash on the leg if this could be potentially shingles.  Started on acyclovir.  In addition to this it would not be consistent because patient's pain seems to be out of proportion.  We will get lumbar x-rays as well to further evaluate for any type of compression fracture again.  Do not know if you would make any significant improvement or not at the moment.  We will get x-rays of the lower leg.  We will treat for potential superficial phlebitis as well with patient having some pain over the varicose veins.  Patient as well as daughter knows if worsening pain to seek medical attention.  Patient will follow up with me though again in 2 weeks otherwise.

## 2021-06-19 ENCOUNTER — Encounter: Payer: Self-pay | Admitting: Family Medicine

## 2021-06-20 ENCOUNTER — Encounter: Payer: Self-pay | Admitting: Family Medicine

## 2021-06-20 MED ORDER — GABAPENTIN 100 MG PO CAPS: 100.0000 mg | ORAL_CAPSULE | Freq: Every day | ORAL | 1 refills | Status: DC

## 2021-06-24 NOTE — Progress Notes (Signed)
Subjective:    Patient ID: Grace Alvarado, female    DOB: [birth year], 87 y.o.   MRN: [id number]  This visit occurred during the SARS-CoV-2 public health emergency.  Safety protocols were in place, including screening questions prior to the visit, additional usage of staff PPE, and extensive cleaning of exam room while observing appropriate contact time as indicated for disinfecting solutions.     HPI The patient is here for follow up of their chronic medical problems, including CAD, htn, hichol, hypothyroid, pilonidal cyst, hyperglycemia, leg edema, B12 def  He has right leg and lower back pain that started over 2 weeks ago.  She denies any injury or event that caused it.  She states the pain just started.  Her daughter states she initially had lower back pain and buttock pain.  At some point the pain in her right lateral lower leg developed and goes down toward her ankle.  She denies any numbness, tingling or weakness in the leg.  She denies any burning sensation or muscle cramping.  The pain seems to be better with sitting and worse with laying down.  It tends to be worse in the late evening, overnight and first thing in the morning, but improves during the day.  She has seen sports medicine twice and our nurse practitioner for this.  She was placed on gabapentin 100 mg at night and that has helped.  He was thought that some of her pain could be from superficial phlebitis and shingles and she did complete a course of doxycycline and acyclovir without any improvement in her symptoms  She is taking Tylenol, using a lidocaine patch on her back and applying icy hot.    Always sleeps on right side.        Medications and allergies reviewed with patient and updated if appropriate.  Patient Active Problem List   Diagnosis Date Noted   Rib pain on left side 09/27/2020   LUQ pain 09/17/2020   Pilonidal cyst 09/17/2020   Allergic rhinitis 07/03/2020   Pressure injury of sacral region,  stage 1 07/03/2020   Aortic atherosclerosis (HCC) 06/13/2020   Hyponatremia 03/07/2020   DNR (do not resuscitate) 03/07/2020   Prepatellar bursitis, left knee 03/02/2020   Neuropathy 01/24/2020   Chronic back pain 01/24/2020   Complaints of leg weakness 01/24/2020   Compression fracture of T11 vertebra (HCC) 01/23/2020   Leg pain 01/03/2020   Chronic anticoagulation 01/03/2020   Compression fracture of L1 lumbar vertebra (HCC) 07/27/2019   Low back pain 07/20/2019   Hyperglycemia 05/01/2019   Constipation 11/16/2018   Hypokalemia    AVM (arteriovenous malformation) of colon    Lower GI bleed 11/06/2018   Iron deficiency anemia 11/02/2018   Lymphedema of left lower extremity 10/22/2018   B12 deficiency 05/27/2018   Dependent edema 05/25/2018   Pulmonary hypertension, primary (HCC) 05/20/2018   Dyslipidemia 05/20/2018   CHF (congestive heart failure) (HCC) 05/20/2018   Tricuspid regurgitation 05/17/2018   Mitral regurgitation 05/17/2018   Chronic a-fib 05/17/2018   Essential hypertension 05/17/2018   Hypothyroid 05/17/2018   CAD (coronary artery disease) 05/17/2018   Carotid atherosclerosis, bilateral 05/17/2018    Current Outpatient Medications on File Prior to Visit  Medication Sig Dispense Refill   acetaminophen (TYLENOL) 325 MG tablet Take 1-2 tablets (325-650 mg total) by mouth every 6 (six) hours as needed for mild pain (pain score 1-3 or temp > 100.5).     acidophilus (RISAQUAD) CAPS capsule Take 1  capsule by mouth daily.     acyclovir ointment (ZOVIRAX) 5 % Apply 1 application topically 2 (two) times daily as needed. 5 g 0   B Complex-C (B-COMPLEX WITH VITAMIN C) tablet Take 1 tablet by mouth daily.     diltiazem (CARDIZEM SR) 120 MG 12 hr capsule Take 1 capsule (120 mg total) by mouth 2 (two) times daily. 180 capsule 3   furosemide (LASIX) 20 MG tablet TAKE 1 TABLET BY MOUTH EVERY OTHER DAY 45 tablet 3   gabapentin (NEURONTIN) 100 MG capsule Take 1 capsule (100 mg  total) by mouth at bedtime. 14 capsule 1   levothyroxine (SYNTHROID) 75 MCG tablet TAKE 1 TABLET BY MOUTH DAILY BEFORE BREAKFAST. 90 tablet 1   polyethylene glycol (MIRALAX) 17 g packet Take 17 g by mouth daily. Hold for loose bowel movements 60 each 0   potassium chloride (KLOR-CON) 10 MEQ tablet Take 1 tablet (10 mEq total) by mouth every other day. 45 tablet 3   pravastatin (PRAVACHOL) 10 MG tablet Take 1 tablet (10 mg total) by mouth daily. 90 tablet 3   Rivaroxaban (XARELTO) 15 MG TABS tablet Take 1 tablet (15 mg total) by mouth daily with supper. 90 tablet 3   No current facility-administered medications on file prior to visit.    Past Medical History:  Diagnosis Date   Atrial fibrillation (HCC)    Hyperlipidemia    Hypertension    Hypothyroidism    Mitral regurgitation    MVP (mitral valve prolapse)     Past Surgical History:  Procedure Laterality Date   APPENDECTOMY     c section     CESAREAN SECTION     CHOLECYSTECTOMY     COLONOSCOPY WITH PROPOFOL N/A 11/09/2018   Procedure: COLONOSCOPY WITH PROPOFOL;  Surgeon: Sherrilyn Rist, MD;  Location: [facility] ENDOSCOPY;  Service: Gastroenterology;  Laterality: N/A;   ESOPHAGOGASTRODUODENOSCOPY (EGD) WITH PROPOFOL N/A 11/09/2018   Procedure: ESOPHAGOGASTRODUODENOSCOPY (EGD) WITH PROPOFOL;  Surgeon: Sherrilyn Rist, MD;  Location: [facility] ENDOSCOPY;  Service: Gastroenterology;  Laterality: N/A;   HOT HEMOSTASIS N/A 11/09/2018   Procedure: HOT HEMOSTASIS (ARGON PLASMA COAGULATION/BICAP);  Surgeon: Sherrilyn Rist, MD;  Location: [facility] ENDOSCOPY;  Service: Gastroenterology;  Laterality: N/A;   I & D EXTREMITY Left 03/02/2020   Procedure: evacuation of left knee hematoma, possible wound vac placement;  Surgeon: Cammy Copa, MD;  Location: [facility] OR;  Service: Orthopedics;  Laterality: Left;   SUBMUCOSAL INJECTION  11/09/2018   Procedure: SUBMUCOSAL INJECTION;  Surgeon: Sherrilyn Rist, MD;  Location: [facility] ENDOSCOPY;  Service:  Gastroenterology;;    Social History   Socioeconomic History   Marital status: Widowed    Spouse name: Not on file   Number of children: 3   Years of education: Not on file   Highest education level: Not on file  Occupational History   Occupation: retired  Tobacco Use   Smoking status: Former   Smokeless tobacco: Never  Building services engineer Use: Never used  Substance and Sexual Activity   Alcohol use: Not Currently   Drug use: Never   Sexual activity: Not Currently  Other Topics Concern   Not on file  Social History Narrative   Not on file   Social Determinants of Health   Financial Resource Strain: Not on file  Food Insecurity: Not on file  Transportation Needs: Not on file  Physical Activity: Not on file  Stress: Not on file  Social Connections:  Not on file    Family History  Problem Relation Age of Onset   Cancer Mother    Dementia Mother     Review of Systems  Constitutional:  Negative for fever.  Respiratory:  Negative for cough, shortness of breath and wheezing.   Cardiovascular:  Negative for chest pain, palpitations and leg swelling.  Musculoskeletal:  Positive for back pain.  Neurological:  Negative for light-headedness, numbness and headaches.      Objective:   Vitals:   06/25/21 1110  BP: 116/80  Pulse: 89  Temp: 98.2 F (36.8 C)  SpO2: 98%   BP Readings from Last 3 Encounters:  06/25/21 116/80  06/18/21 112/72  06/13/21 120/72   Wt Readings from Last 3 Encounters:  06/25/21 137 lb (62.1 kg)  06/18/21 139 lb (63 kg)  06/13/21 138 lb 9.6 oz (62.9 kg)   Body mass index is 24.27 kg/m.   Physical Exam    Constitutional: Appears well-developed and well-nourished. No distress.  HENT:  Head: Normocephalic and atraumatic.  Neck: Neck supple. No tracheal deviation present. No thyromegaly present.  No cervical lymphadenopathy Cardiovascular: Normal rate, regular rhythm and normal heart sounds.   2/6 murmur heard. No carotid bruit .  No  edema Pulmonary/Chest: Effort normal and breath sounds normal. No respiratory distress. No has no wheezes. No rales. Musculoskeletal: Tenderness with palpation right lower back just lateral to lumbar spine, tenderness with palpation lateral proximal lower leg Skin: Skin is warm and dry. Not diaphoretic.  No rash or skin changes Psychiatric: Normal mood and affect. Behavior is normal.      Assessment & Plan:    See Problem List for Assessment and Plan of chronic medical problems.

## 2021-06-25 ENCOUNTER — Other Ambulatory Visit: Payer: Self-pay

## 2021-06-25 ENCOUNTER — Encounter: Payer: Self-pay | Admitting: Internal Medicine

## 2021-06-25 ENCOUNTER — Ambulatory Visit: Payer: Medicare Other | Admitting: Internal Medicine

## 2021-06-25 VITALS — BP 116/80 | HR 89 | Temp 98.2°F | Ht 63.0 in | Wt 137.0 lb

## 2021-06-25 DIAGNOSIS — M5416 Radiculopathy, lumbar region: Secondary | ICD-10-CM

## 2021-06-25 DIAGNOSIS — I2510 Atherosclerotic heart disease of native coronary artery without angina pectoris: Secondary | ICD-10-CM

## 2021-06-25 DIAGNOSIS — R739 Hyperglycemia, unspecified: Secondary | ICD-10-CM

## 2021-06-25 DIAGNOSIS — I10 Essential (primary) hypertension: Secondary | ICD-10-CM

## 2021-06-25 DIAGNOSIS — L0591 Pilonidal cyst without abscess: Secondary | ICD-10-CM

## 2021-06-25 DIAGNOSIS — R609 Edema, unspecified: Secondary | ICD-10-CM

## 2021-06-25 DIAGNOSIS — E538 Deficiency of other specified B group vitamins: Secondary | ICD-10-CM | POA: Diagnosis not present

## 2021-06-25 DIAGNOSIS — E038 Other specified hypothyroidism: Secondary | ICD-10-CM

## 2021-06-25 DIAGNOSIS — I482 Chronic atrial fibrillation, unspecified: Secondary | ICD-10-CM

## 2021-06-25 DIAGNOSIS — Z23 Encounter for immunization: Secondary | ICD-10-CM | POA: Diagnosis not present

## 2021-06-25 DIAGNOSIS — E785 Hyperlipidemia, unspecified: Secondary | ICD-10-CM

## 2021-06-25 MED ORDER — CYANOCOBALAMIN 1000 MCG/ML IJ SOLN
1000.0000 ug | Freq: Once | INTRAMUSCULAR | Status: AC
  Administered 2021-06-25: 1000 ug via INTRAMUSCULAR

## 2021-06-25 NOTE — Assessment & Plan Note (Signed)
Chronic Lab Results  Component Value Date   HGBA1C 5.2 12/18/2020   Sugars have been in the normal range Will monitor A1c annually

## 2021-06-25 NOTE — Patient Instructions (Addendum)
FOR YOUR RIGHT LEG PAIN -  Your pain is likely a pinched nerve from your back   We can increase the gabapentin to 100 mg twice a day or 200 mg at night.     We can consider PT at home.     We can consider an MRI to look at the discs and nerves in the lower back.     We can consider having you see an orthopedic.     We can consider a low dose tramadol ( pain medication).     You can try salon pas patches on your back and leg.  Continue ice and heat.   You can take 3000 mg of tylenol a day maximum.  You can take 1000 mg three times a day.     You had a flu vaccine and B12 shot today.

## 2021-06-25 NOTE — Assessment & Plan Note (Signed)
Acute Lower back pain and right leg pain likely related to lumbar radiculopathy She has degenerative disc disease and likely has a nerve being pinched Unable to tolerate an MRI-cannot lay that long on her back Will try increasing gabapentin to 100 mg twice daily to see if that helps Continue ice, heat, using icy hot, Salonpas patches Discussed tramadol if her pain is not controlled Discussed possibly doing physical therapy at home Would like to avoid steroids given her osteoporosis

## 2021-06-25 NOTE — Assessment & Plan Note (Signed)
Chronic Asymptomatic-denies any palpitations, shortness of breath or chest pain Following with cardiology On Xarelto 15 mg daily, diltiazem 120 mg twice daily

## 2021-06-25 NOTE — Assessment & Plan Note (Signed)
Chronic Following with cardiology No symptoms consistent with angina New current medications

## 2021-06-25 NOTE — Assessment & Plan Note (Signed)
Chronic Blood pressure well controlled Continue diltiazem 120 mg twice daily 

## 2021-06-25 NOTE — Assessment & Plan Note (Signed)
Chronic Clinically euthyroid TSH 6 months ago within normal range Continue levothyroxine 75 mcg daily

## 2021-06-25 NOTE — Assessment & Plan Note (Signed)
Chronic Lipids have been well controlled Continue pravastatin 10 mg daily

## 2021-06-25 NOTE — Assessment & Plan Note (Signed)
Chronic Controlled Continue Lasix 20 mg every other day with Klor-Con

## 2021-06-25 NOTE — Assessment & Plan Note (Signed)
Chronic Has not absorb to oral B12 and will continue monthly B12 injections B12 injection today

## 2021-06-28 ENCOUNTER — Encounter: Payer: Self-pay | Admitting: Family Medicine

## 2021-06-28 ENCOUNTER — Other Ambulatory Visit: Payer: Self-pay

## 2021-06-28 ENCOUNTER — Encounter: Payer: Self-pay | Admitting: Internal Medicine

## 2021-06-28 MED ORDER — GABAPENTIN 100 MG PO CAPS: 100.0000 mg | ORAL_CAPSULE | Freq: Every day | ORAL | 1 refills | Status: DC

## 2021-06-29 MED ORDER — GABAPENTIN 100 MG PO CAPS: 200.0000 mg | ORAL_CAPSULE | Freq: Every day | ORAL | 1 refills | Status: DC

## 2021-07-02 ENCOUNTER — Ambulatory Visit: Payer: Medicare Other | Admitting: Family Medicine

## 2021-07-02 NOTE — Addendum Note (Signed)
Addended by: Pincus Sanes on: 07/02/2021 07:40 AM   Modules accepted: Orders

## 2021-07-03 ENCOUNTER — Encounter: Payer: Self-pay | Admitting: Internal Medicine

## 2021-07-07 ENCOUNTER — Other Ambulatory Visit: Payer: Self-pay | Admitting: Internal Medicine

## 2021-07-07 ENCOUNTER — Other Ambulatory Visit: Payer: Self-pay | Admitting: Cardiology

## 2021-07-09 ENCOUNTER — Encounter: Payer: Self-pay | Admitting: Internal Medicine

## 2021-07-12 ENCOUNTER — Encounter: Payer: Self-pay | Admitting: Internal Medicine

## 2021-07-16 ENCOUNTER — Encounter: Payer: Self-pay | Admitting: Internal Medicine

## 2021-07-16 ENCOUNTER — Ambulatory Visit: Payer: Medicare Other

## 2021-07-16 ENCOUNTER — Ambulatory Visit: Payer: Medicare Other | Admitting: Sports Medicine

## 2021-07-16 ENCOUNTER — Other Ambulatory Visit: Payer: Self-pay

## 2021-07-16 VITALS — BP 120/62 | HR 65 | Resp 99 | Ht 63.0 in | Wt 138.0 lb

## 2021-07-16 DIAGNOSIS — S46811A Strain of other muscles, fascia and tendons at shoulder and upper arm level, right arm, initial encounter: Secondary | ICD-10-CM

## 2021-07-16 DIAGNOSIS — M542 Cervicalgia: Secondary | ICD-10-CM | POA: Diagnosis not present

## 2021-07-16 NOTE — Progress Notes (Signed)
Aleen Sells D.[name] [address] [place] [address] Phone: [phone]   Assessment and Plan:     1. Neck pain 2. Strain of right trapezius muscle, initial encounter -Acute, initial sports medicine visit - Likely strain of right trapezius muscle, upper portion, based on HPI, physical exam - Recommended obtaining C-spine x-ray due to acute pain, patient age, however patient declines at today's visit - Start Tylenol 500 mg 2 tablets 3 times daily - Start heat over trapezius muscle - Start HEP for neck range of motion   Pertinent previous records reviewed include previous office note   Follow Up: As needed if no improvement or worsening symptoms.  Would continue to recommend x-ray C-spine if symptoms do not improve.   Subjective:   I, Debbe Odea, am serving as a scribe for Dr. Richardean Sale  Chief Complaint: Neck pain   HPI:   07/16/21 Patient is a 87 year old female presenting with neck pain that started this morning on the right side. Patient cannot turn her head without having pain.  Denies trauma, falls, numbness/tingling/weakness in extremities.  Radiates: no Numbness/tingling:no Weakness:no Aggravates:movement Treatments tried: ice, icy hot, thermacare, tylenol    Relevant Historical Information: CAD, hypertension, lower GI bleed  Additional pertinent review of systems negative.   Current Outpatient Medications:    acetaminophen (TYLENOL) 325 MG tablet, Take 1-2 tablets (325-650 mg total) by mouth every 6 (six) hours as needed for mild pain (pain score 1-3 or temp > 100.5)., Disp:  , Rfl:    acidophilus (RISAQUAD) CAPS capsule, Take 1 capsule by mouth daily., Disp:  , Rfl:    acyclovir ointment (ZOVIRAX) 5 %, Apply 1 application topically 2 (two) times daily as needed., Disp: 5 g, Rfl: 0   B Complex-C (B-COMPLEX WITH VITAMIN C) tablet, Take 1 tablet by mouth daily., Disp:  , Rfl:    diltiazem (CARDIZEM SR)  120 MG 12 hr capsule, Take 1 capsule (120 mg total) by mouth 2 (two) times daily., Disp: 180 capsule, Rfl: 3   furosemide (LASIX) 20 MG tablet, TAKE 1 TABLET BY MOUTH EVERY OTHER DAY, Disp: 45 tablet, Rfl: 3   gabapentin (NEURONTIN) 100 MG capsule, Take 2 capsules (200 mg total) by mouth at bedtime., Disp: 180 capsule, Rfl: 1   KLOR-CON M10 10 MEQ tablet, TAKE 1 TABLET (10 MEQ TOTAL) BY MOUTH EVERY OTHER DAY., Disp: 45 tablet, Rfl: 3   levothyroxine (SYNTHROID) 75 MCG tablet, TAKE 1 TABLET BY MOUTH EVERY DAY BEFORE BREAKFAST, Disp: 90 tablet, Rfl: 1   polyethylene glycol (MIRALAX) 17 g packet, Take 17 g by mouth daily. Hold for loose bowel movements, Disp: 60 each, Rfl: 0   pravastatin (PRAVACHOL) 10 MG tablet, Take 1 tablet (10 mg total) by mouth daily., Disp: 90 tablet, Rfl: 3   Rivaroxaban (XARELTO) 15 MG TABS tablet, Take 1 tablet (15 mg total) by mouth daily with supper., Disp: 90 tablet, Rfl: 3   Objective:     Vitals:   07/16/21 1326  BP: 120/62  Pulse: 65  Resp: (!) 99  Weight: 138 lb (62.6 kg)  Height: 5\' 3"  (1.6 m)      Body mass index is 24.45 kg/m.    Physical Exam:    Cervical Spine: Posture normal; No Tenderness to palpation  Neck ROM: full  Neurological:  Deep Tendon Reflexes:   Right  Left   Braachioradialis 2+ 2+  Bicep 2+  2+    Strength  Right  Left   Bicep 5/5  5/5  Tricep 5/5 5/5  Grip 5/5 5/5  Deltoid 5/5 5/5   Sensation: intact to light touch Skin: normal, intact Spurling's:  negative bilaterally ROM: Full active ROM TTP: Right trapezius primarily upper portion NTTP: cervical paraspinal, cervical spinous processes, thoracic paraspinal   Electronically signed by:  Aleen Sells D.[name] 1:52 PM 07/16/21

## 2021-07-16 NOTE — Patient Instructions (Addendum)
Good to see you  Tylenol 2 pills three times a day for a week  Gentle ROM exercise for neck given  Use heat  Trapezius muscle strain is what you have  See me again as needed

## 2021-07-25 ENCOUNTER — Other Ambulatory Visit: Payer: Self-pay | Admitting: Cardiology

## 2021-07-25 NOTE — Telephone Encounter (Signed)
Pt may qualify for a dose increase. Routing to pharmd pool for recommendations  Prescription refill request for Xarelto received.  Indication:afib Last office visit:cleaver 05/27/21 Weight:62.6kg Age:70f Scr:0.66 06/13/21 CrCl:51.5

## 2021-07-29 NOTE — Telephone Encounter (Signed)
Will refill 15mg  dose given pt's age and lower body weight. CrCl only over 50 because her SCr is 0.66 (0.69 and up makes her CrCl < 50)

## 2021-07-31 ENCOUNTER — Other Ambulatory Visit: Payer: Self-pay

## 2021-07-31 ENCOUNTER — Ambulatory Visit (INDEPENDENT_AMBULATORY_CARE_PROVIDER_SITE_OTHER): Payer: Medicare Other | Admitting: Physical Therapy

## 2021-07-31 ENCOUNTER — Encounter: Payer: Self-pay | Admitting: Physical Therapy

## 2021-07-31 DIAGNOSIS — M79604 Pain in right leg: Secondary | ICD-10-CM | POA: Diagnosis not present

## 2021-07-31 DIAGNOSIS — R2689 Other abnormalities of gait and mobility: Secondary | ICD-10-CM

## 2021-07-31 DIAGNOSIS — M6281 Muscle weakness (generalized): Secondary | ICD-10-CM | POA: Diagnosis not present

## 2021-07-31 NOTE — Therapy (Signed)
[facility] [address] [place], [place], [address] Phone: [phone]   Fax:  [phone]  Physical Therapy Evaluation  Patient Details  Name: Grace Alvarado MRN: [id number] Date of Birth: 1925/09/15 Referring Provider (PT): Cheryll Cockayne   Encounter Date: 07/31/2021   PT End of Session - 07/31/21 1957     Visit Number 1    Number of Visits 12    Date for PT Re-Evaluation 09/11/21    Authorization Type [employer]    PT Start Time 1303    PT Stop Time 1344    PT Time Calculation (min) 41 min    Activity Tolerance Patient limited by pain    Behavior During Therapy [facility] for tasks assessed/performed             Past Medical History:  Diagnosis Date   Atrial fibrillation (HCC)    Hyperlipidemia    Hypertension    Hypothyroidism    Mitral regurgitation    MVP (mitral valve prolapse)     Past Surgical History:  Procedure Laterality Date   APPENDECTOMY     c section     CESAREAN SECTION     CHOLECYSTECTOMY     COLONOSCOPY WITH PROPOFOL N/A 11/09/2018   Procedure: COLONOSCOPY WITH PROPOFOL;  Surgeon: Sherrilyn Rist, MD;  Location: [facility] ENDOSCOPY;  Service: Gastroenterology;  Laterality: N/A;   ESOPHAGOGASTRODUODENOSCOPY (EGD) WITH PROPOFOL N/A 11/09/2018   Procedure: ESOPHAGOGASTRODUODENOSCOPY (EGD) WITH PROPOFOL;  Surgeon: Sherrilyn Rist, MD;  Location: [facility] ENDOSCOPY;  Service: Gastroenterology;  Laterality: N/A;   HOT HEMOSTASIS N/A 11/09/2018   Procedure: HOT HEMOSTASIS (ARGON PLASMA COAGULATION/BICAP);  Surgeon: Sherrilyn Rist, MD;  Location: [facility] ENDOSCOPY;  Service: Gastroenterology;  Laterality: N/A;   I & D EXTREMITY Left 03/02/2020   Procedure: evacuation of left knee hematoma, possible wound vac placement;  Surgeon: Cammy Copa, MD;  Location: [facility] OR;  Service: Orthopedics;  Laterality: Left;   SUBMUCOSAL INJECTION  11/09/2018   Procedure: SUBMUCOSAL INJECTION;  Surgeon: Sherrilyn Rist, MD;  Location: [facility]  ENDOSCOPY;  Service: Gastroenterology;;    There were no vitals filed for this visit.    Subjective Assessment - 07/31/21 1309     Subjective Pt and daughter states pt has a "pinched nerve" in back. She had significant pain that started in September,. She states back pain and leg pain at that time. Back pain has improved. She does have a low level of chronic back pain at baseline.   Pain has improved some thinks gabapentin has helped, but pt was taking every day, daughter thought it was making her drowsy. She did "slide out of bed" one day, and required EMS to come to house to get her up. Today pt states pain in lateeral knee , states she thinks it is her Vein that is visible at this area.  She states pain down to foot at times. Pt also States decreased confidence with mobility since fall and would like to work on transfers strength , walking and balance too.    Pertinent History a-fib, osteoporosis,    Limitations Standing;Walking;House hold activities    Patient Stated Goals decreased pain, improved mobility, confidence with mobility and balance    Currently in Pain? Yes    Pain Score 6     Pain Location Leg    Pain Orientation Right    Pain Descriptors / Indicators Aching;Sore    Pain Type Acute pain    Pain Onset 1  to 4 weeks ago    Pain Frequency Intermittent    Aggravating Factors  standing, walking, activity                [facility] PT Assessment - 07/31/21 0001       Assessment   Medical Diagnosis Lumbar radiculopathy    Referring Provider (PT) Cheryll Cockayne    Prior Therapy for balance      Precautions   Precautions Fall      Balance Screen   Has the patient fallen in the past 6 months Yes    How many times? 1    Has the patient had a decrease in activity level because of a fear of falling?  Yes    Is the patient reluctant to leave their home because of a fear of falling?  No      Prior Function   Level of Independence Independent      Cognition   Overall  Cognitive Status Within Functional Limits for tasks assessed      ROM / Strength   AROM / PROM / Strength AROM;Strength      AROM   Overall AROM Comments hips: WNL, knee: [facility]      Strength   Overall Strength Comments hips: 4-/5, Knee: 4/5      Palpation   Palpation comment Pain at lateral knee, posterior knee      Special Tests   Other special tests unable to perform special test, due to pain with positioning. Pt uncomfortable laying on back and on side today. Increased pain with knee flexion,      Transfers   Comments able to do sit to stand independently, requires UE assist, slow, cautious movement      Ambulation/Gait   Gait Comments Able to ambulate with RW, increased tension in UTs.                        Objective measurements completed on examination: See above findings.       [facility] Adult PT Treatment/Exercise - 07/31/21 0001       Exercises   Exercises Lumbar      Lumbar Exercises: Stretches   Active Hamstring Stretch 3 reps;30 seconds;Right      Lumbar Exercises: Seated   Long Arc Quad on Chair 10 reps;Right                     PT Education - 07/31/21 1957     Education Details PT POC, exam findings, HEP,    Person(s) Educated Patient    Methods Explanation;Demonstration;Tactile cues;Verbal cues;Handout    Comprehension Verbalized understanding;Returned demonstration;Verbal cues required;Tactile cues required;Need further instruction              PT Short Term Goals - 07/31/21 2009       PT SHORT TERM GOAL #1   Title Pt to be independent with initial HEP    Time 2    Period Weeks    Status New    Target Date 08/14/21      PT SHORT TERM GOAL #2   Title Pt to report decreased pain in R leg to 0-4/10    Time 2    Period Weeks    Target Date 08/14/21               PT Long Term Goals - 07/31/21 2009       PT LONG TERM GOAL #1   Title Pt  to be independent with final HEP    Time 6    Period Weeks     Status New    Target Date 09/11/21      PT LONG TERM GOAL #2   Title Pt to report decreased pain in R LE to 0-2/10 with activity,    Time 6    Period Weeks    Status New    Target Date 09/11/21      PT LONG TERM GOAL #3   Title Pt to demo ability for sit to stand on 1st attempt with minimal UE support    Time 6    Period Weeks    Status New    Target Date 09/11/21      PT LONG TERM GOAL #4   Title gait and balance goal TBD                    Plan - 07/31/21 2028     Clinical Impression Statement Pt presents with primary complaint of increased pain in her R lower leg. She has significant pain today with attempts for assessment. She has most pain in lateral knee region, increased wtih knee flexion. She has difficulty laying supine or in sidelying for further assessment. Unclear if she did have more of a lumbar radiculopathy type pain initially, but it is hard to pinpoint pain source today. She reports no back pain today, other than her normal basline soreness. Pain does seem to be improving since she was seen at dr office initially. Pt with concerns for her mobility. She does have decrased mobility, transfers, and gait safety since i saw her last for a previous PT episode. She will benefit from PT for LE strength, transfers, gait, stairs, and balance. Discussed return to MD for leg pain if it does not improve for further assessment.    Personal Factors and Comorbidities Age    Examination-Activity Limitations Bed Mobility;Bend;Squat;Stairs;Stand;Lift;Locomotion Level;Transfers    Examination-Participation Restrictions Cleaning;Community Activity;Shop;Meal Prep    Stability/Clinical Decision Making Evolving/Moderate complexity    Clinical Decision Making Moderate    Rehab Potential Good    PT Frequency 2x / week    PT Duration 6 weeks    PT Treatment/Interventions ADLs/Self Care Home Management;Cryotherapy;Electrical Stimulation;Iontophoresis 4mg /ml Dexamethasone;Moist  Heat;Traction;Balance training;Therapeutic exercise;Therapeutic activities;Functional mobility training;Stair training;Gait training;DME Instruction;Neuromuscular re-education;Patient/family education;Orthotic Fit/Training;Manual techniques;Passive range of motion;Dry needling;Taping;Energy conservation;Spinal Manipulations;Joint Manipulations    PT Home Exercise Plan [email or web address]    Consulted and Agree with Plan of Care Patient             Patient will benefit from skilled therapeutic intervention in order to improve the following deficits and impairments:  Abnormal gait, Decreased endurance, Decreased activity tolerance, Pain, Decreased strength, Decreased balance, Decreased mobility, Decreased range of motion, Impaired flexibility  Visit Diagnosis: Pain in right leg  Other abnormalities of gait and mobility  Muscle weakness (generalized)     Problem List Patient Active Problem List   Diagnosis Date Noted   Lumbar radiculopathy 06/25/2021   Rib pain on left side 09/27/2020   LUQ pain 09/17/2020   Pilonidal cyst 09/17/2020   Allergic rhinitis 07/03/2020   Pressure injury of sacral region, stage 1 07/03/2020   Aortic atherosclerosis (HCC) 06/13/2020   Hyponatremia 03/07/2020   DNR (do not resuscitate) 03/07/2020   Prepatellar bursitis, left knee 03/02/2020   Neuropathy 01/24/2020   Chronic back pain 01/24/2020   Complaints of leg weakness 01/24/2020   Compression fracture of T11 vertebra (HCC) 01/23/2020  Leg pain 01/03/2020   Chronic anticoagulation 01/03/2020   Compression fracture of L1 lumbar vertebra (HCC) 07/27/2019   Low back pain 07/20/2019   Hyperglycemia 05/01/2019   Constipation 11/16/2018   Hypokalemia    AVM (arteriovenous malformation) of colon    Lower GI bleed 11/06/2018   Iron deficiency anemia 11/02/2018   Lymphedema of left lower extremity 10/22/2018   B12 deficiency 05/27/2018   Dependent edema 05/25/2018   Pulmonary hypertension, primary (HCC)  05/20/2018   Dyslipidemia 05/20/2018   CHF (congestive heart failure) (HCC) 05/20/2018   Tricuspid regurgitation 05/17/2018   Mitral regurgitation 05/17/2018   Chronic a-fib 05/17/2018   Essential hypertension 05/17/2018   Hypothyroid 05/17/2018   CAD (coronary artery disease) 05/17/2018   Carotid atherosclerosis, bilateral 05/17/2018   Sedalia Muta, PT, DPT 8:42 PM  07/31/21    [facility] [address] [place], [place], [address] Phone: [phone]   Fax:  [phone]  Name: Grace Alvarado MRN: [id number] Date of Birth: Jun 02, 1925

## 2021-07-31 NOTE — Patient Instructions (Signed)
Access Code: [email or web address] URL: [email or web address] Date: 07/31/2021 Prepared by: Sedalia Muta  Exercises Seated Hamstring Stretch - 2 x daily - 3 reps - 30 hold Seated Knee Extension AROM - 2 x daily - 1 sets - 10 reps

## 2021-08-01 ENCOUNTER — Encounter: Payer: Self-pay | Admitting: Internal Medicine

## 2021-08-05 ENCOUNTER — Ambulatory Visit: Payer: Medicare Other

## 2021-08-07 ENCOUNTER — Other Ambulatory Visit: Payer: Self-pay

## 2021-08-07 ENCOUNTER — Ambulatory Visit: Payer: Medicare Other

## 2021-08-07 ENCOUNTER — Ambulatory Visit (INDEPENDENT_AMBULATORY_CARE_PROVIDER_SITE_OTHER): Payer: Medicare Other

## 2021-08-07 DIAGNOSIS — E538 Deficiency of other specified B group vitamins: Secondary | ICD-10-CM

## 2021-08-07 MED ORDER — CYANOCOBALAMIN 1000 MCG/ML IJ SOLN
1000.0000 ug | Freq: Once | INTRAMUSCULAR | Status: AC
  Administered 2021-08-07: 1000 ug via INTRAMUSCULAR

## 2021-08-07 NOTE — Progress Notes (Signed)
Pt was given B12 injection w/o any complications. 

## 2021-08-08 ENCOUNTER — Encounter: Payer: Self-pay | Admitting: Internal Medicine

## 2021-08-14 ENCOUNTER — Ambulatory Visit: Payer: Medicare Other | Admitting: Internal Medicine

## 2021-08-14 ENCOUNTER — Encounter: Payer: Self-pay | Admitting: Internal Medicine

## 2021-08-14 ENCOUNTER — Other Ambulatory Visit: Payer: Self-pay

## 2021-08-14 VITALS — BP 118/80 | HR 100 | Temp 98.2°F | Ht 63.0 in | Wt 137.0 lb

## 2021-08-14 DIAGNOSIS — R350 Frequency of micturition: Secondary | ICD-10-CM | POA: Diagnosis not present

## 2021-08-14 DIAGNOSIS — E038 Other specified hypothyroidism: Secondary | ICD-10-CM

## 2021-08-14 DIAGNOSIS — R5383 Other fatigue: Secondary | ICD-10-CM | POA: Diagnosis not present

## 2021-08-14 DIAGNOSIS — E538 Deficiency of other specified B group vitamins: Secondary | ICD-10-CM | POA: Diagnosis not present

## 2021-08-14 DIAGNOSIS — D509 Iron deficiency anemia, unspecified: Secondary | ICD-10-CM | POA: Diagnosis not present

## 2021-08-14 LAB — COMPREHENSIVE METABOLIC PANEL
ALT: 20 U/L (ref 0–35)
AST: 21 U/L (ref 0–37)
Albumin: 4.2 g/dL (ref 3.5–5.2)
Alkaline Phosphatase: 60 U/L (ref 39–117)
BUN: 12 mg/dL (ref 6–23)
CO2: 31 mEq/L (ref 19–32)
Calcium: 10 mg/dL (ref 8.4–10.5)
Chloride: 102 mEq/L (ref 96–112)
Creatinine, Ser: 0.61 mg/dL (ref 0.40–1.20)
GFR: 76.71 mL/min (ref >60.00)
Glucose, Bld: 95 mg/dL (ref 70–99)
Potassium: 4.2 mEq/L (ref 3.5–5.1)
Sodium: 138 mEq/L (ref 135–145)
Total Bilirubin: 0.7 mg/dL (ref 0.2–1.2)
Total Protein: 7.4 g/dL (ref 6.0–8.3)

## 2021-08-14 LAB — CBC WITH DIFFERENTIAL/PLATELET
Basophils Absolute: 0 10*3/uL (ref 0.0–0.1)
Basophils Relative: 0.4 % (ref 0.0–3.0)
Eosinophils Absolute: 0.1 10*3/uL (ref 0.0–0.7)
Eosinophils Relative: 1.6 % (ref 0.0–5.0)
HCT: 34.3 % — ABNORMAL LOW (ref 36.0–46.0)
Hemoglobin: 11.2 g/dL — ABNORMAL LOW (ref 12.0–15.0)
Lymphocytes Relative: 33.9 % (ref 12.0–46.0)
Lymphs Abs: 1.4 10*3/uL (ref 0.7–4.0)
MCHC: 32.7 g/dL (ref 30.0–36.0)
MCV: 94.5 fl (ref 78.0–100.0)
Monocytes Absolute: 0.5 10*3/uL (ref 0.1–1.0)
Monocytes Relative: 12.3 % — ABNORMAL HIGH (ref 3.0–12.0)
Neutro Abs: 2.1 10*3/uL (ref 1.4–7.7)
Neutrophils Relative %: 51.8 % (ref 43.0–77.0)
Platelets: 148 10*3/uL — ABNORMAL LOW (ref 150.0–400.0)
RBC: 3.63 Mil/uL — ABNORMAL LOW (ref 3.87–5.11)
RDW: 14.2 % (ref 11.5–15.5)
WBC: 4.1 10*3/uL (ref 4.0–10.5)

## 2021-08-14 LAB — TSH: TSH: 1.81 u[IU]/mL (ref 0.35–5.50)

## 2021-08-14 LAB — VITAMIN B12: Vitamin B-12: 881 pg/mL (ref 211–911)

## 2021-08-14 NOTE — Patient Instructions (Addendum)
? ? ? ?  Blood work and urine tests were ordered.   ? ? ? ? ?Medications changes include :   none ? ? ?

## 2021-08-14 NOTE — Assessment & Plan Note (Signed)
Acute Started a few weeks ago and is better ? Related to gabapentin which she no longer is taking - it is out of her system so should continue to improve Check cbc, cmp, B12, ua, ucx  Since improving and doing well - if above normal - just monitor

## 2021-08-14 NOTE — Assessment & Plan Note (Signed)
Chronic  Clinically euthyroid Currently taking levothyroxine 75 mcg daily Check tsh  Titrate med dose if needed  

## 2021-08-14 NOTE — Assessment & Plan Note (Signed)
H/o of GI bleed Has low iron, mild anemia  Check cbc, iron panel

## 2021-08-14 NOTE — Assessment & Plan Note (Signed)
Chronic Continue B12 monthly injections Check B12 level

## 2021-08-14 NOTE — Assessment & Plan Note (Signed)
Ua, ucx 

## 2021-08-14 NOTE — Progress Notes (Signed)
Subjective:    Patient ID: Grace Alvarado, female    DOB: Nov 22, 1924, 87 y.o.   MRN: [id number]  This visit occurred during the SARS-CoV-2 public health emergency.  Safety protocols were in place, including screening questions prior to the visit, additional usage of staff PPE, and extensive cleaning of exam room while observing appropriate contact time as indicated for disinfecting solutions.    HPI The patient is here for an acute visit.   Fatigue - it started a few weeks ago - it is better.  She was falling asleep when sitting.  She has been sleeping well.  She states the fatigue has improved.  She is eating well.    She was on gabapentin and is no longer on it - she wonders if that was the cause. She denies decreased stamina, SOB, urine symptoms   Medications and allergies reviewed with patient and updated if appropriate.  Patient Active Problem List   Diagnosis Date Noted   Fatigue 08/14/2021   Urinary frequency 08/14/2021   Lumbar radiculopathy 06/25/2021   Rib pain on left side 09/27/2020   LUQ pain 09/17/2020   Pilonidal cyst 09/17/2020   Allergic rhinitis 07/03/2020   Pressure injury of sacral region, stage 1 07/03/2020   Aortic atherosclerosis (HCC) 06/13/2020   DNR (do not resuscitate) 03/07/2020   Prepatellar bursitis, left knee 03/02/2020   Neuropathy 01/24/2020   Chronic back pain 01/24/2020   Complaints of leg weakness 01/24/2020   Compression fracture of T11 vertebra (HCC) 01/23/2020   Leg pain 01/03/2020   Chronic anticoagulation 01/03/2020   Compression fracture of L1 lumbar vertebra (HCC) 07/27/2019   Low back pain 07/20/2019   Hyperglycemia 05/01/2019   Constipation 11/16/2018   AVM (arteriovenous malformation) of colon    Lower GI bleed 11/06/2018   Iron deficiency anemia 11/02/2018   Lymphedema of left lower extremity 10/22/2018   B12 deficiency 05/27/2018   Dependent edema 05/25/2018   Pulmonary hypertension, primary (HCC) 05/20/2018    Dyslipidemia 05/20/2018   CHF (congestive heart failure) (HCC) 05/20/2018   Tricuspid regurgitation 05/17/2018   Mitral regurgitation, mod-sev 2020 05/17/2018   Chronic a-fib 05/17/2018   Essential hypertension 05/17/2018   Hypothyroid 05/17/2018   CAD (coronary artery disease) 05/17/2018   Carotid atherosclerosis, bilateral 05/17/2018    Current Outpatient Medications on File Prior to Visit  Medication Sig Dispense Refill   acetaminophen (TYLENOL) 325 MG tablet Take 1-2 tablets (325-650 mg total) by mouth every 6 (six) hours as needed for mild pain (pain score 1-3 or temp > 100.5).     acidophilus (RISAQUAD) CAPS capsule Take 1 capsule by mouth daily.     acyclovir ointment (ZOVIRAX) 5 % Apply 1 application topically 2 (two) times daily as needed. 5 g 0   B Complex-C (B-COMPLEX WITH VITAMIN C) tablet Take 1 tablet by mouth daily.     diltiazem (CARDIZEM SR) 120 MG 12 hr capsule Take 1 capsule (120 mg total) by mouth 2 (two) times daily. 180 capsule 3   furosemide (LASIX) 20 MG tablet TAKE 1 TABLET BY MOUTH EVERY OTHER DAY 45 tablet 3   KLOR-CON M10 10 MEQ tablet TAKE 1 TABLET (10 MEQ TOTAL) BY MOUTH EVERY OTHER DAY. 45 tablet 3   levothyroxine (SYNTHROID) 75 MCG tablet TAKE 1 TABLET BY MOUTH EVERY DAY BEFORE BREAKFAST 90 tablet 1   polyethylene glycol (MIRALAX) 17 g packet Take 17 g by mouth daily. Hold for loose bowel movements 60 each 0   pravastatin (PRAVACHOL)  10 MG tablet Take 1 tablet (10 mg total) by mouth daily. 90 tablet 3   Rivaroxaban (XARELTO) 15 MG TABS tablet TAKE 1 TABLET BY MOUTH EVERY DAY WITH SUPPER 90 tablet 1   No current facility-administered medications on file prior to visit.    Past Medical History:  Diagnosis Date   Atrial fibrillation (HCC)    Hyperlipidemia    Hypertension    Hypothyroidism    Mitral regurgitation    MVP (mitral valve prolapse)     Past Surgical History:  Procedure Laterality Date   APPENDECTOMY     c section     CESAREAN SECTION      CHOLECYSTECTOMY     COLONOSCOPY WITH PROPOFOL N/A 11/09/2018   Procedure: COLONOSCOPY WITH PROPOFOL;  Surgeon: Doran Stabler, MD;  Location: [place];  Service: Gastroenterology;  Laterality: N/A;   ESOPHAGOGASTRODUODENOSCOPY (EGD) WITH PROPOFOL N/A 11/09/2018   Procedure: ESOPHAGOGASTRODUODENOSCOPY (EGD) WITH PROPOFOL;  Surgeon: Doran Stabler, MD;  Location: [place];  Service: Gastroenterology;  Laterality: N/A;   HOT HEMOSTASIS N/A 11/09/2018   Procedure: HOT HEMOSTASIS (ARGON PLASMA COAGULATION/BICAP);  Surgeon: Doran Stabler, MD;  Location: [place];  Service: Gastroenterology;  Laterality: N/A;   I & D EXTREMITY Left 03/02/2020   Procedure: evacuation of left knee hematoma, possible wound vac placement;  Surgeon: Meredith Pel, MD;  Location: [place];  Service: Orthopedics;  Laterality: Left;   SUBMUCOSAL INJECTION  11/09/2018   Procedure: SUBMUCOSAL INJECTION;  Surgeon: Doran Stabler, MD;  Location: [facility] ENDOSCOPY;  Service: Gastroenterology;;    Social History   Socioeconomic History   Marital status: Widowed    Spouse name: Not on file   Number of children: 3   Years of education: Not on file   Highest education level: Not on file  Occupational History   Occupation: retired  Tobacco Use   Smoking status: Former   Smokeless tobacco: Never  Scientific laboratory technician Use: Never used  Substance and Sexual Activity   Alcohol use: Not Currently   Drug use: Never   Sexual activity: Not Currently  Other Topics Concern   Not on file  Social History Narrative   Not on file   Social Determinants of Health   Financial Resource Strain: Not on file  Food Insecurity: Not on file  Transportation Needs: Not on file  Physical Activity: Not on file  Stress: Not on file  Social Connections: Not on file    Family History  Problem Relation Age of Onset   Cancer Mother    Dementia Mother     Review of Systems  Constitutional:  Negative for chills and  fever.  HENT:  Positive for postnasal drip and rhinorrhea.   Respiratory:  Negative for cough, shortness of breath and wheezing.   Cardiovascular:  Positive for leg swelling. Negative for chest pain and palpitations.  Gastrointestinal:  Negative for abdominal pain and blood in stool (no black).  Genitourinary:  Positive for frequency (chronic). Negative for dysuria and hematuria.       No cloudy urine/ odor  Neurological:  Negative for light-headedness and headaches.      Objective:   Vitals:   08/14/21 1431  BP: 118/80  Pulse: 100  Temp: 98.2 F (36.8 C)  SpO2: 97%   BP Readings from Last 3 Encounters:  08/14/21 118/80  07/16/21 120/62  06/25/21 116/80   Wt Readings from Last 3 Encounters:  08/14/21 137 lb (62.1  kg)  07/16/21 138 lb (62.6 kg)  06/25/21 137 lb (62.1 kg)   Body mass index is 24.27 kg/m.   Physical Exam    Constitutional: Appears well-developed and well-nourished. No distress.  Head: Normocephalic and atraumatic.  Neck: Neck supple. No tracheal deviation present. No thyromegaly present.  No cervical lymphadenopathy Cardiovascular: Normal rate, regular rhythm and normal heart sounds.  No murmur heard. No carotid bruit .  No edema Pulmonary/Chest: Effort normal and breath sounds normal. No respiratory distress. No has no wheezes. No rales.  Skin: Skin is warm and dry. Not diaphoretic.  Psychiatric: Normal mood and affect. Behavior is normal.       Assessment & Plan:    See Problem List for Assessment and Plan of chronic medical problems.

## 2021-08-15 ENCOUNTER — Ambulatory Visit (INDEPENDENT_AMBULATORY_CARE_PROVIDER_SITE_OTHER): Payer: Medicare Other | Admitting: Physical Therapy

## 2021-08-15 ENCOUNTER — Other Ambulatory Visit: Payer: Self-pay

## 2021-08-15 DIAGNOSIS — R2689 Other abnormalities of gait and mobility: Secondary | ICD-10-CM | POA: Diagnosis not present

## 2021-08-15 DIAGNOSIS — M6281 Muscle weakness (generalized): Secondary | ICD-10-CM

## 2021-08-15 DIAGNOSIS — R350 Frequency of micturition: Secondary | ICD-10-CM

## 2021-08-15 DIAGNOSIS — M79604 Pain in right leg: Secondary | ICD-10-CM

## 2021-08-15 LAB — URINALYSIS, ROUTINE W REFLEX MICROSCOPIC
Bilirubin Urine: NEGATIVE
Ketones, ur: NEGATIVE
Leukocytes,Ua: NEGATIVE
Nitrite: NEGATIVE
RBC / HPF: NONE SEEN (ref 0–?)
Specific Gravity, Urine: 1.025 (ref 1.000–1.030)
Total Protein, Urine: NEGATIVE
Urine Glucose: NEGATIVE
Urobilinogen, UA: 0.2 (ref 0.0–1.0)
pH: 6 (ref 5.0–8.0)

## 2021-08-15 NOTE — Addendum Note (Signed)
Addended by: Waldemar Dickens B on: 08/15/2021 09:32 AM   Modules accepted: Orders

## 2021-08-16 ENCOUNTER — Encounter: Payer: Self-pay | Admitting: Physical Therapy

## 2021-08-16 NOTE — Patient Instructions (Signed)
Access Code: [email or web address] URL: [email or web address] Date: 08/16/2021 Prepared by: Sedalia Muta  Exercises Heel rises with counter support - 1 x daily - 2 sets - 10 reps Standing March with Counter Support - 1 x daily - 2 sets - 10 reps Standing Hip Abduction with Counter Support - 1 x daily - 2 sets - 10 reps Sit to Stand - 1-2 x daily - 1 sets - 10 reps Seated Scapular Retraction - 2 x daily - 1 sets - 10 reps Seated Knee Extension AROM - 1 x daily - 2 sets - 10 reps Seated Hamstring Stretch - 2 x daily - 3 reps - 30 hold

## 2021-08-16 NOTE — Therapy (Signed)
[facility] [address] [place], [place], [address] Phone: [phone]   Fax:  [phone]  Physical Therapy Treatment  Patient Details  Name: Grace Alvarado MRN: [id number] Date of Birth: 1925-06-30 Referring Provider (PT): Cheryll Cockayne   Encounter Date: 08/15/2021   PT End of Session - 08/16/21 4010     Visit Number 2    Number of Visits 12    Date for PT Re-Evaluation 09/11/21    Authorization Type UHC    PT Start Time 1432    PT Stop Time 1512    PT Time Calculation (min) 40 min    Equipment Utilized During Treatment Gait belt    Activity Tolerance Patient limited by pain    Behavior During Therapy WFL for tasks assessed/performed             Past Medical History:  Diagnosis Date   Atrial fibrillation (HCC)    Hyperlipidemia    Hypertension    Hypothyroidism    Mitral regurgitation    MVP (mitral valve prolapse)     Past Surgical History:  Procedure Laterality Date   APPENDECTOMY     c section     CESAREAN SECTION     CHOLECYSTECTOMY     COLONOSCOPY WITH PROPOFOL N/A 11/09/2018   Procedure: COLONOSCOPY WITH PROPOFOL;  Surgeon: Sherrilyn Rist, MD;  Location: [facility] ENDOSCOPY;  Service: Gastroenterology;  Laterality: N/A;   ESOPHAGOGASTRODUODENOSCOPY (EGD) WITH PROPOFOL N/A 11/09/2018   Procedure: ESOPHAGOGASTRODUODENOSCOPY (EGD) WITH PROPOFOL;  Surgeon: Sherrilyn Rist, MD;  Location: [facility] ENDOSCOPY;  Service: Gastroenterology;  Laterality: N/A;   HOT HEMOSTASIS N/A 11/09/2018   Procedure: HOT HEMOSTASIS (ARGON PLASMA COAGULATION/BICAP);  Surgeon: Sherrilyn Rist, MD;  Location: [facility] ENDOSCOPY;  Service: Gastroenterology;  Laterality: N/A;   I & D EXTREMITY Left 03/02/2020   Procedure: evacuation of left knee hematoma, possible wound vac placement;  Surgeon: Cammy Copa, MD;  Location: [facility] OR;  Service: Orthopedics;  Laterality: Left;   SUBMUCOSAL INJECTION  11/09/2018   Procedure: SUBMUCOSAL INJECTION;   Surgeon: Sherrilyn Rist, MD;  Location: [facility] ENDOSCOPY;  Service: Gastroenterology;;    There were no vitals filed for this visit.   Subjective Assessment - 08/16/21 0919     Subjective Pt and daughter report that pt has had much improved pain since last visit. She reports only mild tenderness behind R knee. She states not being confident with her mobility and she is afraid to fall. Is using RW at all times.    Currently in Pain? Yes    Pain Score 3     Pain Location Leg    Pain Orientation Right;Posterior    Pain Descriptors / Indicators Sore    Pain Type Acute pain    Pain Onset More than a month ago    Pain Frequency Intermittent                               [facility] Adult PT Treatment/Exercise - 08/16/21 0001       Ambulation/Gait   Gait Comments 35 ft x 8 CGA, no AD,  bwd walking 20 ft x 4;      Exercises   Exercises Lumbar      Lumbar Exercises: Stretches   Active Hamstring Stretch 3 reps;30 seconds;Right    Active Hamstring Stretch Limitations seated      Lumbar Exercises: Standing   Heel Raises  20 reps    Other Standing Lumbar Exercises Hip abd 2x10 bil;    Other Standing Lumbar Exercises Step ups x 10 bil, 1 UE suppor, 4 in,  Stairs, 5 steps, 1 UE support, recipricol x 5;      Lumbar Exercises: Seated   Long Arc Quad on Chair 10 reps;2 sets;Both    Sit to Stand 10 reps   light use of UEs.                    PT Education - 08/16/21 0921     Education Details Reviwed HEP from previous PT , and updated to include stretch/strength for R LE.    Person(s) Educated Patient;Child(ren)    Methods Explanation;Demonstration;Tactile cues;Handout    Comprehension Verbalized understanding;Returned demonstration;Verbal cues required;Tactile cues required;Need further instruction              PT Short Term Goals - 07/31/21 2009       PT SHORT TERM GOAL #1   Title Pt to be independent with initial HEP    Time 2    Period Weeks     Status New    Target Date 08/14/21      PT SHORT TERM GOAL #2   Title Pt to report decreased pain in R leg to 0-4/10    Time 2    Period Weeks    Target Date 08/14/21               PT Long Term Goals - 07/31/21 2009       PT LONG TERM GOAL #1   Title Pt to be independent with final HEP    Time 6    Period Weeks    Status New    Target Date 09/11/21      PT LONG TERM GOAL #2   Title Pt to report decreased pain in R LE to 0-2/10 with activity,    Time 6    Period Weeks    Status New    Target Date 09/11/21      PT LONG TERM GOAL #3   Title Pt to demo ability for sit to stand on 1st attempt with minimal UE support    Time 6    Period Weeks    Status New    Target Date 09/11/21      PT LONG TERM GOAL #4   Title gait and balance goal TBD                   Plan - 08/16/21 0923     Clinical Impression Statement Pt with much improved pain since last visit. Overall much improved ability for participation today. She does have tenderness and pain in posterior knee/popliteal region, into hamstring. She is able to ambulate without AD today, but with decreased stability, recommended continued use of RW. She will benefit from continued strengthening for LEs, gait, and balance.    Personal Factors and Comorbidities Age    Examination-Activity Limitations Bed Mobility;Bend;Squat;Stairs;Stand;Lift;Locomotion Level;Transfers    Examination-Participation Restrictions Cleaning;Community Activity;Shop;Meal Prep    Stability/Clinical Decision Making Evolving/Moderate complexity    Rehab Potential Good    PT Frequency 2x / week    PT Duration 6 weeks    PT Treatment/Interventions ADLs/Self Care Home Management;Cryotherapy;Electrical Stimulation;Iontophoresis 4mg /ml Dexamethasone;Moist Heat;Traction;Balance training;Therapeutic exercise;Therapeutic activities;Functional mobility training;Stair training;Gait training;DME Instruction;Neuromuscular re-education;Patient/family  education;Orthotic Fit/Training;Manual techniques;Passive range of motion;Dry needling;Taping;Energy conservation;Spinal Manipulations;Joint Manipulations    PT Home Exercise Plan [id number]  Consulted and Agree with Plan of Care Patient             Patient will benefit from skilled therapeutic intervention in order to improve the following deficits and impairments:  Abnormal gait, Decreased endurance, Decreased activity tolerance, Pain, Decreased strength, Decreased balance, Decreased mobility, Decreased range of motion, Impaired flexibility  Visit Diagnosis: Pain in right leg  Other abnormalities of gait and mobility  Muscle weakness (generalized)     Problem List Patient Active Problem List   Diagnosis Date Noted   Fatigue 08/14/2021   Urinary frequency 08/14/2021   Lumbar radiculopathy 06/25/2021   Rib pain on left side 09/27/2020   LUQ pain 09/17/2020   Pilonidal cyst 09/17/2020   Allergic rhinitis 07/03/2020   Pressure injury of sacral region, stage 1 07/03/2020   Aortic atherosclerosis (HCC) 06/13/2020   DNR (do not resuscitate) 03/07/2020   Prepatellar bursitis, left knee 03/02/2020   Neuropathy 01/24/2020   Chronic back pain 01/24/2020   Complaints of leg weakness 01/24/2020   Compression fracture of T11 vertebra (HCC) 01/23/2020   Leg pain 01/03/2020   Chronic anticoagulation 01/03/2020   Compression fracture of L1 lumbar vertebra (HCC) 07/27/2019   Low back pain 07/20/2019   Hyperglycemia 05/01/2019   Constipation 11/16/2018   AVM (arteriovenous malformation) of colon    Lower GI bleed 11/06/2018   Iron deficiency anemia 11/02/2018   Lymphedema of left lower extremity 10/22/2018   B12 deficiency 05/27/2018   Dependent edema 05/25/2018   Pulmonary hypertension, primary (HCC) 05/20/2018   Dyslipidemia 05/20/2018   CHF (congestive heart failure) (HCC) 05/20/2018   Tricuspid regurgitation 05/17/2018   Mitral regurgitation, mod-sev 2020 05/17/2018    Chronic a-fib 05/17/2018   Essential hypertension 05/17/2018   Hypothyroid 05/17/2018   CAD (coronary artery disease) 05/17/2018   Carotid atherosclerosis, bilateral 05/17/2018   Sedalia Muta, PT, DPT 9:31 AM  08/16/21    [facility] [address] [place], [place], [address] Phone: [phone]   Fax:  [phone]  Name: Grace Alvarado MRN: [id number] Date of Birth: [birth year]

## 2021-08-17 LAB — CULTURE, URINE COMPREHENSIVE

## 2021-08-19 LAB — IRON,TIBC AND FERRITIN PANEL
%SAT: 11 % (calc) — ABNORMAL LOW (ref 16–45)
Ferritin: 13 ng/mL — ABNORMAL LOW (ref 16–288)
Iron: 42 ug/dL — ABNORMAL LOW (ref 45–160)
TIBC: 398 mcg/dL (calc) (ref 250–450)

## 2021-08-19 LAB — URINE CULTURE

## 2021-08-20 ENCOUNTER — Encounter: Payer: Medicare Other | Admitting: Physical Therapy

## 2021-08-21 ENCOUNTER — Other Ambulatory Visit: Payer: Self-pay

## 2021-08-21 ENCOUNTER — Ambulatory Visit (INDEPENDENT_AMBULATORY_CARE_PROVIDER_SITE_OTHER): Payer: Medicare Other | Admitting: Physical Therapy

## 2021-08-21 ENCOUNTER — Encounter: Payer: Self-pay | Admitting: Physical Therapy

## 2021-08-21 DIAGNOSIS — M6281 Muscle weakness (generalized): Secondary | ICD-10-CM | POA: Diagnosis not present

## 2021-08-21 DIAGNOSIS — M79604 Pain in right leg: Secondary | ICD-10-CM

## 2021-08-21 DIAGNOSIS — R2689 Other abnormalities of gait and mobility: Secondary | ICD-10-CM

## 2021-08-21 NOTE — Therapy (Signed)
[facility] [address] [place], [place], [address] Phone: [phone]   Fax:  [phone]  Physical Therapy Treatment  Patient Details  Name: Grace Alvarado MRN: [id number] Date of Birth: Sep 17, 1925 Referring Provider (PT): Cheryll Cockayne   Encounter Date: 08/21/2021   PT End of Session - 08/21/21 0935     Visit Number 3    Number of Visits 12    Date for PT Re-Evaluation 09/11/21    Authorization Type UHC    PT Start Time 0930    PT Stop Time 1014    PT Time Calculation (min) 44 min    Equipment Utilized During Treatment Gait belt    Activity Tolerance Patient limited by pain    Behavior During Therapy WFL for tasks assessed/performed             Past Medical History:  Diagnosis Date   Atrial fibrillation (HCC)    Hyperlipidemia    Hypertension    Hypothyroidism    Mitral regurgitation    MVP (mitral valve prolapse)     Past Surgical History:  Procedure Laterality Date   APPENDECTOMY     c section     CESAREAN SECTION     CHOLECYSTECTOMY     COLONOSCOPY WITH PROPOFOL N/A 11/09/2018   Procedure: COLONOSCOPY WITH PROPOFOL;  Surgeon: Sherrilyn Rist, MD;  Location: [facility] ENDOSCOPY;  Service: Gastroenterology;  Laterality: N/A;   ESOPHAGOGASTRODUODENOSCOPY (EGD) WITH PROPOFOL N/A 11/09/2018   Procedure: ESOPHAGOGASTRODUODENOSCOPY (EGD) WITH PROPOFOL;  Surgeon: Sherrilyn Rist, MD;  Location: [facility] ENDOSCOPY;  Service: Gastroenterology;  Laterality: N/A;   HOT HEMOSTASIS N/A 11/09/2018   Procedure: HOT HEMOSTASIS (ARGON PLASMA COAGULATION/BICAP);  Surgeon: Sherrilyn Rist, MD;  Location: [facility] ENDOSCOPY;  Service: Gastroenterology;  Laterality: N/A;   I & D EXTREMITY Left 03/02/2020   Procedure: evacuation of left knee hematoma, possible wound vac placement;  Surgeon: Cammy Copa, MD;  Location: [facility] OR;  Service: Orthopedics;  Laterality: Left;   SUBMUCOSAL INJECTION  11/09/2018   Procedure: SUBMUCOSAL INJECTION;   Surgeon: Sherrilyn Rist, MD;  Location: [facility] ENDOSCOPY;  Service: Gastroenterology;;    There were no vitals filed for this visit.   Subjective Assessment - 08/21/21 0931     Subjective Pt with no new complaints.    Currently in Pain? No/denies    Pain Score 0-No pain                               [facility] Adult PT Treatment/Exercise - 08/21/21 0001       Ambulation/Gait   Gait Comments 35 ft x 8 CGA, no AD,      Exercises   Exercises Lumbar      Lumbar Exercises: Stretches   Active Hamstring Stretch 30 seconds;Right;4 reps    Active Hamstring Stretch Limitations seated      Lumbar Exercises: Aerobic   Recumbent Bike L1 x 4 min;      Lumbar Exercises: Standing   Heel Raises 20 reps    Other Standing Lumbar Exercises March x 20 CGA, no UE support,  Fwd, bwd and lateral stepping with weight shifting x 15 ea bil;    Other Standing Lumbar Exercises Step ups x 10 bil, 1 UE suppor, 4 in,  Stairs, 5 steps, 1 UE support, recipricol x 5;      Lumbar Exercises: Seated   Long Arc Quad on Chair  10 reps;2 sets;Both    Sit to Stand 10 reps   light use of UEs.     Manual Therapy   Manual Therapy Soft tissue mobilization    Soft tissue mobilization manual soft tissue release to R hamstring , in sidelying                       PT Short Term Goals - 07/31/21 2009       PT SHORT TERM GOAL #1   Title Pt to be independent with initial HEP    Time 2    Period Weeks    Status New    Target Date 08/14/21      PT SHORT TERM GOAL #2   Title Pt to report decreased pain in R leg to 0-4/10    Time 2    Period Weeks    Target Date 08/14/21               PT Long Term Goals - 07/31/21 2009       PT LONG TERM GOAL #1   Title Pt to be independent with final HEP    Time 6    Period Weeks    Status New    Target Date 09/11/21      PT LONG TERM GOAL #2   Title Pt to report decreased pain in R LE to 0-2/10 with activity,    Time 6    Period  Weeks    Status New    Target Date 09/11/21      PT LONG TERM GOAL #3   Title Pt to demo ability for sit to stand on 1st attempt with minimal UE support    Time 6    Period Weeks    Status New    Target Date 09/11/21      PT LONG TERM GOAL #4   Title gait and balance goal TBD                   Plan - 08/21/21 1312     Clinical Impression Statement Pt with improving pain in leg with activity, pt reports no pain with activity today. She does have quite a bit of pain with palpation and stretching of R hamstring vs L. Pt able to progress standing balance today. She fatigues towards end of session, with walking and stairs,with increased difficulty with exercises.    Personal Factors and Comorbidities Age    Examination-Activity Limitations Bed Mobility;Bend;Squat;Stairs;Stand;Lift;Locomotion Level;Transfers    Examination-Participation Restrictions Cleaning;Community Activity;Shop;Meal Prep    Stability/Clinical Decision Making Evolving/Moderate complexity    Rehab Potential Good    PT Frequency 2x / week    PT Duration 6 weeks    PT Treatment/Interventions ADLs/Self Care Home Management;Cryotherapy;Electrical Stimulation;Iontophoresis 4mg /ml Dexamethasone;Moist Heat;Traction;Balance training;Therapeutic exercise;Therapeutic activities;Functional mobility training;Stair training;Gait training;DME Instruction;Neuromuscular re-education;Patient/family education;Orthotic Fit/Training;Manual techniques;Passive range of motion;Dry needling;Taping;Energy conservation;Spinal Manipulations;Joint Manipulations    PT Home Exercise Plan [id number]    Consulted and Agree with Plan of Care Patient             Patient will benefit from skilled therapeutic intervention in order to improve the following deficits and impairments:  Abnormal gait, Decreased endurance, Decreased activity tolerance, Pain, Decreased strength, Decreased balance, Decreased mobility, Decreased range of motion, Impaired  flexibility  Visit Diagnosis: Pain in right leg  Muscle weakness (generalized)  Other abnormalities of gait and mobility     Problem List Patient Active Problem List   Diagnosis  Date Noted   Fatigue 08/14/2021   Urinary frequency 08/14/2021   Lumbar radiculopathy 06/25/2021   Rib pain on left side 09/27/2020   LUQ pain 09/17/2020   Pilonidal cyst 09/17/2020   Allergic rhinitis 07/03/2020   Pressure injury of sacral region, stage 1 07/03/2020   Aortic atherosclerosis (HCC) 06/13/2020   DNR (do not resuscitate) 03/07/2020   Prepatellar bursitis, left knee 03/02/2020   Neuropathy 01/24/2020   Chronic back pain 01/24/2020   Complaints of leg weakness 01/24/2020   Compression fracture of T11 vertebra (HCC) 01/23/2020   Leg pain 01/03/2020   Chronic anticoagulation 01/03/2020   Compression fracture of L1 lumbar vertebra (HCC) 07/27/2019   Low back pain 07/20/2019   Hyperglycemia 05/01/2019   Constipation 11/16/2018   AVM (arteriovenous malformation) of colon    Lower GI bleed 11/06/2018   Iron deficiency anemia 11/02/2018   Lymphedema of left lower extremity 10/22/2018   B12 deficiency 05/27/2018   Dependent edema 05/25/2018   Pulmonary hypertension, primary (HCC) 05/20/2018   Dyslipidemia 05/20/2018   CHF (congestive heart failure) (HCC) 05/20/2018   Tricuspid regurgitation 05/17/2018   Mitral regurgitation, mod-sev 2020 05/17/2018   Chronic a-fib 05/17/2018   Essential hypertension 05/17/2018   Hypothyroid 05/17/2018   CAD (coronary artery disease) 05/17/2018   Carotid atherosclerosis, bilateral 05/17/2018    Sedalia Muta, PT, DPT 1:14 PM  08/21/21    [facility] [address] [place], [place], [address] Phone: [phone]   Fax:  [phone]  Name: Grace Alvarado MRN: [id number] Date of Birth: Mar 03, 1925

## 2021-08-26 ENCOUNTER — Other Ambulatory Visit: Payer: Self-pay

## 2021-08-26 ENCOUNTER — Encounter: Payer: Self-pay | Admitting: Physical Therapy

## 2021-08-26 ENCOUNTER — Ambulatory Visit (INDEPENDENT_AMBULATORY_CARE_PROVIDER_SITE_OTHER): Payer: Medicare Other | Admitting: Physical Therapy

## 2021-08-26 DIAGNOSIS — M79604 Pain in right leg: Secondary | ICD-10-CM

## 2021-08-26 DIAGNOSIS — R2689 Other abnormalities of gait and mobility: Secondary | ICD-10-CM

## 2021-08-26 DIAGNOSIS — M6281 Muscle weakness (generalized): Secondary | ICD-10-CM | POA: Diagnosis not present

## 2021-08-26 NOTE — Therapy (Signed)
[facility] [place] [facility] [address] [place], [place], [address] Phone: [phone]   Fax:  [phone]  Physical Therapy Treatment  Patient Details  Name: Grace Alvarado MRN: [id number] Date of Birth: 08/02/1925 Referring Provider (PT): Cheryll Cockayne   Encounter Date: 08/26/2021   PT End of Session - 08/26/21 1536     Visit Number 4    Number of Visits 12    Date for PT Re-Evaluation 09/11/21    Authorization Type UHC    PT Start Time 1300    PT Stop Time 1340    PT Time Calculation (min) 40 min    Equipment Utilized During Treatment Gait belt    Activity Tolerance Patient limited by pain    Behavior During Therapy WFL for tasks assessed/performed             Past Medical History:  Diagnosis Date   Atrial fibrillation (HCC)    Hyperlipidemia    Hypertension    Hypothyroidism    Mitral regurgitation    MVP (mitral valve prolapse)     Past Surgical History:  Procedure Laterality Date   APPENDECTOMY     c section     CESAREAN SECTION     CHOLECYSTECTOMY     COLONOSCOPY WITH PROPOFOL N/A 11/09/2018   Procedure: COLONOSCOPY WITH PROPOFOL;  Surgeon: Sherrilyn Rist, MD;  Location: [facility] ENDOSCOPY;  Service: Gastroenterology;  Laterality: N/A;   ESOPHAGOGASTRODUODENOSCOPY (EGD) WITH PROPOFOL N/A 11/09/2018   Procedure: ESOPHAGOGASTRODUODENOSCOPY (EGD) WITH PROPOFOL;  Surgeon: Sherrilyn Rist, MD;  Location: [facility] ENDOSCOPY;  Service: Gastroenterology;  Laterality: N/A;   HOT HEMOSTASIS N/A 11/09/2018   Procedure: HOT HEMOSTASIS (ARGON PLASMA COAGULATION/BICAP);  Surgeon: Sherrilyn Rist, MD;  Location: [facility] ENDOSCOPY;  Service: Gastroenterology;  Laterality: N/A;   I & D EXTREMITY Left 03/02/2020   Procedure: evacuation of left knee hematoma, possible wound vac placement;  Surgeon: Cammy Copa, MD;  Location: [facility] OR;  Service: Orthopedics;  Laterality: Left;   SUBMUCOSAL INJECTION  11/09/2018   Procedure: SUBMUCOSAL INJECTION;   Surgeon: Sherrilyn Rist, MD;  Location: [facility] ENDOSCOPY;  Service: Gastroenterology;;    There were no vitals filed for this visit.   Subjective Assessment - 08/26/21 1534     Subjective Pt with no new complaints. Feels she is doing a bit better.    Currently in Pain? No/denies    Pain Score 0-No pain                               [facility] Adult PT Treatment/Exercise - 08/26/21 0001       Ambulation/Gait   Gait Comments 35 ft x 8 CGA, no AD,  side stepping 20 ft x 2;  bwd walking 20 ft x 4;      Exercises   Exercises Lumbar      Lumbar Exercises: Stretches   Active Hamstring Stretch 30 seconds;Right;4 reps    Active Hamstring Stretch Limitations seated      Lumbar Exercises: Aerobic   Recumbent Bike L1 x 5 min;      Lumbar Exercises: Standing   Heel Raises 20 reps    Other Standing Lumbar Exercises March x 20 CGA, no UE support,    Other Standing Lumbar Exercises Step ups x 10 bil, 2 UE suppor, 6 in,  Stairs, 5 steps, 1 UE support, recipricol x 5;      Lumbar Exercises:  Seated   Long [employer] on Chair 10 reps;2 sets;Both    LAQ on Chair Weights (lbs) 2    Sit to Stand 10 reps   higher mat table, no use of UEs.     Manual Therapy   Manual Therapy Soft tissue mobilization    Soft tissue mobilization --                       PT Short Term Goals - 07/31/21 2009       PT SHORT TERM GOAL #1   Title Pt to be independent with initial HEP    Time 2    Period Weeks    Status New    Target Date 08/14/21      PT SHORT TERM GOAL #2   Title Pt to report decreased pain in R leg to 0-4/10    Time 2    Period Weeks    Target Date 08/14/21               PT Long Term Goals - 07/31/21 2009       PT LONG TERM GOAL #1   Title Pt to be independent with final HEP    Time 6    Period Weeks    Status New    Target Date 09/11/21      PT LONG TERM GOAL #2   Title Pt to report decreased pain in R LE to 0-2/10 with activity,    Time 6     Period Weeks    Status New    Target Date 09/11/21      PT LONG TERM GOAL #3   Title Pt to demo ability for sit to stand on 1st attempt with minimal UE support    Time 6    Period Weeks    Status New    Target Date 09/11/21      PT LONG TERM GOAL #4   Title gait and balance goal TBD                   Plan - 08/26/21 1537     Clinical Impression Statement Pt improving with ability for ther ex and mobility. She has some difficulty with stairs and sit to stand due to strength in legs, as well as some apprehension of these things due to fear of falling. She has improving stability with standing exercises today, will progress strength and stability as tolerated.    Personal Factors and Comorbidities Age    Examination-Activity Limitations Bed Mobility;Bend;Squat;Stairs;Stand;Lift;Locomotion Level;Transfers    Examination-Participation Restrictions Cleaning;Community Activity;Shop;Meal Prep    Stability/Clinical Decision Making Evolving/Moderate complexity    Rehab Potential Good    PT Frequency 2x / week    PT Duration 6 weeks    PT Treatment/Interventions ADLs/Self Care Home Management;Cryotherapy;Electrical Stimulation;Iontophoresis 4mg /ml Dexamethasone;Moist Heat;Traction;Balance training;Therapeutic exercise;Therapeutic activities;Functional mobility training;Stair training;Gait training;DME Instruction;Neuromuscular re-education;Patient/family education;Orthotic Fit/Training;Manual techniques;Passive range of motion;Dry needling;Taping;Energy conservation;Spinal Manipulations;Joint Manipulations    PT Home Exercise Plan [id number]    Consulted and Agree with Plan of Care Patient             Patient will benefit from skilled therapeutic intervention in order to improve the following deficits and impairments:  Abnormal gait, Decreased endurance, Decreased activity tolerance, Pain, Decreased strength, Decreased balance, Decreased mobility, Decreased range of motion,  Impaired flexibility  Visit Diagnosis: Pain in right leg  Muscle weakness (generalized)  Other abnormalities of gait and mobility  Problem List Patient Active Problem List   Diagnosis Date Noted   Fatigue 08/14/2021   Urinary frequency 08/14/2021   Lumbar radiculopathy 06/25/2021   Rib pain on left side 09/27/2020   LUQ pain 09/17/2020   Pilonidal cyst 09/17/2020   Allergic rhinitis 07/03/2020   Pressure injury of sacral region, stage 1 07/03/2020   Aortic atherosclerosis (HCC) 06/13/2020   DNR (do not resuscitate) 03/07/2020   Prepatellar bursitis, left knee 03/02/2020   Neuropathy 01/24/2020   Chronic back pain 01/24/2020   Complaints of leg weakness 01/24/2020   Compression fracture of T11 vertebra (HCC) 01/23/2020   Leg pain 01/03/2020   Chronic anticoagulation 01/03/2020   Compression fracture of L1 lumbar vertebra (HCC) 07/27/2019   Low back pain 07/20/2019   Hyperglycemia 05/01/2019   Constipation 11/16/2018   AVM (arteriovenous malformation) of colon    Lower GI bleed 11/06/2018   Iron deficiency anemia 11/02/2018   Lymphedema of left lower extremity 10/22/2018   B12 deficiency 05/27/2018   Dependent edema 05/25/2018   Pulmonary hypertension, primary (HCC) 05/20/2018   Dyslipidemia 05/20/2018   CHF (congestive heart failure) (HCC) 05/20/2018   Tricuspid regurgitation 05/17/2018   Mitral regurgitation, mod-sev 2020 05/17/2018   Chronic a-fib 05/17/2018   Essential hypertension 05/17/2018   Hypothyroid 05/17/2018   CAD (coronary artery disease) 05/17/2018   Carotid atherosclerosis, bilateral 05/17/2018    Sedalia Muta, PT, DPT 3:39 PM  08/26/21    [facility] [address] [place] [address] [place], [place], [address] Phone: [phone]   Fax:  [phone]  Name: Grace Alvarado MRN: [id number] Date of Birth: [birth year]

## 2021-09-02 ENCOUNTER — Encounter: Payer: Self-pay | Admitting: Physical Therapy

## 2021-09-02 ENCOUNTER — Other Ambulatory Visit: Payer: Self-pay

## 2021-09-02 ENCOUNTER — Ambulatory Visit (INDEPENDENT_AMBULATORY_CARE_PROVIDER_SITE_OTHER): Payer: Medicare Other | Admitting: Physical Therapy

## 2021-09-02 DIAGNOSIS — M6281 Muscle weakness (generalized): Secondary | ICD-10-CM | POA: Diagnosis not present

## 2021-09-02 DIAGNOSIS — R2689 Other abnormalities of gait and mobility: Secondary | ICD-10-CM | POA: Diagnosis not present

## 2021-09-02 DIAGNOSIS — M79604 Pain in right leg: Secondary | ICD-10-CM | POA: Diagnosis not present

## 2021-09-05 ENCOUNTER — Ambulatory Visit (INDEPENDENT_AMBULATORY_CARE_PROVIDER_SITE_OTHER): Payer: Medicare Other

## 2021-09-05 ENCOUNTER — Encounter: Payer: Self-pay | Admitting: Physical Therapy

## 2021-09-05 ENCOUNTER — Other Ambulatory Visit: Payer: Self-pay

## 2021-09-05 DIAGNOSIS — E538 Deficiency of other specified B group vitamins: Secondary | ICD-10-CM | POA: Diagnosis not present

## 2021-09-05 MED ORDER — CYANOCOBALAMIN 1000 MCG/ML IJ SOLN
1000.0000 ug | Freq: Once | INTRAMUSCULAR | Status: AC
  Administered 2021-09-05: 1000 ug via INTRAMUSCULAR

## 2021-09-05 NOTE — Progress Notes (Signed)
Pt was given B12 w/o any complications. 

## 2021-09-05 NOTE — Therapy (Signed)
[facility] [address] [place], [place], [address] Phone: [phone]   Fax:  [phone]  Physical Therapy Treatment  Patient Details  Name: Grace Alvarado MRN: [id number] Date of Birth: 06-08-1925 Referring Provider (PT): Cheryll Cockayne   Encounter Date: 09/02/2021   PT End of Session - 09/05/21 1236     Visit Number 5    Number of Visits 12    Date for PT Re-Evaluation 09/11/21    Authorization Type UHC    PT Start Time 1302    PT Stop Time 1345    PT Time Calculation (min) 43 min    Equipment Utilized During Treatment Gait belt    Activity Tolerance Patient limited by pain    Behavior During Therapy WFL for tasks assessed/performed             Past Medical History:  Diagnosis Date   Atrial fibrillation (HCC)    Hyperlipidemia    Hypertension    Hypothyroidism    Mitral regurgitation    MVP (mitral valve prolapse)     Past Surgical History:  Procedure Laterality Date   APPENDECTOMY     c section     CESAREAN SECTION     CHOLECYSTECTOMY     COLONOSCOPY WITH PROPOFOL N/A 11/09/2018   Procedure: COLONOSCOPY WITH PROPOFOL;  Surgeon: Sherrilyn Rist, MD;  Location: [facility] ENDOSCOPY;  Service: Gastroenterology;  Laterality: N/A;   ESOPHAGOGASTRODUODENOSCOPY (EGD) WITH PROPOFOL N/A 11/09/2018   Procedure: ESOPHAGOGASTRODUODENOSCOPY (EGD) WITH PROPOFOL;  Surgeon: Sherrilyn Rist, MD;  Location: [facility] ENDOSCOPY;  Service: Gastroenterology;  Laterality: N/A;   HOT HEMOSTASIS N/A 11/09/2018   Procedure: HOT HEMOSTASIS (ARGON PLASMA COAGULATION/BICAP);  Surgeon: Sherrilyn Rist, MD;  Location: [facility] ENDOSCOPY;  Service: Gastroenterology;  Laterality: N/A;   I & D EXTREMITY Left 03/02/2020   Procedure: evacuation of left knee hematoma, possible wound vac placement;  Surgeon: Cammy Copa, MD;  Location: [facility] OR;  Service: Orthopedics;  Laterality: Left;   SUBMUCOSAL INJECTION  11/09/2018   Procedure: SUBMUCOSAL INJECTION;   Surgeon: Sherrilyn Rist, MD;  Location: [facility] ENDOSCOPY;  Service: Gastroenterology;;    There were no vitals filed for this visit.   Subjective Assessment - 09/05/21 1236     Subjective Pt states mild soreness in knees and ankles, but doing ok.    Currently in Pain? Yes    Pain Score 2     Pain Location Knee    Pain Orientation Right;Left    Pain Descriptors / Indicators Aching    Pain Type Acute pain    Pain Onset More than a month ago    Pain Frequency Intermittent                               [facility] Adult PT Treatment/Exercise - 09/05/21 0001       Ambulation/Gait   Gait Comments 35 ft x 8 CGA, no AD,  bwd walking 20 ft x 4;      Exercises   Exercises Lumbar      Lumbar Exercises: Stretches   Active Hamstring Stretch 30 seconds;Right;4 reps    Active Hamstring Stretch Limitations seated      Lumbar Exercises: Aerobic   Recumbent Bike L1 x 6 min;      Lumbar Exercises: Standing   Functional Squats 15 reps    Other Standing Lumbar Exercises March x 20 CGA, no UE  support,    Other Standing Lumbar Exercises Step ups x 10 bil, 1 UE support, 6 in,  Stairs, 5 steps, 1 UE support, recipricol x 5; toe taps on 6 in step x 20;      Lumbar Exercises: Seated   Long Arc Quad on Chair 10 reps;2 sets;Both    LAQ on Chair Weights (lbs) 2    Sit to Stand 10 reps   higher mat table, no use of UEs.     Manual Therapy   Manual Therapy Soft tissue mobilization                       PT Short Term Goals - 09/05/21 1236       PT SHORT TERM GOAL #1   Title Pt to be independent with initial HEP    Time 2    Period Weeks    Status Achieved    Target Date 08/14/21      PT SHORT TERM GOAL #2   Title Pt to report decreased pain in R leg to 0-4/10    Time 2    Period Weeks    Status Achieved    Target Date 08/14/21               PT Long Term Goals - 07/31/21 2009       PT LONG TERM GOAL #1   Title Pt to be independent with final HEP     Time 6    Period Weeks    Status New    Target Date 09/11/21      PT LONG TERM GOAL #2   Title Pt to report decreased pain in R LE to 0-2/10 with activity,    Time 6    Period Weeks    Status New    Target Date 09/11/21      PT LONG TERM GOAL #3   Title Pt to demo ability for sit to stand on 1st attempt with minimal UE support    Time 6    Period Weeks    Status New    Target Date 09/11/21      PT LONG TERM GOAL #4   Title gait and balance goal TBD                   Plan - 09/05/21 1237     Clinical Impression Statement Pt with complaints of soreness in R ankle today, but it does not limit ability for activity today. She is improving with ability and confidence with dynamic balance and stairs. Stairs , dynamic balance, and sit to stand from regular height chair are still challengeing for pt, with need for UE support for all. Plan to continue dynamic balance and stair work for improved stability and safety.    Personal Factors and Comorbidities Age    Examination-Activity Limitations Bed Mobility;Bend;Squat;Stairs;Stand;Lift;Locomotion Level;Transfers    Examination-Participation Restrictions Cleaning;Community Activity;Shop;Meal Prep    Stability/Clinical Decision Making Evolving/Moderate complexity    Rehab Potential Good    PT Frequency 2x / week    PT Duration 6 weeks    PT Treatment/Interventions ADLs/Self Care Home Management;Cryotherapy;Electrical Stimulation;Iontophoresis 4mg /ml Dexamethasone;Moist Heat;Traction;Balance training;Therapeutic exercise;Therapeutic activities;Functional mobility training;Stair training;Gait training;DME Instruction;Neuromuscular re-education;Patient/family education;Orthotic Fit/Training;Manual techniques;Passive range of motion;Dry needling;Taping;Energy conservation;Spinal Manipulations;Joint Manipulations    PT Home Exercise Plan [id number]    Consulted and Agree with Plan of Care Patient             Patient will benefit  from skilled therapeutic intervention in order to improve the following deficits and impairments:  Abnormal gait, Decreased endurance, Decreased activity tolerance, Pain, Decreased strength, Decreased balance, Decreased mobility, Decreased range of motion, Impaired flexibility  Visit Diagnosis: Pain in right leg  Muscle weakness (generalized)  Other abnormalities of gait and mobility     Problem List Patient Active Problem List   Diagnosis Date Noted   Fatigue 08/14/2021   Urinary frequency 08/14/2021   Lumbar radiculopathy 06/25/2021   Rib pain on left side 09/27/2020   LUQ pain 09/17/2020   Pilonidal cyst 09/17/2020   Allergic rhinitis 07/03/2020   Pressure injury of sacral region, stage 1 07/03/2020   Aortic atherosclerosis (HCC) 06/13/2020   DNR (do not resuscitate) 03/07/2020   Prepatellar bursitis, left knee 03/02/2020   Neuropathy 01/24/2020   Chronic back pain 01/24/2020   Complaints of leg weakness 01/24/2020   Compression fracture of T11 vertebra (HCC) 01/23/2020   Leg pain 01/03/2020   Chronic anticoagulation 01/03/2020   Compression fracture of L1 lumbar vertebra (HCC) 07/27/2019   Low back pain 07/20/2019   Hyperglycemia 05/01/2019   Constipation 11/16/2018   AVM (arteriovenous malformation) of colon    Lower GI bleed 11/06/2018   Iron deficiency anemia 11/02/2018   Lymphedema of left lower extremity 10/22/2018   B12 deficiency 05/27/2018   Dependent edema 05/25/2018   Pulmonary hypertension, primary (HCC) 05/20/2018   Dyslipidemia 05/20/2018   CHF (congestive heart failure) (HCC) 05/20/2018   Tricuspid regurgitation 05/17/2018   Mitral regurgitation, mod-sev 2020 05/17/2018   Chronic a-fib 05/17/2018   Essential hypertension 05/17/2018   Hypothyroid 05/17/2018   CAD (coronary artery disease) 05/17/2018   Carotid atherosclerosis, bilateral 05/17/2018     Sedalia Muta, PT, DPT 12:40 PM  09/05/21   [facility]  [address] [place], [place], [address] Phone: [phone]   Fax:  [phone]  Name: Grace Alvarado MRN: [id number] Date of Birth: 03/07/1925

## 2021-09-08 IMAGING — CR DG KNEE COMPLETE 4+V*R*
4 series · 4 of 4 positions shown · non-contrast
Comparison: None.

CLINICAL DATA: Pain status post fall

EXAM:
RIGHT KNEE - COMPLETE 4+ VIEW

[knee ap]
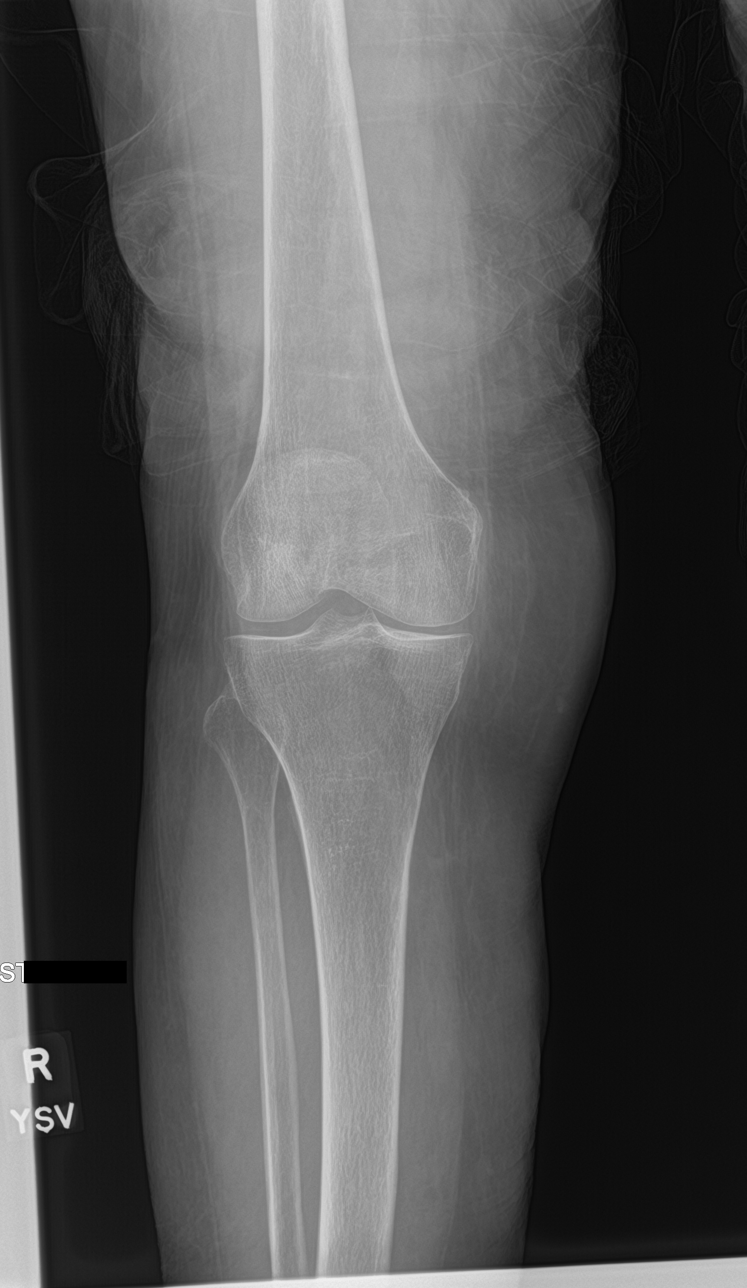

[tunnel]
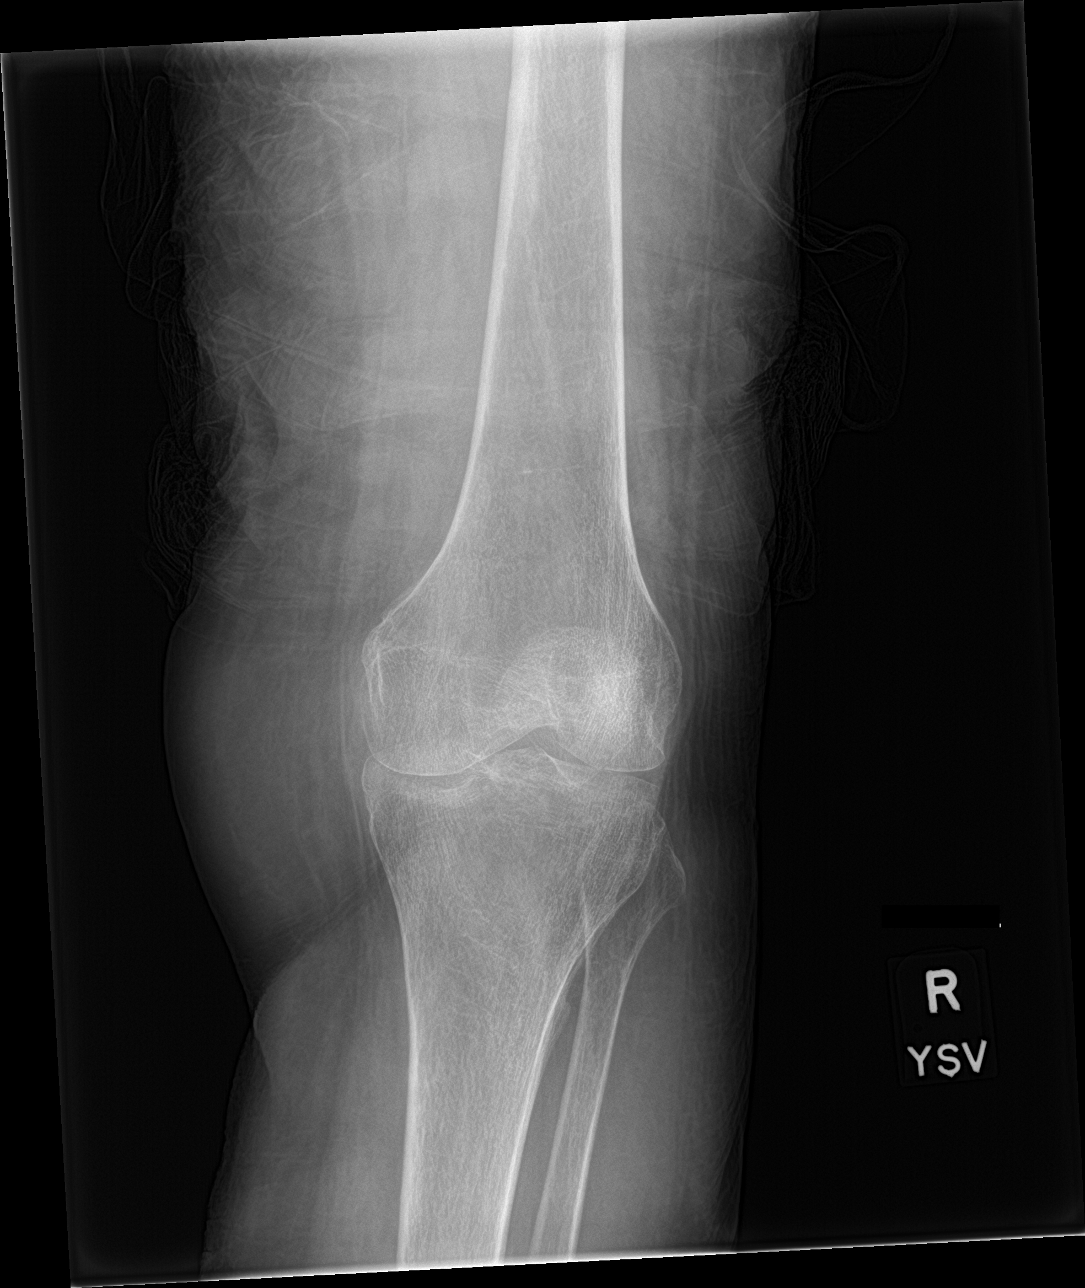

[knee lat]
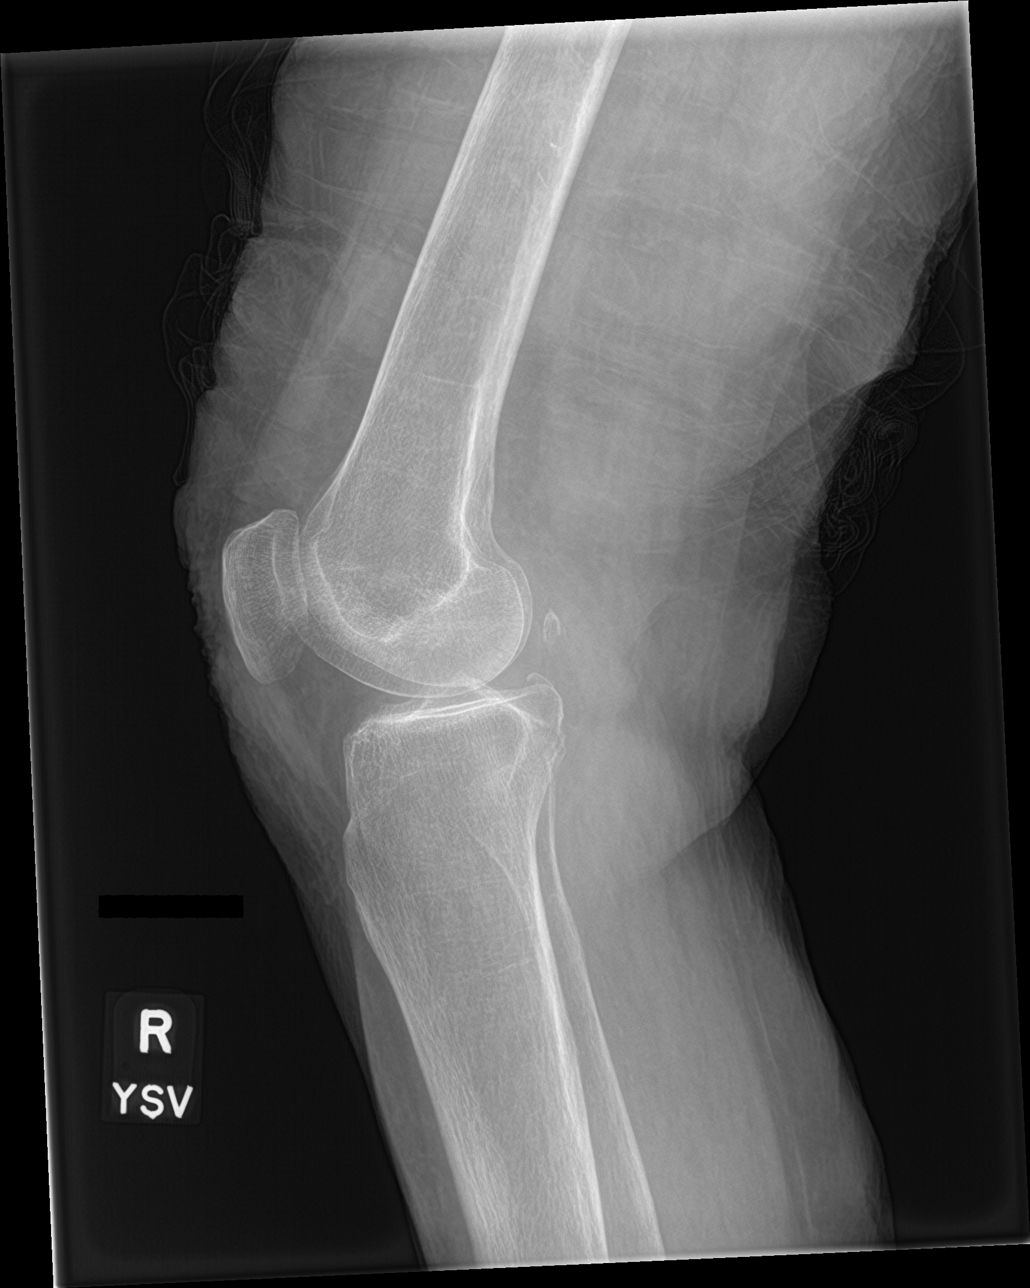

[knee sunrise]
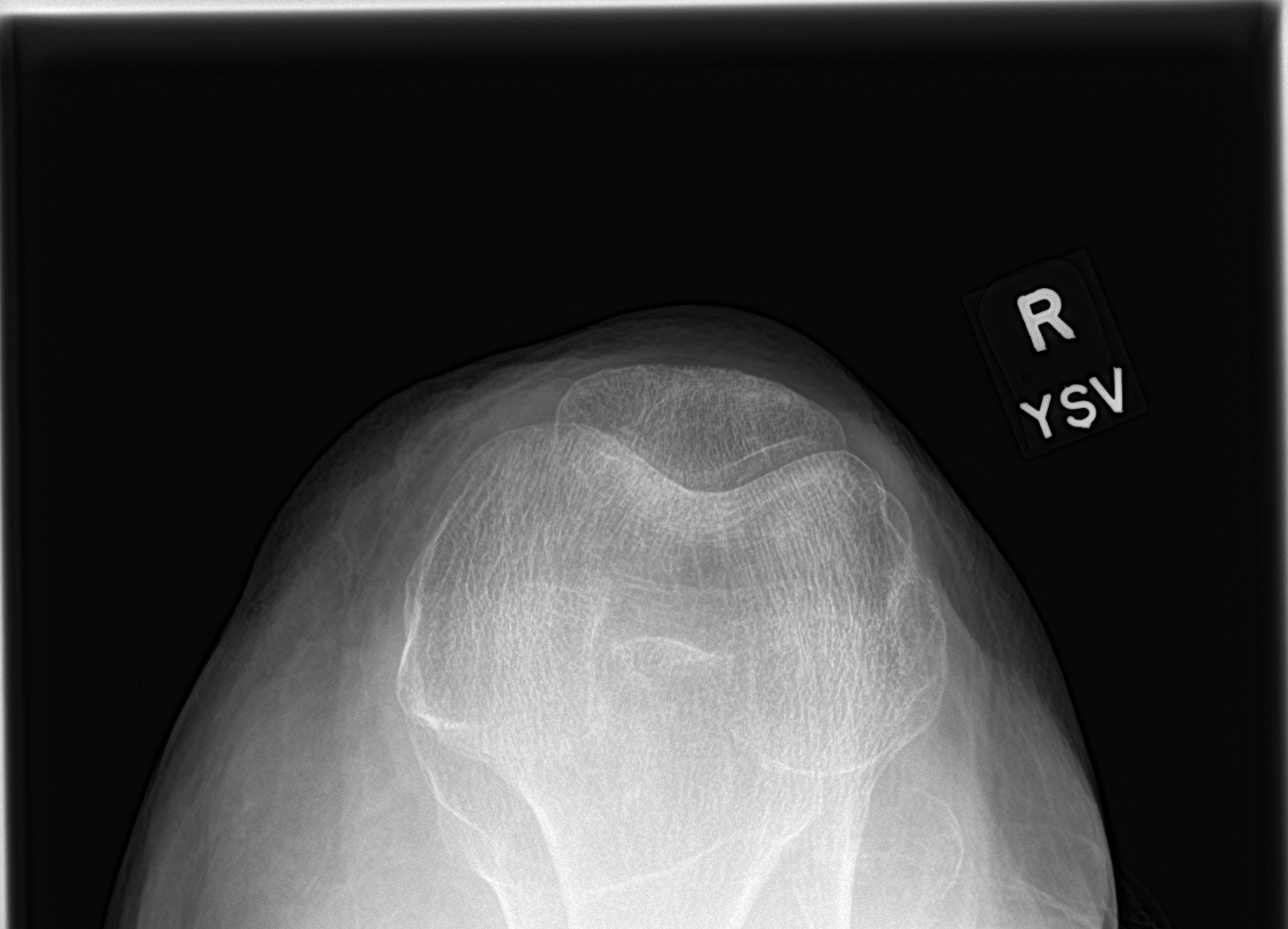

[4 of 4 positions shown; findings below may reference images not displayed]

FINDINGS: No evidence of fracture, dislocation, or joint effusion. No evidence
of arthropathy or other focal bone abnormality. Soft tissues are
unremarkable.
IMPRESSION: Negative.

## 2021-09-09 ENCOUNTER — Ambulatory Visit: Payer: Medicare Other | Admitting: Podiatry

## 2021-09-09 ENCOUNTER — Other Ambulatory Visit: Payer: Self-pay

## 2021-09-09 DIAGNOSIS — B351 Tinea unguium: Secondary | ICD-10-CM | POA: Diagnosis not present

## 2021-09-09 DIAGNOSIS — Z7901 Long term (current) use of anticoagulants: Secondary | ICD-10-CM

## 2021-09-09 DIAGNOSIS — M79674 Pain in right toe(s): Secondary | ICD-10-CM | POA: Diagnosis not present

## 2021-09-09 DIAGNOSIS — M79675 Pain in left toe(s): Secondary | ICD-10-CM

## 2021-09-10 NOTE — Progress Notes (Signed)
Subjective: 87 y.o. returns the office today for painful, elongated, thickened toenails which she cannot trim herself. Denies any redness or drainage around the nails.  She states she may get some more burning to her feet recently.  No other pain to her feet.  No recent injury.  Denies any open lesions.  PCP: Pincus Sanes, MD Last seen 08/14/2021  Objective: NAD- presents with caregiver DP/PT pulses palpable, CRT less than 3 seconds Nails hypertrophic, dystrophic, elongated, brittle, discolored 10. There is tenderness overlying the nails 1-5 on the left and 1-5 on the right. There is no surrounding erythema or drainage along the nail sites. Minimal hyperkeratotic tissue medial first MPJ.  No ulcerations identified No pain on the toe that she stubbed there is no swelling or redness or any open lesion. No open lesions or pre-ulcerative lesions are identified. No other areas of discomfort to bilateral lower extremities today.  No pain with calf compression, swelling, warmth, erythema.  Assessment: Patient presents with symptomatic onychomycosis  Plan: -Treatment options including alternatives, risks, complications were discussed -Nails sharply debrided x 10 without complication/bleeding. -Moisturizer daily to the calluses.  No significant callus or debried. -Discussed for the burning to her feet different topicals that she needs including Aspercreme lidocaine or capsaicin cream. -Discussed daily foot inspection. If there are any changes, to call the office immediately.  -Follow-up in 3 months or sooner if any problems are to arise. In the meantime, encouraged to call the office with any questions, concerns, changes symptoms.  Ovid Curd, DPM

## 2021-09-11 ENCOUNTER — Encounter: Payer: Medicare Other | Admitting: Physical Therapy

## 2021-09-16 ENCOUNTER — Ambulatory Visit (INDEPENDENT_AMBULATORY_CARE_PROVIDER_SITE_OTHER): Payer: Medicare Other | Admitting: Physical Therapy

## 2021-09-16 ENCOUNTER — Other Ambulatory Visit: Payer: Self-pay

## 2021-09-16 DIAGNOSIS — M79604 Pain in right leg: Secondary | ICD-10-CM | POA: Diagnosis not present

## 2021-09-16 DIAGNOSIS — R2689 Other abnormalities of gait and mobility: Secondary | ICD-10-CM | POA: Diagnosis not present

## 2021-09-16 DIAGNOSIS — M6281 Muscle weakness (generalized): Secondary | ICD-10-CM

## 2021-09-19 ENCOUNTER — Encounter: Payer: Self-pay | Admitting: Physical Therapy

## 2021-09-19 NOTE — Therapy (Addendum)
[facility] [address] [place], [place], [address] Phone: [phone]   Fax:  [phone]  Physical Therapy Treatment/Discharge   Patient Details  Name: Grace Alvarado MRN: [id number] Date of Birth: [birth year] Referring Provider (PT): Billey Gosling   Encounter Date: 09/16/2021   PT End of Session - 09/19/21 1354     Visit Number 6    Number of Visits 12    Date for PT Re-Evaluation 09/11/21    Authorization Type UHC    PT Start Time 1300    PT Stop Time 9833    PT Time Calculation (min) 43 min    Equipment Utilized During Treatment Gait belt    Activity Tolerance Patient limited by pain    Behavior During Therapy WFL for tasks assessed/performed             Past Medical History:  Diagnosis Date   Atrial fibrillation ([place])    Hyperlipidemia    Hypertension    Hypothyroidism    Mitral regurgitation    MVP (mitral valve prolapse)     Past Surgical History:  Procedure Laterality Date   APPENDECTOMY     c section     CESAREAN SECTION     CHOLECYSTECTOMY     COLONOSCOPY WITH PROPOFOL N/A 11/09/2018   Procedure: COLONOSCOPY WITH PROPOFOL;  Surgeon: Doran Stabler, MD;  Location: [place];  Service: Gastroenterology;  Laterality: N/A;   ESOPHAGOGASTRODUODENOSCOPY (EGD) WITH PROPOFOL N/A 11/09/2018   Procedure: ESOPHAGOGASTRODUODENOSCOPY (EGD) WITH PROPOFOL;  Surgeon: Doran Stabler, MD;  Location: [facility] [facility];  Service: Gastroenterology;  Laterality: N/A;   HOT HEMOSTASIS N/A 11/09/2018   Procedure: HOT HEMOSTASIS (ARGON PLASMA COAGULATION/BICAP);  Surgeon: Doran Stabler, MD;  Location: [place];  Service: Gastroenterology;  Laterality: N/A;   I & D EXTREMITY Left 03/02/2020   Procedure: evacuation of left knee hematoma, possible wound vac placement;  Surgeon: Meredith Pel, MD;  Location: [place];  Service: Orthopedics;  Laterality: Left;   SUBMUCOSAL INJECTION  11/09/2018   Procedure: SUBMUCOSAL  INJECTION;  Surgeon: Doran Stabler, MD;  Location: [place];  Service: Gastroenterology;;    There were no vitals filed for this visit.   Subjective Assessment - 09/19/21 1353     Subjective No new complaints. Pt and daughter report pt is doing well.    Currently in Pain? No/denies    Pain Score 0-No pain                               [facility] Adult PT Treatment/Exercise - 09/19/21 0001       Exercises   Exercises Lumbar      Lumbar Exercises: Stretches   Active Hamstring Stretch 30 seconds;Right;4 reps    Active Hamstring Stretch Limitations seated      Lumbar Exercises: Aerobic   Recumbent Bike L1 x 8 min;      Lumbar Exercises: Standing   Heel Raises 15 reps    Other Standing Lumbar Exercises March x 20, no UE support;  fwd, bwd, and A/P stepping with weight shifts x 15 ea; no UE support    Other Standing Lumbar Exercises Step ups x 5 bil, 1 UE support, 6 in,  Stairs, 5 steps, 1 UE support, recipricol x 5; toe taps on 6 in step x 20;      Lumbar Exercises: Seated   Long Arc Quad on Chair 10  reps;2 sets;Both    LAQ on Chair Weights (lbs) 2    Sit to Stand 10 reps   higher mat table, no use of UEs. Regular chair height, light use of UEs     Manual Therapy   Manual Therapy Soft tissue mobilization                     PT Education - 09/19/21 1354     Education Details Final HEP reviewed    Person(s) Educated Patient;Child(ren)    Methods Explanation;Demonstration;Verbal cues;Handout    Comprehension Verbalized understanding;Returned demonstration;Verbal cues required              PT Short Term Goals - 09/05/21 1236       PT SHORT TERM GOAL #1   Title Pt to be independent with initial HEP    Time 2    Period Weeks    Status Achieved    Target Date 08/14/21      PT SHORT TERM GOAL #2   Title Pt to report decreased pain in R leg to 0-4/10    Time 2    Period Weeks    Status Achieved    Target Date 08/14/21                PT Long Term Goals - 09/19/21 1357       PT LONG TERM GOAL #1   Title Pt to be independent with final HEP    Time 6    Period Weeks    Status Achieved    Target Date 09/11/21      PT LONG TERM GOAL #2   Title Pt to report decreased pain in R LE to 0-2/10 with activity,    Time 6    Period Weeks    Status Achieved    Target Date 09/11/21      PT LONG TERM GOAL #3   Title Pt to demo ability for sit to stand on 1st attempt with minimal UE support    Time 6    Period Weeks    Status Achieved    Target Date 09/11/21                   Plan - 09/19/21 1356     Clinical Impression Statement Pt doing very well at this time. She has improved mobility, and has met goals at this time. She has demonstrated increased LE strength, balance, and ability for safe functional activity, transfers, stairs, gait, and HEP. Recommended continued use of RW. Pt ready for d/c to HEP, discussed importance of continuing HEP. Pt and daughter in agreement with plan.    Personal Factors and Comorbidities Age    Examination-Activity Limitations Bed Mobility;Bend;Squat;Stairs;Stand;Lift;Locomotion Level;Transfers    Examination-Participation Restrictions Cleaning;Community Activity;Shop;Meal Prep    Stability/Clinical Decision Making Evolving/Moderate complexity    Rehab Potential Good    PT Frequency 2x / week    PT Duration 6 weeks    PT Treatment/Interventions ADLs/Self Care Home Management;Cryotherapy;Electrical Stimulation;Iontophoresis 2m/ml Dexamethasone;Moist Heat;Traction;Balance training;Therapeutic exercise;Therapeutic activities;Functional mobility training;Stair training;Gait training;DME Instruction;Neuromuscular re-education;Patient/family education;Orthotic Fit/Training;Manual techniques;Passive range of motion;Dry needling;Taping;Energy conservation;Spinal Manipulations;Joint Manipulations    PT Home Exercise Plan [id number]    Consulted and Agree with Plan of Care Patient              Patient will benefit from skilled therapeutic intervention in order to improve the following deficits and impairments:  Abnormal gait, Decreased endurance, Decreased activity tolerance, Pain, Decreased strength,  Decreased balance, Decreased mobility, Decreased range of motion, Impaired flexibility  Visit Diagnosis: Pain in right leg  Muscle weakness (generalized)  Other abnormalities of gait and mobility     Problem List Patient Active Problem List   Diagnosis Date Noted   Fatigue 08/14/2021   Urinary frequency 08/14/2021   Lumbar radiculopathy 06/25/2021   Rib pain on left side 09/27/2020   LUQ pain 09/17/2020   Pilonidal cyst 09/17/2020   Allergic rhinitis 07/03/2020   Pressure injury of sacral region, stage 1 07/03/2020   Aortic atherosclerosis ([place]) 06/13/2020   DNR (do not resuscitate) 03/07/2020   Prepatellar bursitis, left knee 03/02/2020   Neuropathy 01/24/2020   Chronic back pain 01/24/2020   Complaints of leg weakness 01/24/2020   Compression fracture of T11 vertebra ([place]) 01/23/2020   Leg pain 01/03/2020   Chronic anticoagulation 01/03/2020   Compression fracture of L1 lumbar vertebra ([place]) 07/27/2019   Low back pain 07/20/2019   Hyperglycemia 05/01/2019   Constipation 11/16/2018   AVM (arteriovenous malformation) of colon    Lower GI bleed 11/06/2018   Iron deficiency anemia 11/02/2018   Lymphedema of left lower extremity 10/22/2018   B12 deficiency 05/27/2018   Dependent edema 05/25/2018   Pulmonary hypertension, primary ([place]) 05/20/2018   Dyslipidemia 05/20/2018   CHF (congestive heart failure) ([place]) 05/20/2018   Tricuspid regurgitation 05/17/2018   Mitral regurgitation, mod-sev 2020 05/17/2018   Chronic a-fib 05/17/2018   Essential hypertension 05/17/2018   Hypothyroid 05/17/2018   CAD (coronary artery disease) 05/17/2018   Carotid atherosclerosis, bilateral 05/17/2018   Lyndee Hensen, PT, DPT 1:58 PM  09/19/21    [facility] [address] [place], [place], [address] Phone: [phone]   Fax:  [phone]  Name: Naja Apperson MRN: [id number] Date of Birth: 03-03-1925  PHYSICAL THERAPY DISCHARGE SUMMARY  Visits from Start of Care: 6 Plan: Patient agrees to discharge.  Patient goals were met. Patient is being discharged due to meeting the stated rehab goals.      Lyndee Hensen, PT, DPT 1:59 PM  09/19/21

## 2021-10-02 ENCOUNTER — Encounter: Payer: Medicare Other | Admitting: Physical Therapy

## 2021-10-03 ENCOUNTER — Other Ambulatory Visit: Payer: Self-pay | Admitting: Cardiology

## 2021-10-08 ENCOUNTER — Telehealth (INDEPENDENT_AMBULATORY_CARE_PROVIDER_SITE_OTHER): Payer: Medicare Other | Admitting: Family Medicine

## 2021-10-08 DIAGNOSIS — J029 Acute pharyngitis, unspecified: Secondary | ICD-10-CM

## 2021-10-08 DIAGNOSIS — R0981 Nasal congestion: Secondary | ICD-10-CM

## 2021-10-08 NOTE — Patient Instructions (Signed)
°  HOME CARE TIPS:  -COVID19 testing information:  Make sure to do at least 2 covid tests 48 hours apart as the covid test is often negative the first several days of sickness. GoldAgenda.is  Most pharmacies also offer testing and home test kits. If the Covid19 test is positive and you desire antiviral treatment, please contact a [facility] pharmacy or schedule a follow up virtual visit through your primary care office or through the [employer].  Other test to treat options: http://www.vasquez-vaughn.biz/?click_source=alert   -can use tylenol if needed for fevers, aches and pains per instructions  -gargle warm salt water twice daily  -can use nasal saline a few times per day if you have nasal congestion  -stay hydrated, drink plenty of fluids and eat small healthy meals - avoid dairy  -can take 1000 IU ( ) Vit D3 and 100-500 mg of Vit C daily per instructions  -If the Covid test is positive, check out the [facility] website for more information on home care, transmission and treatment for COVID19  -follow up with your doctor in 2-3 days unless improving and feeling better  -stay home while sick, except to seek medical care. If you have COVID19, you will likely be contagious for 7-10 days. Flu or Influenza is likely contagious for about 7 days. Other respiratory viral infections remain contagious for 5-10+ days depending on the virus and many other factors. Wear a good mask that fits snugly (such as N95 or KN95) if around others to reduce the risk of transmission.  It was nice to meet you today, and I really hope you are feeling better soon. I help  out with telemedicine visits on Tuesdays and Thursdays and am happy to help if you need a follow up virtual visit on those days. Otherwise, if you have any concerns or questions following this visit please schedule a follow up visit with your Primary Care doctor or seek care at a local urgent care  clinic to avoid delays in care.    Seek in person care or schedule a follow up video visit promptly if your symptoms worsen, new concerns arise or you are not improving with treatment. Call 911 and/or seek emergency care if your symptoms are severe or life threatening.

## 2021-10-08 NOTE — Progress Notes (Signed)
Virtual Visit via Video Note  I connected with Shalandria  on 10/08/21 at  1:20 PM EST by a video enabled telemedicine application and verified that I am speaking with the correct person using two identifiers.  Location patient: [place] Location provider:work or home office Persons participating in the virtual visit: patient, provider, patient's daughter  I discussed the limitations and requested verbal permission for telemedicine visit. The patient expressed understanding and agreed to proceed.   HPI:  Acute telemedicine visit for sore throat: -Onset: yesterday -Symptoms include: nasal congestion, sore throat -did a covid test today that was negative -Denies: fever, CP, SOB, NVD, body aches, malaise -Has tried:tylenol -Pertinent past medical history: see below -Pertinent medication allergies: Allergies  Allergen Reactions   Codeine Other (See Comments)    unknown   Penicillins Other (See Comments)    unknown   Vancomycin Other (See Comments)    unknown   Zithromax [Azithromycin] Other (See Comments)    unknown   Latex Rash   -COVID-19 vaccine status:  Immunization History  Administered Date(s) Administered   Fluad Quad(high Dose 65+) 06/07/2019, 06/25/2021   Influenza, High Dose Seasonal PF 06/30/2018, 06/08/2020   PFIZER(Purple Top)SARS-COV-2 Vaccination 10/25/2019, 11/14/2019   Pneumococcal Conjugate-13 06/30/2018   Pneumococcal Polysaccharide-23 03/05/2021   Tdap 02/22/2020     ROS: See pertinent positives and negatives per HPI.  Past Medical History:  Diagnosis Date   Atrial fibrillation (HCC)    Hyperlipidemia    Hypertension    Hypothyroidism    Mitral regurgitation    MVP (mitral valve prolapse)     Past Surgical History:  Procedure Laterality Date   APPENDECTOMY     c section     CESAREAN SECTION     CHOLECYSTECTOMY     COLONOSCOPY WITH PROPOFOL N/A 11/09/2018   Procedure: COLONOSCOPY WITH PROPOFOL;  Surgeon: Sherrilyn Rist, MD;  Location: [facility]  ENDOSCOPY;  Service: Gastroenterology;  Laterality: N/A;   ESOPHAGOGASTRODUODENOSCOPY (EGD) WITH PROPOFOL N/A 11/09/2018   Procedure: ESOPHAGOGASTRODUODENOSCOPY (EGD) WITH PROPOFOL;  Surgeon: Sherrilyn Rist, MD;  Location: [facility] ENDOSCOPY;  Service: Gastroenterology;  Laterality: N/A;   HOT HEMOSTASIS N/A 11/09/2018   Procedure: HOT HEMOSTASIS (ARGON PLASMA COAGULATION/BICAP);  Surgeon: Sherrilyn Rist, MD;  Location: [facility] ENDOSCOPY;  Service: Gastroenterology;  Laterality: N/A;   I & D EXTREMITY Left 03/02/2020   Procedure: evacuation of left knee hematoma, possible wound vac placement;  Surgeon: Cammy Copa, MD;  Location: [facility] OR;  Service: Orthopedics;  Laterality: Left;   SUBMUCOSAL INJECTION  11/09/2018   Procedure: SUBMUCOSAL INJECTION;  Surgeon: Sherrilyn Rist, MD;  Location: [facility] ENDOSCOPY;  Service: Gastroenterology;;     Current Outpatient Medications:    acetaminophen (TYLENOL) 325 MG tablet, Take 1-2 tablets (325-650 mg total) by mouth every 6 (six) hours as needed for mild pain (pain score 1-3 or temp > 100.5)., Disp:  , Rfl:    acidophilus (RISAQUAD) CAPS capsule, Take 1 capsule by mouth daily., Disp:  , Rfl:    acyclovir ointment (ZOVIRAX) 5 %, Apply 1 application topically 2 (two) times daily as needed., Disp: 5 g, Rfl: 0   B Complex-C (B-COMPLEX WITH VITAMIN C) tablet, Take 1 tablet by mouth daily., Disp:  , Rfl:    diltiazem (CARDIZEM SR) 120 MG 12 hr capsule, Take 1 capsule (120 mg total) by mouth 2 (two) times daily., Disp: 180 capsule, Rfl: 3   furosemide (LASIX) 20 MG tablet, TAKE 1 TABLET BY MOUTH EVERY OTHER  DAY, Disp: 45 tablet, Rfl: 3   KLOR-CON M10 10 MEQ tablet, TAKE 1 TABLET (10 MEQ TOTAL) BY MOUTH EVERY OTHER DAY., Disp: 45 tablet, Rfl: 3   levothyroxine (SYNTHROID) 75 MCG tablet, TAKE 1 TABLET BY MOUTH EVERY DAY BEFORE BREAKFAST, Disp: 90 tablet, Rfl: 1   polyethylene glycol (MIRALAX) 17 g packet, Take 17 g by mouth daily. Hold for loose bowel movements, Disp:  60 each, Rfl: 0   pravastatin (PRAVACHOL) 10 MG tablet, TAKE 1 TABLET BY MOUTH EVERY DAY, Disp: 90 tablet, Rfl: 3   Rivaroxaban (XARELTO) 15 MG TABS tablet, TAKE 1 TABLET BY MOUTH EVERY DAY WITH SUPPER, Disp: 90 tablet, Rfl: 1  EXAM:  VITALS per patient if applicable:  GENERAL: alert, oriented, appears well and in no acute distress  HEENT: atraumatic, conjunttiva clear, no obvious abnormalities on inspection of external nose and ears, could not visualize posterior oropharynx on exam, did have moist mucus membranes  NECK: normal movements of the head and neck  LUNGS: on inspection no signs of respiratory distress, breathing rate appears normal, no obvious gross SOB, gasping or wheezing  CV: no obvious cyanosis  MS: moves all visible extremities without noticeable abnormality  PSYCH/NEURO: pleasant and cooperative, no obvious depression or anxiety, speech and thought processing grossly intact  ASSESSMENT AND PLAN:  Discussed the following assessment and plan:  Sore throat  Nasal congestion  -we discussed possible serious and likely etiologies, options for evaluation and workup, limitations of telemedicine visit vs in person visit, treatment, treatment risks and precautions. Pt is agreeable to treatment via telemedicine at this moment. Query V[facility], possible covid but did have one negative test, vs other. She agrees to repeat covid testing and knows can contact  pharmacy if positive and desires antiviral. Otherwise opted for tylenol, salt water gargles and other symptomatic care measures per patient instructions.  Advised to seek prompt virtual visit or in person care if worsening, new symptoms arise, or if is not improving with treatment as expected per our conversation of expected course. Discussed options for follow up care. Did let this patient know that I do telemedicine on Tuesdays and Thursdays for [place] and those are the days I am logged into the system.   I discussed  the assessment and treatment plan with the patient. The patient was provided an opportunity to ask questions and all were answered. The patient agreed with the plan and demonstrated an understanding of the instructions.     Terressa Koyanagi, DO

## 2021-10-09 ENCOUNTER — Ambulatory Visit: Payer: Medicare Other

## 2021-10-10 ENCOUNTER — Ambulatory Visit (INDEPENDENT_AMBULATORY_CARE_PROVIDER_SITE_OTHER): Payer: Medicare Other

## 2021-10-10 ENCOUNTER — Other Ambulatory Visit: Payer: Self-pay

## 2021-10-10 DIAGNOSIS — E538 Deficiency of other specified B group vitamins: Secondary | ICD-10-CM

## 2021-10-10 MED ORDER — CYANOCOBALAMIN 1000 MCG/ML IJ SOLN
1000.0000 ug | Freq: Once | INTRAMUSCULAR | Status: AC
  Administered 2021-10-10: 1000 ug via INTRAMUSCULAR

## 2021-10-10 NOTE — Progress Notes (Signed)
Patient here for monthly B12 injection per Dr. Burns. B12 1000 mcg given in left IM and patient tolerated injection well today.  

## 2021-10-11 ENCOUNTER — Encounter: Payer: Self-pay | Admitting: Internal Medicine

## 2021-10-14 ENCOUNTER — Ambulatory Visit: Payer: Medicare Other | Admitting: Internal Medicine

## 2021-10-14 ENCOUNTER — Encounter: Payer: Self-pay | Admitting: Internal Medicine

## 2021-10-14 ENCOUNTER — Other Ambulatory Visit: Payer: Self-pay

## 2021-10-14 DIAGNOSIS — J069 Acute upper respiratory infection, unspecified: Secondary | ICD-10-CM | POA: Insufficient documentation

## 2021-10-14 DIAGNOSIS — R040 Epistaxis: Secondary | ICD-10-CM

## 2021-10-14 NOTE — Progress Notes (Signed)
Subjective:    Patient ID: Grace Alvarado, female    DOB: 1925/05/05, 88 y.o.   MRN: [id number]  This visit occurred during the SARS-CoV-2 public health emergency.  Safety protocols were in place, including screening questions prior to the visit, additional usage of staff PPE, and extensive cleaning of exam room while observing appropriate contact time as indicated for disinfecting solutions.    HPI The patient is here for an acute visit.   Symptoms started about one week ago.  She states a sore throat when everything first started, but that has resolved.  She states she think she has a mild amount of congestion, sneezing, dry cough that occurs in episodes that can be violent, occasional headaches and has been having some intermittent nosebleeds.  Her last nosebleed was yesterday morning.  She has been using saline nasal spray, which she does not particularly like using.     She has been having coughing fits -she had an episode this morning that made her cry it was so bad.      She has taken saline nasal spray, tylenol and cough drops.  Overall her symptoms are getting better.   Medications and allergies reviewed with patient and updated if appropriate.  Patient Active Problem List   Diagnosis Date Noted   Fatigue 08/14/2021   Urinary frequency 08/14/2021   Lumbar radiculopathy 06/25/2021   Rib pain on left side 09/27/2020   LUQ pain 09/17/2020   Pilonidal cyst 09/17/2020   Allergic rhinitis 07/03/2020   Pressure injury of sacral region, stage 1 07/03/2020   Aortic atherosclerosis (HCC) 06/13/2020   DNR (do not resuscitate) 03/07/2020   Prepatellar bursitis, left knee 03/02/2020   Neuropathy 01/24/2020   Chronic back pain 01/24/2020   Complaints of leg weakness 01/24/2020   Compression fracture of T11 vertebra (HCC) 01/23/2020   Leg pain 01/03/2020   Chronic anticoagulation 01/03/2020   Compression fracture of L1 lumbar vertebra (HCC) 07/27/2019   Low back pain  07/20/2019   Hyperglycemia 05/01/2019   Constipation 11/16/2018   AVM (arteriovenous malformation) of colon    Lower GI bleed 11/06/2018   Iron deficiency anemia 11/02/2018   Lymphedema of left lower extremity 10/22/2018   B12 deficiency 05/27/2018   Dependent edema 05/25/2018   Pulmonary hypertension, primary (HCC) 05/20/2018   Dyslipidemia 05/20/2018   CHF (congestive heart failure) (HCC) 05/20/2018   Tricuspid regurgitation 05/17/2018   Mitral regurgitation, mod-sev 2020 05/17/2018   Chronic a-fib 05/17/2018   Essential hypertension 05/17/2018   Hypothyroid 05/17/2018   CAD (coronary artery disease) 05/17/2018   Carotid atherosclerosis, bilateral 05/17/2018    Current Outpatient Medications on File Prior to Visit  Medication Sig Dispense Refill   acetaminophen (TYLENOL) 325 MG tablet Take 1-2 tablets (325-650 mg total) by mouth every 6 (six) hours as needed for mild pain (pain score 1-3 or temp > 100.5).     acidophilus (RISAQUAD) CAPS capsule Take 1 capsule by mouth daily.     acyclovir ointment (ZOVIRAX) 5 % Apply 1 application topically 2 (two) times daily as needed. 5 g 0   B Complex-C (B-COMPLEX WITH VITAMIN C) tablet Take 1 tablet by mouth daily.     diltiazem (CARDIZEM SR) 120 MG 12 hr capsule Take 1 capsule (120 mg total) by mouth 2 (two) times daily. 180 capsule 3   furosemide (LASIX) 20 MG tablet TAKE 1 TABLET BY MOUTH EVERY OTHER DAY 45 tablet 3   KLOR-CON M10 10 MEQ tablet TAKE 1 TABLET (10  MEQ TOTAL) BY MOUTH EVERY OTHER DAY. 45 tablet 3   levothyroxine (SYNTHROID) 75 MCG tablet TAKE 1 TABLET BY MOUTH EVERY DAY BEFORE BREAKFAST 90 tablet 1   polyethylene glycol (MIRALAX) 17 g packet Take 17 g by mouth daily. Hold for loose bowel movements 60 each 0   pravastatin (PRAVACHOL) 10 MG tablet TAKE 1 TABLET BY MOUTH EVERY DAY 90 tablet 3   Rivaroxaban (XARELTO) 15 MG TABS tablet TAKE 1 TABLET BY MOUTH EVERY DAY WITH SUPPER 90 tablet 1   No current facility-administered  medications on file prior to visit.    Past Medical History:  Diagnosis Date   Atrial fibrillation (HCC)    Hyperlipidemia    Hypertension    Hypothyroidism    Mitral regurgitation    MVP (mitral valve prolapse)     Past Surgical History:  Procedure Laterality Date   APPENDECTOMY     c section     CESAREAN SECTION     CHOLECYSTECTOMY     COLONOSCOPY WITH PROPOFOL N/A 11/09/2018   Procedure: COLONOSCOPY WITH PROPOFOL;  Surgeon: Sherrilyn Rist, MD;  Location: [facility] ENDOSCOPY;  Service: Gastroenterology;  Laterality: N/A;   ESOPHAGOGASTRODUODENOSCOPY (EGD) WITH PROPOFOL N/A 11/09/2018   Procedure: ESOPHAGOGASTRODUODENOSCOPY (EGD) WITH PROPOFOL;  Surgeon: Sherrilyn Rist, MD;  Location: [facility] ENDOSCOPY;  Service: Gastroenterology;  Laterality: N/A;   HOT HEMOSTASIS N/A 11/09/2018   Procedure: HOT HEMOSTASIS (ARGON PLASMA COAGULATION/BICAP);  Surgeon: Sherrilyn Rist, MD;  Location: [facility] ENDOSCOPY;  Service: Gastroenterology;  Laterality: N/A;   I & D EXTREMITY Left 03/02/2020   Procedure: evacuation of left knee hematoma, possible wound vac placement;  Surgeon: Cammy Copa, MD;  Location: [facility] OR;  Service: Orthopedics;  Laterality: Left;   SUBMUCOSAL INJECTION  11/09/2018   Procedure: SUBMUCOSAL INJECTION;  Surgeon: Sherrilyn Rist, MD;  Location: [facility] ENDOSCOPY;  Service: Gastroenterology;;    Social History   Socioeconomic History   Marital status: Widowed    Spouse name: Not on file   Number of children: 3   Years of education: Not on file   Highest education level: Not on file  Occupational History   Occupation: retired  Tobacco Use   Smoking status: Former   Smokeless tobacco: Never  Building services engineer Use: Never used  Substance and Sexual Activity   Alcohol use: Not Currently   Drug use: Never   Sexual activity: Not Currently  Other Topics Concern   Not on file  Social History Narrative   Not on file   Social Determinants of Health   Financial Resource  Strain: Not on file  Food Insecurity: Not on file  Transportation Needs: Not on file  Physical Activity: Not on file  Stress: Not on file  Social Connections: Not on file    Family History  Problem Relation Age of Onset   Cancer Mother    Dementia Mother     Review of Systems  Constitutional:  Negative for chills and fever.  HENT:  Positive for congestion, nosebleeds, sneezing and sore throat (when it started - resolved). Negative for ear pain, postnasal drip, sinus pressure and sinus pain.   Respiratory:  Positive for cough (dry). Negative for chest tightness, shortness of breath and wheezing.   Neurological:  Positive for headaches (occ). Negative for dizziness and light-headedness.      Objective:   Vitals:   10/14/21 1546  BP: 116/70  Pulse: 93  Temp: 98 F (36.7 C)  SpO2: 97%   BP Readings from Last 3 Encounters:  10/14/21 116/70  08/14/21 118/80  07/16/21 120/62   Wt Readings from Last 3 Encounters:  10/14/21 142 lb (64.4 kg)  08/14/21 137 lb (62.1 kg)  07/16/21 138 lb (62.6 kg)   Body mass index is 25.15 kg/m.   Physical Exam    GENERAL APPEARANCE: Appears stated age, well appearing, NAD EYES: conjunctiva clear, no icterus HENT: bilateral tympanic membranes and ear canals normal, oropharynx with no erythema or exudates, no active nosebleed, trachea midline, no cervical or supraclavicular lymphadenopathy LUNGS: Unlabored breathing, good air entry bilaterally, clear to auscultation without wheeze or crackles CARDIOVASCULAR: Normal S1,S2 , chronic bilateral 1+ nonpitting edema SKIN: Warm, dry      Assessment & Plan:    See Problem List for Assessment and Plan of chronic medical problems.

## 2021-10-14 NOTE — Patient Instructions (Addendum)
Your symptoms are likely viral in nature.  Take the cough drops as needed and continue the saline nasal spray.    Please call if there is no improvement in your symptoms or if you have any questions.      Nosebleed, Adult A nosebleed is when blood comes out of the nose. Nosebleeds are common. Usually, they are not a sign of a serious condition. Nosebleeds can happen if a blood vessel in your nose starts to bleed or if the lining of your nose (mucous membrane) cracks. They are commonly caused by: Allergies. Colds. Picking your nose. Blowing your nose too hard. An injury from sticking an object into your nose or getting hit in the nose. Dry or cold air. Less common causes of nosebleeds include: Toxic fumes. Something abnormal in the nose or in the air-filled spaces in the bones of the face (sinuses). Growths in the nose, such as polyps. Blood thinners or conditions that cause blood to clot slowly. Certain illnesses or procedures that irritate or dry out the nasal passages. Follow these instructions at home: When you have a nosebleed:  Sit down and tilt your head slightly forward. Use a clean towel or tissue to pinch your nostrils under the bony part of your nose. After 5 minutes, let go of your nose and see if bleeding starts again. Do not release pressure before that time. If there is still bleeding, repeat the pinching and holding for 5 minutes or until the bleeding stops. Do not place tissues or gauze in the nose to stop the bleeding. Avoid lying down and avoid tilting your head backward. That may make blood collect in the throat and cause gagging or coughing. Use a nasal spray decongestant to help with a nosebleed as told by your health care provider. After a nosebleed: Avoid blowing your nose or sniffing for a number of hours. Avoid straining, lifting, or bending at the waist for several days. You may go back to other normal activities as you are able. If you are taking  aspirin or blood thinners and you have nosebleeds, talk to your health care provider. These medicines make bleeding more likely. Ask your health care provider if you should stop taking the medicines or if you should adjust the dose. Do not stop taking medicines that your health care provider has recommended unless he or she tells you to stop taking them. If your nosebleed was caused by dry mucous membranes, use over-the-counter saline nasal spray or gel and a humidifier as told by your health care provider. This will keep the mucous membranes moist and allow them to heal. If you need to use one of these products: Choose one that is water-soluble. Use only as much as you need and use it only as often as needed. Do not lie down right after you use it. If you get nosebleeds often, talk with your health care provider about medical treatments. Options may include: Nasal cautery. This treatment stops and prevents nosebleeds by using a chemical swab or electrical device to lightly burn tiny blood vessels inside the nose. Nasal packing. A gauze or other material is placed in the nose to keep constant pressure on the bleeding area. Contact a health care provider if you: Have a fever. Get nosebleeds often or more often than usual. Bruise very easily. Have a nosebleed from having something stuck in your nose. Have bleeding in your mouth. Vomit or cough up brown material. Have a nosebleed after you start a new  medicine. Get help right away if: You have a nosebleed after a fall or a head injury. Your nosebleed does not go away after 20 minutes. You feel dizzy or weak. You have unusual bleeding from other parts of your body. You have unusual bruising on other parts of your body. You become sweaty. You vomit blood. Summary A nosebleed is when blood comes out of the nose. Common causes include allergies, an injury to the nose, or cold or dry air. Initial treatment includes applying pressure for 5  minutes. Moisturizing the nose with saline nasal spray or gel after a nosebleed may help prevent future bleeding. Get help right away if your nosebleed does not go away after 20 minutes. This information is not intended to replace advice given to you by your health care provider. Make sure you discuss any questions you have with your health care provider. Document Revised: 07/21/2019 Document Reviewed: 07/21/2019 Elsevier Patient Education  2022 [employer].

## 2021-10-14 NOTE — Assessment & Plan Note (Signed)
Acute Secondary to viral URI Last nosebleed was yesterday from the left nostril She is on Xarelto Continue saline nasal spray Can try humidifier in bedroom, Vaseline into the nostril or saline gel if needed

## 2021-10-14 NOTE — Assessment & Plan Note (Signed)
Acute Symptoms started 1 week ago and are consistent with a viral upper respiratory infection Current symptoms include sneezing, mild nasal congestion and dry cough occurring in episodes No antibiotic needed since this is viral in nature Discussed symptomatic treatment Can continue Tylenol, saline nasal spray and cough drops Can consider cough suppressant, but she does not feel that is necessary-this would only be for her comfort Would avoid anything that is going to dry her out too much as that may worsen her nosebleeds-antihistamine or certain cold medications over-the-counter

## 2021-10-17 ENCOUNTER — Other Ambulatory Visit: Payer: Self-pay | Admitting: Cardiology

## 2021-10-17 NOTE — Progress Notes (Signed)
b12 Injection given.   Lari Linson J Nguyen Butler, MD  

## 2021-10-27 ENCOUNTER — Encounter: Payer: Self-pay | Admitting: Internal Medicine

## 2021-10-27 MED ORDER — MOLNUPIRAVIR 200 MG PO CAPS: 4.0000 | ORAL_CAPSULE | Freq: Two times a day (BID) | ORAL | 0 refills | Status: DC

## 2021-10-27 MED ORDER — BENZONATATE 100 MG PO CAPS: 100.0000 mg | ORAL_CAPSULE | Freq: Two times a day (BID) | ORAL | 0 refills | Status: DC | PRN

## 2021-10-27 NOTE — Addendum Note (Signed)
Addended by: Pincus Sanes on: 10/27/2021 04:27 PM   Modules accepted: Orders

## 2021-10-28 ENCOUNTER — Telehealth: Payer: Self-pay | Admitting: Internal Medicine

## 2021-10-28 DIAGNOSIS — U071 COVID-19: Secondary | ICD-10-CM | POA: Insufficient documentation

## 2021-10-28 NOTE — Telephone Encounter (Signed)
Connected to [facility] 1.22.2023.  Caller states her mom tested positive for covid she has cold symptoms. would like Plaxlovid. Head pain. Symptoms began Thursday night. Day 3 of symptoms.  Caller is not with the patient. Declines giving me a number to reach her by. Advised that she is welcome to call me back when she is with her to discuss symptom's, but I would need to do a brief assessment over the phone to determine the severity of her symptoms before giving advisement.

## 2021-10-28 NOTE — Progress Notes (Signed)
Virtual Visit via Video Note  I connected with Grace Alvarado on 10/28/21 at  9:10 AM EST by a video enabled telemedicine application and verified that I am speaking with the correct person using two identifiers.   I discussed the limitations of evaluation and management by telemedicine and the availability of in person appointments. The patient expressed understanding and agreed to proceed.  Present for the visit:  Myself, Dr Cheryll Cockayne, Grace Alvarado and her daughter Grace Alvarado.  The patient is currently at home and I am in the office.    No referring provider.    History of Present Illness: This is an acute visit for covid  She tested positive for covid 4 days ago - today is day 5 of symptoms.  Her symptoms are mild-she has nasal congestion and a dry cough that is very mild.  She has had some headaches.  Other than that she denies any cold symptoms.  She is taking symptomatic treatment, which is helping.  Her daughter states that her mother does seem a little bit more forgetful.  1 point she had forgotten how to use the phone and another point she was putting something in a different place than he usually goes in.  No new weakness, numbness/tingling.  Appetite is normal and eating normally.  I did prescribe an antiviral over the weekend but they were concerned about whether she really needs it because they do not want her to have any side effects-dizziness we will diarrhea.  Review of Systems  Constitutional:  Negative for chills, fever and malaise/fatigue.       Appetite normal  HENT:  Positive for congestion. Negative for ear pain and sore throat.        No change in smell / taste  Respiratory:  Positive for cough (mild, dry). Negative for shortness of breath and wheezing.   Gastrointestinal:  Negative for diarrhea and nausea.  Musculoskeletal:  Negative for myalgias.  Neurological:  Positive for headaches.     Social History   Socioeconomic History   Marital status: Widowed     Spouse name: Not on file   Number of children: 3   Years of education: Not on file   Highest education level: Not on file  Occupational History   Occupation: retired  Tobacco Use   Smoking status: Former   Smokeless tobacco: Never  Building services engineer Use: Never used  Substance and Sexual Activity   Alcohol use: Not Currently   Drug use: Never   Sexual activity: Not Currently  Other Topics Concern   Not on file  Social History Narrative   Not on file   Social Determinants of Health   Financial Resource Strain: Not on file  Food Insecurity: Not on file  Transportation Needs: Not on file  Physical Activity: Not on file  Stress: Not on file  Social Connections: Not on file     Observations/Objective: Appears well in NAD Breathing normally Skin appears warm and dry Answering questions appropriately  Assessment and Plan:  See Problem List for Assessment and Plan of chronic medical problems.   Follow Up Instructions:    I discussed the assessment and treatment plan with the patient. The patient was provided an opportunity to ask questions and all were answered. The patient agreed with the plan and demonstrated an understanding of the instructions.   The patient was advised to call back or seek an in-person evaluation if the symptoms worsen or if the condition fails to improve  as anticipated.    Pincus Sanes, MD

## 2021-10-28 NOTE — Telephone Encounter (Signed)
Spoke with patient's daughter Angelique Blonder. She is hesitant about giving mom anti-viral and wants to discuss. Appointment was already made for tomorrow so I changed appointment to virtual visit.

## 2021-10-29 ENCOUNTER — Other Ambulatory Visit: Payer: Self-pay

## 2021-10-29 ENCOUNTER — Telehealth (INDEPENDENT_AMBULATORY_CARE_PROVIDER_SITE_OTHER): Payer: Medicare Other | Admitting: Internal Medicine

## 2021-10-29 ENCOUNTER — Encounter: Payer: Self-pay | Admitting: Internal Medicine

## 2021-10-29 DIAGNOSIS — R413 Other amnesia: Secondary | ICD-10-CM | POA: Diagnosis not present

## 2021-10-29 DIAGNOSIS — U071 COVID-19: Secondary | ICD-10-CM | POA: Diagnosis not present

## 2021-10-29 NOTE — Assessment & Plan Note (Signed)
Acute Family has noticed some mild memory changes recently, which could be related to recurrent infection No associated concerning symptoms to indicate stroke and she is on Xarelto For now we will monitor and evaluate further if needed

## 2021-10-29 NOTE — Assessment & Plan Note (Signed)
Acute Today is day 5 of symptoms She is currently doing symptomatic treatment with over-the-counter medications, Tessalon Perles Has not started the antiviral and I think it is not necessary-her and her family are concerned about possible side effects which are possible At this point since her symptoms are so mild would continue symptomatic treatment only If symptoms change or they have any concerns they will call immediately

## 2021-10-30 ENCOUNTER — Encounter: Payer: Self-pay | Admitting: Internal Medicine

## 2021-10-30 NOTE — Assessment & Plan Note (Addendum)
Acute H/o of LGIB - h/o telangiectasias in colon on 2020 colonoscopy - then cauterized On xarelto Ck cbc, iron panel Bleeding sounds minimal at this time - just monitor as long as cbc/iron looks ok Continue xarelto She will update me with her bleeding Avoid constipation/ straining

## 2021-10-30 NOTE — Progress Notes (Signed)
Subjective:    Patient ID: Grace Alvarado, female    DOB: 1924/12/25, 88 y.o.   MRN: [id number]  This visit occurred during the SARS-CoV-2 public health emergency.  Safety protocols were in place, including screening questions prior to the visit, additional usage of staff PPE, and extensive cleaning of exam room while observing appropriate contact time as indicated for disinfecting solutions.    HPI The patient is here for an acute visit.  Covid - recovering from covid - did not take antiviral.  Treated symptoms with tessalon perles, cough drops and otc meds.  She has had a dry cough.  This morning she started having a wet cough.  She has a lot of nasal congestion. Taking mucinex once a day.     Rectal bleeding - she noticed it yesterday when she wiped.  No blood in the stool.  Stool varies from soft to hard.  She had a little blood this morning with wiping.   No abdominal pain.    Medications and allergies reviewed with patient and updated if appropriate.  Patient Active Problem List   Diagnosis Date Noted   Memory changes 10/29/2021   COVID 10/28/2021   Viral URI with cough 10/14/2021   Left-sided nosebleed 10/14/2021   Fatigue 08/14/2021   Urinary frequency 08/14/2021   Lumbar radiculopathy 06/25/2021   Rib pain on left side 09/27/2020   LUQ pain 09/17/2020   Pilonidal cyst 09/17/2020   Allergic rhinitis 07/03/2020   Pressure injury of sacral region, stage 1 07/03/2020   Aortic atherosclerosis (HCC) 06/13/2020   DNR (do not resuscitate) 03/07/2020   Prepatellar bursitis, left knee 03/02/2020   Neuropathy 01/24/2020   Chronic back pain 01/24/2020   Complaints of leg weakness 01/24/2020   Compression fracture of T11 vertebra (HCC) 01/23/2020   Leg pain 01/03/2020   Chronic anticoagulation 01/03/2020   Compression fracture of L1 lumbar vertebra (HCC) 07/27/2019   Low back pain 07/20/2019   Hyperglycemia 05/01/2019   Constipation 11/16/2018   AVM (arteriovenous  malformation) of colon    Lower GI bleed 11/06/2018   Rectal bleeding 11/05/2018   Iron deficiency anemia 11/02/2018   Lymphedema of left lower extremity 10/22/2018   B12 deficiency 05/27/2018   Dependent edema 05/25/2018   Pulmonary hypertension, primary (HCC) 05/20/2018   Dyslipidemia 05/20/2018   CHF (congestive heart failure) (HCC) 05/20/2018   Tricuspid regurgitation 05/17/2018   Mitral regurgitation, mod-sev 2020 05/17/2018   Chronic a-fib 05/17/2018   Essential hypertension 05/17/2018   Hypothyroid 05/17/2018   CAD (coronary artery disease) 05/17/2018   Carotid atherosclerosis, bilateral 05/17/2018    Current Outpatient Medications on File Prior to Visit  Medication Sig Dispense Refill   acetaminophen (TYLENOL) 325 MG tablet Take 1-2 tablets (325-650 mg total) by mouth every 6 (six) hours as needed for mild pain (pain score 1-3 or temp > 100.5).     acidophilus (RISAQUAD) CAPS capsule Take 1 capsule by mouth daily.     acyclovir ointment (ZOVIRAX) 5 % Apply 1 application topically 2 (two) times daily as needed. 5 g 0   B Complex-C (B-COMPLEX WITH VITAMIN C) tablet Take 1 tablet by mouth daily.     benzonatate (TESSALON) 100 MG capsule Take 1 capsule (100 mg total) by mouth 2 (two) times daily as needed for cough. 20 capsule 0   diltiazem (CARDIZEM SR) 120 MG 12 hr capsule TAKE 1 CAPSULE BY MOUTH 2 TIMES DAILY. 180 capsule 3   furosemide (LASIX) 20 MG tablet TAKE 1  TABLET BY MOUTH EVERY OTHER DAY 45 tablet 3   KLOR-CON M10 10 MEQ tablet TAKE 1 TABLET (10 MEQ TOTAL) BY MOUTH EVERY OTHER DAY. 45 tablet 3   levothyroxine (SYNTHROID) 75 MCG tablet TAKE 1 TABLET BY MOUTH EVERY DAY BEFORE BREAKFAST 90 tablet 1   polyethylene glycol (MIRALAX) 17 g packet Take 17 g by mouth daily. Hold for loose bowel movements 60 each 0   pravastatin (PRAVACHOL) 10 MG tablet TAKE 1 TABLET BY MOUTH EVERY DAY 90 tablet 3   Rivaroxaban (XARELTO) 15 MG TABS tablet TAKE 1 TABLET BY MOUTH EVERY DAY WITH  SUPPER 90 tablet 1   No current facility-administered medications on file prior to visit.    Past Medical History:  Diagnosis Date   Atrial fibrillation (HCC)    Hyperlipidemia    Hypertension    Hypothyroidism    Mitral regurgitation    MVP (mitral valve prolapse)     Past Surgical History:  Procedure Laterality Date   APPENDECTOMY     c section     CESAREAN SECTION     CHOLECYSTECTOMY     COLONOSCOPY WITH PROPOFOL N/A 11/09/2018   Procedure: COLONOSCOPY WITH PROPOFOL;  Surgeon: Sherrilyn Rist, MD;  Location: [facility] ENDOSCOPY;  Service: Gastroenterology;  Laterality: N/A;   ESOPHAGOGASTRODUODENOSCOPY (EGD) WITH PROPOFOL N/A 11/09/2018   Procedure: ESOPHAGOGASTRODUODENOSCOPY (EGD) WITH PROPOFOL;  Surgeon: Sherrilyn Rist, MD;  Location: [facility] ENDOSCOPY;  Service: Gastroenterology;  Laterality: N/A;   HOT HEMOSTASIS N/A 11/09/2018   Procedure: HOT HEMOSTASIS (ARGON PLASMA COAGULATION/BICAP);  Surgeon: Sherrilyn Rist, MD;  Location: [facility] ENDOSCOPY;  Service: Gastroenterology;  Laterality: N/A;   I & D EXTREMITY Left 03/02/2020   Procedure: evacuation of left knee hematoma, possible wound vac placement;  Surgeon: Cammy Copa, MD;  Location: [facility] OR;  Service: Orthopedics;  Laterality: Left;   SUBMUCOSAL INJECTION  11/09/2018   Procedure: SUBMUCOSAL INJECTION;  Surgeon: Sherrilyn Rist, MD;  Location: [facility] ENDOSCOPY;  Service: Gastroenterology;;    Social History   Socioeconomic History   Marital status: Widowed    Spouse name: Not on file   Number of children: 3   Years of education: Not on file   Highest education level: Not on file  Occupational History   Occupation: retired  Tobacco Use   Smoking status: Former   Smokeless tobacco: Never  Building services engineer Use: Never used  Substance and Sexual Activity   Alcohol use: Not Currently   Drug use: Never   Sexual activity: Not Currently  Other Topics Concern   Not on file  Social History Narrative   Not on file    Social Determinants of Health   Financial Resource Strain: Not on file  Food Insecurity: Not on file  Transportation Needs: Not on file  Physical Activity: Not on file  Stress: Not on file  Social Connections: Not on file    Family History  Problem Relation Age of Onset   Cancer Mother    Dementia Mother     Review of Systems  Constitutional:  Negative for appetite change, chills and fever.  HENT:  Positive for congestion (worse). Negative for sinus pressure, sinus pain and sore throat.   Respiratory:  Positive for cough (was dry - now wet). Negative for chest tightness, shortness of breath and wheezing.   Gastrointestinal:  Positive for anal bleeding. Negative for abdominal pain, blood in stool and nausea.  Neurological:  Positive for light-headedness. Negative  for headaches.      Objective:   Vitals:   10/31/21 0927  BP: 118/74  Pulse: 77  Temp: 97.8 F (36.6 C)  SpO2: 97%   BP Readings from Last 3 Encounters:  10/31/21 118/74  10/14/21 116/70  08/14/21 118/80   Wt Readings from Last 3 Encounters:  10/31/21 136 lb (61.7 kg)  10/14/21 142 lb (64.4 kg)  08/14/21 137 lb (62.1 kg)   Body mass index is 24.09 kg/m.   Physical Exam Constitutional:      General: She is not in acute distress.    Appearance: Normal appearance. She is not ill-appearing.  HENT:     Head: Normocephalic and atraumatic.     Nose: Congestion present.  Cardiovascular:     Rate and Rhythm: Normal rate. Rhythm irregular.     Heart sounds: Murmur heard.  Pulmonary:     Effort: Pulmonary effort is normal. No respiratory distress.     Breath sounds: No wheezing or rales.  Abdominal:     General: There is no distension.     Palpations: Abdomen is soft.     Tenderness: There is no abdominal tenderness.  Musculoskeletal:     Right lower leg: Edema (mild) present.     Left lower leg: Edema (mild) present.  Neurological:     Mental Status: She is alert.           Assessment &  Plan:    See Problem List for Assessment and Plan of chronic medical problems.

## 2021-10-31 ENCOUNTER — Encounter: Payer: Self-pay | Admitting: Internal Medicine

## 2021-10-31 ENCOUNTER — Other Ambulatory Visit: Payer: Self-pay

## 2021-10-31 ENCOUNTER — Ambulatory Visit: Payer: Medicare Other | Admitting: Internal Medicine

## 2021-10-31 DIAGNOSIS — K625 Hemorrhage of anus and rectum: Secondary | ICD-10-CM

## 2021-10-31 DIAGNOSIS — U071 COVID-19: Secondary | ICD-10-CM | POA: Diagnosis not present

## 2021-10-31 LAB — CBC WITH DIFFERENTIAL/PLATELET
Basophils Absolute: 0 10*3/uL (ref 0.0–0.1)
Basophils Relative: 0.7 % (ref 0.0–3.0)
Eosinophils Absolute: 0 10*3/uL (ref 0.0–0.7)
Eosinophils Relative: 0.5 % (ref 0.0–5.0)
HCT: 33.5 % — ABNORMAL LOW (ref 36.0–46.0)
Hemoglobin: 11.2 g/dL — ABNORMAL LOW (ref 12.0–15.0)
Lymphocytes Relative: 31.8 % (ref 12.0–46.0)
Lymphs Abs: 1 10*3/uL (ref 0.7–4.0)
MCHC: 33.3 g/dL (ref 30.0–36.0)
MCV: 93.1 fl (ref 78.0–100.0)
Monocytes Absolute: 0.4 10*3/uL (ref 0.1–1.0)
Monocytes Relative: 11.9 % (ref 3.0–12.0)
Neutro Abs: 1.6 10*3/uL (ref 1.4–7.7)
Neutrophils Relative %: 55.1 % (ref 43.0–77.0)
Platelets: 104 10*3/uL — ABNORMAL LOW (ref 150.0–400.0)
RBC: 3.61 Mil/uL — ABNORMAL LOW (ref 3.87–5.11)
RDW: 15.6 % — ABNORMAL HIGH (ref 11.5–15.5)
WBC: 3 10*3/uL — ABNORMAL LOW (ref 4.0–10.5)

## 2021-10-31 LAB — IBC PANEL
Iron: 58 ug/dL (ref 42–145)
Saturation Ratios: 17 % — ABNORMAL LOW (ref 20.0–50.0)
TIBC: 340.2 ug/dL (ref 250.0–450.0)
Transferrin: 243 mg/dL (ref 212.0–360.0)

## 2021-10-31 NOTE — Assessment & Plan Note (Signed)
Acute Today is day 7 of symptoms Has nasal congestion and cough today turned wet - likely from PND No need for an abx, lung clear - feels ok Continue mucinex, cough drops, saline nasal spray She will update me if symptoms are not improving or worsening

## 2021-10-31 NOTE — Patient Instructions (Addendum)
° ° ° °  Blood work was ordered.     Medications changes include :   none    Please call if there is no improvement in your symptoms.

## 2021-11-11 ENCOUNTER — Encounter: Payer: Self-pay | Admitting: Internal Medicine

## 2021-11-14 ENCOUNTER — Other Ambulatory Visit: Payer: Self-pay

## 2021-11-14 ENCOUNTER — Ambulatory Visit (INDEPENDENT_AMBULATORY_CARE_PROVIDER_SITE_OTHER): Payer: Medicare Other

## 2021-11-14 DIAGNOSIS — E538 Deficiency of other specified B group vitamins: Secondary | ICD-10-CM

## 2021-11-14 MED ORDER — CYANOCOBALAMIN 1000 MCG/ML IJ SOLN
1000.0000 ug | Freq: Once | INTRAMUSCULAR | Status: AC
  Administered 2021-11-14: 1000 ug via INTRAMUSCULAR

## 2021-11-14 NOTE — Progress Notes (Signed)
Patient here for monthly B12 injection per Dr. Burns. B12 1000 mcg given in left IM and patient tolerated injection well today.  

## 2021-11-18 NOTE — Progress Notes (Signed)
HPI: FU atrial fibrillation. Patient previously resided in [place].  Patient with history of permanent atrial fibrillation.  Previously failed amiodarone and multaq per records. Nuclear study 2012 showed no ischemia with normal LV function. Patient made it clear previously that she did not want valve surgery. Echocardiogram January 2020 showed normal LV function, moderate biatrial enlargement, moderate to severe mitral regurgitation secondary to posterior prolapse.  ABIs April 2021 normal.  Since last seen she denies dyspnea, chest pain, palpitations, syncope or bleeding.  Current Outpatient Medications  Medication Sig Dispense Refill   acetaminophen (TYLENOL) 325 MG tablet Take 1-2 tablets (325-650 mg total) by mouth every 6 (six) hours as needed for mild pain (pain score 1-3 or temp > 100.5).     B Complex-C (B-COMPLEX WITH VITAMIN C) tablet Take 1 tablet by mouth daily.     diltiazem (CARDIZEM SR) 120 MG 12 hr capsule TAKE 1 CAPSULE BY MOUTH 2 TIMES DAILY. 180 capsule 3   furosemide (LASIX) 20 MG tablet TAKE 1 TABLET BY MOUTH EVERY OTHER DAY 45 tablet 3   KLOR-CON M10 10 MEQ tablet TAKE 1 TABLET (10 MEQ TOTAL) BY MOUTH EVERY OTHER DAY. 45 tablet 3   levothyroxine (SYNTHROID) 75 MCG tablet TAKE 1 TABLET BY MOUTH EVERY DAY BEFORE BREAKFAST 90 tablet 1   polyethylene glycol (MIRALAX) 17 g packet Take 17 g by mouth daily. Hold for loose bowel movements 60 each 0   pravastatin (PRAVACHOL) 10 MG tablet TAKE 1 TABLET BY MOUTH EVERY DAY 90 tablet 3   Rivaroxaban (XARELTO) 15 MG TABS tablet TAKE 1 TABLET BY MOUTH EVERY DAY WITH SUPPER 90 tablet 1   acidophilus (RISAQUAD) CAPS capsule Take 1 capsule by mouth daily. (Patient not taking: Reported on 11/28/2021)     acyclovir ointment (ZOVIRAX) 5 % Apply 1 application topically 2 (two) times daily as needed. (Patient not taking: Reported on 11/28/2021) 5 g 0   No current facility-administered medications for this visit.     Past Medical History:   Diagnosis Date   Atrial fibrillation (HCC)    Hyperlipidemia    Hypertension    Hypothyroidism    Mitral regurgitation    MVP (mitral valve prolapse)     Past Surgical History:  Procedure Laterality Date   APPENDECTOMY     c section     CESAREAN SECTION     CHOLECYSTECTOMY     COLONOSCOPY WITH PROPOFOL N/A 11/09/2018   Procedure: COLONOSCOPY WITH PROPOFOL;  Surgeon: Doran Stabler, MD;  Location: [place];  Service: Gastroenterology;  Laterality: N/A;   ESOPHAGOGASTRODUODENOSCOPY (EGD) WITH PROPOFOL N/A 11/09/2018   Procedure: ESOPHAGOGASTRODUODENOSCOPY (EGD) WITH PROPOFOL;  Surgeon: Doran Stabler, MD;  Location: [place];  Service: Gastroenterology;  Laterality: N/A;   HOT HEMOSTASIS N/A 11/09/2018   Procedure: HOT HEMOSTASIS (ARGON PLASMA COAGULATION/BICAP);  Surgeon: Doran Stabler, MD;  Location: [place];  Service: Gastroenterology;  Laterality: N/A;   I & D EXTREMITY Left 03/02/2020   Procedure: evacuation of left knee hematoma, possible wound vac placement;  Surgeon: Meredith Pel, MD;  Location: [place];  Service: Orthopedics;  Laterality: Left;   SUBMUCOSAL INJECTION  11/09/2018   Procedure: SUBMUCOSAL INJECTION;  Surgeon: Doran Stabler, MD;  Location: [facility] ENDOSCOPY;  Service: Gastroenterology;;    Social History   Socioeconomic History   Marital status: Widowed    Spouse name: Not on file   Number of children: 3   Years of education:  Not on file   Highest education level: Not on file  Occupational History   Occupation: retired  Tobacco Use   Smoking status: Former   Smokeless tobacco: Never  Scientific laboratory technician Use: Never used  Substance and Sexual Activity   Alcohol use: Not Currently   Drug use: Never   Sexual activity: Not Currently  Other Topics Concern   Not on file  Social History Narrative   Not on file   Social Determinants of Health   Financial Resource Strain: Not on file  Food Insecurity: Not on file  Transportation  Needs: Not on file  Physical Activity: Not on file  Stress: Not on file  Social Connections: Not on file  Intimate Partner Violence: Not on file    Family History  Problem Relation Age of Onset   Cancer Mother    Dementia Mother     ROS: no fevers or chills, productive cough, hemoptysis, dysphasia, odynophagia, melena, hematochezia, dysuria, hematuria, rash, seizure activity, orthopnea, PND, pedal edema, claudication. Remaining systems are negative.  Physical Exam: Well-developed well-nourished in no acute distress.  Skin is warm and dry.  HEENT is normal.  Neck is supple.  Chest is clear to auscultation with normal expansion.  Cardiovascular exam is irregular Abdominal exam nontender or distended. No masses palpated. Extremities show no edema. neuro grossly intact  ECG-atrial fibrillation at a rate of 93, nonspecific ST changes, low voltage.  Personally reviewed  A/P  1 permanent atrial fibrillation-continue cardizem and xarelto.  Check hemoglobin and renal function.   2 hypertension-blood pressure controlled.  Continue present medical regimen and follow.    3 hyperlipidemia-continue statin.   4 mitral valve prolapse with severe mitral regurgitation-given age she does not want to consider surgical procedures.  I discussed repeating echocardiogram to make sure LV function remains normal but she would prefer to be conservative at this point with no follow-up imaging.   5 lower extremity edema-controlled; continue present dose of diuretic and follow.   Kirk Ruths, MD

## 2021-11-23 ENCOUNTER — Encounter: Payer: Self-pay | Admitting: Internal Medicine

## 2021-11-28 ENCOUNTER — Ambulatory Visit: Payer: Medicare Other | Admitting: Cardiology

## 2021-11-28 ENCOUNTER — Encounter: Payer: Self-pay | Admitting: Cardiology

## 2021-11-28 ENCOUNTER — Other Ambulatory Visit: Payer: Self-pay

## 2021-11-28 VITALS — BP 128/70 | HR 93 | Ht 65.5 in | Wt 141.8 lb

## 2021-11-28 DIAGNOSIS — I340 Nonrheumatic mitral (valve) insufficiency: Secondary | ICD-10-CM

## 2021-11-28 DIAGNOSIS — I10 Essential (primary) hypertension: Secondary | ICD-10-CM | POA: Diagnosis not present

## 2021-11-28 DIAGNOSIS — I482 Chronic atrial fibrillation, unspecified: Secondary | ICD-10-CM

## 2021-11-28 NOTE — Patient Instructions (Signed)

## 2021-11-29 ENCOUNTER — Encounter: Payer: Self-pay | Admitting: Internal Medicine

## 2021-11-29 ENCOUNTER — Other Ambulatory Visit: Payer: Self-pay

## 2021-11-29 ENCOUNTER — Encounter: Payer: Self-pay | Admitting: Cardiology

## 2021-11-29 DIAGNOSIS — I340 Nonrheumatic mitral (valve) insufficiency: Secondary | ICD-10-CM

## 2021-11-29 LAB — CBC
Hematocrit: 32.8 % — ABNORMAL LOW (ref 34.0–46.6)
Hemoglobin: 10.6 g/dL — ABNORMAL LOW (ref 11.1–15.9)
MCH: 30.6 pg (ref 26.6–33.0)
MCHC: 32.3 g/dL (ref 31.5–35.7)
MCV: 95 fL (ref 79–97)
Platelets: 140 10*3/uL — ABNORMAL LOW (ref 150–450)
RBC: 3.46 x10E6/uL — ABNORMAL LOW (ref 3.77–5.28)
RDW: 13.2 % (ref 11.7–15.4)
WBC: 4.7 10*3/uL (ref 3.4–10.8)

## 2021-11-29 LAB — BASIC METABOLIC PANEL
BUN/Creatinine Ratio: 15 (ref 12–28)
BUN: 10 mg/dL (ref 10–36)
CO2: 26 mmol/L (ref 20–29)
Calcium: 9.6 mg/dL (ref 8.7–10.3)
Chloride: 104 mmol/L (ref 96–106)
Creatinine, Ser: 0.66 mg/dL (ref 0.57–1.00)
Glucose: 91 mg/dL (ref 70–99)
Potassium: 4.4 mmol/L (ref 3.5–5.2)
Sodium: 145 mmol/L — ABNORMAL HIGH (ref 134–144)
eGFR: 81 mL/min/{1.73_m2} (ref >59)

## 2021-11-29 NOTE — Telephone Encounter (Signed)
Spoke with daughter Angelique Blonder who is requesting an echocardiogram (she changed her mind since yesterday's visit). Should I place the order?

## 2021-12-01 ENCOUNTER — Other Ambulatory Visit: Payer: Self-pay | Admitting: Internal Medicine

## 2021-12-01 DIAGNOSIS — D509 Iron deficiency anemia, unspecified: Secondary | ICD-10-CM

## 2021-12-05 ENCOUNTER — Other Ambulatory Visit: Payer: Self-pay

## 2021-12-05 ENCOUNTER — Ambulatory Visit (HOSPITAL_COMMUNITY): Payer: Medicare Other | Attending: Cardiology

## 2021-12-05 DIAGNOSIS — I340 Nonrheumatic mitral (valve) insufficiency: Secondary | ICD-10-CM | POA: Insufficient documentation

## 2021-12-05 LAB — ECHOCARDIOGRAM COMPLETE
Area-P 1/2: 5.42 cm2
MV M vel: 5.53 m/s
MV Peak grad: 122.2 mmHg
Radius: 0.9 cm
S' Lateral: 2.7 cm

## 2021-12-09 ENCOUNTER — Other Ambulatory Visit: Payer: Self-pay

## 2021-12-09 ENCOUNTER — Ambulatory Visit: Payer: Medicare Other | Admitting: Podiatry

## 2021-12-09 DIAGNOSIS — L84 Corns and callosities: Secondary | ICD-10-CM

## 2021-12-09 DIAGNOSIS — M79674 Pain in right toe(s): Secondary | ICD-10-CM | POA: Diagnosis not present

## 2021-12-09 DIAGNOSIS — Z7901 Long term (current) use of anticoagulants: Secondary | ICD-10-CM

## 2021-12-09 DIAGNOSIS — B351 Tinea unguium: Secondary | ICD-10-CM | POA: Diagnosis not present

## 2021-12-09 DIAGNOSIS — M79675 Pain in left toe(s): Secondary | ICD-10-CM

## 2021-12-12 ENCOUNTER — Other Ambulatory Visit: Payer: Self-pay

## 2021-12-12 ENCOUNTER — Ambulatory Visit (INDEPENDENT_AMBULATORY_CARE_PROVIDER_SITE_OTHER): Payer: Medicare Other

## 2021-12-12 DIAGNOSIS — E538 Deficiency of other specified B group vitamins: Secondary | ICD-10-CM

## 2021-12-12 MED ORDER — CYANOCOBALAMIN 1000 MCG/ML IJ SOLN
1000.0000 ug | Freq: Once | INTRAMUSCULAR | Status: AC
  Administered 2021-12-12: 15:00:00 1000 ug via INTRAMUSCULAR

## 2021-12-12 NOTE — Progress Notes (Signed)
Subjective: ?88 y.o. returns the office today for painful, elongated, thickened toenails which she cannot trim herself and for calluses. Denies any redness or drainage around the nails/calluses.  No other pain to her feet.  No recent injury.  Denies any open lesions. ? ?She is on Xarelto ? ?PCP: Pincus Sanes, MD ?Last seen 10/31/2021 ? ?Objective: ?NAD- presents with caregiver ?DP/PT pulses palpable, CRT less than 3 seconds ?Nails hypertrophic, dystrophic, elongated, brittle, discolored ?10. There is tenderness overlying the nails 1-5 on the left and 1-5 on the right. There is no surrounding erythema or drainage along the nail sites. ?Minimal hyperkeratotic tissue medial first MPJ.  No ulcerations identified ?No pain on the toe that she stubbed there is no swelling or redness or any open lesion. ?No open lesions or pre-ulcerative lesions are identified. ?No other areas of discomfort to bilateral lower extremities today.  ?No pain with calf compression, swelling, warmth, erythema. ? ?Assessment: ?Patient presents with symptomatic onychomycosis ? ?Plan: ?-Treatment options including alternatives, risks, complications were discussed ?-Nails sharply debrided x 10 without complication/bleeding. ?-Moisturizer daily to the calluses.  Debrided hyperkeratotic lesion left hallux.  Complications or bleeding. ?-Discussed for the burning to her feet different topicals that she needs including Aspercreme lidocaine or capsaicin cream. ?-Discussed daily foot inspection. If there are any changes, to call the office immediately.  ?-Follow-up in 3 months or sooner if any problems are to arise. In the meantime, encouraged to call the office with any questions, concerns, changes symptoms. ? ?Ovid Curd, DPM ? ?

## 2021-12-12 NOTE — Progress Notes (Signed)
Patient here for monthly B12 injection per Dr. Burns. B12 1000 mcg given in right IM and patient tolerated injection well today.  

## 2021-12-16 ENCOUNTER — Encounter: Payer: Self-pay | Admitting: Internal Medicine

## 2021-12-16 NOTE — Progress Notes (Unsigned)
° ° °  Subjective:    Patient ID: Grace Alvarado, female    DOB: Feb 28, 1925, 88 y.o.   MRN: [id number]  This visit occurred during the SARS-CoV-2 public health emergency.  Safety protocols were in place, including screening questions prior to the visit, additional usage of staff PPE, and extensive cleaning of exam room while observing appropriate contact time as indicated for disinfecting solutions.    HPI Vivion is here for No chief complaint on file.   Weight gain, swelling and redness in leg -      Medications and allergies reviewed with patient and updated if appropriate.  Current Outpatient Medications on File Prior to Visit  Medication Sig Dispense Refill   acetaminophen (TYLENOL) 325 MG tablet Take 1-2 tablets (325-650 mg total) by mouth every 6 (six) hours as needed for mild pain (pain score 1-3 or temp > 100.5).     B Complex-C (B-COMPLEX WITH VITAMIN C) tablet Take 1 tablet by mouth daily.     diltiazem (CARDIZEM SR) 120 MG 12 hr capsule TAKE 1 CAPSULE BY MOUTH 2 TIMES DAILY. 180 capsule 3   furosemide (LASIX) 20 MG tablet TAKE 1 TABLET BY MOUTH EVERY OTHER DAY 45 tablet 3   KLOR-CON M10 10 MEQ tablet TAKE 1 TABLET (10 MEQ TOTAL) BY MOUTH EVERY OTHER DAY. 45 tablet 3   levothyroxine (SYNTHROID) 75 MCG tablet TAKE 1 TABLET BY MOUTH EVERY DAY BEFORE BREAKFAST 90 tablet 1   polyethylene glycol (MIRALAX) 17 g packet Take 17 g by mouth daily. Hold for loose bowel movements 60 each 0   pravastatin (PRAVACHOL) 10 MG tablet TAKE 1 TABLET BY MOUTH EVERY DAY 90 tablet 3   Rivaroxaban (XARELTO) 15 MG TABS tablet TAKE 1 TABLET BY MOUTH EVERY DAY WITH SUPPER 90 tablet 1   No current facility-administered medications on file prior to visit.    Review of Systems     Objective:  There were no vitals filed for this visit. BP Readings from Last 3 Encounters:  11/28/21 128/70  10/31/21 118/74  10/14/21 116/70   Wt Readings from Last 3 Encounters:  11/28/21 141 lb 12.8 oz (64.3 kg)   10/31/21 136 lb (61.7 kg)  10/14/21 142 lb (64.4 kg)   There is no height or weight on file to calculate BMI.    Physical Exam         Assessment & Plan:    See Problem List for Assessment and Plan of chronic medical problems.

## 2021-12-17 ENCOUNTER — Ambulatory Visit: Payer: Medicare Other | Admitting: Internal Medicine

## 2021-12-17 ENCOUNTER — Encounter: Payer: Self-pay | Admitting: Internal Medicine

## 2021-12-17 ENCOUNTER — Other Ambulatory Visit: Payer: Self-pay

## 2021-12-17 VITALS — BP 114/76 | HR 88 | Temp 97.8°F | Ht 65.5 in | Wt 141.0 lb

## 2021-12-17 DIAGNOSIS — D509 Iron deficiency anemia, unspecified: Secondary | ICD-10-CM

## 2021-12-17 DIAGNOSIS — R600 Localized edema: Secondary | ICD-10-CM

## 2021-12-17 LAB — COMPREHENSIVE METABOLIC PANEL
ALT: 13 U/L (ref 0–35)
AST: 17 U/L (ref 0–37)
Albumin: 4.3 g/dL (ref 3.5–5.2)
Alkaline Phosphatase: 63 U/L (ref 39–117)
BUN: 11 mg/dL (ref 6–23)
CO2: 31 mEq/L (ref 19–32)
Calcium: 10.1 mg/dL (ref 8.4–10.5)
Chloride: 100 mEq/L (ref 96–112)
Creatinine, Ser: 0.72 mg/dL (ref 0.40–1.20)
GFR: 71.57 mL/min (ref >60.00)
Glucose, Bld: 91 mg/dL (ref 70–99)
Potassium: 3.6 mEq/L (ref 3.5–5.1)
Sodium: 138 mEq/L (ref 135–145)
Total Bilirubin: 0.9 mg/dL (ref 0.2–1.2)
Total Protein: 7.6 g/dL (ref 6.0–8.3)

## 2021-12-17 LAB — CBC WITH DIFFERENTIAL/PLATELET
Basophils Absolute: 0 10*3/uL (ref 0.0–0.1)
Basophils Relative: 0.6 % (ref 0.0–3.0)
Eosinophils Absolute: 0.1 10*3/uL (ref 0.0–0.7)
Eosinophils Relative: 1.9 % (ref 0.0–5.0)
HCT: 35.4 % — ABNORMAL LOW (ref 36.0–46.0)
Hemoglobin: 11.8 g/dL — ABNORMAL LOW (ref 12.0–15.0)
Lymphocytes Relative: 29.5 % (ref 12.0–46.0)
Lymphs Abs: 1.3 10*3/uL (ref 0.7–4.0)
MCHC: 33.4 g/dL (ref 30.0–36.0)
MCV: 96.1 fl (ref 78.0–100.0)
Monocytes Absolute: 0.4 10*3/uL (ref 0.1–1.0)
Monocytes Relative: 9.7 % (ref 3.0–12.0)
Neutro Abs: 2.6 10*3/uL (ref 1.4–7.7)
Neutrophils Relative %: 58.3 % (ref 43.0–77.0)
Platelets: 139 10*3/uL — ABNORMAL LOW (ref 150.0–400.0)
RBC: 3.68 Mil/uL — ABNORMAL LOW (ref 3.87–5.11)
RDW: 16.7 % — ABNORMAL HIGH (ref 11.5–15.5)
WBC: 4.4 10*3/uL (ref 4.0–10.5)

## 2021-12-17 LAB — IBC PANEL
Iron: 173 ug/dL — ABNORMAL HIGH (ref 42–145)
Saturation Ratios: 45.8 % (ref 20.0–50.0)
TIBC: 378 ug/dL (ref 250.0–450.0)
Transferrin: 270 mg/dL (ref 212.0–360.0)

## 2021-12-17 LAB — FERRITIN: Ferritin: 53.3 ng/mL (ref 10.0–291.0)

## 2021-12-17 LAB — BRAIN NATRIURETIC PEPTIDE: Pro B Natriuretic peptide (BNP): 310 pg/mL — ABNORMAL HIGH (ref 0.0–100.0)

## 2021-12-17 NOTE — Assessment & Plan Note (Addendum)
Acute on chronic ?Has chronic leg edema, but her edema has been worse in the past week and much worse in the past couple of days ?LLE > RLE --- usually similar  ?Denies injuries, taking Xarelto 15 mg daily-has not missed a dose ?No SOB and lungs are clear - unlikely HF, but will check BNP given severe MR ?[place] LLE tomorrow to r/o dvt ?Increase lasix to 20 mg daily/potassium daily  X 3 days ?Continue to elevate ?Cellulitis is a possibility, but there is no warmth and no significant erythema.  There is slight redness but that really looks like it is related to the swelling itself-we will hold off on an antibiotic for now and make sure there is no blood clot and see what her responses to the increased Lasix dose ? ?

## 2021-12-17 NOTE — Assessment & Plan Note (Signed)
Chronic ?Worse recently ?Has been taking iron two tabs daily ?Check cbc, iron panel ? ?

## 2021-12-17 NOTE — Patient Instructions (Addendum)
? ? ? ?  Blood work was ordered.   ? ? ?Medications changes include :   start lasix and potassium daily for 3 days then return to every other day.  Take the lasix / potassium Wednesday, Thursday and Friday. Then skip Saturday and take it Sunday.  ? ? ?A leg ultrasound was ordered to rule out a blood clot.  ? ? ?Update me after a few days. ? ? ?Return if symptoms worsen or fail to improve. ? ?

## 2021-12-18 ENCOUNTER — Telehealth: Payer: Self-pay | Admitting: Internal Medicine

## 2021-12-18 ENCOUNTER — Ambulatory Visit (HOSPITAL_COMMUNITY)
Admission: RE | Admit: 2021-12-18 | Discharge: 2021-12-18 | Disposition: A | Discharge status: Home / Self Care | Payer: Medicare Other | Source: Ambulatory Visit | Attending: Internal Medicine | Admitting: Internal Medicine

## 2021-12-18 DIAGNOSIS — R600 Localized edema: Secondary | ICD-10-CM | POA: Insufficient documentation

## 2021-12-18 NOTE — Telephone Encounter (Signed)
Please let patient know ultrasound shows no DVT or blood clot.  See how her swelling this ? ?Blood work results anemia is much improved.  Iron levels are normal.  Kidney function liver tests are normal.  No significant fluid overloaded by blood work. ?

## 2021-12-18 NOTE — Telephone Encounter (Signed)
Erica called and states pt is negative for DVT.  ?

## 2021-12-19 ENCOUNTER — Encounter: Payer: Self-pay | Admitting: Internal Medicine

## 2021-12-19 ENCOUNTER — Ambulatory Visit: Payer: Medicare Other | Admitting: Internal Medicine

## 2021-12-19 NOTE — Telephone Encounter (Signed)
Message sent via my-chart since message was already sent yesterday. ? ?Message read and patient responded. ?

## 2021-12-20 ENCOUNTER — Encounter: Payer: Self-pay | Admitting: Internal Medicine

## 2021-12-20 DIAGNOSIS — M7989 Other specified soft tissue disorders: Secondary | ICD-10-CM

## 2021-12-20 NOTE — Progress Notes (Signed)
? ? Aleen Sells D.Judd Gaudier ?[facility] ?[address] [place] [address] ?Phone: [phone] ?  ?Assessment and Plan:   ?  ?1. Neck pain ?2. Strain of right trapezius muscle, subsequent encounter ?-Chronic with exacerbation, subsequent visit ?- Similar presentation of tenderness along right trapezius muscle without red flag features ?- Patient had forgotten that she presented for this problem in 07/2021, so we reiterated prior treatment plan ?- Start Tylenol 500 mg 2-3 times a day for day-to-day pain relief ?- Start using heating pad over trapezius muscle ?- HEP targeting trapezius ?  ?Pertinent previous records reviewed include none ?  ?Follow Up: As needed if no improvement or worsening of symptoms.  Would consider C-spine x-ray versus trigger point injections versus formal PT ?  ?Subjective:   ?I, Jerene Canny, am serving as a Neurosurgeon for [employer] ? ?Chief Complaint: neck pain  ? ?HPI:  ?07/16/21 ?Patient is a 88 year old female presenting with neck pain that started this morning on the right side. Patient cannot turn her head without having pain.  Denies trauma, falls, numbness/tingling/weakness in extremities. ?  ?Radiates: no ?Numbness/tingling:no ?Weakness:no ?Aggravates:movement ?Treatments tried: ice, icy hot, thermacare, tylenol  ?  ?12/23/2021 ?Patient states doing the same  ? ? ?  ?Relevant Historical Information: CAD, hypertension, lower GI bleed ? ?Additional pertinent review of systems negative. ? ? ?Current Outpatient Medications:  ?  acetaminophen (TYLENOL) 325 MG tablet, Take 1-2 tablets (325-650 mg total) by mouth every 6 (six) hours as needed for mild pain (pain score 1-3 or temp > 100.5)., Disp:  , Rfl:  ?  B Complex-C (B-COMPLEX WITH VITAMIN C) tablet, Take 1 tablet by mouth daily., Disp:  , Rfl:  ?  diltiazem (CARDIZEM SR) 120 MG 12 hr capsule, TAKE 1 CAPSULE BY MOUTH 2 TIMES DAILY., Disp: 180 capsule, Rfl: 3 ?  furosemide (LASIX) 20 MG tablet,  TAKE 1 TABLET BY MOUTH EVERY OTHER DAY, Disp: 45 tablet, Rfl: 3 ?  KLOR-CON M10 10 MEQ tablet, TAKE 1 TABLET (10 MEQ TOTAL) BY MOUTH EVERY OTHER DAY., Disp: 45 tablet, Rfl: 3 ?  levothyroxine (SYNTHROID) 75 MCG tablet, TAKE 1 TABLET BY MOUTH EVERY DAY BEFORE BREAKFAST, Disp: 90 tablet, Rfl: 1 ?  polyethylene glycol (MIRALAX) 17 g packet, Take 17 g by mouth daily. Hold for loose bowel movements, Disp: 60 each, Rfl: 0 ?  pravastatin (PRAVACHOL) 10 MG tablet, TAKE 1 TABLET BY MOUTH EVERY DAY, Disp: 90 tablet, Rfl: 3 ?  Rivaroxaban (XARELTO) 15 MG TABS tablet, TAKE 1 TABLET BY MOUTH EVERY DAY WITH SUPPER, Disp: 90 tablet, Rfl: 1  ? ?Objective:   ?  ?Vitals:  ? 12/23/21 1252  ?BP: 122/80  ?Pulse: (!) 104  ?SpO2: 99%  ?Weight: 141 lb (64 kg)  ?Height: 5\' 5"  (1.651 m)  ?  ?  ?Body mass index is 23.46 kg/m?.  ?  ?Physical Exam:   ? ?Cervical Spine: Posture normal ?Skin: normal, intact ? ?Neurological:  ? ?Strength: ? Right  Left   ?Deltoid 5/5 5/5  ?Bicep 5/5  5/5  ?Tricep 5/5 5/5  ?Wrist Flexion 5/5 5/5  ?Wrist Extension 5/5 5/5  ?Grip 5/5 5/5  ?Finger Abduction 5/5 5/5  ? ?Sensation: intact to light touch in upper extremities bilaterally ? ?Spurling's:  negative bilaterally ?Neck ROM: Reduced right sidebending and rotation, otherwise full active ROM ?TTP: Right trapezius ?NTTP: cervical paraspinal, cervical spinous processes, thoracic paraspinal, left trapezius  ? ? ?Electronically signed by:  ?  Aleen Sells D.Judd Gaudier ?[facility] ?1:39 PM 12/23/21 ?

## 2021-12-23 ENCOUNTER — Other Ambulatory Visit (INDEPENDENT_AMBULATORY_CARE_PROVIDER_SITE_OTHER): Payer: Medicare Other

## 2021-12-23 ENCOUNTER — Ambulatory Visit: Payer: Medicare Other | Admitting: Sports Medicine

## 2021-12-23 ENCOUNTER — Other Ambulatory Visit: Payer: Self-pay

## 2021-12-23 VITALS — BP 122/80 | HR 104 | Ht 65.0 in | Wt 141.0 lb

## 2021-12-23 DIAGNOSIS — M7989 Other specified soft tissue disorders: Secondary | ICD-10-CM

## 2021-12-23 DIAGNOSIS — M542 Cervicalgia: Secondary | ICD-10-CM | POA: Diagnosis not present

## 2021-12-23 DIAGNOSIS — S46811D Strain of other muscles, fascia and tendons at shoulder and upper arm level, right arm, subsequent encounter: Secondary | ICD-10-CM | POA: Diagnosis not present

## 2021-12-23 LAB — BASIC METABOLIC PANEL
BUN: 12 mg/dL (ref 6–23)
CO2: 30 mEq/L (ref 19–32)
Calcium: 9.7 mg/dL (ref 8.4–10.5)
Chloride: 102 mEq/L (ref 96–112)
Creatinine, Ser: 0.73 mg/dL (ref 0.40–1.20)
GFR: 70.39 mL/min (ref >60.00)
Glucose, Bld: 99 mg/dL (ref 70–99)
Potassium: 3.9 mEq/L (ref 3.5–5.1)
Sodium: 138 mEq/L (ref 135–145)

## 2021-12-23 NOTE — Patient Instructions (Addendum)
Thank you for coming in today ? ?Today's recommendations include: ?- Start Tylenol 500 mg 2-3 times a day for day-to-day pain relief ?- Start using heating pad over trapezius muscle ?- HEP targeting trapezius ?  ? ?Follow Up: As needed if no improvement or worsening of symptoms.  Would consider C-spine x-ray versus trigger point injections versus formal PT ?

## 2021-12-26 ENCOUNTER — Encounter: Payer: Self-pay | Admitting: Internal Medicine

## 2021-12-26 NOTE — Progress Notes (Signed)
? ? ?Subjective:  ? ? Patient ID: Grace Alvarado, female    DOB: [birth year], 88 y.o.   MRN: [id number] ? ?This visit occurred during the SARS-CoV-2 public health emergency.  Safety protocols were in place, including screening questions prior to the visit, additional usage of staff PPE, and extensive cleaning of exam room while observing appropriate contact time as indicated for disinfecting solutions. ? ? ? ?HPI ?Chasmine is here for  ?Chief Complaint  ?Patient presents with  ? Leg Pain  ?  Left leg swelling  ? ? ? ?Left leg swelling - her left leg is a little swollen at night and the swelling goes down overnight.  The right leg is swollen as well throughout the day and also improves overnight, but it is not as swollen as the left leg.  The left leg gets hard.  No pain in the leg. No concerning redness.  No fevers. ? ?She denies any increase in shortness of breath, chest pain or palpitations.  She typically sits during the day with her legs down-she does sit in a recliner, but does not elevate them even though she knows she should.  She has not been walking as much-her legs do get tired sometimes when she walks. ? ? ? ?Medications and allergies reviewed with patient and updated if appropriate. ? ?Current Outpatient Medications on File Prior to Visit  ?Medication Sig Dispense Refill  ? acetaminophen (TYLENOL) 325 MG tablet Take 1-2 tablets (325-650 mg total) by mouth every 6 (six) hours as needed for mild pain (pain score 1-3 or temp > 100.5).    ? B Complex-C (B-COMPLEX WITH VITAMIN C) tablet Take 1 tablet by mouth daily.    ? diltiazem (CARDIZEM SR) 120 MG 12 hr capsule TAKE 1 CAPSULE BY MOUTH 2 TIMES DAILY. 180 capsule 3  ? levothyroxine (SYNTHROID) 75 MCG tablet TAKE 1 TABLET BY MOUTH EVERY DAY BEFORE BREAKFAST 90 tablet 1  ? polyethylene glycol (MIRALAX) 17 g packet Take 17 g by mouth daily. Hold for loose bowel movements 60 each 0  ? pravastatin (PRAVACHOL) 10 MG tablet TAKE 1 TABLET BY MOUTH EVERY DAY 90 tablet 3   ? Rivaroxaban (XARELTO) 15 MG TABS tablet TAKE 1 TABLET BY MOUTH EVERY DAY WITH SUPPER 90 tablet 1  ? ?No current facility-administered medications on file prior to visit.  ? ? ?Review of Systems  ?Constitutional:  Negative for chills and fever.  ?Respiratory:  Negative for cough, shortness of breath and wheezing.   ?Cardiovascular:  Positive for leg swelling. Negative for chest pain and palpitations.  ? ?   ?Objective:  ? ?Vitals:  ? 12/27/21 1442  ?BP: 112/78  ?Pulse: 83  ?Temp: 98.4 ?F (36.9 ?C)  ?SpO2: 97%  ? ?BP Readings from Last 3 Encounters:  ?12/27/21 112/78  ?12/23/21 122/80  ?12/17/21 114/76  ? ?Wt Readings from Last 3 Encounters:  ?12/27/21 137 lb (62.1 kg)  ?12/23/21 141 lb (64 kg)  ?12/17/21 141 lb (64 kg)  ? ?Body mass index is 22.8 kg/m?. ? ?  ?Physical Exam ?Constitutional:   ?   General: She is not in acute distress. ?   Appearance: Normal appearance.  ?HENT:  ?   Head: Normocephalic and atraumatic.  ?Eyes:  ?   Conjunctiva/sclera: Conjunctivae normal.  ?Cardiovascular:  ?   Rate and Rhythm: Normal rate. Rhythm irregular.  ?   Heart sounds: Murmur (2/6 systolic) heard.  ?Pulmonary:  ?   Effort: Pulmonary effort is normal. No respiratory distress.  ?  Breath sounds: Normal breath sounds. No wheezing.  ?Musculoskeletal:  ?   Right lower leg: Edema (Mild 1+ swelling one third way left leg) present.  ?   Left lower leg: Edema (Mild 1+ swelling two thirds way up leg) present.  ?Skin: ?   Findings: Erythema (Minimal bilateral lower extremity erythema-generalized that is nontender and nonwarmth-looks to be related to swelling) present. No rash.  ?Neurological:  ?   Mental Status: She is alert. Mental status is at baseline.  ?Psychiatric:     ?   Mood and Affect: Mood normal.     ?   Behavior: Behavior normal.  ? ?   ? ?Lab Results  ?Component Value Date  ? WBC 4.4 12/17/2021  ? HGB 11.8 (L) 12/17/2021  ? HCT 35.4 (L) 12/17/2021  ? PLT 139.0 (L) 12/17/2021  ? GLUCOSE 99 12/23/2021  ? CHOL 121 12/18/2020   ? TRIG 55.0 12/18/2020  ? HDL 58.30 12/18/2020  ? LDLCALC 52 12/18/2020  ? ALT 13 12/17/2021  ? AST 17 12/17/2021  ? NA 138 12/23/2021  ? K 3.9 12/23/2021  ? CL 102 12/23/2021  ? CREATININE 0.73 12/23/2021  ? BUN 12 12/23/2021  ? CO2 30 12/23/2021  ? TSH 1.81 08/14/2021  ? INR 2.64 05/21/2018  ? HGBA1C 5.2 12/18/2020  ? ? ?VAS [place] LOWER EXTREMITY VENOUS (DVT) ? Lower Venous DVT Study ? ?Patient Name:  Grace Alvarado  Date of Exam:   12/18/2021 ?Medical Rec #: [id number]     Accession #:    [id number] ?Date of Birth: 08/23/1925    Patient Gender: F ?Patient Age:   11 years ?Exam Location:  [facility] ?Procedure:      VAS [place] LOWER EXTREMITY VENOUS (DVT) ?Referring Phys: Kennyth Arnold Nil Bolser ? ?-------------------------------------------------------------------------------- ?  ?Indications: Edema involving the left lower extremity that resolves at night and returns during the day. ?  ?Performing Technologist: Dorthula Matas RVS, RCS ? ?  ?Examination Guidelines: ?A complete evaluation includes B-mode imaging, spectral Doppler, color Doppler, ?and power Doppler as needed of all accessible portions of each vessel. Bilateral ?testing is considered an integral part of a complete examination. Limited ?examinations for reoccurring indications may be performed as noted. The reflux ?portion of the exam is performed with the patient in reverse Trendelenburg. ? ?  ? ?+---------+---------------+---------+-----------+----------+--------------+ ?LEFT     CompressibilityPhasicitySpontaneityPropertiesThrombus Aging ?+---------+---------------+---------+-----------+----------+--------------+ ?CFV      Full                                                        ?+---------+---------------+---------+-----------+----------+--------------+ ?SFJ      Full                                                        ?+---------+---------------+---------+-----------+----------+--------------+ ?FV Prox  Full                                                         ?+---------+---------------+---------+-----------+----------+--------------+ ?FV Mid   Full                                                        ?+---------+---------------+---------+-----------+----------+--------------+ ?  FV DistalFull                                                        ?+---------+---------------+---------+-----------+----------+--------------+ ?POP      Full                                                        ?+---------+---------------+---------+-----------+----------+--------------+ ?PTV      Full                                                        ?+---------+---------------+---------+-----------+----------+--------------+ ?PERO     Full                                                        ?+---------+---------------+---------+-----------+----------+--------------+ ?GSV      Full                                                        ?+---------+---------------+---------+-----------+----------+--------------+ ?SSV      Full                                                        ?+---------+---------------+---------+-----------+----------+--------------+ ? ?  ? ?  ? ?  ? ?Findings reported to [place] at 12:17 pm. ?  ?Summary: ?RIGHT: ?- No evidence of common femoral vein obstruction. ?  ?LEFT: ?- There is no evidence of deep vein thrombosis in the lower extremity. ?- There is no evidence of superficial venous thrombosis. ?  ?- No cystic structure found in the popliteal fossa. ?  ? ?*See table(s) above for measurements and observations. ? ?Electronically signed by Lemar LivingsBrandon Cain MD on 12/18/2021 at 1:36:07 PM. ?  ? ?  Final   ? ? ? ? ? ?Assessment & Plan:  ? ? ?See Problem List for Assessment and Plan of chronic medical problems.  ? ? ? ? ?

## 2021-12-27 ENCOUNTER — Ambulatory Visit: Payer: Medicare Other | Admitting: Internal Medicine

## 2021-12-27 ENCOUNTER — Encounter: Payer: Self-pay | Admitting: Internal Medicine

## 2021-12-27 ENCOUNTER — Other Ambulatory Visit: Payer: Self-pay

## 2021-12-27 DIAGNOSIS — R600 Localized edema: Secondary | ICD-10-CM

## 2021-12-27 DIAGNOSIS — I10 Essential (primary) hypertension: Secondary | ICD-10-CM | POA: Diagnosis not present

## 2021-12-27 MED ORDER — POTASSIUM CHLORIDE CRYS ER 10 MEQ PO TBCR: 10.0000 meq | EXTENDED_RELEASE_TABLET | Freq: Every day | ORAL | 3 refills | Status: DC

## 2021-12-27 MED ORDER — FUROSEMIDE 20 MG PO TABS: 20.0000 mg | ORAL_TABLET | Freq: Every day | ORAL | 3 refills | Status: DC

## 2021-12-27 NOTE — Assessment & Plan Note (Signed)
Chronic Blood pressure well controlled Continue diltiazem 120 mg twice daily 

## 2021-12-27 NOTE — Patient Instructions (Addendum)
? ? ?  Elevate your legs when you are sitting.  Start walking daily up and down your hallway.  ? ? ?Medications changes include :   continue the water pill daily and the potassium daily ? ? ?Your prescription(s) have been sent to your pharmacy.  ? ? ? ? ?Return in about 6 months (around 06/29/2022) for follow up. ? ?

## 2021-12-27 NOTE — Assessment & Plan Note (Signed)
Acute on chronic ?Has chronic bilateral lower extremity swelling, but her leg swelling has gotten worse in the left leg is worse than the right ?Ultrasound for DVT earlier this month negative ?No concerning erythema or evidence of infection ?No increase in shortness of breath, chest pain or palpitations to make me think this is cardiac related ?Swelling bilaterally improves and her legs look "normal" first thing in the morning so likely edema related to gravity ?Continue furosemide 20 mg daily and potassium daily ?Encouraged her to start elevating her legs when sitting during the day-she does sit in a recliner ?Walk is much as possible-she does have some leg pain when she walks, but the walking will help improve the swelling ?She will continue to monitor ? ?

## 2021-12-31 ENCOUNTER — Ambulatory Visit: Payer: Medicare Other | Admitting: Family Medicine

## 2022-01-04 ENCOUNTER — Other Ambulatory Visit: Payer: Self-pay | Admitting: Internal Medicine

## 2022-01-14 ENCOUNTER — Other Ambulatory Visit: Payer: Self-pay | Admitting: Cardiology

## 2022-01-15 NOTE — Telephone Encounter (Signed)
Prescription refill request for Xarelto received.  ?Indication:Afib ?Last office visit:2/23 ?Weight:62.1 kg ?Age:88 ?Scr:0.7 ?CrCl:48.18 ml/min ? ?Prescription refilled ? ?

## 2022-01-16 ENCOUNTER — Ambulatory Visit: Payer: Medicare Other

## 2022-01-17 ENCOUNTER — Ambulatory Visit: Payer: Medicare Other

## 2022-01-20 ENCOUNTER — Ambulatory Visit (INDEPENDENT_AMBULATORY_CARE_PROVIDER_SITE_OTHER): Payer: Medicare Other

## 2022-01-20 DIAGNOSIS — E538 Deficiency of other specified B group vitamins: Secondary | ICD-10-CM

## 2022-01-20 MED ORDER — CYANOCOBALAMIN 1000 MCG/ML IJ SOLN
1000.0000 ug | Freq: Once | INTRAMUSCULAR | Status: AC
  Administered 2022-01-20: 1000 ug via INTRAMUSCULAR

## 2022-01-20 NOTE — Progress Notes (Signed)
Pt here for monthly B12 injection per Dr. Burns ? ?B12 1000mcg given IM, and pt tolerated injection well. ? ?Next B12 injection scheduled for 02/19/22 ?

## 2022-01-22 ENCOUNTER — Encounter: Payer: Self-pay | Admitting: Internal Medicine

## 2022-01-23 MED ORDER — FUROSEMIDE 20 MG PO TABS: 20.0000 mg | ORAL_TABLET | ORAL | 3 refills | Status: DC

## 2022-01-23 MED ORDER — POTASSIUM CHLORIDE CRYS ER 10 MEQ PO TBCR: 10.0000 meq | EXTENDED_RELEASE_TABLET | ORAL | 3 refills | Status: DC

## 2022-02-19 ENCOUNTER — Ambulatory Visit (INDEPENDENT_AMBULATORY_CARE_PROVIDER_SITE_OTHER): Payer: Medicare Other

## 2022-02-19 DIAGNOSIS — E538 Deficiency of other specified B group vitamins: Secondary | ICD-10-CM

## 2022-02-19 MED ORDER — CYANOCOBALAMIN 1000 MCG/ML IJ SOLN
1000.0000 ug | Freq: Once | INTRAMUSCULAR | Status: AC
  Administered 2022-02-19: 1000 ug via INTRAMUSCULAR

## 2022-02-19 NOTE — Progress Notes (Signed)
Patient here for monthly B12 injection per Dr. Lawerance Bach. B12 1000 mcg given in Right IM and patient tolerated injection well today.  ?

## 2022-03-13 ENCOUNTER — Ambulatory Visit: Payer: Medicare Other | Admitting: Podiatry

## 2022-03-13 DIAGNOSIS — M79674 Pain in right toe(s): Secondary | ICD-10-CM | POA: Diagnosis not present

## 2022-03-13 DIAGNOSIS — Z7901 Long term (current) use of anticoagulants: Secondary | ICD-10-CM | POA: Diagnosis not present

## 2022-03-13 DIAGNOSIS — M79675 Pain in left toe(s): Secondary | ICD-10-CM | POA: Diagnosis not present

## 2022-03-13 DIAGNOSIS — B351 Tinea unguium: Secondary | ICD-10-CM

## 2022-03-16 NOTE — Progress Notes (Signed)
Subjective: 88 y.o. returns the office today for painful, elongated, thickened toenails which she cannot trim herself and for calluses. Denies any redness or drainage around the nails/calluses.  No other pain to her feet.  No recent injury.  Denies any open lesions.  She is on Xarelto  PCP: Pincus Sanes, MD Last seen 12/27/2021  Objective: NAD- presents with caregiver DP/PT pulses palpable, CRT less than 3 seconds Nails hypertrophic, dystrophic, elongated, brittle, discolored 10. There is tenderness overlying the nails 1-5 on the left and 1-5 on the right. There is no surrounding erythema or drainage along the nail sites. Insignificant hyperkeratotic tissue medial first MPJ.  No ulcerations identified No other areas of discomfort to bilateral lower extremities today.  No pain with calf compression, swelling, warmth, erythema.  Assessment: Patient presents with symptomatic onychomycosis  Plan: -Treatment options including alternatives, risks, complications were discussed -Nails sharply debrided x 10 without complication/bleeding. -Moisturizer to help with callus regrowth. -Discussed daily foot inspection. If there are any changes, to call the office immediately.  -Follow-up in 3 months or sooner if any problems are to arise. In the meantime, encouraged to call the office with any questions, concerns, changes symptoms.  Ovid Curd, DPM

## 2022-03-26 ENCOUNTER — Ambulatory Visit (INDEPENDENT_AMBULATORY_CARE_PROVIDER_SITE_OTHER): Payer: Medicare Other | Admitting: *Deleted

## 2022-03-26 DIAGNOSIS — E538 Deficiency of other specified B group vitamins: Secondary | ICD-10-CM | POA: Diagnosis not present

## 2022-03-26 MED ORDER — CYANOCOBALAMIN 1000 MCG/ML IJ SOLN
1000.0000 ug | Freq: Once | INTRAMUSCULAR | Status: AC
  Administered 2022-03-26: 1000 ug via INTRAMUSCULAR

## 2022-03-26 NOTE — Progress Notes (Signed)
Patient was here for her b12 injection. Given in right deltoid. Patient tolerated well   Please co sign

## 2022-04-18 ENCOUNTER — Ambulatory Visit: Payer: Medicare Other | Admitting: Family Medicine

## 2022-04-18 ENCOUNTER — Encounter: Payer: Self-pay | Admitting: Internal Medicine

## 2022-04-18 ENCOUNTER — Encounter: Payer: Self-pay | Admitting: Family Medicine

## 2022-04-18 ENCOUNTER — Ambulatory Visit (INDEPENDENT_AMBULATORY_CARE_PROVIDER_SITE_OTHER): Payer: Medicare Other

## 2022-04-18 VITALS — BP 120/64 | HR 90 | Temp 98.3°F | Ht 65.0 in | Wt 142.0 lb

## 2022-04-18 DIAGNOSIS — Z7901 Long term (current) use of anticoagulants: Secondary | ICD-10-CM

## 2022-04-18 DIAGNOSIS — S90122A Contusion of left lesser toe(s) without damage to nail, initial encounter: Secondary | ICD-10-CM | POA: Insufficient documentation

## 2022-04-18 DIAGNOSIS — S99922A Unspecified injury of left foot, initial encounter: Secondary | ICD-10-CM

## 2022-04-18 NOTE — Patient Instructions (Signed)
Please go downstairs for an x-ray of your left foot before leaving today.  No sign of infection at the moment.  I recommend that you continue to elevate your left foot.  You can use an ice pack, you can soak your foot in Epsom salt, and take Tylenol as needed for pain.  Follow-up if you notice any worsening redness, pain or if you see drainage or any sign of infection.

## 2022-04-18 NOTE — Progress Notes (Unsigned)
Subjective:     Patient ID: Grace Alvarado, female    DOB: [birth year], 88 y.o.   MRN: [id number]  Chief Complaint  Patient presents with   Office Visit    Red, bruised, swollen pinky toe left foot    HPI Patient is in today for swollen and bruised 5th toe on her left foot. States she accidentally subbed her toe in a door jam 2 days ago. Did not see bleeding. No fall. Using her rolling walker today.  No numbness, tingling or significant pain. Taking nothing for symptoms.  She is taking Xarelto. No diabetes history.   No other concerns.    Health Maintenance Due  Topic Date Due   Zoster Vaccines- Shingrix (1 of 2) Never done   DEXA SCAN  Never done   COVID-19 Vaccine (3 - Pfizer risk series) 12/12/2019    Past Medical History:  Diagnosis Date   Atrial fibrillation (HCC)    Hyperlipidemia    Hypertension    Hypothyroidism    Mitral regurgitation    MVP (mitral valve prolapse)     Past Surgical History:  Procedure Laterality Date   APPENDECTOMY     c section     CESAREAN SECTION     CHOLECYSTECTOMY     COLONOSCOPY WITH PROPOFOL N/A 11/09/2018   Procedure: COLONOSCOPY WITH PROPOFOL;  Surgeon: Sherrilyn Rist, MD;  Location: [facility] ENDOSCOPY;  Service: Gastroenterology;  Laterality: N/A;   ESOPHAGOGASTRODUODENOSCOPY (EGD) WITH PROPOFOL N/A 11/09/2018   Procedure: ESOPHAGOGASTRODUODENOSCOPY (EGD) WITH PROPOFOL;  Surgeon: Sherrilyn Rist, MD;  Location: [facility] ENDOSCOPY;  Service: Gastroenterology;  Laterality: N/A;   HOT HEMOSTASIS N/A 11/09/2018   Procedure: HOT HEMOSTASIS (ARGON PLASMA COAGULATION/BICAP);  Surgeon: Sherrilyn Rist, MD;  Location: [facility] ENDOSCOPY;  Service: Gastroenterology;  Laterality: N/A;   I & D EXTREMITY Left 03/02/2020   Procedure: evacuation of left knee hematoma, possible wound vac placement;  Surgeon: Cammy Copa, MD;  Location: [facility] OR;  Service: Orthopedics;  Laterality: Left;   SUBMUCOSAL INJECTION  11/09/2018   Procedure: SUBMUCOSAL INJECTION;   Surgeon: Sherrilyn Rist, MD;  Location: [facility] ENDOSCOPY;  Service: Gastroenterology;;    Family History  Problem Relation Age of Onset   Cancer Mother    Dementia Mother     Social History   Socioeconomic History   Marital status: Widowed    Spouse name: Not on file   Number of children: 3   Years of education: Not on file   Highest education level: Not on file  Occupational History   Occupation: retired  Tobacco Use   Smoking status: Former   Smokeless tobacco: Never  Building services engineer Use: Never used  Substance and Sexual Activity   Alcohol use: Not Currently   Drug use: Never   Sexual activity: Not Currently  Other Topics Concern   Not on file  Social History Narrative   Not on file   Social Determinants of Health   Financial Resource Strain: Not on file  Food Insecurity: Not on file  Transportation Needs: Not on file  Physical Activity: Not on file  Stress: Not on file  Social Connections: Not on file  Intimate Partner Violence: Not on file    Outpatient Medications Prior to Visit  Medication Sig Dispense Refill   acetaminophen (TYLENOL) 325 MG tablet Take 1-2 tablets (325-650 mg total) by mouth every 6 (six) hours as needed for mild pain (pain score 1-3 or temp > 100.5).  B Complex-C (B-COMPLEX WITH VITAMIN C) tablet Take 1 tablet by mouth daily.     diltiazem (CARDIZEM SR) 120 MG 12 hr capsule TAKE 1 CAPSULE BY MOUTH 2 TIMES DAILY. 180 capsule 3   furosemide (LASIX) 20 MG tablet Take 1 tablet (20 mg total) by mouth every other day. 90 tablet 3   levothyroxine (SYNTHROID) 75 MCG tablet TAKE 1 TABLET BY MOUTH EVERY DAY BEFORE BREAKFAST 90 tablet 1   polyethylene glycol (MIRALAX) 17 g packet Take 17 g by mouth daily. Hold for loose bowel movements 60 each 0   potassium chloride (KLOR-CON M10) 10 MEQ tablet Take 1 tablet (10 mEq total) by mouth every other day. 90 tablet 3   pravastatin (PRAVACHOL) 10 MG tablet TAKE 1 TABLET BY MOUTH EVERY DAY 90 tablet 3    XARELTO 15 MG TABS tablet TAKE 1 TABLET BY MOUTH EVERY DAY WITH SUPPER 90 tablet 1   No facility-administered medications prior to visit.    Allergies  Allergen Reactions   Codeine Other (See Comments)    unknown   Penicillins Other (See Comments)    unknown   Vancomycin Other (See Comments)    unknown   Zithromax [Azithromycin] Other (See Comments)    unknown   Latex Rash    ROS     Objective:    Physical Exam  BP 120/64 (BP Location: Left Arm, Patient Position: Sitting, Cuff Size: Large)   Pulse 90   Temp 98.3 F (36.8 C) (Oral)   Ht 5\' 5"  (1.651 m)   Wt 142 lb (64.4 kg)   LMP  (LMP Unknown)   SpO2 99%   BMI 23.63 kg/m  Wt Readings from Last 3 Encounters:  04/18/22 142 lb (64.4 kg)  12/27/21 137 lb (62.1 kg)  12/23/21 141 lb (64 kg)     Alert and oriented and in no acute distress. Respirations unlabored. 5th digit on left toe with marked bruising, hematoma. Skin intact, nail bed not affected. Some tenderness to palpation to MTP joint of 5th toe.     Assessment & Plan:   Problem List Items Addressed This Visit       Other   Hematoma of toe of left foot    On anticoagulant. X ray ordered. Should improve gradually. Follow up if any sign of infection or worsening symptoms.       Toe injury, left, initial encounter - Primary    X ray ordered. Skin intact. Significant bruising due to anticoagulant. She is not in severe pain. May take Tylenol, use ice and elevate if pain worsens.       Relevant Orders   DG Foot Complete Left (Completed)   Other Visit Diagnoses     On anticoagulant therapy           I am having 12/25/21 maintain her acetaminophen, B-complex with vitamin C, polyethylene glycol, pravastatin, diltiazem, levothyroxine, Xarelto, furosemide, and potassium chloride.  No orders of the defined types were placed in this encounter.

## 2022-04-20 ENCOUNTER — Encounter: Payer: Self-pay | Admitting: Internal Medicine

## 2022-04-20 NOTE — Assessment & Plan Note (Signed)
On anticoagulant. X ray ordered. Should improve gradually. Follow up if any sign of infection or worsening symptoms.

## 2022-04-20 NOTE — Assessment & Plan Note (Signed)
X ray ordered. Skin intact. Significant bruising due to anticoagulant. She is not in severe pain. May take Tylenol, use ice and elevate if pain worsens.

## 2022-04-25 ENCOUNTER — Ambulatory Visit (INDEPENDENT_AMBULATORY_CARE_PROVIDER_SITE_OTHER): Payer: Medicare Other

## 2022-04-25 DIAGNOSIS — E538 Deficiency of other specified B group vitamins: Secondary | ICD-10-CM

## 2022-04-25 MED ORDER — CYANOCOBALAMIN 1000 MCG/ML IJ SOLN
1000.0000 ug | Freq: Once | INTRAMUSCULAR | Status: AC
  Administered 2022-04-25: 1000 ug via INTRAMUSCULAR

## 2022-04-25 NOTE — Progress Notes (Signed)
B12 given Please cosign 

## 2022-05-19 NOTE — Progress Notes (Signed)
HPI:FU atrial fibrillation. Patient previously resided in [place].  Patient with history of permanent atrial fibrillation.  Previously failed amiodarone and multaq per records. Nuclear study 2012 showed no ischemia with normal LV function. ABIs April 2021 normal. Echocardiogram March 2023 showed normal LV function, posterior mitral valve prolapse with severe mitral regurgitation, mild to moderate tricuspid regurgitation, severe biatrial enlargement. Patient made it clear previously that she did not want valve surgery. Venous Dopplers March 2023 showed no DVT. Since last seen she denies dyspnea, chest pain, palpitations or syncope.  No bleeding.  She has mild pedal edema which typically improves overnight.  She has not fallen.  Current Outpatient Medications  Medication Sig Dispense Refill   acetaminophen (TYLENOL) 325 MG tablet Take 1-2 tablets (325-650 mg total) by mouth every 6 (six) hours as needed for mild pain (pain score 1-3 or temp > 100.5).     B Complex-C (B-COMPLEX WITH VITAMIN C) tablet Take 1 tablet by mouth daily.     diltiazem (CARDIZEM SR) 120 MG 12 hr capsule TAKE 1 CAPSULE BY MOUTH 2 TIMES DAILY. 180 capsule 3   furosemide (LASIX) 20 MG tablet Take 1 tablet (20 mg total) by mouth every other day. 90 tablet 3   levothyroxine (SYNTHROID) 75 MCG tablet TAKE 1 TABLET BY MOUTH EVERY DAY BEFORE BREAKFAST 90 tablet 1   polyethylene glycol (MIRALAX) 17 g packet Take 17 g by mouth daily. Hold for loose bowel movements 60 each 0   potassium chloride (KLOR-CON M10) 10 MEQ tablet Take 1 tablet (10 mEq total) by mouth every other day. 90 tablet 3   pravastatin (PRAVACHOL) 10 MG tablet TAKE 1 TABLET BY MOUTH EVERY DAY 90 tablet 3   XARELTO 15 MG TABS tablet TAKE 1 TABLET BY MOUTH EVERY DAY WITH SUPPER 90 tablet 1   No current facility-administered medications for this visit.     Past Medical History:  Diagnosis Date   Atrial fibrillation (HCC)    Hyperlipidemia    Hypertension     Hypothyroidism    Mitral regurgitation    MVP (mitral valve prolapse)     Past Surgical History:  Procedure Laterality Date   APPENDECTOMY     c section     CESAREAN SECTION     CHOLECYSTECTOMY     COLONOSCOPY WITH PROPOFOL N/A 11/09/2018   Procedure: COLONOSCOPY WITH PROPOFOL;  Surgeon: Sherrilyn Rist, MD;  Location: [facility] ENDOSCOPY;  Service: Gastroenterology;  Laterality: N/A;   ESOPHAGOGASTRODUODENOSCOPY (EGD) WITH PROPOFOL N/A 11/09/2018   Procedure: ESOPHAGOGASTRODUODENOSCOPY (EGD) WITH PROPOFOL;  Surgeon: Sherrilyn Rist, MD;  Location: [facility] ENDOSCOPY;  Service: Gastroenterology;  Laterality: N/A;   HOT HEMOSTASIS N/A 11/09/2018   Procedure: HOT HEMOSTASIS (ARGON PLASMA COAGULATION/BICAP);  Surgeon: Sherrilyn Rist, MD;  Location: [facility] ENDOSCOPY;  Service: Gastroenterology;  Laterality: N/A;   I & D EXTREMITY Left 03/02/2020   Procedure: evacuation of left knee hematoma, possible wound vac placement;  Surgeon: Cammy Copa, MD;  Location: [facility] OR;  Service: Orthopedics;  Laterality: Left;   SUBMUCOSAL INJECTION  11/09/2018   Procedure: SUBMUCOSAL INJECTION;  Surgeon: Sherrilyn Rist, MD;  Location: [facility] ENDOSCOPY;  Service: Gastroenterology;;    Social History   Socioeconomic History   Marital status: Widowed    Spouse name: Not on file   Number of children: 3   Years of education: Not on file   Highest education level: Not on file  Occupational History   Occupation:  retired  Tobacco Use   Smoking status: Former   Smokeless tobacco: Never  Building services engineer Use: Never used  Substance and Sexual Activity   Alcohol use: Not Currently   Drug use: Never   Sexual activity: Not Currently  Other Topics Concern   Not on file  Social History Narrative   Not on file   Social Determinants of Health   Financial Resource Strain: Not on file  Food Insecurity: Not on file  Transportation Needs: Not on file  Physical Activity: Not on file  Stress: Not on file  Social  Connections: Not on file  Intimate Partner Violence: Not on file    Family History  Problem Relation Age of Onset   Cancer Mother    Dementia Mother     ROS: no fevers or chills, productive cough, hemoptysis, dysphasia, odynophagia, melena, hematochezia, dysuria, hematuria, rash, seizure activity, orthopnea, PND, claudication. Remaining systems are negative.  Physical Exam: Well-developed well-nourished in no acute distress.  Skin is warm and dry.  HEENT is normal.  Neck is supple.  Chest is clear to auscultation with normal expansion.  Cardiovascular exam is irregular Abdominal exam nontender or distended. No masses palpated. Extremities show trace edema. neuro grossly intact  ECG-atrial fibrillation at a rate of 86, normal axis, nonspecific ST changes, low voltage.  Personally reviewed  A/P  1 mitral valve prolapse with severe mitral regurgitation-given patient's age we plan conservative measures and patient also does not want to consider surgical intervention.  We will also not pursue further imaging as it would not change our management.  2 permanent atrial fibrillation-continue Cardizem for rate control.  Continue Xarelto.  Check hemoglobin and renal function.  Note we discussed risk-benefit of anticoagulation.  She has not fallen in the past 3 years and I therefore think benefit outweighs risk and we will continue.  3 hypertension-patient's blood pressure is controlled today.  Continue present medical regimen.  4 hyperlipidemia-continue statin.  Check lipids and liver.  5 lower extremity edema-continue diuretic at present dose.  Check potassium and renal function.  Olga Millers, MD

## 2022-05-23 ENCOUNTER — Encounter: Payer: Self-pay | Admitting: Internal Medicine

## 2022-05-25 ENCOUNTER — Encounter: Payer: Self-pay | Admitting: Internal Medicine

## 2022-05-29 ENCOUNTER — Ambulatory Visit (INDEPENDENT_AMBULATORY_CARE_PROVIDER_SITE_OTHER): Payer: Medicare Other

## 2022-05-29 DIAGNOSIS — E538 Deficiency of other specified B group vitamins: Secondary | ICD-10-CM | POA: Diagnosis not present

## 2022-05-29 MED ORDER — CYANOCOBALAMIN 1000 MCG/ML IJ SOLN
1000.0000 ug | Freq: Once | INTRAMUSCULAR | Status: AC
  Administered 2022-05-29: 1000 ug via INTRAMUSCULAR

## 2022-05-29 NOTE — Progress Notes (Signed)
After obtaining consent, and per orders of Dr. Lawerance Bach, injection of B12 given on the right deltoid  by Ferdie Ping. Patient instructed to report any adverse reaction to me immediately.

## 2022-05-30 ENCOUNTER — Ambulatory Visit: Payer: Medicare Other

## 2022-05-30 ENCOUNTER — Encounter: Payer: Self-pay | Admitting: Cardiology

## 2022-05-30 ENCOUNTER — Ambulatory Visit: Payer: Medicare Other | Admitting: Cardiology

## 2022-05-30 VITALS — BP 141/77 | HR 86 | Ht 63.0 in | Wt 142.0 lb

## 2022-05-30 DIAGNOSIS — I482 Chronic atrial fibrillation, unspecified: Secondary | ICD-10-CM | POA: Diagnosis not present

## 2022-05-30 DIAGNOSIS — I340 Nonrheumatic mitral (valve) insufficiency: Secondary | ICD-10-CM

## 2022-05-30 DIAGNOSIS — I10 Essential (primary) hypertension: Secondary | ICD-10-CM

## 2022-05-30 DIAGNOSIS — R600 Localized edema: Secondary | ICD-10-CM

## 2022-05-30 NOTE — Patient Instructions (Signed)
Medication Instructions:  The current medical regimen is effective;  continue present plan and medications as directed. Please refer to the Current Medication list given to you today.   *If you need a refill on your cardiac medications before your next appointment, please call your pharmacy*  Lab Work:    FASTIN LIPID, CBC, LFT AND CBC     If you have labs (blood work) drawn today and your tests are completely normal, you will receive your results only by:  1-MyChart Message (if you have MyChart) OR  2-A paper copy in the mail.  If you have any lab test that is abnormal or we need to change your treatment, we will call you to review the results.  Follow-Up: Your next appointment:  12 month(s) In Person with Olga Millers, MD     Please call our office 2 months in advance to schedule this appointment   At [facility], you and your health needs are our priority.  As part of our continuing mission to provide you with exceptional heart care, we have created designated Provider Care Teams.  These Care Teams include your primary Cardiologist (physician) and Advanced Practice Providers (APPs -  Physician Assistants and Nurse Practitioners) who all work together to provide you with the care you need, when you need it.  Important Information About Sugar

## 2022-06-02 LAB — CBC

## 2022-06-03 LAB — LIPID PANEL
Chol/HDL Ratio: 2 ratio (ref 0.0–4.4)
Cholesterol, Total: 127 mg/dL (ref 100–199)
HDL: 64 mg/dL (ref >39)
LDL Chol Calc (NIH): 50 mg/dL (ref 0–99)
Triglycerides: 57 mg/dL (ref 0–149)
VLDL Cholesterol Cal: 13 mg/dL (ref 5–40)

## 2022-06-03 LAB — HEPATIC FUNCTION PANEL
ALT: 11 IU/L (ref 0–32)
AST: 20 IU/L (ref 0–40)
Albumin: 4.1 g/dL (ref 3.6–4.6)
Alkaline Phosphatase: 67 IU/L (ref 44–121)
Bilirubin Total: 0.6 mg/dL (ref 0.0–1.2)
Bilirubin, Direct: 0.23 mg/dL (ref 0.00–0.40)
Total Protein: 7.2 g/dL (ref 6.0–8.5)

## 2022-06-03 LAB — CBC
Hematocrit: 36.8 % (ref 34.0–46.6)
Hemoglobin: 11.9 g/dL (ref 11.1–15.9)
MCH: 30.6 pg (ref 26.6–33.0)
MCHC: 32.3 g/dL (ref 31.5–35.7)
MCV: 95 fL (ref 79–97)
Platelets: 131 10*3/uL — ABNORMAL LOW (ref 150–450)
RBC: 3.89 x10E6/uL (ref 3.77–5.28)
RDW: 12.5 % (ref 11.7–15.4)
WBC: 4.2 10*3/uL (ref 3.4–10.8)

## 2022-06-03 LAB — BASIC METABOLIC PANEL
BUN/Creatinine Ratio: 12 (ref 12–28)
BUN: 9 mg/dL — ABNORMAL LOW (ref 10–36)
CO2: 27 mmol/L (ref 20–29)
Calcium: 10 mg/dL (ref 8.7–10.3)
Chloride: 102 mmol/L (ref 96–106)
Creatinine, Ser: 0.73 mg/dL (ref 0.57–1.00)
Glucose: 93 mg/dL (ref 70–99)
Potassium: 4.6 mmol/L (ref 3.5–5.2)
Sodium: 139 mmol/L (ref 134–144)
eGFR: 76 mL/min/{1.73_m2} (ref >59)

## 2022-06-11 ENCOUNTER — Encounter: Payer: Self-pay | Admitting: Internal Medicine

## 2022-06-16 ENCOUNTER — Ambulatory Visit: Payer: Medicare Other | Admitting: Podiatry

## 2022-06-16 DIAGNOSIS — M79674 Pain in right toe(s): Secondary | ICD-10-CM

## 2022-06-16 DIAGNOSIS — M79675 Pain in left toe(s): Secondary | ICD-10-CM

## 2022-06-16 DIAGNOSIS — B351 Tinea unguium: Secondary | ICD-10-CM | POA: Diagnosis not present

## 2022-06-16 DIAGNOSIS — Z7901 Long term (current) use of anticoagulants: Secondary | ICD-10-CM | POA: Diagnosis not present

## 2022-06-16 DIAGNOSIS — L84 Corns and callosities: Secondary | ICD-10-CM

## 2022-06-18 NOTE — Progress Notes (Signed)
Subjective: 88 y.o. returns the office today for painful, elongated, thickened toenails which she cannot trim herself and for calluses. Denies any redness or drainage around the nails/calluses.  No other pain to her feet.  No recent injury.  Denies any open lesions.  She had a toe injury per the chart but she states that has healed.   She is on Xarelto  PCP: Pincus Sanes, MD Last seen 12/27/2021  Objective: NAD- presents with caregiver DP/PT pulses palpable, CRT less than 3 seconds Nails hypertrophic, dystrophic, elongated, brittle, discolored 10. There is tenderness overlying the nails 1-5 on the left and 1-5 on the right. There is no surrounding erythema or drainage along the nail sites. Minimal hyperkeratotic tissue medial first MPJ.  No ulcerations identified No other areas of discomfort to bilateral lower extremities today.  No pain with calf compression, swelling, warmth, erythema.  Assessment: Patient presents with symptomatic onychomycosis  Plan: -Treatment options including alternatives, risks, complications were discussed -Nails sharply debrided x 10 without complication/bleeding. -Moisturizer to help with callus regrowth. -Discussed daily foot inspection. If there are any changes, to call the office immediately.  -Follow-up in 3 months or sooner if any problems are to arise. In the meantime, encouraged to call the office with any questions, concerns, changes symptoms.  Ovid Curd, DPM

## 2022-07-03 ENCOUNTER — Ambulatory Visit (INDEPENDENT_AMBULATORY_CARE_PROVIDER_SITE_OTHER): Payer: Medicare Other | Admitting: *Deleted

## 2022-07-03 DIAGNOSIS — E538 Deficiency of other specified B group vitamins: Secondary | ICD-10-CM

## 2022-07-03 MED ORDER — CYANOCOBALAMIN 1000 MCG/ML IJ SOLN
1000.0000 ug | Freq: Once | INTRAMUSCULAR | Status: AC
  Administered 2022-07-03: 1000 ug via INTRAMUSCULAR

## 2022-07-03 NOTE — Progress Notes (Signed)
Patient here for her b12 injection. Given in right deltoid. Patient tolerated well 

## 2022-07-03 NOTE — Progress Notes (Deleted)
    Subjective:    Patient ID: Grace Alvarado, female    DOB: 06-12-1925, 88 y.o.   MRN: [id number]      HPI [name] is here for No chief complaint on file.   Red, itchy eyes, ? Allergies -      Medications and allergies reviewed with patient and updated if appropriate.  Current Outpatient Medications on File Prior to Visit  Medication Sig Dispense Refill   acetaminophen (TYLENOL) 325 MG tablet Take 1-2 tablets (325-650 mg total) by mouth every 6 (six) hours as needed for mild pain (pain score 1-3 or temp > 100.5).     B Complex-C (B-COMPLEX WITH VITAMIN C) tablet Take 1 tablet by mouth daily.     diltiazem (CARDIZEM SR) 120 MG 12 hr capsule TAKE 1 CAPSULE BY MOUTH 2 TIMES DAILY. 180 capsule 3   furosemide (LASIX) 20 MG tablet Take 1 tablet (20 mg total) by mouth every other day. 90 tablet 3   levothyroxine (SYNTHROID) 75 MCG tablet TAKE 1 TABLET BY MOUTH EVERY DAY BEFORE BREAKFAST 90 tablet 1   polyethylene glycol (MIRALAX) 17 g packet Take 17 g by mouth daily. Hold for loose bowel movements 60 each 0   potassium chloride (KLOR-CON M10) 10 MEQ tablet Take 1 tablet (10 mEq total) by mouth every other day. 90 tablet 3   pravastatin (PRAVACHOL) 10 MG tablet TAKE 1 TABLET BY MOUTH EVERY DAY 90 tablet 3   XARELTO 15 MG TABS tablet TAKE 1 TABLET BY MOUTH EVERY DAY WITH SUPPER 90 tablet 1   No current facility-administered medications on file prior to visit.    Review of Systems     Objective:  There were no vitals filed for this visit. BP Readings from Last 3 Encounters:  05/30/22 (!) 141/77  04/18/22 120/64  12/27/21 112/78   Wt Readings from Last 3 Encounters:  05/30/22 142 lb (64.4 kg)  04/18/22 142 lb (64.4 kg)  12/27/21 137 lb (62.1 kg)   There is no height or weight on file to calculate BMI.    Physical Exam         Assessment & Plan:    See Problem List for Assessment and Plan of chronic medical problems.

## 2022-07-04 ENCOUNTER — Encounter: Payer: Self-pay | Admitting: Internal Medicine

## 2022-07-04 ENCOUNTER — Ambulatory Visit: Payer: Medicare Other | Admitting: Internal Medicine

## 2022-07-07 ENCOUNTER — Ambulatory Visit (INDEPENDENT_AMBULATORY_CARE_PROVIDER_SITE_OTHER): Payer: Medicare Other | Admitting: Internal Medicine

## 2022-07-07 ENCOUNTER — Encounter: Payer: Self-pay | Admitting: Internal Medicine

## 2022-07-07 DIAGNOSIS — I10 Essential (primary) hypertension: Secondary | ICD-10-CM

## 2022-07-07 DIAGNOSIS — H04123 Dry eye syndrome of bilateral lacrimal glands: Secondary | ICD-10-CM | POA: Diagnosis not present

## 2022-07-07 NOTE — Progress Notes (Signed)
Subjective:    Patient ID: Grace Alvarado, female    DOB: Mar 23, 1925, 88 y.o.   MRN: [id number]      HPI Grace Alvarado is here for  Chief Complaint  Patient presents with   Eyes hurt    Both eyes hurt     Both eyes hurt -this started about 3 weeks ago.  When she gets up in the morning she is fine - they both eyes get irritated.  She denies itching, discharge, and pain.   It is annoyring. No redness, no tearing.  She denies any changes in vision.  She has not seen an eye doctor for a few years.  She does have allergies, but not sure if she has any right now.  She has never had eye involvement with her allergies.   Medications and allergies reviewed with patient and updated if appropriate.  Current Outpatient Medications on File Prior to Visit  Medication Sig Dispense Refill   acetaminophen (TYLENOL) 325 MG tablet Take 1-2 tablets (325-650 mg total) by mouth every 6 (six) hours as needed for mild pain (pain score 1-3 or temp > 100.5).     B Complex-C (B-COMPLEX WITH VITAMIN C) tablet Take 1 tablet by mouth daily.     diltiazem (CARDIZEM SR) 120 MG 12 hr capsule TAKE 1 CAPSULE BY MOUTH 2 TIMES DAILY. 180 capsule 3   furosemide (LASIX) 20 MG tablet Take 1 tablet (20 mg total) by mouth every other day. 90 tablet 3   levothyroxine (SYNTHROID) 75 MCG tablet TAKE 1 TABLET BY MOUTH EVERY DAY BEFORE BREAKFAST 90 tablet 1   polyethylene glycol (MIRALAX) 17 g packet Take 17 g by mouth daily. Hold for loose bowel movements 60 each 0   potassium chloride (KLOR-CON M10) 10 MEQ tablet Take 1 tablet (10 mEq total) by mouth every other day. 90 tablet 3   pravastatin (PRAVACHOL) 10 MG tablet TAKE 1 TABLET BY MOUTH EVERY DAY 90 tablet 3   XARELTO 15 MG TABS tablet TAKE 1 TABLET BY MOUTH EVERY DAY WITH SUPPER 90 tablet 1   No current facility-administered medications on file prior to visit.    Review of Systems  Constitutional:  Negative for chills and fever.  HENT:  Negative for congestion.   Eyes:   Negative for photophobia, pain, discharge, redness, itching and visual disturbance.  Neurological:  Negative for dizziness and headaches.       Objective:   Vitals:   07/07/22 1529  BP: 130/68  Pulse: (!) 102  Temp: 98 F (36.7 C)  SpO2: 98%   BP Readings from Last 3 Encounters:  07/07/22 130/68  05/30/22 (!) 141/77  04/18/22 120/64   Wt Readings from Last 3 Encounters:  07/07/22 141 lb 3.2 oz (64 kg)  05/30/22 142 lb (64.4 kg)  04/18/22 142 lb (64.4 kg)   Body mass index is 25.01 kg/m.    Physical Exam Constitutional:      General: She is not in acute distress.    Appearance: Normal appearance. She is not ill-appearing.  Eyes:     General: No scleral icterus.       Right eye: No discharge.        Left eye: No discharge.     Extraocular Movements: Extraocular movements intact.     Conjunctiva/sclera: Conjunctivae normal.     Comments: Bilateral upper and lower eyelids appear normal  Skin:    General: Skin is warm and dry.     Findings: No erythema, lesion  or rash.  Neurological:     Mental Status: She is alert.            Assessment & Plan:    See Problem List for Assessment and Plan of chronic medical problems.

## 2022-07-07 NOTE — Assessment & Plan Note (Signed)
Acute Most likely her symptoms are consistent with dry eyes Doubt allergies since she has never had any allergy symptoms in her eyes and almost 89 years No evidence of infection No concerning symptoms including eye pain or changes in vision Try Visine dry eyes lubricating drops Can try eating a humidifier in your bedroom at night or in her house Advise if symptoms do not improve she should see an eye doctor

## 2022-07-07 NOTE — Assessment & Plan Note (Signed)
Chronic Blood pressure well controlled Continue diltiazem 120 mg twice daily

## 2022-07-07 NOTE — Patient Instructions (Addendum)
You most likely have dry eyes.  Try using vasline dry eye lubricating drops   Dry Eye  Dry eye, also called keratoconjunctivitis sicca, is a condition caused by dryness of the membranes surrounding the eye. It happens when there are not enough healthy, natural tears in the eyes. The eyes must remain moist at all times for good comfort and vision. A small amount of tears is constantly produced by the tear glands (lacrimal glands). These glands are mainly located under the outside part of the upper eyelids. The eyelids produce oils that coat the tears to keep them from evaporating quickly. If the eyelids are inflamed (blepharitis), the lack of healthy oils can make the dry eye worse. Dry eye can happen on its own or be a symptom of several conditions, such as rheumatoid arthritis, lupus, or Sjgren's syndrome. Dry eye may be mild to severe. What are the causes? This condition may be caused by: Not making enough tears (aqueous tear-deficient dry eyes). Tears evaporating from the eyes too quickly (evaporative dry eyes). This is when there is an abnormality in the quality of your tears, especially the oils. This abnormality causes your tears to evaporate so quickly that the eyes cannot be kept moist. What increases the risk? You are more likely to develop this condition if you: Are a woman, especially if you have gone through menopause. Are older. Live in a dry climate. Live or work in a dusty or smoky area. Take certain medicines, such as: Anti-allergy medicines (antihistamines). Blood pressure medicines (antihypertensives), especially "water pills" (diuretics). Birth control pills (oral contraceptives). Laxatives. Tranquilizers. Have eyelid inflammation (blepharitis). Have a history of refractive eye surgery, such as LASIK. Have a history of long-term contact lens use. What are the signs or symptoms? Symptoms of this condition include: Irritation. You may feel: Itchiness. Burning. A  feeling as though something is stuck in the eye. Redness. Inflammation of the eyelids. Light sensitivity. Increased sensitivity and discomfort when wearing contact lenses. Vision that varies throughout the day. Occasional excessive tearing. How is this diagnosed? This condition is diagnosed based on your symptoms, your medical history, and an eye exam. Your health care provider may look at your eye using a microscope and may put dyes in your eye to check the health of the surface of your eye. You may have tests, such as a test to evaluate your tear production (Schirmer test). During this test: A small strip of special paper is gently pressed partly under your lower eyelid. Your tear production is measured by how much of the paper is moistened by your tears during a set amount of time. You may be referred to a health care provider who specializes in medical and surgical eye care (ophthalmologist). How is this treated? Treatment for this condition depends on the type and severity of the dry eyes. To help relieve your symptoms, your health care provider may recommend over-the-counter artificial tears. Artificial tears either come in bottles that have mild preservatives or in small vials or bottles without preservatives. Patients with mild dry eye may do well with tears that have preservatives, while those with more severe dry eye should just use tears without preservatives. Note that one vial may be used several times a day, but should be discarded at the end of the day. If your condition is severe, treatment may also include: Prescription eye drops. Over-the-counter or prescription gels or ointments to moisten your eyes. A prescription nasal spray that increases tear production. Minor surgery to place plugs  into the tear drainage ducts. This keep tears from exiting the eye so that tears can stay on the surface of the eye longer. Medicines to reduce inflammation of the eyelids. Taking an omega-3  fatty acid nutritional supplement. Other treatments include making tears from your own blood (autologous serum tears), wearing special contact lenses, and even having minor surgery to partially close the outer parts of your eyelids to decrease evaporation. Follow these instructions at home: Take or apply over-the-counter and prescription medicines only as told by your health care provider. This includes eye drops. If directed, apply a warm compress to your eyes to help reduce eyelid inflammation. Place a towel over your eyes and gently press the warm compress over your eyes for about 5 minutes, or as long as told by your health care provider. Drink plenty of fluids to stay well hydrated. If possible, avoid dry, drafty environments. Wear sunglasses when outdoors to protect your eyes from the sun and wind. Use a humidifier at home to increase moisture in the air. Remember to blink often when reading or using the computer for long periods. If you wear contact lenses, remove them regularly to give your eyes a break. Always remove your contact lenses before sleeping. Have a yearly eye exam and vision test. Keep all follow-up visits. This is important. Contact a health care provider if: You have eye pain. You have pus-like fluid coming from your eye. Your symptoms get worse or do not improve with treatment. Get help right away if: Your vision suddenly changes. Summary Dry eye is dryness of the membranes surrounding the eye. Dry eye can happen on its own or be a symptom of several conditions, such as rheumatoid arthritis, lupus, or Sjgren's syndrome. This condition is diagnosed based on your symptoms, your medical history, and an eye exam. Treatment for this condition depends on the type and severity of the dry eye. To help relieve your symptoms, your health care provider may recommend over-the-counter artificial tears. This information is not intended to replace advice given to you by your health  care provider. Make sure you discuss any questions you have with your health care provider. Document Revised: 02/12/2021 Document Reviewed: 02/12/2021 Elsevier Patient Education  [employer].

## 2022-07-08 ENCOUNTER — Ambulatory Visit: Payer: Medicare Other | Admitting: Internal Medicine

## 2022-07-10 ENCOUNTER — Other Ambulatory Visit: Payer: Self-pay | Admitting: Internal Medicine

## 2022-07-14 ENCOUNTER — Encounter: Payer: Self-pay | Admitting: Internal Medicine

## 2022-07-17 ENCOUNTER — Encounter: Payer: Self-pay | Admitting: Internal Medicine

## 2022-07-19 ENCOUNTER — Telehealth: Payer: Medicare Other | Admitting: Family Medicine

## 2022-07-19 DIAGNOSIS — R1030 Lower abdominal pain, unspecified: Secondary | ICD-10-CM

## 2022-07-19 NOTE — Patient Instructions (Signed)

## 2022-07-19 NOTE — Progress Notes (Signed)
Because [name], I feel your condition warrants further evaluation and I recommend that you be seen in a face to face visit.   NOTE: There will be NO CHARGE for this eVisit   If you are having a true medical emergency please call 911.      For an urgent face to face visit, [facility] has seven urgent care centers for your convenience:     [facility] [place] [facility] [place] Get Driving Directions [id number] [place], [place] [address]    [facility] Get Driving Directions [id number] [place], [place] [address]  [facility] [place] ([place]) Get Driving Directions [id number] [address] [place],  [place]  [address]  [facility] [address] [facility] Get Driving Directions  [id number] [phone] [address] [place],  [place] [address]   [facility]  Get Driving Directions [id number] [address] [place], [place], [place] [address]   [facility] [place] Get Driving Directions  [id number] [address], [place] [address]   [facility] [place] Get Driving Directions [id number] [address], [place],  [address]  Your MyChart E-visit questionnaire answers were reviewed by a board certified advanced clinical practitioner to complete your personal care plan based on your specific symptoms.  Thank you for using e-Visits.   Virtual Visit Consent   Shiloe Blancett, you are scheduled for a virtual visit with a [facility] provider today. Just as with appointments in the office, your consent must be obtained to participate. Your consent will be active for this visit and any virtual visit you may have with one of our providers in the next 365 days. If you have a MyChart account, a copy of this  consent can be sent to you electronically.  As this is a virtual visit, video technology does not allow for your provider to perform a traditional examination. This may limit your provider's ability to fully assess your condition. If your provider identifies any concerns that need to be evaluated in person or the need to arrange testing (such as labs, EKG, etc.), we will make arrangements to do so. Although advances in technology are sophisticated, we cannot ensure that it will always work on either your end or our end. If the connection with a video visit is poor, the visit may have to be switched to a telephone visit. With either a video or telephone visit, we are not always able to ensure that we have a secure connection.  By engaging in this virtual visit, you consent to the provision of healthcare and authorize for your insurance to be billed (if applicable) for the services provided during this visit. Depending on your insurance coverage, you may receive a charge related to this service.  I need to obtain your verbal consent now. Are you willing to proceed with your visit today? Julianys Alvarado has provided verbal consent on 07/19/2022 for a virtual visit (video or telephone). Dellia Nims, FNP  Date: 07/19/2022 12:54 PM  Virtual Visit via Video Note   I, Dellia Nims, connected with  Grace Alvarado  ([id number], 1924-12-31) on 07/19/22 at 12:45 PM EDT by a video-enabled telemedicine application and verified that I am speaking with the correct person using two identifiers.  Location: Patient: Virtual Visit Location Patient: Home Provider: Virtual Visit  Location Provider: Home Office   I discussed the limitations of evaluation and management by telemedicine and the availability of in person appointments. The patient expressed understanding and agreed to proceed.    History of Present Illness: Grace Alvarado is a 88 y.o. who identifies as a female who was assigned female at birth, and is being seen  today for lower abdominal pain with no dysuria, frequency, urgency, no fever since last night. Her daugher is with her today and is advised to take her to urgent care or the ED. Pt is sitting in a chair with her head in her hands. Marland Kitchen  HPI: HPI  Problems:  Patient Active Problem List   Diagnosis Date Noted   Dry eyes, bilateral 07/07/2022   Toe injury, left, initial encounter 04/18/2022   Hematoma of toe of left foot 04/18/2022   Bilateral leg edema 12/17/2021   Memory changes 10/29/2021   COVID 10/28/2021   Left-sided nosebleed 10/14/2021   Fatigue 08/14/2021   Urinary frequency 08/14/2021   Lumbar radiculopathy 06/25/2021   Rib pain on left side 09/27/2020   LUQ pain 09/17/2020   Pilonidal cyst 09/17/2020   Allergic rhinitis 07/03/2020   Aortic atherosclerosis ([place]) 06/13/2020   DNR (do not resuscitate) 03/07/2020   Prepatellar bursitis, left knee 03/02/2020   Neuropathy 01/24/2020   Chronic back pain 01/24/2020   Compression fracture of T11 vertebra ([facility]) 01/23/2020   Leg pain 01/03/2020   Chronic anticoagulation 01/03/2020   Compression fracture of L1 lumbar vertebra ([place]) 07/27/2019   Low back pain 07/20/2019   Hyperglycemia 05/01/2019   Constipation 11/16/2018   AVM (arteriovenous malformation) of colon    Lower GI bleed 11/06/2018   Rectal bleeding 11/05/2018   Iron deficiency anemia 11/02/2018   B12 deficiency 05/27/2018   Dependent edema 05/25/2018   Pulmonary hypertension, primary ([place]) 05/20/2018   Dyslipidemia 05/20/2018   CHF (congestive heart failure) ([place]) 05/20/2018   Tricuspid regurgitation 05/17/2018   Mitral regurgitation, mod-sev 2020 05/17/2018   Chronic a-fib 05/17/2018   Essential hypertension 05/17/2018   Hypothyroid 05/17/2018   CAD (coronary artery disease) 05/17/2018   Carotid atherosclerosis, bilateral 05/17/2018    Allergies:  Allergies  Allergen Reactions   Codeine Other (See Comments)    unknown   Penicillins Other (See Comments)     unknown   Vancomycin Other (See Comments)    unknown   Zithromax [Azithromycin] Other (See Comments)    unknown   Latex Rash   Medications:  Current Outpatient Medications:    acetaminophen (TYLENOL) 325 MG tablet, Take 1-2 tablets (325-650 mg total) by mouth every 6 (six) hours as needed for mild pain (pain score 1-3 or temp > 100.5)., Disp:  , Rfl:    B Complex-C (B-COMPLEX WITH VITAMIN C) tablet, Take 1 tablet by mouth daily., Disp:  , Rfl:    diltiazem (CARDIZEM SR) 120 MG 12 hr capsule, TAKE 1 CAPSULE BY MOUTH 2 TIMES DAILY., Disp: 180 capsule, Rfl: 3   furosemide (LASIX) 20 MG tablet, Take 1 tablet (20 mg total) by mouth every other day., Disp: 90 tablet, Rfl: 3   levothyroxine (SYNTHROID) 75 MCG tablet, TAKE 1 TABLET BY MOUTH EVERY DAY BEFORE BREAKFAST, Disp: 90 tablet, Rfl: 1   polyethylene glycol (MIRALAX) 17 g packet, Take 17 g by mouth daily. Hold for loose bowel movements, Disp: 60 each, Rfl: 0   potassium chloride (KLOR-CON M10) 10 MEQ tablet, Take 1 tablet (10 mEq total) by mouth every other day., Disp: 90 tablet,  Rfl: 3   pravastatin (PRAVACHOL) 10 MG tablet, TAKE 1 TABLET BY MOUTH EVERY DAY, Disp: 90 tablet, Rfl: 3   XARELTO 15 MG TABS tablet, TAKE 1 TABLET BY MOUTH EVERY DAY WITH SUPPER, Disp: 90 tablet, Rfl: 1  Observations/Objective: Patient is well-developed, well-nourished in no acute distress.  Appears uncomfortable at home.  Head is normocephalic, atraumatic.  No labored breathing.  Speech is clear and coherent with logical content.    Assessment and Plan: 1. Lower abdominal pain  Proceed to urgent care or ED.   Follow Up Instructions: I discussed the assessment and treatment plan with the patient. The patient was provided an opportunity to ask questions and all were answered. The patient agreed with the plan and demonstrated an understanding of the instructions.  A copy of instructions were sent to the patient via MyChart unless otherwise noted below.      The patient was advised to call back or seek an in-person evaluation if the symptoms worsen or if the condition fails to improve as anticipated.  Time:  I spent 8 minutes with the patient via telehealth technology discussing the above problems/concerns.    Dellia Nims, FNP

## 2022-07-20 ENCOUNTER — Encounter: Payer: Self-pay | Admitting: Internal Medicine

## 2022-07-21 ENCOUNTER — Encounter: Payer: Self-pay | Admitting: Internal Medicine

## 2022-07-21 NOTE — Progress Notes (Unsigned)
    Subjective:    Patient ID: Grace Alvarado, female    DOB: January 20, 1925, 88 y.o.   MRN: [id number]      HPI Grace Alvarado is here for No chief complaint on file.    Lower abdominal pain,?  UTI:    Medications and allergies reviewed with patient and updated if appropriate.  Current Outpatient Medications on File Prior to Visit  Medication Sig Dispense Refill   acetaminophen (TYLENOL) 325 MG tablet Take 1-2 tablets (325-650 mg total) by mouth every 6 (six) hours as needed for mild pain (pain score 1-3 or temp > 100.5).     B Complex-C (B-COMPLEX WITH VITAMIN C) tablet Take 1 tablet by mouth daily.     diltiazem (CARDIZEM SR) 120 MG 12 hr capsule TAKE 1 CAPSULE BY MOUTH 2 TIMES DAILY. 180 capsule 3   furosemide (LASIX) 20 MG tablet Take 1 tablet (20 mg total) by mouth every other day. 90 tablet 3   levothyroxine (SYNTHROID) 75 MCG tablet TAKE 1 TABLET BY MOUTH EVERY DAY BEFORE BREAKFAST 90 tablet 1   polyethylene glycol (MIRALAX) 17 g packet Take 17 g by mouth daily. Hold for loose bowel movements 60 each 0   potassium chloride (KLOR-CON M10) 10 MEQ tablet Take 1 tablet (10 mEq total) by mouth every other day. 90 tablet 3   pravastatin (PRAVACHOL) 10 MG tablet TAKE 1 TABLET BY MOUTH EVERY DAY 90 tablet 3   XARELTO 15 MG TABS tablet TAKE 1 TABLET BY MOUTH EVERY DAY WITH SUPPER 90 tablet 1   No current facility-administered medications on file prior to visit.    Review of Systems     Objective:  There were no vitals filed for this visit. BP Readings from Last 3 Encounters:  07/07/22 130/68  05/30/22 (!) 141/77  04/18/22 120/64   Wt Readings from Last 3 Encounters:  07/07/22 141 lb 3.2 oz (64 kg)  05/30/22 142 lb (64.4 kg)  04/18/22 142 lb (64.4 kg)   There is no height or weight on file to calculate BMI.    Physical Exam         Assessment & Plan:    See Problem List for Assessment and Plan of chronic medical problems.

## 2022-07-22 ENCOUNTER — Ambulatory Visit: Payer: Medicare Other | Admitting: Internal Medicine

## 2022-07-22 ENCOUNTER — Encounter: Payer: Self-pay | Admitting: Internal Medicine

## 2022-07-22 VITALS — BP 130/74 | HR 77 | Temp 98.0°F | Ht 63.0 in | Wt 141.0 lb

## 2022-07-22 DIAGNOSIS — R300 Dysuria: Secondary | ICD-10-CM | POA: Diagnosis not present

## 2022-07-22 DIAGNOSIS — S51811A Laceration without foreign body of right forearm, initial encounter: Secondary | ICD-10-CM | POA: Insufficient documentation

## 2022-07-22 DIAGNOSIS — N3001 Acute cystitis with hematuria: Secondary | ICD-10-CM | POA: Diagnosis not present

## 2022-07-22 LAB — POC URINALSYSI DIPSTICK (AUTOMATED)
Bilirubin, UA: 1
Glucose, UA: NEGATIVE
Ketones, UA: NEGATIVE
Leukocytes, UA: NEGATIVE
Nitrite, UA: NEGATIVE
Protein, UA: POSITIVE — AB
Spec Grav, UA: 1.015 (ref 1.010–1.025)
Urobilinogen, UA: 0.2 E.U./dL
pH, UA: 6 (ref 5.0–8.0)

## 2022-07-22 MED ORDER — NITROFURANTOIN MONOHYD MACRO 100 MG PO CAPS: 100.0000 mg | ORAL_CAPSULE | Freq: Two times a day (BID) | ORAL | 0 refills | Status: DC

## 2022-07-22 NOTE — Patient Instructions (Signed)
Medications changes include :   start nitrofurantoin twice daily for probable infection in your urine.  Urine culture will be back in two days.     Take the antibiotic as prescribed.  Take tylenol if needed.     Increase your water intake.   Call if no improvement     Urinary Tract Infection, Adult A urinary tract infection (UTI) is an infection of any part of the urinary tract, which includes the kidneys, ureters, bladder, and urethra. These organs make, store, and get rid of urine in the body. UTI can be a bladder infection (cystitis) or kidney infection (pyelonephritis). What are the causes? This infection may be caused by fungi, viruses, or bacteria. Bacteria are the most common cause of UTIs. This condition can also be caused by repeated incomplete emptying of the bladder during urination. What increases the risk? This condition is more likely to develop if: You ignore your need to urinate or hold urine for long periods of time. You do not empty your bladder completely during urination. You wipe back to front after urinating or having a bowel movement, if you are female. You are uncircumcised, if you are female. You are constipated. You have a urinary catheter that stays in place (indwelling). You have a weak defense (immune) system. You have a medical condition that affects your bowels, kidneys, or bladder. You have diabetes. You take antibiotic medicines frequently or for long periods of time, and the antibiotics no longer work well against certain types of infections (antibiotic resistance). You take medicines that irritate your urinary tract. You are exposed to chemicals that irritate your urinary tract. You are female.  What are the signs or symptoms? Symptoms of this condition include: Fever. Frequent urination or passing small amounts of urine frequently. Needing to urinate urgently. Pain or burning with urination. Urine that smells bad or unusual. Cloudy  urine. Pain in the lower abdomen or back. Trouble urinating. Blood in the urine. Vomiting or being less hungry than normal. Diarrhea or abdominal pain. Vaginal discharge, if you are female.  How is this diagnosed? This condition is diagnosed with a medical history and physical exam. You will also need to provide a urine sample to test your urine. Other tests may be done, including: Blood tests. Sexually transmitted disease (STD) testing.  If you have had more than one UTI, a cystoscopy or imaging studies may be done to determine the cause of the infections. How is this treated? Treatment for this condition often includes a combination of two or more of the following: Antibiotic medicine. Other medicines to treat less common causes of UTI. Over-the-counter medicines to treat pain. Drinking enough water to stay hydrated.  Follow these instructions at home: Take over-the-counter and prescription medicines only as told by your health care provider. If you were prescribed an antibiotic, take it as told by your health care provider. Do not stop taking the antibiotic even if you start to feel better. Avoid alcohol, caffeine, tea, and carbonated beverages. They can irritate your bladder. Drink enough fluid to keep your urine clear or pale yellow. Keep all follow-up visits as told by your health care provider. This is important. Make sure to: Empty your bladder often and completely. Do not hold urine for long periods of time. Empty your bladder before and after sex. Wipe from front to back after a bowel movement if you are female. Use each tissue one time when you wipe. Contact a health care provider if:  You have back pain. You have a fever. You feel nauseous or vomit. Your symptoms do not get better after 3 days. Your symptoms go away and then return. Get help right away if: You have severe back pain or lower abdominal pain. You are vomiting and cannot keep down any medicines or  water. This information is not intended to replace advice given to you by your health care provider. Make sure you discuss any questions you have with your health care provider. Document Released: 07/02/2005 Document Revised: 03/05/2016 Document Reviewed: 08/13/2015 Elsevier Interactive Patient Education  [employer].

## 2022-07-22 NOTE — Assessment & Plan Note (Signed)
Acute Having tenderness in lower abdomen Testing of urine at home was positive for possible UTI Urine dip here with protein and blood, but no leukocytes or nitrates Has a history of UTI-last one was 2 years ago Will empirically treat with nitrofurantoin since this will treat the 2 different bacteria she had in 2021 Send urine for culture She will let me know if her symptoms do not improve or worsen

## 2022-07-22 NOTE — Assessment & Plan Note (Signed)
Acute Healing without signs of infection Continue keeping area covered and applying antibacterial ointment

## 2022-07-25 ENCOUNTER — Encounter: Payer: Self-pay | Admitting: Internal Medicine

## 2022-07-25 LAB — CULTURE, URINE COMPREHENSIVE

## 2022-07-28 ENCOUNTER — Other Ambulatory Visit: Payer: Self-pay | Admitting: Cardiology

## 2022-07-28 DIAGNOSIS — Z7901 Long term (current) use of anticoagulants: Secondary | ICD-10-CM

## 2022-07-28 DIAGNOSIS — I482 Chronic atrial fibrillation, unspecified: Secondary | ICD-10-CM

## 2022-07-28 NOTE — Telephone Encounter (Signed)
Prescription refill request for Xarelto received.  Indication: Afib  Last office visit:05/30/22 ([facility]) Weight: 64kg Age: 88 Scr: 0.73 (06/02/22)  CrCl: 46.4ml/min  Appropriate dose and refill sent to requested pharmacy.

## 2022-07-30 ENCOUNTER — Encounter: Payer: Self-pay | Admitting: Internal Medicine

## 2022-08-07 ENCOUNTER — Ambulatory Visit: Payer: Medicare Other

## 2022-08-08 ENCOUNTER — Other Ambulatory Visit: Payer: Self-pay | Admitting: Cardiology

## 2022-08-14 ENCOUNTER — Ambulatory Visit (INDEPENDENT_AMBULATORY_CARE_PROVIDER_SITE_OTHER): Payer: Medicare Other

## 2022-08-14 DIAGNOSIS — E538 Deficiency of other specified B group vitamins: Secondary | ICD-10-CM | POA: Diagnosis not present

## 2022-08-14 MED ORDER — CYANOCOBALAMIN 1000 MCG/ML IJ SOLN
1000.0000 ug | Freq: Once | INTRAMUSCULAR | Status: AC
  Administered 2022-08-14: 1000 ug via INTRAMUSCULAR

## 2022-08-14 NOTE — Progress Notes (Addendum)
Patient here for monthly B12 injection per Dr. Burns. B12 1000 mcg given left IM and patient tolerated injection well today.  

## 2022-09-15 ENCOUNTER — Ambulatory Visit (INDEPENDENT_AMBULATORY_CARE_PROVIDER_SITE_OTHER): Payer: Medicare Other | Admitting: Podiatry

## 2022-09-15 DIAGNOSIS — B351 Tinea unguium: Secondary | ICD-10-CM

## 2022-09-15 DIAGNOSIS — M79675 Pain in left toe(s): Secondary | ICD-10-CM

## 2022-09-15 DIAGNOSIS — Z7901 Long term (current) use of anticoagulants: Secondary | ICD-10-CM

## 2022-09-15 DIAGNOSIS — M79674 Pain in right toe(s): Secondary | ICD-10-CM

## 2022-09-15 NOTE — Progress Notes (Signed)
Subjective: Chief Complaint  Patient presents with   Nail Problem    Routine foot care, nail trim, callus left foot, patient has stubbed the right foot 2nd and 3rd toes, patient denies any pain, numbness    88 y.o. returns the office today for painful, elongated, thickened toenails which she cannot trim herself and for calluses.  She stubbed her toes on the right foot about a month ago.  Currently no pain, bruising or any swelling.  She able to walk without pain.  She is on Xarelto  PCP: Pincus Sanes, MD Last seen July 22, 2022  Objective: NAD- presents with caregiver DP/PT pulses palpable, CRT less than 3 seconds Nails hypertrophic, dystrophic, elongated, brittle, discolored 10. There is tenderness overlying the nails 1-5 on the left and 1-5 on the right. There is no surrounding erythema or drainage along the nail sites. Mild pitting edema present bilaterally. She bruises stubbed her toes on the right foot and not able to appreciate any tenderness today.  There is no edema, erythema measures. No other areas of discomfort to bilateral lower extremities today.  No pain with calf compression, swelling, warmth, erythema.  Assessment: Patient presents with symptomatic onychomycosis  Plan: -Treatment options including alternatives, risks, complications were discussed -Nails sharply debrided x 10 without complication/bleeding. -Regarding stubbing her toe she currently has no pain.  Offered x-ray today.  Monitor. -Discussed daily foot inspection. If there are any changes, to call the office immediately.  -Follow-up in 3 months or sooner if any problems are to arise. In the meantime, encouraged to call the office with any questions, concerns, changes symptoms.  Ovid Curd, DPM

## 2022-09-17 ENCOUNTER — Encounter: Payer: Self-pay | Admitting: Internal Medicine

## 2022-09-18 ENCOUNTER — Ambulatory Visit: Payer: Medicare Other | Admitting: Podiatry

## 2022-09-18 ENCOUNTER — Encounter: Payer: Self-pay | Admitting: Internal Medicine

## 2022-09-18 ENCOUNTER — Ambulatory Visit (INDEPENDENT_AMBULATORY_CARE_PROVIDER_SITE_OTHER): Payer: Medicare Other | Admitting: *Deleted

## 2022-09-18 DIAGNOSIS — N644 Mastodynia: Secondary | ICD-10-CM | POA: Insufficient documentation

## 2022-09-18 DIAGNOSIS — E538 Deficiency of other specified B group vitamins: Secondary | ICD-10-CM | POA: Diagnosis not present

## 2022-09-18 MED ORDER — CYANOCOBALAMIN 1000 MCG/ML IJ SOLN
1000.0000 ug | Freq: Once | INTRAMUSCULAR | Status: AC
  Administered 2022-09-18: 1000 ug via INTRAMUSCULAR

## 2022-09-18 NOTE — Progress Notes (Signed)
Subjective:    Patient ID: Grace Alvarado, female    DOB: 1925-05-19, 88 y.o.   MRN: [id number]      HPI Samya is here for  Chief Complaint  Patient presents with   Joint Swelling    Left ankle    Breast Pain    Left breast pain.    She is here with her daughter.   Left rib pain -has been experiencing left-sided rib pain for a while.  She has had this in the past and has seen sports medicine, had chest x-rays and imaging there was no obvious cause.  She denies any increase in her chronic back pain.  She does have chronic constipation and takes Metamucil daily-denies any increase in constipation and feels her bowels have been regular.    Ankle swelling - she is taking lasix 20 mg QOD.  Increased anke swelling about one week.  She is 2 lbs heavier.  She elevates her legs during the day.  The swelling goes down over night.  No change in salt intake.  She denies any shortness of breath.    Medications and allergies reviewed with patient and updated if appropriate.  Current Outpatient Medications on File Prior to Visit  Medication Sig Dispense Refill   acetaminophen (TYLENOL) 325 MG tablet Take 1-2 tablets (325-650 mg total) by mouth every 6 (six) hours as needed for mild pain (pain score 1-3 or temp > 100.5).     B Complex-C (B-COMPLEX WITH VITAMIN C) tablet Take 1 tablet by mouth daily.     diltiazem (CARDIZEM SR) 120 MG 12 hr capsule TAKE 1 CAPSULE BY MOUTH 2 TIMES DAILY. 180 capsule 3   furosemide (LASIX) 20 MG tablet Take 1 tablet (20 mg total) by mouth every other day. 90 tablet 3   levothyroxine (SYNTHROID) 75 MCG tablet TAKE 1 TABLET BY MOUTH EVERY DAY BEFORE BREAKFAST 90 tablet 1   nitrofurantoin, macrocrystal-monohydrate, (MACROBID) 100 MG capsule Take 1 capsule (100 mg total) by mouth 2 (two) times daily. 14 capsule 0   polyethylene glycol (MIRALAX) 17 g packet Take 17 g by mouth daily. Hold for loose bowel movements 60 each 0   potassium chloride (KLOR-CON M10) 10 MEQ  tablet Take 1 tablet (10 mEq total) by mouth every other day. 90 tablet 3   pravastatin (PRAVACHOL) 10 MG tablet TAKE 1 TABLET BY MOUTH EVERY DAY 90 tablet 3   XARELTO 15 MG TABS tablet TAKE 1 TABLET BY MOUTH EVERY DAY WITH SUPPER 90 tablet 1   No current facility-administered medications on file prior to visit.    Review of Systems  Constitutional:  Negative for fever.  Respiratory:  Negative for cough, shortness of breath and wheezing.   Cardiovascular:  Positive for leg swelling. Negative for chest pain and palpitations.       Objective:   Vitals:   09/19/22 1116 09/19/22 1140  BP: 118/78 126/78  Pulse: 90   Temp: 98 F (36.7 C)   SpO2: 98%    BP Readings from Last 3 Encounters:  09/19/22 126/78  07/22/22 130/74  07/07/22 130/68   Wt Readings from Last 3 Encounters:  09/19/22 142 lb 3.2 oz (64.5 kg)  07/22/22 141 lb (64 kg)  07/07/22 141 lb 3.2 oz (64 kg)   Body mass index is 25.19 kg/m.    Physical Exam Constitutional:      General: She is not in acute distress.    Appearance: Normal appearance.  HENT:  Head: Normocephalic and atraumatic.  Eyes:     Conjunctiva/sclera: Conjunctivae normal.  Cardiovascular:     Rate and Rhythm: Normal rate. Rhythm irregular.     Heart sounds: Normal heart sounds. No murmur heard. Pulmonary:     Effort: Pulmonary effort is normal. No respiratory distress.     Breath sounds: Normal breath sounds. No wheezing.  Abdominal:     General: There is no distension.     Palpations: Abdomen is soft.     Tenderness: There is no abdominal tenderness.  Musculoskeletal:        General: Tenderness (Tenderness palpation left lower rib cage-along the lower border medially and couple of other localized areas) present.     Cervical back: Neck supple.     Right lower leg: Edema present.     Left lower leg: Edema present.  Lymphadenopathy:     Cervical: No cervical adenopathy.  Skin:    General: Skin is warm and dry.     Findings: No  rash.  Neurological:     Mental Status: She is alert. Mental status is at baseline.  Psychiatric:        Mood and Affect: Mood normal.        Behavior: Behavior normal.            Assessment & Plan:    See Problem List for Assessment and Plan of chronic medical problems.

## 2022-09-18 NOTE — Progress Notes (Signed)
Patient is here to get her B12 injection. Given in right deltoid. Patient tolerated well

## 2022-09-19 ENCOUNTER — Ambulatory Visit: Payer: Medicare Other | Admitting: Internal Medicine

## 2022-09-19 VITALS — BP 126/78 | HR 90 | Temp 98.0°F | Ht 63.0 in | Wt 142.2 lb

## 2022-09-19 DIAGNOSIS — R0781 Pleurodynia: Secondary | ICD-10-CM

## 2022-09-19 DIAGNOSIS — I10 Essential (primary) hypertension: Secondary | ICD-10-CM | POA: Diagnosis not present

## 2022-09-19 DIAGNOSIS — R600 Localized edema: Secondary | ICD-10-CM

## 2022-09-19 DIAGNOSIS — N644 Mastodynia: Secondary | ICD-10-CM

## 2022-09-19 NOTE — Assessment & Plan Note (Signed)
Acute on chronic Chronic leg edema-worse for about 1 week 2 pound weight gain also noted No shortness of breath to suggest heart cause No change in salt intake She does elevate her legs during the day The only option to help with her leg swelling is to increase the Lasix, but the problem with that is that it does make her dehydrated and not feel well She will try to decrease salt intake a little and be even more consistent with elevating the legs-hopefully the leg swelling will improve on its own If not she may need to take 1/2-1 extra pill of the Lasix For now continue Lasix 20 mg every other day Okay to approach this conservatively since she is not having any shortness of breath or other concerning symptoms

## 2022-09-19 NOTE — Assessment & Plan Note (Signed)
Chronic Blood pressure adequately controlled Continue diltiazem 120 mg twice daily which is more for rate control BP

## 2022-09-19 NOTE — Patient Instructions (Addendum)
     Leg swelling  - looks increased.  Decrease salt intake.  Elevate legs as much as possible.  If weight increases further or does not increase take and extra 1/2-1 lasix.     Left lower rib pain - ? Likely musculoskeletal - rib cage is tender.  Try tylenol, heat.  Continue your metamucil daily.      Return if symptoms worsen or fail to improve.

## 2022-09-19 NOTE — Assessment & Plan Note (Signed)
Acute Has had this before in the past She does have some tenderness in a couple of different areas in the left rib cage-lower aspect No obvious change in bowels and no discrete left upper quadrant tenderness to suggest constipation Rib pain seems to be muscular skeletal-could be related to thoracic spine or inflamed cartilage in the rib cage She has had imaging before in the past and has had this a couple of times previously-I do not think this is anything serious Tried Tylenol, heat Can apply anything topically

## 2022-09-21 ENCOUNTER — Encounter: Payer: Self-pay | Admitting: Internal Medicine

## 2022-10-05 ENCOUNTER — Encounter: Payer: Self-pay | Admitting: Internal Medicine

## 2022-10-06 ENCOUNTER — Ambulatory Visit (HOSPITAL_COMMUNITY): Payer: Medicare Other

## 2022-10-07 ENCOUNTER — Encounter: Payer: Self-pay | Admitting: Internal Medicine

## 2022-10-07 ENCOUNTER — Ambulatory Visit: Payer: Medicare Other | Admitting: Internal Medicine

## 2022-10-07 VITALS — BP 110/74 | HR 96 | Temp 97.9°F | Ht 63.0 in | Wt 143.4 lb

## 2022-10-07 DIAGNOSIS — I10 Essential (primary) hypertension: Secondary | ICD-10-CM | POA: Diagnosis not present

## 2022-10-07 DIAGNOSIS — J069 Acute upper respiratory infection, unspecified: Secondary | ICD-10-CM

## 2022-10-07 DIAGNOSIS — R600 Localized edema: Secondary | ICD-10-CM | POA: Diagnosis not present

## 2022-10-07 LAB — BASIC METABOLIC PANEL
BUN: 9 mg/dL (ref 6–23)
CO2: 29 mEq/L (ref 19–32)
Calcium: 9.8 mg/dL (ref 8.4–10.5)
Chloride: 101 mEq/L (ref 96–112)
Creatinine, Ser: 0.66 mg/dL (ref 0.40–1.20)
GFR: 74.67 mL/min (ref >60.00)
Glucose, Bld: 88 mg/dL (ref 70–99)
Potassium: 3.7 mEq/L (ref 3.5–5.1)
Sodium: 139 mEq/L (ref 135–145)

## 2022-10-07 NOTE — Progress Notes (Signed)
Subjective:    Patient ID: Grace Alvarado, female    DOB: 1924-10-30, 89 y.o.   MRN: [id number]      HPI Grace Alvarado is here for  Chief Complaint  Patient presents with   Leg Swelling    Left leg swelling (left foot swelling)   She is here with her daughter.  Left foot swelling, redness, ? Cellulitis -  2 days ago when her daughter noticed that her left leg was more swollen than usual -- has chronic swelling.  She had a small cut on the lateral aspect of her left leg.  There is some mild redness in the left leg and they were concerned that may indicate infection.  He has been taking his Lasix 20 mg every day instead of every other day and she is tolerating it well.  The swelling has improved overall, but still has swelling.  She is elevating her legs.  URI: Has been experiencing recent cold symptoms and overall mild.  She states nasal congestion, postnasal drip, sore throat.  She denies any cough, wheeze, shortness of breath or fever.    Medications and allergies reviewed with patient and updated if appropriate.  Current Outpatient Medications on File Prior to Visit  Medication Sig Dispense Refill   acetaminophen (TYLENOL) 325 MG tablet Take 1-2 tablets (325-650 mg total) by mouth every 6 (six) hours as needed for mild pain (pain score 1-3 or temp > 100.5).     B Complex-C (B-COMPLEX WITH VITAMIN C) tablet Take 1 tablet by mouth daily.     diltiazem (CARDIZEM SR) 120 MG 12 hr capsule TAKE 1 CAPSULE BY MOUTH 2 TIMES DAILY. 180 capsule 3   furosemide (LASIX) 20 MG tablet Take 1 tablet (20 mg total) by mouth every other day. 90 tablet 3   levothyroxine (SYNTHROID) 75 MCG tablet TAKE 1 TABLET BY MOUTH EVERY DAY BEFORE BREAKFAST 90 tablet 1   polyethylene glycol (MIRALAX) 17 g packet Take 17 g by mouth daily. Hold for loose bowel movements 60 each 0   potassium chloride (KLOR-CON M10) 10 MEQ tablet Take 1 tablet (10 mEq total) by mouth every other day. 90 tablet 3   pravastatin (PRAVACHOL)  10 MG tablet TAKE 1 TABLET BY MOUTH EVERY DAY 90 tablet 3   XARELTO 15 MG TABS tablet TAKE 1 TABLET BY MOUTH EVERY DAY WITH SUPPER 90 tablet 1   No current facility-administered medications on file prior to visit.    Review of Systems  Constitutional:  Negative for fever.  HENT:  Positive for congestion, postnasal drip and sore throat. Negative for sinus pain.   Respiratory:  Negative for cough, shortness of breath and wheezing.   Cardiovascular:  Positive for leg swelling.  Skin:  Positive for color change.       Objective:   Vitals:   10/07/22 1442  BP: 110/74  Pulse: 96  Temp: 97.9 F (36.6 C)  SpO2: 99%   BP Readings from Last 3 Encounters:  10/07/22 110/74  09/19/22 126/78  07/22/22 130/74   Wt Readings from Last 3 Encounters:  10/07/22 143 lb 6.4 oz (65 kg)  09/19/22 142 lb 3.2 oz (64.5 kg)  07/22/22 141 lb (64 kg)   Body mass index is 25.4 kg/m.    Physical Exam Constitutional:      General: She is not in acute distress.    Appearance: Normal appearance. She is not ill-appearing.  HENT:     Head: Normocephalic and atraumatic.  Mouth/Throat:     Mouth: Mucous membranes are moist.     Pharynx: No oropharyngeal exudate or posterior oropharyngeal erythema.  Eyes:     Conjunctiva/sclera: Conjunctivae normal.  Cardiovascular:     Rate and Rhythm: Normal rate and regular rhythm.     Heart sounds: Murmur heard.  Pulmonary:     Effort: Pulmonary effort is normal. No respiratory distress.     Breath sounds: No wheezing or rales.  Musculoskeletal:     Cervical back: Neck supple. No rigidity.     Right lower leg: Edema (Mild halfway up right lower leg nonpitting) present.     Left lower leg: Edema (Minimally pitting just more than half plan left lower leg-worse than right) present.  Lymphadenopathy:     Cervical: No cervical adenopathy.  Skin:    General: Skin is warm and dry.     Findings: Erythema (Mild erythema lower left leg-not warm) present.      Comments: Healed/scabbed over small laceration left mid lateral calf with minimal surrounding erythema that appears normal for aging  Neurological:     Mental Status: She is alert.            Assessment & Plan:    See Problem List for Assessment and Plan of chronic medical problems.

## 2022-10-07 NOTE — Assessment & Plan Note (Signed)
Chronic BP well controlled Continue diltiazem 120 mg bid bmp

## 2022-10-07 NOTE — Assessment & Plan Note (Signed)
Acute Symptoms likely viral in nature Continue symptomatic treatment with over-the-counter cold medications, Tylenol/ibuprofen Increase rest and fluids Call if symptoms worsen or do not improve  

## 2022-10-07 NOTE — Assessment & Plan Note (Signed)
Chronic Improved with daily lasix which she has tolerated well Can try to go back to lasix every other day - but if swelling increases may need to resume lasix 20 mg daily BMP today Redness in left lower leg - not warm or tender - seems to improve in am and worse as swelling worsens during day  - likely related to swelling and not cellulitis - not abx -- monitor closely Continue to elevate legs, walk in apt as much as possible

## 2022-10-07 NOTE — Patient Instructions (Addendum)
     No evidence of cellulitis - the mild redness looks like it is just from the swelling.  First thing in the morning there should be no to minimal redness - it may get worse as the day progresses because the swelling gets worse.  Monitor closely.  Continue to elevate your legs.     Blood work was ordered.   The lab is on the first floor.    Medications changes include :   go back to lasix every other day - if swelling increases again increase back to daily.

## 2022-10-09 ENCOUNTER — Encounter: Payer: Self-pay | Admitting: Internal Medicine

## 2022-10-09 MED ORDER — BENZONATATE 100 MG PO CAPS: 100.0000 mg | ORAL_CAPSULE | Freq: Two times a day (BID) | ORAL | 0 refills | Status: DC | PRN

## 2022-10-09 MED ORDER — MOLNUPIRAVIR 200 MG PO CAPS: 4.0000 | ORAL_CAPSULE | Freq: Two times a day (BID) | ORAL | 0 refills | Status: AC

## 2022-10-20 ENCOUNTER — Ambulatory Visit: Payer: Medicare Other

## 2022-10-21 ENCOUNTER — Encounter: Payer: Self-pay | Admitting: Internal Medicine

## 2022-10-22 ENCOUNTER — Encounter: Payer: Self-pay | Admitting: Internal Medicine

## 2022-10-22 ENCOUNTER — Ambulatory Visit: Payer: Medicare Other

## 2022-10-22 ENCOUNTER — Ambulatory Visit: Payer: Medicare Other | Admitting: Internal Medicine

## 2022-10-22 VITALS — BP 132/62 | HR 70 | Temp 97.9°F | Ht 63.0 in | Wt 144.0 lb

## 2022-10-22 DIAGNOSIS — E038 Other specified hypothyroidism: Secondary | ICD-10-CM

## 2022-10-22 DIAGNOSIS — E538 Deficiency of other specified B group vitamins: Secondary | ICD-10-CM | POA: Diagnosis not present

## 2022-10-22 DIAGNOSIS — I340 Nonrheumatic mitral (valve) insufficiency: Secondary | ICD-10-CM

## 2022-10-22 DIAGNOSIS — I10 Essential (primary) hypertension: Secondary | ICD-10-CM

## 2022-10-22 DIAGNOSIS — I482 Chronic atrial fibrillation, unspecified: Secondary | ICD-10-CM

## 2022-10-22 DIAGNOSIS — R600 Localized edema: Secondary | ICD-10-CM

## 2022-10-22 LAB — CBC WITH DIFFERENTIAL/PLATELET
Basophils Absolute: 0 10*3/uL (ref 0.0–0.1)
Basophils Relative: 0.6 % (ref 0.0–3.0)
Eosinophils Absolute: 0.1 10*3/uL (ref 0.0–0.7)
Eosinophils Relative: 0.8 % (ref 0.0–5.0)
HCT: 36 % (ref 36.0–46.0)
Hemoglobin: 12.5 g/dL (ref 12.0–15.0)
Lymphocytes Relative: 20.3 % (ref 12.0–46.0)
Lymphs Abs: 1.3 10*3/uL (ref 0.7–4.0)
MCHC: 34.6 g/dL (ref 30.0–36.0)
MCV: 95.2 fl (ref 78.0–100.0)
Monocytes Absolute: 0.6 10*3/uL (ref 0.1–1.0)
Monocytes Relative: 9.1 % (ref 3.0–12.0)
Neutro Abs: 4.5 10*3/uL (ref 1.4–7.7)
Neutrophils Relative %: 69.2 % (ref 43.0–77.0)
Platelets: 170 10*3/uL (ref 150.0–400.0)
RBC: 3.79 Mil/uL — ABNORMAL LOW (ref 3.87–5.11)
RDW: 14.3 % (ref 11.5–15.5)
WBC: 6.4 10*3/uL (ref 4.0–10.5)

## 2022-10-22 LAB — LIPID PANEL
Cholesterol: 130 mg/dL (ref 0–200)
HDL: 64.5 mg/dL (ref >39.00)
LDL Cholesterol: 54 mg/dL (ref 0–99)
NonHDL: 65.28
Total CHOL/HDL Ratio: 2
Triglycerides: 57 mg/dL (ref 0.0–149.0)
VLDL: 11.4 mg/dL (ref 0.0–40.0)

## 2022-10-22 LAB — COMPREHENSIVE METABOLIC PANEL
ALT: 10 U/L (ref 0–35)
AST: 16 U/L (ref 0–37)
Albumin: 4.2 g/dL (ref 3.5–5.2)
Alkaline Phosphatase: 73 U/L (ref 39–117)
BUN: 8 mg/dL (ref 6–23)
CO2: 29 mEq/L (ref 19–32)
Calcium: 10 mg/dL (ref 8.4–10.5)
Chloride: 99 mEq/L (ref 96–112)
Creatinine, Ser: 0.67 mg/dL (ref 0.40–1.20)
GFR: 74.38 mL/min (ref >60.00)
Glucose, Bld: 93 mg/dL (ref 70–99)
Potassium: 4.3 mEq/L (ref 3.5–5.1)
Sodium: 135 mEq/L (ref 135–145)
Total Bilirubin: 0.8 mg/dL (ref 0.2–1.2)
Total Protein: 7.9 g/dL (ref 6.0–8.3)

## 2022-10-22 LAB — VITAMIN B12: Vitamin B-12: >1500 pg/mL — ABNORMAL HIGH (ref 211–911)

## 2022-10-22 LAB — BRAIN NATRIURETIC PEPTIDE: Pro B Natriuretic peptide (BNP): 380 pg/mL — ABNORMAL HIGH (ref 0.0–100.0)

## 2022-10-22 LAB — TSH: TSH: 4.01 u[IU]/mL (ref 0.35–5.50)

## 2022-10-22 MED ORDER — CYANOCOBALAMIN 1000 MCG/ML IJ SOLN
1000.0000 ug | Freq: Once | INTRAMUSCULAR | Status: AC
  Administered 2022-10-22: 1000 ug via INTRAMUSCULAR

## 2022-10-22 MED ORDER — FUROSEMIDE 20 MG PO TABS: 20.0000 mg | ORAL_TABLET | Freq: Every day | ORAL | 3 refills | Status: DC

## 2022-10-22 MED ORDER — POTASSIUM CHLORIDE CRYS ER 10 MEQ PO TBCR: 10.0000 meq | EXTENDED_RELEASE_TABLET | Freq: Every day | ORAL | 3 refills | Status: DC

## 2022-10-22 NOTE — Assessment & Plan Note (Signed)
Chronic Likely multifactorial Did well with Lasix 20 mg daily, but when went back to every other day swelling increased again-resume 20 mg daily and continue that Continue potassium chloride daily No shortness of breath, chest pain, palpitations or lightheadedness She is in chronic A-fib and had severe MR-?  These contributing to fluid retention BNP, CMP, CBC Echo ordered Will schedule routine follow-up with Dr. Stanford Breed

## 2022-10-22 NOTE — Assessment & Plan Note (Signed)
Chronic Asymptomatic Heart rate controlled here today Continue Xarelto 15 mg daily, diltiazem 120 mg twice daily

## 2022-10-22 NOTE — Assessment & Plan Note (Signed)
Chronic Monthly B12 injections-B12 injection today Check level

## 2022-10-22 NOTE — Patient Instructions (Addendum)
    Decrease the sodium intake if possible   Blood pressure is good.   Blood work was ordered.   The lab is on the first floor.  An Echocardiogram was ordered.   Medications changes include :   increase the lasix (water pill) to daily.     Follow up with Dr Stanford Breed    Return in about 4 months (around 02/20/2023).

## 2022-10-22 NOTE — Assessment & Plan Note (Signed)
Chronic Blood pressure today well-controlled Continue diltiazem 120 mg twice daily CMP

## 2022-10-22 NOTE — Assessment & Plan Note (Signed)
Chronic  Clinically euthyroid Check tsh and will titrate med dose if needed Currently taking levothyroxine 75 mcg daily  

## 2022-10-22 NOTE — Assessment & Plan Note (Signed)
Chronic Severe MR-does not want surgery No increasing shortness of breath Having increased leg swelling Due for cardiology follow-up-her daughter will call to make an appointment Due for echocardiogram-will order

## 2022-10-22 NOTE — Progress Notes (Signed)
Subjective:    Patient ID: Grace Alvarado, female    DOB: 10-18-1924, 89 y.o.   MRN: [id number]      HPI Grace Alvarado is here for  Chief Complaint  Patient presents with   Leg Swelling    Left leg swelling    Still with leg swelling.  Concern over blood pressure.  After last visit she did take the Lasix for about a week and the leg swelling did improve.  She then went back to every other day and the legs are swollen again.  Both legs are swollen, but left leg is more swollen and it always has been.  She is walking less distance when she walks in the hallway, but she states that is because her legs feel tired and ache.  She denies any shortness of breath.  She is no issues lying in bed-typically sleeps on her right side.   No change in diet-typically eats TV dinners at night, but this is unchanged for years.  No fevers.  No other concerning symptoms.   Medications and allergies reviewed with patient and updated if appropriate.  Current Outpatient Medications on File Prior to Visit  Medication Sig Dispense Refill   acetaminophen (TYLENOL) 325 MG tablet Take 1-2 tablets (325-650 mg total) by mouth every 6 (six) hours as needed for mild pain (pain score 1-3 or temp > 100.5).     B Complex-C (B-COMPLEX WITH VITAMIN C) tablet Take 1 tablet by mouth daily.     benzonatate (TESSALON) 100 MG capsule Take 1 capsule (100 mg total) by mouth 2 (two) times daily as needed for cough. 30 capsule 0   diltiazem (CARDIZEM SR) 120 MG 12 hr capsule TAKE 1 CAPSULE BY MOUTH 2 TIMES DAILY. 180 capsule 3   furosemide (LASIX) 20 MG tablet Take 1 tablet (20 mg total) by mouth every other day. 90 tablet 3   levothyroxine (SYNTHROID) 75 MCG tablet TAKE 1 TABLET BY MOUTH EVERY DAY BEFORE BREAKFAST 90 tablet 1   polyethylene glycol (MIRALAX) 17 g packet Take 17 g by mouth daily. Hold for loose bowel movements 60 each 0   potassium chloride (KLOR-CON M10) 10 MEQ tablet Take 1 tablet (10 mEq total) by mouth every  other day. 90 tablet 3   pravastatin (PRAVACHOL) 10 MG tablet TAKE 1 TABLET BY MOUTH EVERY DAY 90 tablet 3   XARELTO 15 MG TABS tablet TAKE 1 TABLET BY MOUTH EVERY DAY WITH SUPPER 90 tablet 1   No current facility-administered medications on file prior to visit.    Review of Systems  Constitutional:  Negative for fever.  Respiratory:  Negative for cough, shortness of breath and wheezing.   Cardiovascular:  Positive for leg swelling. Negative for chest pain and palpitations.  Neurological:  Negative for light-headedness.       Objective:   Vitals:   10/22/22 1051  BP: 132/62  Pulse: 70  Temp: 97.9 F (36.6 C)  SpO2: 98%   BP Readings from Last 3 Encounters:  10/22/22 132/62  10/07/22 110/74  09/19/22 126/78   Wt Readings from Last 3 Encounters:  10/22/22 144 lb (65.3 kg)  10/07/22 143 lb 6.4 oz (65 kg)  09/19/22 142 lb 3.2 oz (64.5 kg)   Body mass index is 25.51 kg/m.    Physical Exam Constitutional:      General: She is not in acute distress.    Appearance: Normal appearance. She is not ill-appearing.  HENT:     Head: Normocephalic and  atraumatic.  Eyes:     Conjunctiva/sclera: Conjunctivae normal.  Cardiovascular:     Rate and Rhythm: Normal rate. Rhythm irregular.     Heart sounds: Murmur heard.  Pulmonary:     Effort: Pulmonary effort is normal. No respiratory distress.     Breath sounds: Normal breath sounds. No wheezing or rales.  Musculoskeletal:     Cervical back: Neck supple. No tenderness.     Right lower leg: Edema (1+ pitting lower one third of leg) present.     Left lower leg: Edema (1+ pitting lower one half of leg, overall leg size is larger) present.  Lymphadenopathy:     Cervical: No cervical adenopathy.  Skin:    General: Skin is warm and dry.     Findings: Erythema (Minimal erythema left lateral lower leg-looks like it is from swelling-Warm, nontender) present.  Neurological:     Mental Status: She is alert.             Assessment & Plan:    See Problem List for Assessment and Plan of chronic medical problems.

## 2022-10-27 ENCOUNTER — Encounter: Payer: Self-pay | Admitting: Internal Medicine

## 2022-10-30 ENCOUNTER — Other Ambulatory Visit: Payer: Self-pay | Admitting: Cardiology

## 2022-10-31 ENCOUNTER — Other Ambulatory Visit: Payer: Self-pay | Admitting: Cardiology

## 2022-11-01 ENCOUNTER — Encounter: Payer: Self-pay | Admitting: Cardiology

## 2022-11-03 MED ORDER — PRAVASTATIN SODIUM 10 MG PO TABS: 10.0000 mg | ORAL_TABLET | Freq: Every day | ORAL | 3 refills | Status: DC

## 2022-11-18 ENCOUNTER — Ambulatory Visit (HOSPITAL_COMMUNITY): Payer: Medicare Other | Attending: Cardiovascular Disease

## 2022-11-18 DIAGNOSIS — I340 Nonrheumatic mitral (valve) insufficiency: Secondary | ICD-10-CM | POA: Insufficient documentation

## 2022-11-18 LAB — ECHOCARDIOGRAM COMPLETE
Area-P 1/2: 4.34 cm2
S' Lateral: 2.7 cm

## 2022-11-20 ENCOUNTER — Ambulatory Visit: Payer: Medicare Other | Attending: Adult Health | Admitting: Nurse Practitioner

## 2022-11-20 ENCOUNTER — Encounter: Payer: Self-pay | Admitting: Nurse Practitioner

## 2022-11-20 VITALS — BP 128/50 | HR 87 | Ht 63.0 in | Wt 141.6 lb

## 2022-11-20 DIAGNOSIS — I10 Essential (primary) hypertension: Secondary | ICD-10-CM | POA: Diagnosis not present

## 2022-11-20 DIAGNOSIS — E782 Mixed hyperlipidemia: Secondary | ICD-10-CM

## 2022-11-20 DIAGNOSIS — I5032 Chronic diastolic (congestive) heart failure: Secondary | ICD-10-CM | POA: Diagnosis not present

## 2022-11-20 DIAGNOSIS — I340 Nonrheumatic mitral (valve) insufficiency: Secondary | ICD-10-CM

## 2022-11-20 DIAGNOSIS — R600 Localized edema: Secondary | ICD-10-CM

## 2022-11-20 DIAGNOSIS — I482 Chronic atrial fibrillation, unspecified: Secondary | ICD-10-CM

## 2022-11-20 NOTE — Progress Notes (Signed)
Office Visit    Patient Name: Grace Alvarado Date of Encounter: 11/20/2022  Primary Care Provider:  Binnie Rail, MD Primary Cardiologist:  Kirk Ruths, MD  Chief Complaint    89 year old female with a history of permanent atrial fibrillation, mitral valve prolapse with severe mitral valve regurgitation, hypertension, hyperlipidemia, and chronic bilateral lower extremity edema who presents for follow-up related to atrial fibrillation.  Past Medical History    Past Medical History:  Diagnosis Date   Atrial fibrillation (HCC)    Hyperlipidemia    Hypertension    Hypothyroidism    Mitral regurgitation    MVP (mitral valve prolapse)    Past Surgical History:  Procedure Laterality Date   APPENDECTOMY     c section     CESAREAN SECTION     CHOLECYSTECTOMY     COLONOSCOPY WITH PROPOFOL N/A 11/09/2018   Procedure: COLONOSCOPY WITH PROPOFOL;  Surgeon: Doran Stabler, MD;  Location: [facility];  Service: Gastroenterology;  Laterality: N/A;   ESOPHAGOGASTRODUODENOSCOPY (EGD) WITH PROPOFOL N/A 11/09/2018   Procedure: ESOPHAGOGASTRODUODENOSCOPY (EGD) WITH PROPOFOL;  Surgeon: Doran Stabler, MD;  Location: [facility];  Service: Gastroenterology;  Laterality: N/A;   HOT HEMOSTASIS N/A 11/09/2018   Procedure: HOT HEMOSTASIS (ARGON PLASMA COAGULATION/BICAP);  Surgeon: Doran Stabler, MD;  Location: [facility];  Service: Gastroenterology;  Laterality: N/A;   I & D EXTREMITY Left 03/02/2020   Procedure: evacuation of left knee hematoma, possible wound vac placement;  Surgeon: Meredith Pel, MD;  Location: [facility];  Service: Orthopedics;  Laterality: Left;   SUBMUCOSAL INJECTION  11/09/2018   Procedure: SUBMUCOSAL INJECTION;  Surgeon: Doran Stabler, MD;  Location: [facility] ENDOSCOPY;  Service: Gastroenterology;;    Allergies  Allergies  Allergen Reactions   Codeine Other (See Comments)    unknown   Penicillins Other (See Comments)    unknown   Vancomycin Other (See  Comments)    unknown   Zithromax [Azithromycin] Other (See Comments)    unknown   Latex Rash     Labs/Other Studies Reviewed    The following studies were reviewed today: Echo 11/18/2022: IMPRESSIONS     1. Left ventricular ejection fraction, by estimation, is 60 to 65%. Left  ventricular ejection fraction by PLAX is 61 %. The left ventricle has  normal function. The left ventricle has no regional wall motion  abnormalities. There is mild concentric left  ventricular hypertrophy. Left ventricular diastolic function could not be  evaluated. There is the interventricular septum is flattened in diastole  ('D' shaped left ventricle), consistent with right ventricular volume  overload.   2. Right ventricular systolic function is normal. The right ventricular  size is normal. There is mildly elevated pulmonary artery systolic  pressure.   3. Left atrial size was severely dilated.   4. Right atrial size was severely dilated.   5. The mitral valve is normal in structure. Moderate to severe mitral  valve regurgitation. No evidence of mitral stenosis. There is mild  prolapse of posterior of the mitral valve. Moderate mitral annular  calcification.   6. Tricuspid valve regurgitation is moderate.   7. The aortic valve is tricuspid. There is mild calcification of the  aortic valve. There is mild thickening of the aortic valve. Aortic valve  regurgitation is not visualized. No aortic stenosis is present.   8. The inferior vena cava is dilated in size with >50% respiratory  variability, suggesting right atrial pressure of 8 mmHg.  9. Cannot exclude a small PFO.   Recent Labs: 10/22/2022: ALT 10; BUN 8; Creatinine, Ser 0.67; Hemoglobin 12.5; Platelets 170.0; Potassium 4.3; Pro B Natriuretic peptide (BNP) 380.0; Sodium 135; TSH 4.01  Recent Lipid Panel    Component Value Date/Time   CHOL 130 10/22/2022 1138   CHOL 127 06/02/2022 0836   TRIG 57.0 10/22/2022 1138   HDL 64.50 10/22/2022  1138   HDL 64 06/02/2022 0836   CHOLHDL 2 10/22/2022 1138   VLDL 11.4 10/22/2022 1138   LDLCALC 54 10/22/2022 1138   LDLCALC 50 06/02/2022 0836    History of Present Illness    89 year old female with the above past medical history including  permanent atrial fibrillation, mitral valve prolapse with severe mitral valve regurgitation, hypertension, hyperlipidemia, and chronic bilateral lower extremity edema.  She was previously on amiodarone and Multaq per records (patient previously resided in [place]).  Nuclear study in 2012 showed no ischemia with normal LV function.  ABIs in 2021 were normal.  Echocardiogram in 12/2021 showed normal LV function, posterior mitral valve prolapse with severe mitral valve regurgitation, mild to moderate tricuspid valve regurgitation, severe BAE.  Patient declined valve surgery.  She was last seen in the office on 05/30/2022 and was stable from a cardiac standpoint.  It was noted that other imaging would not be pursued for mitral valve regurgitation as it would not change management strategy as patient declined surgical intervention.  She saw her PCP on 1/17/024 and noted persistent bilateral lower extremity edema. BNP was mildly elevated at 380. Lasix was increased to 20 mg daily.  Her PCP ordered repeat echocardiogram which revealed EF 60 to 65%, normal LV function, no RWMA, mild concentric LVH, indeterminate diastolic parameters, flattened interventricular septum in diastole consistent with right ventricular volume overload, normal RV systolic function, severe BAE, moderate to severe mitral valve regurgitation with prolapse of the posterior mitral valve, moderate MAC.  She presents today for follow-up accompanied by her daughter. Since her last visit she has been stable from a cardiac standpoint.  She continues to have some nonpitting bilateral lower extremity edema, left greater than right, much improved.  Some of her edema is dependent.  Her weight is down.  She  denies dyspnea, PND, orthopnea.  She is tolerating her increased Lasix dosing.  Overall, she reports feeling well.  Home Medications    Current Outpatient Medications  Medication Sig Dispense Refill   acetaminophen (TYLENOL) 325 MG tablet Take 1-2 tablets (325-650 mg total) by mouth every 6 (six) hours as needed for mild pain (pain score 1-3 or temp > 100.5).     B Complex-C (B-COMPLEX WITH VITAMIN C) tablet Take 1 tablet by mouth daily.     benzonatate (TESSALON) 100 MG capsule Take 1 capsule (100 mg total) by mouth 2 (two) times daily as needed for cough. 30 capsule 0   diltiazem (CARDIZEM SR) 120 MG 12 hr capsule TAKE 1 CAPSULE BY MOUTH TWICE A DAY 180 capsule 3   furosemide (LASIX) 20 MG tablet Take 1 tablet (20 mg total) by mouth daily. 90 tablet 3   levothyroxine (SYNTHROID) 75 MCG tablet TAKE 1 TABLET BY MOUTH EVERY DAY BEFORE BREAKFAST 90 tablet 1   polyethylene glycol (MIRALAX) 17 g packet Take 17 g by mouth daily. Hold for loose bowel movements 60 each 0   potassium chloride (KLOR-CON M10) 10 MEQ tablet Take 1 tablet (10 mEq total) by mouth daily. 90 tablet 3   pravastatin (PRAVACHOL) 10 MG tablet Take 1  tablet (10 mg total) by mouth daily. 90 tablet 3   XARELTO 15 MG TABS tablet TAKE 1 TABLET BY MOUTH EVERY DAY WITH SUPPER 90 tablet 1   No current facility-administered medications for this visit.     Review of Systems    She denies chest pain, palpitations, dyspnea, pnd, orthopnea, n, v, dizziness, syncope, weight gain, or early satiety. All other systems reviewed and are otherwise negative except as noted above.   Physical Exam    VS:  BP (!) 128/50 (BP Location: Left Arm, Patient Position: Sitting, Cuff Size: Normal)   Pulse 87   Ht 5' 3"$  (1.6 m)   Wt 141 lb 9.6 oz (64.2 kg)   LMP  (LMP Unknown)   SpO2 99%   BMI 25.08 kg/m  GEN: Well nourished, well developed, in no acute distress. HEENT: normal. Neck: Supple, no JVD, carotid bruits, or masses. Cardiac: RRR, no  murmurs, rubs, or gallops. No clubbing, cyanosis, nonpitting bilateral lower extremity edema, L>R.  Radials/DP/PT 2+ and equal bilaterally.  Respiratory:  Respirations regular and unlabored, clear to auscultation bilaterally. GI: Soft, nontender, nondistended, BS + x 4. MS: no deformity or atrophy. Skin: warm and dry, no rash. Neuro:  Strength and sensation are intact. Psych: Normal affect.  Accessory Clinical Findings    ECG personally reviewed by me today -atrial fibrillation, 87 bpm, PVCs- no acute changes.   Lab Results  Component Value Date   WBC 6.4 10/22/2022   HGB 12.5 10/22/2022   HCT 36.0 10/22/2022   MCV 95.2 10/22/2022   PLT 170.0 10/22/2022   Lab Results  Component Value Date   CREATININE 0.67 10/22/2022   BUN 8 10/22/2022   NA 135 10/22/2022   K 4.3 10/22/2022   CL 99 10/22/2022   CO2 29 10/22/2022   Lab Results  Component Value Date   ALT 10 10/22/2022   AST 16 10/22/2022   ALKPHOS 73 10/22/2022   BILITOT 0.8 10/22/2022   Lab Results  Component Value Date   CHOL 130 10/22/2022   HDL 64.50 10/22/2022   LDLCALC 54 10/22/2022   TRIG 57.0 10/22/2022   CHOLHDL 2 10/22/2022    Lab Results  Component Value Date   HGBA1C 5.2 12/18/2020    Assessment & Plan   1. Bilateral lower extremity edema/chronic diastolic heart failure: Most recent echo revealed EF 60 to 65%, normal LV function, no RWMA, mild concentric LVH, indeterminate diastolic parameters, flattened interventricular septum in diastole consistent with right ventricular volume overload, normal RV systolic function, severe BAE, moderate to severe mitral valve regurgitation with prolapse of the posterior mitral valve, moderate MAC.  She did have a recent increase in lower extremity edema, improved with increased Lasix dosing.  She continues to have nonpitting bilateral lower extremity edema, left greater than right, there is a dependent component to her swelling.  Otherwise euvolemic and well compensated  on exam. Will repeat BNP, BMET today.  If BNP remains elevated, renal function stable, she may benefit from a few days of increase Lasix dosing.  Will await labs.  Encouraged compression, elevation.  I advised her to notify us if she has worsening edema, weight gain, shortness of breath. Continue Lasix.   2. Permanent atrial fibrillation: Rate controlled.  Continue diltiazem, Xarelto.  Most recent BMET, CBC in 10/2022 were stable.  3. Mitral valve prolapse with severe mitral valve regurgitation: Moderate to severe on most recent echo.  She has declined surgical intervention.  She has had some recent bilateral  lower extremity edema as above, improved with increased Lasix dosing.  No indication for repeat echocardiogram in the future as it will not change management plan.  4. Hypertension: BP well controlled. Continue current antihypertensive regimen.   5. Hyperlipidemia: LDL was 54 in 10/2022.  Continue pravastatin.  6. Disposition: Follow-up as scheduled with with Dr. Stanford Breed in 02/2023.     Lenna Sciara, NP 11/20/2022, 9:46 AM

## 2022-11-20 NOTE — Patient Instructions (Signed)
Medication Instructions:  Your physician recommends that you continue on your current medications as directed. Please refer to the Current Medication list given to you today.   *If you need a refill on your cardiac medications before your next appointment, please call your pharmacy*   Lab Work: Your physician recommends that you complete lab work today BNP & BMET   If you have labs (blood work) drawn today and your tests are completely normal, you will receive your results only by: MyChart Message (if you have MyChart) OR A paper copy in the mail If you have any lab test that is abnormal or we need to change your treatment, we will call you to review the results.   Testing/Procedures: NONE ordered at this time of appointment     Follow-Up: At [facility], you and your health needs are our priority.  As part of our continuing mission to provide you with exceptional heart care, we have created designated Provider Care Teams.  These Care Teams include your primary Cardiologist (physician) and Advanced Practice Providers (APPs -  Physician Assistants and Nurse Practitioners) who all work together to provide you with the care you need, when you need it.  We recommend signing up for the patient portal called "MyChart".  Sign up information is provided on this After Visit Summary.  MyChart is used to connect with patients for Virtual Visits (Telemedicine).  Patients are able to view lab/test results, encounter notes, upcoming appointments, etc.  Non-urgent messages can be sent to your provider as well.   To learn more about what you can do with MyChart, go to [email or web address].    Your next appointment:   Keep Follow up    Provider:   Kirk Ruths, MD     Other Instructions

## 2022-11-21 LAB — BASIC METABOLIC PANEL
BUN/Creatinine Ratio: 13 (ref 12–28)
BUN: 9 mg/dL — ABNORMAL LOW (ref 10–36)
CO2: 26 mmol/L (ref 20–29)
Calcium: 9.8 mg/dL (ref 8.7–10.3)
Chloride: 102 mmol/L (ref 96–106)
Creatinine, Ser: 0.7 mg/dL (ref 0.57–1.00)
Glucose: 93 mg/dL (ref 70–99)
Potassium: 4.1 mmol/L (ref 3.5–5.2)
Sodium: 140 mmol/L (ref 134–144)
eGFR: 80 mL/min/{1.73_m2} (ref >59)

## 2022-11-21 LAB — BRAIN NATRIURETIC PEPTIDE: BNP: 271.3 pg/mL — ABNORMAL HIGH (ref 0.0–100.0)

## 2022-11-24 ENCOUNTER — Ambulatory Visit (INDEPENDENT_AMBULATORY_CARE_PROVIDER_SITE_OTHER): Payer: Medicare Other

## 2022-11-24 DIAGNOSIS — E538 Deficiency of other specified B group vitamins: Secondary | ICD-10-CM

## 2022-11-24 DIAGNOSIS — I5032 Chronic diastolic (congestive) heart failure: Secondary | ICD-10-CM

## 2022-11-24 MED ORDER — CYANOCOBALAMIN 1000 MCG/ML IJ SOLN
1000.0000 ug | Freq: Once | INTRAMUSCULAR | Status: AC
  Administered 2022-11-24: 1000 ug via INTRAMUSCULAR

## 2022-11-24 NOTE — Progress Notes (Signed)
After obtaining consent, and per orders of Dr. Quay Burow, injection of B12 given by Marrian Salvage. Patient instructed to report any adverse reaction to me immediately.

## 2022-11-26 ENCOUNTER — Other Ambulatory Visit: Payer: Self-pay

## 2022-11-26 ENCOUNTER — Ambulatory Visit: Payer: Medicare Other | Admitting: General Practice

## 2022-11-26 DIAGNOSIS — R600 Localized edema: Secondary | ICD-10-CM

## 2022-11-26 DIAGNOSIS — Z79899 Other long term (current) drug therapy: Secondary | ICD-10-CM

## 2022-11-26 DIAGNOSIS — I5032 Chronic diastolic (congestive) heart failure: Secondary | ICD-10-CM

## 2022-11-28 ENCOUNTER — Ambulatory Visit: Payer: Medicare Other | Admitting: Adult Health

## 2022-12-15 ENCOUNTER — Ambulatory Visit: Payer: Medicare Other | Admitting: Podiatry

## 2022-12-15 DIAGNOSIS — M79675 Pain in left toe(s): Secondary | ICD-10-CM

## 2022-12-15 DIAGNOSIS — Z7901 Long term (current) use of anticoagulants: Secondary | ICD-10-CM

## 2022-12-15 DIAGNOSIS — L84 Corns and callosities: Secondary | ICD-10-CM

## 2022-12-15 DIAGNOSIS — B351 Tinea unguium: Secondary | ICD-10-CM

## 2022-12-15 DIAGNOSIS — M79674 Pain in right toe(s): Secondary | ICD-10-CM | POA: Diagnosis not present

## 2022-12-15 NOTE — Progress Notes (Signed)
Subjective:  89 y.o. returns the office today for painful, elongated, thickened toenails which she cannot trim herself and for calluses.  No open lesions that she reports.  She still gets numbness to her feet which are chronic issue and also swelling to the legs which has been a chronic issue as well.  She does not want to take any additional fluid pills.  She is on Xarelto  PCP: Binnie Rail, MD Last seen 10/22/2022  Objective: NAD- presents with caregiver DP/PT pulses palpable, CRT less than 3 seconds Hyperkeratotic lesion medial first metatarsal head left foot without any underlying ulceration drainage or signs of infection. Nails hypertrophic, dystrophic, elongated, brittle, discolored 10. There is tenderness overlying the nails 1-5 on the left and 1-5 on the right. There is no surrounding erythema or drainage along the nail sites. Pitting edema present bilaterally. No other areas of discomfort to bilateral lower extremities today.  No pain with calf compression, swelling, warmth, erythema.  Assessment: Patient presents with symptomatic onychomycosis  Plan: -Treatment options including alternatives, risks, complications were discussed -Nails sharply debrided x 10 without complication/bleeding. -Sharply debrided the callus x 1 without any complications or bleeding. -Discussed daily foot inspection. If there are any changes, to call the office immediately.  -Follow-up in 3 months or sooner if any problems are to arise. In the meantime, encouraged to call the office with any questions, concerns, changes symptoms.  Celesta Gentile, DPM

## 2022-12-16 ENCOUNTER — Other Ambulatory Visit: Payer: Self-pay | Admitting: Cardiology

## 2022-12-16 ENCOUNTER — Other Ambulatory Visit: Payer: Self-pay | Admitting: Internal Medicine

## 2022-12-16 ENCOUNTER — Encounter: Payer: Self-pay | Admitting: Internal Medicine

## 2022-12-16 ENCOUNTER — Other Ambulatory Visit: Payer: Self-pay

## 2022-12-16 DIAGNOSIS — Z7901 Long term (current) use of anticoagulants: Secondary | ICD-10-CM

## 2022-12-16 DIAGNOSIS — I482 Chronic atrial fibrillation, unspecified: Secondary | ICD-10-CM

## 2022-12-16 MED ORDER — FUROSEMIDE 20 MG PO TABS: 20.0000 mg | ORAL_TABLET | Freq: Every day | ORAL | 3 refills | Status: DC

## 2022-12-16 NOTE — Telephone Encounter (Signed)
Pt last saw Diona Browner, NP on 11/20/22, last labs 11/20/22 Creat 0.70, age 89, weight 64.2kg, CrCl 48.72, basedon CrCl pt is on appropriate dosage of Xarelto '15mg'$  QD for afib.  Will refill rx.

## 2022-12-21 ENCOUNTER — Encounter: Payer: Self-pay | Admitting: Internal Medicine

## 2022-12-22 ENCOUNTER — Encounter: Payer: Self-pay | Admitting: Internal Medicine

## 2022-12-22 NOTE — Progress Notes (Unsigned)
    Subjective:    Patient ID: Grace Alvarado, female    DOB: 1925-02-16, 89 y.o.   MRN: [id number]      HPI Grace Alvarado is here for No chief complaint on file.   Leg edema -   Painful sore under L breast   B12 injection -      Medications and allergies reviewed with patient and updated if appropriate.  Current Outpatient Medications on File Prior to Visit  Medication Sig Dispense Refill   acetaminophen (TYLENOL) 325 MG tablet Take 1-2 tablets (325-650 mg total) by mouth every 6 (six) hours as needed for mild pain (pain score 1-3 or temp > 100.5).     B Complex-C (B-COMPLEX WITH VITAMIN C) tablet Take 1 tablet by mouth daily.     benzonatate (TESSALON) 100 MG capsule Take 1 capsule (100 mg total) by mouth 2 (two) times daily as needed for cough. 30 capsule 0   diltiazem (CARDIZEM SR) 120 MG 12 hr capsule TAKE 1 CAPSULE BY MOUTH TWICE A DAY 180 capsule 3   furosemide (LASIX) 20 MG tablet Take 1 tablet (20 mg total) by mouth daily. 90 tablet 3   KLOR-CON M10 10 MEQ tablet TAKE 1 TABLET BY MOUTH EVERY DAY 90 tablet 3   levothyroxine (SYNTHROID) 75 MCG tablet TAKE 1 TABLET BY MOUTH EVERY DAY BEFORE BREAKFAST 90 tablet 1   polyethylene glycol (MIRALAX) 17 g packet Take 17 g by mouth daily. Hold for loose bowel movements 60 each 0   pravastatin (PRAVACHOL) 10 MG tablet Take 1 tablet (10 mg total) by mouth daily. 90 tablet 3   XARELTO 15 MG TABS tablet TAKE 1 TABLET BY MOUTH EVERY DAY WITH SUPPER 90 tablet 1   No current facility-administered medications on file prior to visit.    Review of Systems     Objective:  There were no vitals filed for this visit. BP Readings from Last 3 Encounters:  11/20/22 (!) 128/50  10/22/22 132/62  10/07/22 110/74   Wt Readings from Last 3 Encounters:  11/20/22 141 lb 9.6 oz (64.2 kg)  10/22/22 144 lb (65.3 kg)  10/07/22 143 lb 6.4 oz (65 kg)   There is no height or weight on file to calculate BMI.    Physical Exam         Assessment  & Plan:    See Problem List for Assessment and Plan of chronic medical problems.

## 2022-12-23 ENCOUNTER — Encounter: Payer: Self-pay | Admitting: Internal Medicine

## 2022-12-23 ENCOUNTER — Ambulatory Visit: Payer: Medicare Other | Admitting: Internal Medicine

## 2022-12-23 ENCOUNTER — Encounter: Payer: Self-pay | Admitting: Cardiology

## 2022-12-23 VITALS — BP 120/72 | HR 85 | Temp 98.0°F | Ht 63.0 in | Wt 143.0 lb

## 2022-12-23 DIAGNOSIS — I10 Essential (primary) hypertension: Secondary | ICD-10-CM | POA: Diagnosis not present

## 2022-12-23 DIAGNOSIS — R0781 Pleurodynia: Secondary | ICD-10-CM

## 2022-12-23 DIAGNOSIS — D229 Melanocytic nevi, unspecified: Secondary | ICD-10-CM | POA: Diagnosis not present

## 2022-12-23 DIAGNOSIS — R600 Localized edema: Secondary | ICD-10-CM | POA: Diagnosis not present

## 2022-12-23 DIAGNOSIS — E538 Deficiency of other specified B group vitamins: Secondary | ICD-10-CM | POA: Diagnosis not present

## 2022-12-23 MED ORDER — CYANOCOBALAMIN 1000 MCG/ML IJ SOLN
1000.0000 ug | Freq: Once | INTRAMUSCULAR | Status: AC
  Administered 2022-12-23: 1000 ug via INTRAMUSCULAR

## 2022-12-23 NOTE — Assessment & Plan Note (Signed)
Chronic Overall leg swelling is stable Left leg is larger than right leg Left leg swelling is worse than right leg swelling Both legs approximately 1+ edema pitting Swelling improves overnight and gets worse during the day She elevates her legs some, but not as much she should She is walking some, but not as much as she used to She does have sodium in her diet-typically eats TV dinners She is not having any shortness of breath, chest pain or palpitations Continue Lasix 20 mg daily The leg swelling is only bothering her and her family because of how it looks-it is not uncomfortable and she is not having any concerning symptoms Weight overall stable Discussed that she can take an extra Lasix on occasion if she wants to see if that helps with some of the leg swelling, but we really do not need to treat it at this point She will try compression socks-one of her daughters is coming to stay with her and she can help put them on and take them off and see if that helps Stressed near elevation of the legs Continue to monitor weight

## 2022-12-23 NOTE — Assessment & Plan Note (Signed)
Left upper abdomen Nonpainful Benign. Likely benign keratosis

## 2022-12-23 NOTE — Assessment & Plan Note (Signed)
Chronic Continue monthly B12 injections Injection due today-given

## 2022-12-23 NOTE — Assessment & Plan Note (Signed)
Chronic Blood pressure today well-controlled Continue diltiazem 120 mg twice daily

## 2022-12-23 NOTE — Assessment & Plan Note (Signed)
Chronic Pain to palpation left bottom rib Likely cartilage inflammation Symptomatic treatment

## 2022-12-23 NOTE — Patient Instructions (Signed)
     You had a B12 injection today.    Medications changes include :   none. Continue the water pill 20 mg daily - you can take an extra pill on occasion if needed.

## 2022-12-25 ENCOUNTER — Ambulatory Visit: Payer: Medicare Other

## 2022-12-29 ENCOUNTER — Ambulatory Visit: Payer: Medicare Other

## 2022-12-30 ENCOUNTER — Encounter: Payer: Self-pay | Admitting: Internal Medicine

## 2022-12-30 ENCOUNTER — Other Ambulatory Visit: Payer: Self-pay | Admitting: Internal Medicine

## 2022-12-31 ENCOUNTER — Ambulatory Visit: Payer: Medicare Other

## 2023-01-03 IMAGING — DX DG TIBIA/FIBULA 2V*R*
4 series · 4 of 4 positions shown · non-contrast
Comparison: None.

CLINICAL DATA: Pain

EXAM:
RIGHT TIBIA AND FIBULA - 2 VIEW

[tibia ap (1 of 2)]
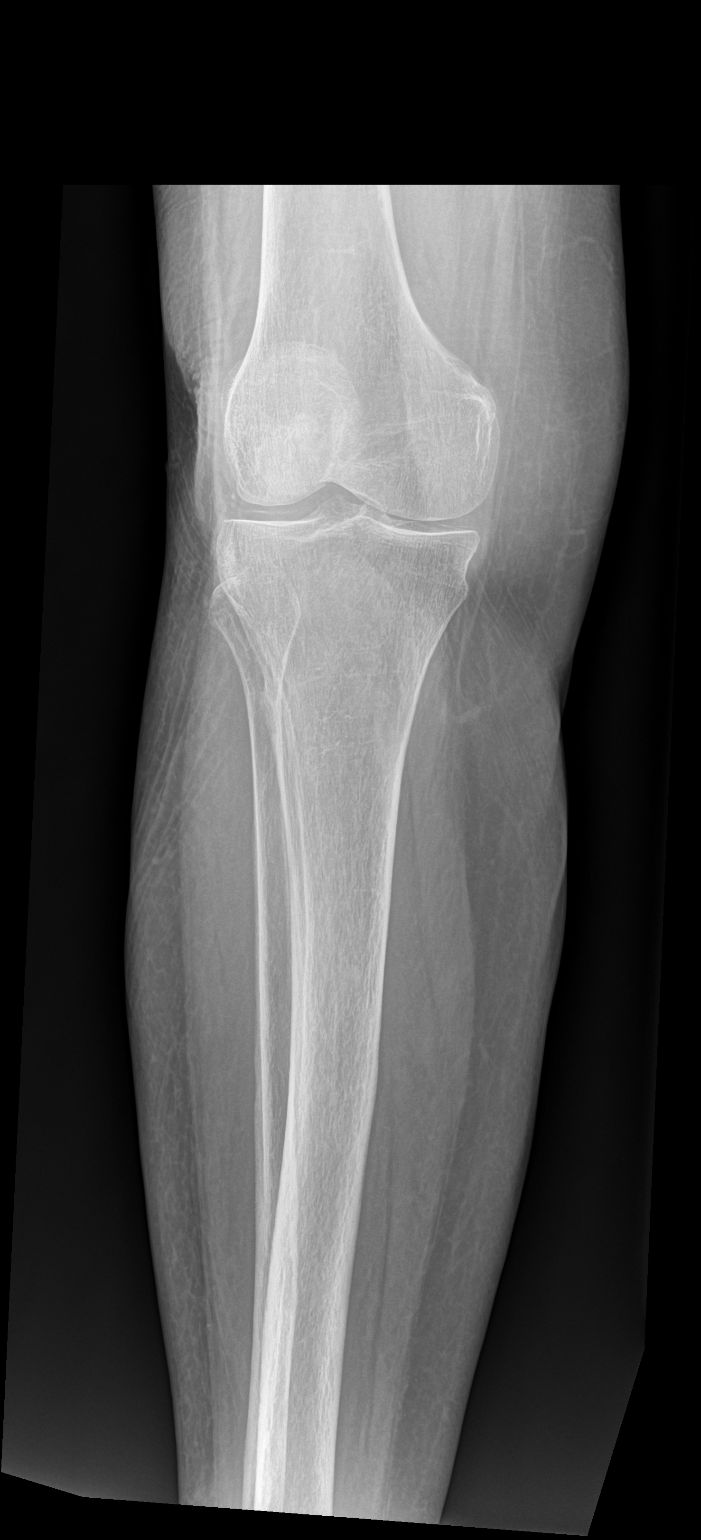

[tibia ap (2 of 2)]
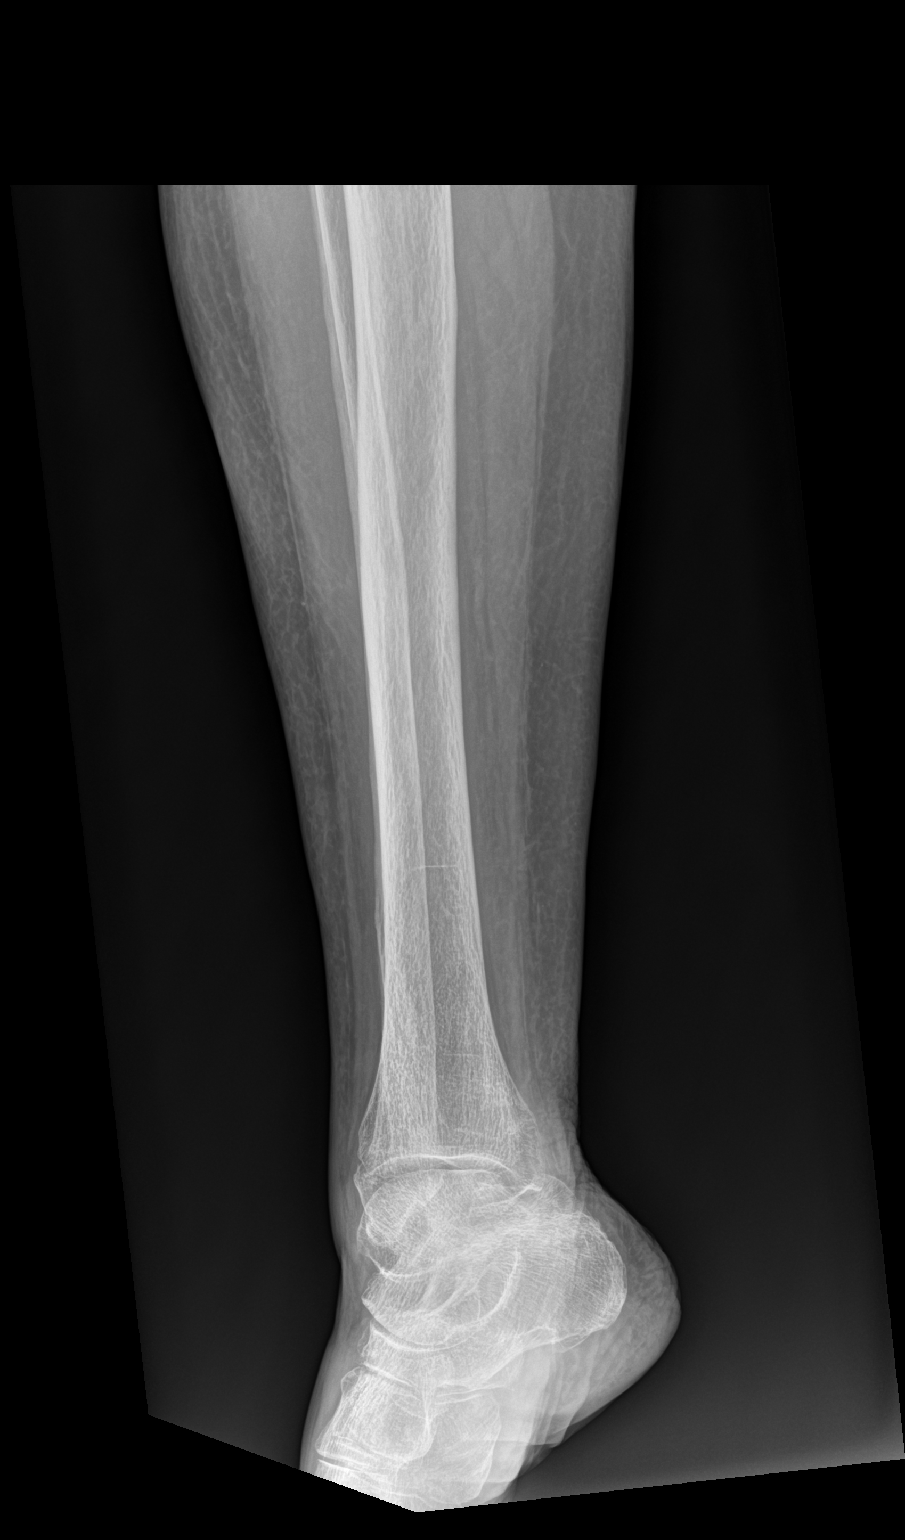

[tibia lat (1 of 2)]
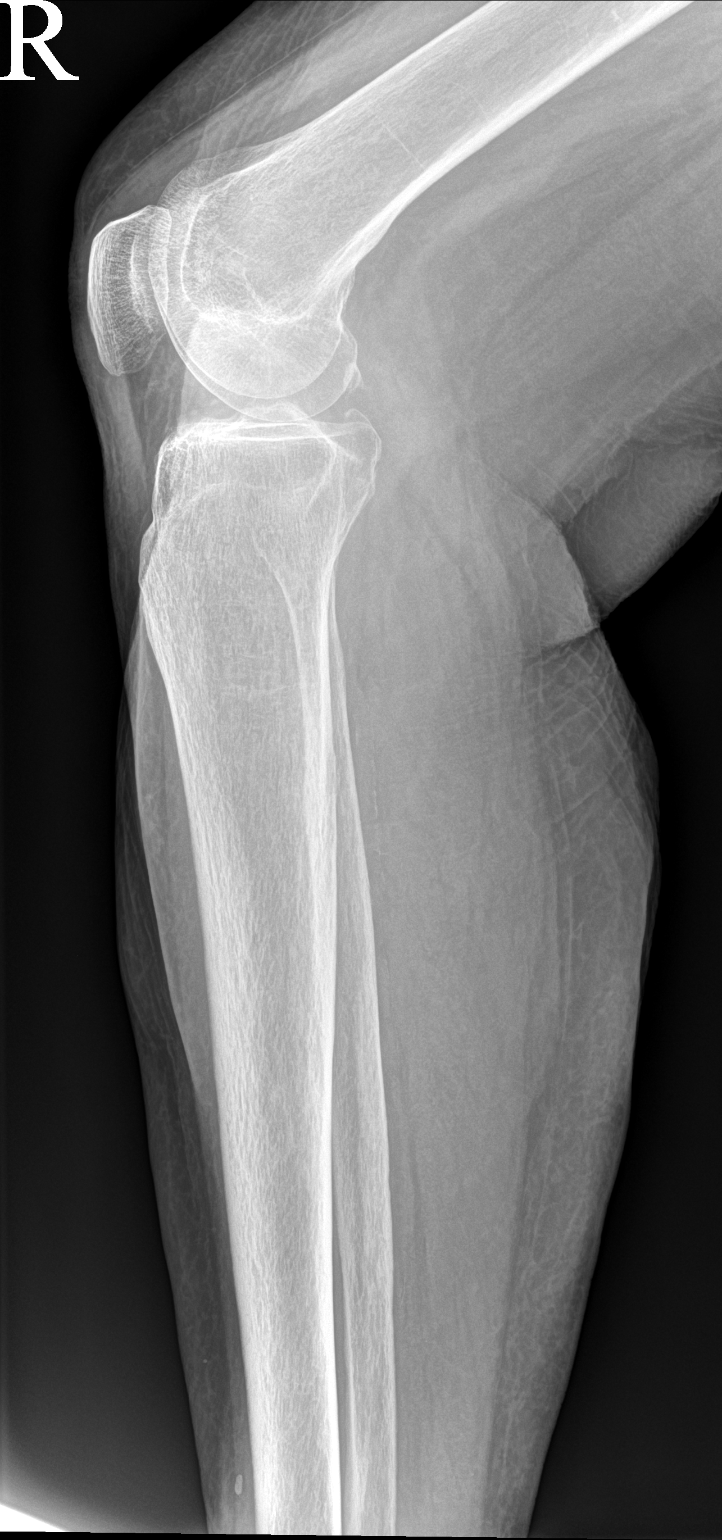

[tibia lat (2 of 2)]
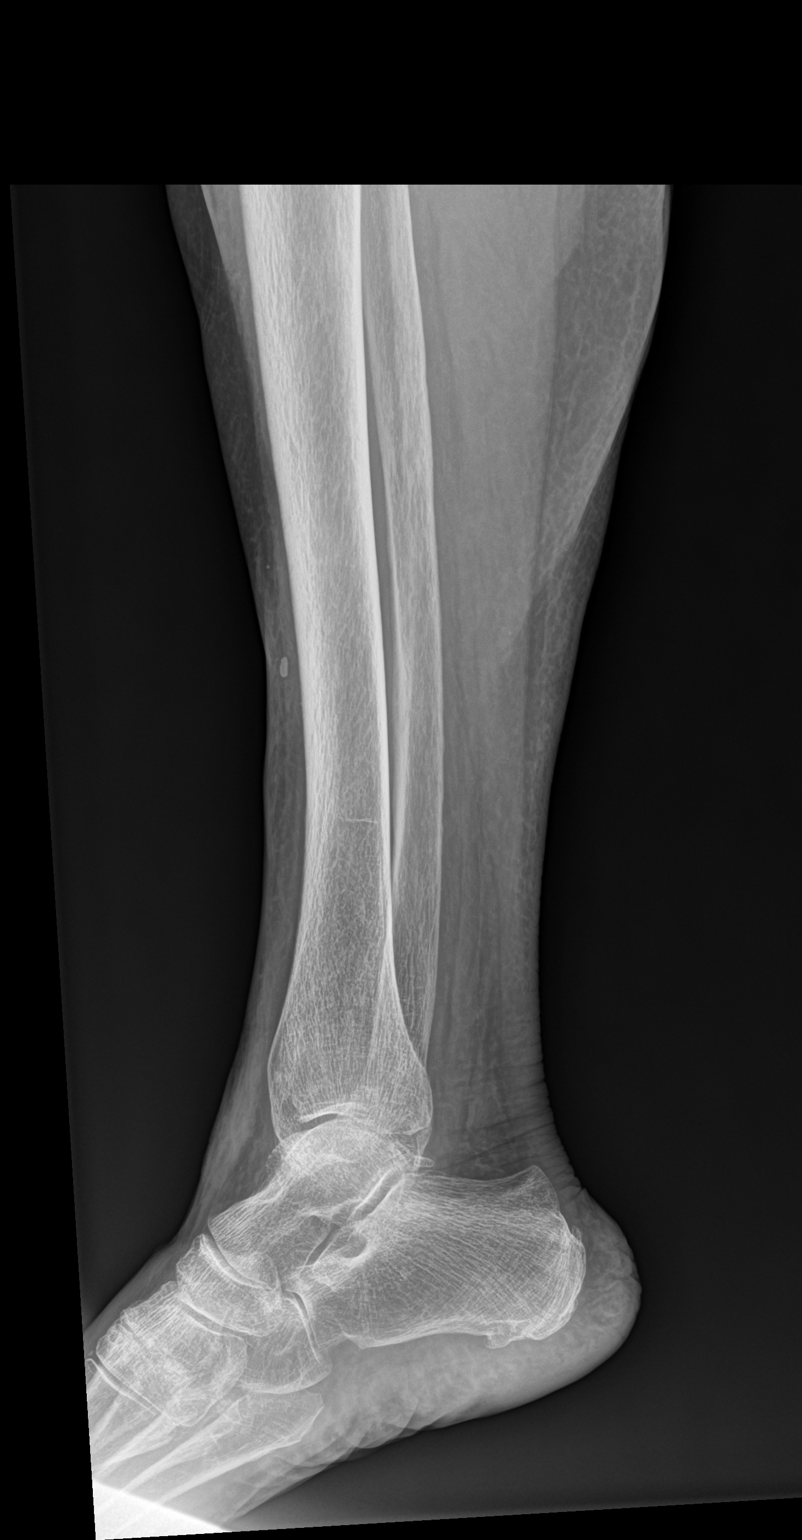

[4 of 4 positions shown; findings below may reference images not displayed]

FINDINGS: There is no evidence of fracture or other focal bone lesions. Soft
tissues are unremarkable.
IMPRESSION: Negative.

## 2023-01-22 ENCOUNTER — Ambulatory Visit (INDEPENDENT_AMBULATORY_CARE_PROVIDER_SITE_OTHER): Payer: Medicare Other

## 2023-01-22 DIAGNOSIS — E538 Deficiency of other specified B group vitamins: Secondary | ICD-10-CM

## 2023-01-22 MED ORDER — CYANOCOBALAMIN 1000 MCG/ML IJ SOLN
1000.0000 ug | Freq: Once | INTRAMUSCULAR | Status: AC
  Administered 2023-01-22: 1000 ug via INTRAMUSCULAR

## 2023-01-22 NOTE — Progress Notes (Signed)
Pt here for monthly B12 injection per   B12 given IM and pt tolerated injection well.  Patient responded well to vaccine.

## 2023-01-23 ENCOUNTER — Ambulatory Visit: Payer: Medicare Other

## 2023-02-11 NOTE — Progress Notes (Signed)
HPI: FU atrial fibrillation. Patient previously resided in [place].  Patient with history of permanent atrial fibrillation.  Previously failed amiodarone and multaq per records. Nuclear study 2012 showed no ischemia with normal LV function. ABIs April 2021 normal. Venous Dopplers March 2023 showed no DVT.  Echocardiogram February 2024 showed normal LV function, mild left ventricular hypertrophy, flattened septum consistent with RV volume overload, severe biatrial enlargement, moderate to severe mitral regurgitation, moderate tricuspid regurgitation. Patient made it clear previously that she did not want valve surgery.  Since last seen denies dyspnea, chest pain, palpitations, syncope or bleeding.  She does not fall.  Current Outpatient Medications  Medication Sig Dispense Refill   acetaminophen (TYLENOL) 325 MG tablet Take 1-2 tablets (325-650 mg total) by mouth every 6 (six) hours as needed for mild pain (pain score 1-3 or temp > 100.5).     B Complex-C (B-COMPLEX WITH VITAMIN C) tablet Take 1 tablet by mouth daily.     diltiazem (CARDIZEM SR) 120 MG 12 hr capsule TAKE 1 CAPSULE BY MOUTH TWICE A DAY 180 capsule 3   furosemide (LASIX) 20 MG tablet Take 1 tablet (20 mg total) by mouth daily. 90 tablet 3   KLOR-CON M10 10 MEQ tablet TAKE 1 TABLET BY MOUTH EVERY DAY 90 tablet 3   levothyroxine (SYNTHROID) 75 MCG tablet TAKE 1 TABLET BY MOUTH EVERY DAY BEFORE BREAKFAST 90 tablet 1   pravastatin (PRAVACHOL) 10 MG tablet Take 1 tablet (10 mg total) by mouth daily. 90 tablet 3   XARELTO 15 MG TABS tablet TAKE 1 TABLET BY MOUTH EVERY DAY WITH SUPPER 90 tablet 1   No current facility-administered medications for this visit.     Past Medical History:  Diagnosis Date   Atrial fibrillation (HCC)    Hyperlipidemia    Hypertension    Hypothyroidism    Mitral regurgitation    MVP (mitral valve prolapse)     Past Surgical History:  Procedure Laterality Date   APPENDECTOMY     c section      CESAREAN SECTION     CHOLECYSTECTOMY     COLONOSCOPY WITH PROPOFOL N/A 11/09/2018   Procedure: COLONOSCOPY WITH PROPOFOL;  Surgeon: Sherrilyn Rist, MD;  Location: [facility] ENDOSCOPY;  Service: Gastroenterology;  Laterality: N/A;   ESOPHAGOGASTRODUODENOSCOPY (EGD) WITH PROPOFOL N/A 11/09/2018   Procedure: ESOPHAGOGASTRODUODENOSCOPY (EGD) WITH PROPOFOL;  Surgeon: Sherrilyn Rist, MD;  Location: [facility] ENDOSCOPY;  Service: Gastroenterology;  Laterality: N/A;   HOT HEMOSTASIS N/A 11/09/2018   Procedure: HOT HEMOSTASIS (ARGON PLASMA COAGULATION/BICAP);  Surgeon: Sherrilyn Rist, MD;  Location: [facility] ENDOSCOPY;  Service: Gastroenterology;  Laterality: N/A;   I & D EXTREMITY Left 03/02/2020   Procedure: evacuation of left knee hematoma, possible wound vac placement;  Surgeon: Cammy Copa, MD;  Location: [facility] OR;  Service: Orthopedics;  Laterality: Left;   SUBMUCOSAL INJECTION  11/09/2018   Procedure: SUBMUCOSAL INJECTION;  Surgeon: Sherrilyn Rist, MD;  Location: [facility] ENDOSCOPY;  Service: Gastroenterology;;    Social History   Socioeconomic History   Marital status: Widowed    Spouse name: Not on file   Number of children: 3   Years of education: Not on file   Highest education level: Not on file  Occupational History   Occupation: retired  Tobacco Use   Smoking status: Former   Smokeless tobacco: Never  Building services engineer Use: Never used  Substance and Sexual Activity   Alcohol use: Not  Currently   Drug use: Never   Sexual activity: Not Currently  Other Topics Concern   Not on file  Social History Narrative   Not on file   Social Determinants of Health   Financial Resource Strain: Not on file  Food Insecurity: Not on file  Transportation Needs: Not on file  Physical Activity: Not on file  Stress: Not on file  Social Connections: Not on file  Intimate Partner Violence: Not on file    Family History  Problem Relation Age of Onset   Cancer Mother    Dementia Mother     ROS:  no fevers or chills, productive cough, hemoptysis, dysphasia, odynophagia, melena, hematochezia, dysuria, hematuria, rash, seizure activity, orthopnea, PND, pedal edema, claudication. Remaining systems are negative.  Physical Exam: Well-developed well-nourished in no acute distress.  Skin is warm and dry.  HEENT is normal.  Neck is supple.  Chest is clear to auscultation with normal expansion.  Cardiovascular exam is irregular Abdominal exam nontender or distended. No masses palpated. Extremities show trace edema. neuro grossly intact   A/P  1 mitral valve prolapse/mitral regurgitation-given patient's age we are treating conservatively.  She also would not consider surgical intervention if necessary.  I would plan to not pursue further imaging given it will not affect our management.  2 permanent atrial fibrillation-continue Cardizem and Xarelto.  3 hypertension-blood pressure controlled.  Continue present medications.  4 hyperlipidemia-continue statin.  5 lower extremity edema-patient is euvolemic on examination.  Will continue diuretic at present dose.   Olga Millers, MD

## 2023-02-20 ENCOUNTER — Ambulatory Visit: Payer: Medicare Other | Attending: General Practice | Admitting: Cardiology

## 2023-02-20 ENCOUNTER — Encounter: Payer: Self-pay | Admitting: Cardiology

## 2023-02-20 VITALS — BP 131/62 | HR 95 | Ht 63.0 in | Wt 145.4 lb

## 2023-02-20 DIAGNOSIS — I340 Nonrheumatic mitral (valve) insufficiency: Secondary | ICD-10-CM

## 2023-02-20 DIAGNOSIS — I5032 Chronic diastolic (congestive) heart failure: Secondary | ICD-10-CM

## 2023-02-20 DIAGNOSIS — I10 Essential (primary) hypertension: Secondary | ICD-10-CM

## 2023-02-20 DIAGNOSIS — I482 Chronic atrial fibrillation, unspecified: Secondary | ICD-10-CM

## 2023-02-20 DIAGNOSIS — R600 Localized edema: Secondary | ICD-10-CM

## 2023-02-20 NOTE — Patient Instructions (Signed)
Medication Instructions:  Continue same medications *If you need a refill on your cardiac medications before your next appointment, please call your pharmacy*   Lab Work: None ordered   Testing/Procedures: None ordered   Follow-Up: At [facility], you and your health needs are our priority.  As part of our continuing mission to provide you with exceptional heart care, we have created designated Provider Care Teams.  These Care Teams include your primary Cardiologist (physician) and Advanced Practice Providers (APPs -  Physician Assistants and Nurse Practitioners) who all work together to provide you with the care you need, when you need it.  We recommend signing up for the patient portal called "MyChart".  Sign up information is provided on this After Visit Summary.  MyChart is used to connect with patients for Virtual Visits (Telemedicine).  Patients are able to view lab/test results, encounter notes, upcoming appointments, etc.  Non-urgent messages can be sent to your provider as well.   To learn more about what you can do with MyChart, go to [email or web address].    Your next appointment:  6 months    Provider:  Dr.Crenshaw   

## 2023-02-23 ENCOUNTER — Ambulatory Visit (INDEPENDENT_AMBULATORY_CARE_PROVIDER_SITE_OTHER): Payer: Medicare Other

## 2023-02-23 ENCOUNTER — Ambulatory Visit: Payer: Medicare Other | Admitting: Internal Medicine

## 2023-02-23 DIAGNOSIS — E538 Deficiency of other specified B group vitamins: Secondary | ICD-10-CM

## 2023-02-23 MED ORDER — CYANOCOBALAMIN 1000 MCG/ML IJ SOLN
1000.0000 ug | Freq: Once | INTRAMUSCULAR | Status: AC
Start: 2023-02-23 — End: 2023-02-23
  Administered 2023-02-23: 1000 ug via INTRAMUSCULAR

## 2023-02-23 NOTE — Progress Notes (Signed)
After obtaining consent, and per orders of Dr. Burns, injection of B12 given by Dorcus Riga P Ashaun Gaughan. Patient instructed to report any adverse reaction to me immediately.  

## 2023-03-05 ENCOUNTER — Encounter: Payer: Self-pay | Admitting: Internal Medicine

## 2023-03-06 ENCOUNTER — Other Ambulatory Visit: Payer: Self-pay | Admitting: Cardiology

## 2023-03-08 ENCOUNTER — Encounter: Payer: Self-pay | Admitting: Internal Medicine

## 2023-03-08 NOTE — Progress Notes (Unsigned)
    Subjective:    Patient ID: Grace Alvarado, female    DOB: [birth year], 89 y.o.   MRN: [id number]      HPI Kadasia is here for No chief complaint on file.   Acute RLE edema on chronic edema.  Having clear fluid drain from leg.  Takes lasix 20 mg daily - had her increase to 40 mg over the weekend.     Medications and allergies reviewed with patient and updated if appropriate.  Current Outpatient Medications on File Prior to Visit  Medication Sig Dispense Refill   acetaminophen (TYLENOL) 325 MG tablet Take 1-2 tablets (325-650 mg total) by mouth every 6 (six) hours as needed for mild pain (pain score 1-3 or temp > 100.5).     B Complex-C (B-COMPLEX WITH VITAMIN C) tablet Take 1 tablet by mouth daily.     diltiazem (CARDIZEM SR) 120 MG 12 hr capsule TAKE 1 CAPSULE BY MOUTH TWICE A DAY 180 capsule 3   furosemide (LASIX) 20 MG tablet Take 1 tablet (20 mg total) by mouth daily. 90 tablet 3   KLOR-CON M10 10 MEQ tablet TAKE 1 TABLET (10 MEQ TOTAL) BY MOUTH EVERY OTHER DAY. 45 tablet 3   levothyroxine (SYNTHROID) 75 MCG tablet TAKE 1 TABLET BY MOUTH EVERY DAY BEFORE BREAKFAST 90 tablet 1   pravastatin (PRAVACHOL) 10 MG tablet Take 1 tablet (10 mg total) by mouth daily. 90 tablet 3   XARELTO 15 MG TABS tablet TAKE 1 TABLET BY MOUTH EVERY DAY WITH SUPPER 90 tablet 1   No current facility-administered medications on file prior to visit.    Review of Systems     Objective:  There were no vitals filed for this visit. BP Readings from Last 3 Encounters:  02/20/23 131/62  12/23/22 120/72  11/20/22 (!) 128/50   Wt Readings from Last 3 Encounters:  02/20/23 145 lb 6.4 oz (66 kg)  12/23/22 143 lb (64.9 kg)  11/20/22 141 lb 9.6 oz (64.2 kg)   There is no height or weight on file to calculate BMI.    Physical Exam         Assessment & Plan:    See Problem List for Assessment and Plan of chronic medical problems.

## 2023-03-09 ENCOUNTER — Ambulatory Visit: Payer: Medicare Other | Admitting: Internal Medicine

## 2023-03-09 ENCOUNTER — Encounter: Payer: Self-pay | Admitting: Podiatry

## 2023-03-09 VITALS — BP 132/72 | HR 78 | Temp 98.0°F | Ht 63.0 in | Wt 142.8 lb

## 2023-03-09 DIAGNOSIS — G8929 Other chronic pain: Secondary | ICD-10-CM

## 2023-03-09 DIAGNOSIS — M549 Dorsalgia, unspecified: Secondary | ICD-10-CM

## 2023-03-09 DIAGNOSIS — R600 Localized edema: Secondary | ICD-10-CM

## 2023-03-09 DIAGNOSIS — R609 Edema, unspecified: Secondary | ICD-10-CM | POA: Diagnosis not present

## 2023-03-09 DIAGNOSIS — I10 Essential (primary) hypertension: Secondary | ICD-10-CM

## 2023-03-09 LAB — BASIC METABOLIC PANEL
BUN: 11 mg/dL (ref 6–23)
CO2: 30 mEq/L (ref 19–32)
Calcium: 9.5 mg/dL (ref 8.4–10.5)
Chloride: 101 mEq/L (ref 96–112)
Creatinine, Ser: 0.75 mg/dL (ref 0.40–1.20)
GFR: 67.57 mL/min (ref >60.00)
Glucose, Bld: 101 mg/dL — ABNORMAL HIGH (ref 70–99)
Potassium: 4.3 mEq/L (ref 3.5–5.1)
Sodium: 137 mEq/L (ref 135–145)

## 2023-03-09 NOTE — Patient Instructions (Addendum)
      Blood work was ordered.       Medications changes include :   take 40 mg of lasix tomorrow and 20 mEq of potassium (double both)      No follow-ups on file.

## 2023-03-09 NOTE — Assessment & Plan Note (Signed)
Chronic Blood pressure today well-controlled Continue diltiazem 120 mg twice daily  

## 2023-03-09 NOTE — Assessment & Plan Note (Signed)
Acute on chronic Last week advised her to take 40 mg of Lasix daily x 2 days and that did improve edema-initially did not think the right leg was still leaking, but there is an area between posterior leg small amount of fluid Near baseline No shortness of breath, chest pain or palpitations Will have her take 40 mg of Lasix tomorrow-double potassium at the same time and then resume Lasix 20 mg daily Discussed that she may need to periodically take 40 mg 4/3 once a week or once every 2-3 weeks to maintain her fluid balance 

## 2023-03-09 NOTE — Assessment & Plan Note (Signed)
Acute on chronic Last week advised her to take 40 mg of Lasix daily x 2 days and that did improve edema-initially did not think the right leg was still leaking, but there is an area between posterior leg small amount of fluid Near baseline No shortness of breath, chest pain or palpitations Will have her take 40 mg of Lasix tomorrow-double potassium at the same time and then resume Lasix 20 mg daily Discussed that she may need to periodically take 40 mg 4/3 once a week or once every 2-3 weeks to maintain her fluid balance

## 2023-03-09 NOTE — Assessment & Plan Note (Signed)
Chronic Taking Tylenol as needed She is having increased back pain the past few days Advised taking Tylenol up to 3000 mg daily on a consistent basis to help better control her pain-once her pain is controlled she can back off of the Tylenol

## 2023-03-11 ENCOUNTER — Ambulatory Visit: Payer: Medicare Other | Admitting: Podiatry

## 2023-03-17 ENCOUNTER — Ambulatory Visit: Payer: Medicare Other | Admitting: Podiatry

## 2023-03-17 ENCOUNTER — Encounter: Payer: Self-pay | Admitting: Internal Medicine

## 2023-03-17 DIAGNOSIS — Z7901 Long term (current) use of anticoagulants: Secondary | ICD-10-CM | POA: Diagnosis not present

## 2023-03-17 DIAGNOSIS — L84 Corns and callosities: Secondary | ICD-10-CM

## 2023-03-17 DIAGNOSIS — I739 Peripheral vascular disease, unspecified: Secondary | ICD-10-CM | POA: Diagnosis not present

## 2023-03-17 DIAGNOSIS — B351 Tinea unguium: Secondary | ICD-10-CM

## 2023-03-17 NOTE — Progress Notes (Signed)
       Subjective:  Patient ID: Grace Alvarado, female    DOB: [birth year],  MRN: [id number]  Miraya Cudney presents to clinic today for:  Chief Complaint  Patient presents with   Nail Problem    Rm 12 RFC bilaterlal nail trim    Callouses    Routine callus debridement.   . Patient notes nails are thick and elongated, causing pain in shoe gear when ambulating.  She has a painful callus underneath the left great toe joint.  She utilizes a walker to assist with ambulation.  PCP is Burns, Bobette Mo, MD.  Allergies  Allergen Reactions   Codeine Other (See Comments)    unknown   Penicillins Other (See Comments)    unknown   Vancomycin Other (See Comments)    unknown   Zithromax [Azithromycin] Other (See Comments)    unknown   Latex Rash    Review of Systems: Negative except as noted in the HPI.  Objective:  There were no vitals filed for this visit.  Clotine Heiner is a pleasant 89 y.o. female in NAD. AAO x 3.  Vascular Examination: Patient has palpable DP pulse, absent PT pulse bilateral.  Delayed capillary refill bilateral toes.  Sparse digital hair bilateral.  Proximal to distal cooling WNL bilateral.    Dermatological Examination: Interspaces are clear with no open lesions noted bilateral.  Nails are 3-52mm thick, with yellowish/brown discoloration, subungual debris and distal onycholysis x10.  There is pain with compression of nails x10.  There are hyperkeratotic lesions noted submet 1 left foot.  Neurological Examination: Protective sensation intact b/l LE.   Patient qualifies for at-risk foot care because of PVD, chronic anticoagulation therapy.  Assessment/Plan: 1. Dermatophytosis of nail   2. Callus of foot   3. PVD (peripheral vascular disease) (HCC)   4. Chronic anticoagulation     Mycotic nails x10 were sharply debrided with sterile nail nippers and power debriding burr to decrease bulk and length.  Hyperkeratotic lesions x 1 were shaved with #[id number]  blade.   Return in about 3 months (around 06/17/2023) for RFC.   Clerance Lav, DPM, FACFAS [facility]     [address] [place], [place] [address]                Office [phone]  Fax [phone]

## 2023-03-30 ENCOUNTER — Ambulatory Visit (INDEPENDENT_AMBULATORY_CARE_PROVIDER_SITE_OTHER): Payer: Medicare Other

## 2023-03-30 DIAGNOSIS — E538 Deficiency of other specified B group vitamins: Secondary | ICD-10-CM | POA: Diagnosis not present

## 2023-03-30 MED ORDER — CYANOCOBALAMIN 1000 MCG/ML IJ SOLN
1000.0000 ug | Freq: Once | INTRAMUSCULAR | Status: AC
Start: 2023-03-30 — End: 2023-03-30
  Administered 2023-03-30: 1000 ug via INTRAMUSCULAR

## 2023-03-30 NOTE — Progress Notes (Signed)
After obtaining consent, and per orders of Dr. Burns, injection of B12 given by Stefan Karen P Gena Laski. Patient instructed to report any adverse reaction to me immediately.  

## 2023-04-02 ENCOUNTER — Encounter: Payer: Self-pay | Admitting: Internal Medicine

## 2023-04-06 ENCOUNTER — Encounter: Payer: Self-pay | Admitting: Internal Medicine

## 2023-04-06 MED ORDER — POTASSIUM CHLORIDE CRYS ER 10 MEQ PO TBCR: EXTENDED_RELEASE_TABLET | ORAL | 3 refills | Status: DC

## 2023-04-11 ENCOUNTER — Inpatient Hospital Stay (HOSPITAL_COMMUNITY)
Admission: EM | Admit: 2023-04-11 | Discharge: 2023-04-15 | DRG: 392 | Disposition: A | Discharge status: Home / Self Care | Payer: Medicare Other | Attending: Family Medicine | Admitting: Family Medicine

## 2023-04-11 ENCOUNTER — Emergency Department (HOSPITAL_COMMUNITY): Payer: Medicare Other

## 2023-04-11 ENCOUNTER — Encounter (HOSPITAL_COMMUNITY): Payer: Self-pay | Admitting: Internal Medicine

## 2023-04-11 ENCOUNTER — Encounter: Payer: Self-pay | Admitting: Internal Medicine

## 2023-04-11 ENCOUNTER — Other Ambulatory Visit: Payer: Self-pay

## 2023-04-11 DIAGNOSIS — E785 Hyperlipidemia, unspecified: Secondary | ICD-10-CM | POA: Diagnosis present

## 2023-04-11 DIAGNOSIS — Z881 Allergy status to other antibiotic agents status: Secondary | ICD-10-CM | POA: Diagnosis not present

## 2023-04-11 DIAGNOSIS — Z66 Do not resuscitate: Secondary | ICD-10-CM | POA: Diagnosis present

## 2023-04-11 DIAGNOSIS — I482 Chronic atrial fibrillation, unspecified: Secondary | ICD-10-CM

## 2023-04-11 DIAGNOSIS — R933 Abnormal findings on diagnostic imaging of other parts of digestive tract: Secondary | ICD-10-CM | POA: Diagnosis not present

## 2023-04-11 DIAGNOSIS — D62 Acute posthemorrhagic anemia: Secondary | ICD-10-CM | POA: Diagnosis present

## 2023-04-11 DIAGNOSIS — E871 Hypo-osmolality and hyponatremia: Secondary | ICD-10-CM | POA: Diagnosis present

## 2023-04-11 DIAGNOSIS — I10 Essential (primary) hypertension: Secondary | ICD-10-CM | POA: Diagnosis present

## 2023-04-11 DIAGNOSIS — D696 Thrombocytopenia, unspecified: Secondary | ICD-10-CM | POA: Diagnosis not present

## 2023-04-11 DIAGNOSIS — R2243 Localized swelling, mass and lump, lower limb, bilateral: Secondary | ICD-10-CM | POA: Diagnosis present

## 2023-04-11 DIAGNOSIS — K529 Noninfective gastroenteritis and colitis, unspecified: Secondary | ICD-10-CM | POA: Diagnosis present

## 2023-04-11 DIAGNOSIS — Z880 Allergy status to penicillin: Secondary | ICD-10-CM | POA: Diagnosis not present

## 2023-04-11 DIAGNOSIS — Z885 Allergy status to narcotic agent status: Secondary | ICD-10-CM

## 2023-04-11 DIAGNOSIS — Z79890 Hormone replacement therapy (postmenopausal): Secondary | ICD-10-CM

## 2023-04-11 DIAGNOSIS — K921 Melena: Principal | ICD-10-CM

## 2023-04-11 DIAGNOSIS — Z91040 Latex allergy status: Secondary | ICD-10-CM | POA: Diagnosis not present

## 2023-04-11 DIAGNOSIS — R1084 Generalized abdominal pain: Secondary | ICD-10-CM | POA: Diagnosis not present

## 2023-04-11 DIAGNOSIS — Z79899 Other long term (current) drug therapy: Secondary | ICD-10-CM

## 2023-04-11 DIAGNOSIS — E039 Hypothyroidism, unspecified: Secondary | ICD-10-CM | POA: Diagnosis present

## 2023-04-11 DIAGNOSIS — I509 Heart failure, unspecified: Secondary | ICD-10-CM | POA: Diagnosis not present

## 2023-04-11 DIAGNOSIS — Z87891 Personal history of nicotine dependence: Secondary | ICD-10-CM | POA: Diagnosis not present

## 2023-04-11 DIAGNOSIS — K449 Diaphragmatic hernia without obstruction or gangrene: Secondary | ICD-10-CM | POA: Diagnosis present

## 2023-04-11 DIAGNOSIS — I4821 Permanent atrial fibrillation: Secondary | ICD-10-CM | POA: Diagnosis present

## 2023-04-11 DIAGNOSIS — Z7901 Long term (current) use of anticoagulants: Secondary | ICD-10-CM

## 2023-04-11 DIAGNOSIS — D509 Iron deficiency anemia, unspecified: Secondary | ICD-10-CM

## 2023-04-11 DIAGNOSIS — A09 Infectious gastroenteritis and colitis, unspecified: Secondary | ICD-10-CM | POA: Diagnosis present

## 2023-04-11 DIAGNOSIS — I110 Hypertensive heart disease with heart failure: Secondary | ICD-10-CM | POA: Diagnosis not present

## 2023-04-11 DIAGNOSIS — E876 Hypokalemia: Secondary | ICD-10-CM | POA: Diagnosis present

## 2023-04-11 DIAGNOSIS — K922 Gastrointestinal hemorrhage, unspecified: Secondary | ICD-10-CM | POA: Diagnosis present

## 2023-04-11 DIAGNOSIS — D649 Anemia, unspecified: Secondary | ICD-10-CM

## 2023-04-11 LAB — CBC WITH DIFFERENTIAL/PLATELET
Abs Immature Granulocytes: 0.04 10*3/uL (ref 0.00–0.07)
Basophils Absolute: 0 10*3/uL (ref 0.0–0.1)
Basophils Relative: 0 %
Eosinophils Absolute: 0 10*3/uL (ref 0.0–0.5)
Eosinophils Relative: 0 %
HCT: 30.8 % — ABNORMAL LOW (ref 36.0–46.0)
Hemoglobin: 9.6 g/dL — ABNORMAL LOW (ref 12.0–15.0)
Immature Granulocytes: 0 %
Lymphocytes Relative: 11 %
Lymphs Abs: 1.2 10*3/uL (ref 0.7–4.0)
MCH: 28.3 pg (ref 26.0–34.0)
MCHC: 31.2 g/dL (ref 30.0–36.0)
MCV: 90.9 fL (ref 80.0–100.0)
Monocytes Absolute: 1.1 10*3/uL — ABNORMAL HIGH (ref 0.1–1.0)
Monocytes Relative: 10 %
Neutro Abs: 8.8 10*3/uL — ABNORMAL HIGH (ref 1.7–7.7)
Neutrophils Relative %: 79 %
Platelets: 161 10*3/uL (ref 150–400)
RBC: 3.39 MIL/uL — ABNORMAL LOW (ref 3.87–5.11)
RDW: 15.3 % (ref 11.5–15.5)
WBC: 11.2 10*3/uL — ABNORMAL HIGH (ref 4.0–10.5)
nRBC: 0 % (ref 0.0–0.2)

## 2023-04-11 LAB — URINALYSIS, ROUTINE W REFLEX MICROSCOPIC
Bacteria, UA: NONE SEEN
Bilirubin Urine: NEGATIVE
Glucose, UA: NEGATIVE mg/dL
Ketones, ur: NEGATIVE mg/dL
Leukocytes,Ua: NEGATIVE
Nitrite: NEGATIVE
Protein, ur: NEGATIVE mg/dL
Specific Gravity, Urine: >1.046 — ABNORMAL HIGH (ref 1.005–1.030)
pH: 7 (ref 5.0–8.0)

## 2023-04-11 LAB — COMPREHENSIVE METABOLIC PANEL
ALT: 23 U/L (ref 0–44)
AST: 26 U/L (ref 15–41)
Albumin: 3.7 g/dL (ref 3.5–5.0)
Alkaline Phosphatase: 70 U/L (ref 38–126)
Anion gap: 7 (ref 5–15)
BUN: 13 mg/dL (ref 8–23)
CO2: 26 mmol/L (ref 22–32)
Calcium: 9.1 mg/dL (ref 8.9–10.3)
Chloride: 98 mmol/L (ref 98–111)
Creatinine, Ser: 0.78 mg/dL (ref 0.44–1.00)
GFR, Estimated: >60 mL/min (ref >60)
Glucose, Bld: 114 mg/dL — ABNORMAL HIGH (ref 70–99)
Potassium: 3.6 mmol/L (ref 3.5–5.1)
Sodium: 131 mmol/L — ABNORMAL LOW (ref 135–145)
Total Bilirubin: 0.9 mg/dL (ref 0.3–1.2)
Total Protein: 7.5 g/dL (ref 6.5–8.1)

## 2023-04-11 LAB — TYPE AND SCREEN
ABO/RH(D): A POS
Antibody Screen: NEGATIVE

## 2023-04-11 LAB — POC OCCULT BLOOD, ED: Fecal Occult Bld: POSITIVE — AB

## 2023-04-11 MED ORDER — DILTIAZEM HCL ER 60 MG PO CP12
120.0000 mg | ORAL_CAPSULE | Freq: Two times a day (BID) | ORAL | Status: DC
  Administered 2023-04-11 – 2023-04-15 (×8): 120 mg via ORAL
  Filled 2023-04-11 (×8): qty 2

## 2023-04-11 MED ORDER — SODIUM CHLORIDE 0.9 % IV SOLN: INTRAVENOUS | Status: DC

## 2023-04-11 MED ORDER — PANTOPRAZOLE SODIUM 40 MG IV SOLR
40.0000 mg | Freq: Once | INTRAVENOUS | Status: AC
  Administered 2023-04-11: 40 mg via INTRAVENOUS
  Filled 2023-04-11: qty 10

## 2023-04-11 MED ORDER — IOHEXOL 300 MG/ML  SOLN
100.0000 mL | Freq: Once | INTRAMUSCULAR | Status: AC | PRN
  Administered 2023-04-11: 100 mL via INTRAVENOUS

## 2023-04-11 MED ORDER — FUROSEMIDE 20 MG PO TABS
20.0000 mg | ORAL_TABLET | Freq: Every day | ORAL | Status: DC
  Administered 2023-04-12: 20 mg via ORAL
  Filled 2023-04-11: qty 1

## 2023-04-11 MED ORDER — ONDANSETRON HCL 4 MG/2ML IJ SOLN: 4.0000 mg | Freq: Four times a day (QID) | INTRAMUSCULAR | Status: DC | PRN

## 2023-04-11 MED ORDER — ACETAMINOPHEN 650 MG RE SUPP: 650.0000 mg | Freq: Four times a day (QID) | RECTAL | Status: DC | PRN

## 2023-04-11 MED ORDER — PANTOPRAZOLE SODIUM 40 MG IV SOLR
40.0000 mg | INTRAVENOUS | Status: DC
  Administered 2023-04-12 – 2023-04-15 (×4): 40 mg via INTRAVENOUS
  Filled 2023-04-11 (×4): qty 10

## 2023-04-11 MED ORDER — TRAZODONE HCL 50 MG PO TABS
25.0000 mg | ORAL_TABLET | Freq: Every evening | ORAL | Status: DC | PRN
  Administered 2023-04-13: 25 mg via ORAL
  Filled 2023-04-11: qty 1

## 2023-04-11 MED ORDER — ONDANSETRON HCL 4 MG PO TABS: 4.0000 mg | ORAL_TABLET | Freq: Four times a day (QID) | ORAL | Status: DC | PRN

## 2023-04-11 MED ORDER — PRAVASTATIN SODIUM 20 MG PO TABS
10.0000 mg | ORAL_TABLET | Freq: Every day | ORAL | Status: DC
  Administered 2023-04-11 – 2023-04-15 (×5): 10 mg via ORAL
  Filled 2023-04-11 (×5): qty 1

## 2023-04-11 MED ORDER — ACETAMINOPHEN 325 MG PO TABS: 650.0000 mg | ORAL_TABLET | Freq: Four times a day (QID) | ORAL | Status: DC | PRN

## 2023-04-11 MED ORDER — ALBUTEROL SULFATE (2.5 MG/3ML) 0.083% IN NEBU: 2.5000 mg | INHALATION_SOLUTION | RESPIRATORY_TRACT | Status: DC | PRN

## 2023-04-11 MED ORDER — LEVOTHYROXINE SODIUM 50 MCG PO TABS
75.0000 ug | ORAL_TABLET | Freq: Every day | ORAL | Status: DC
  Administered 2023-04-12 – 2023-04-15 (×4): 75 ug via ORAL
  Filled 2023-04-11 (×4): qty 1

## 2023-04-11 NOTE — ED Notes (Signed)
Patient transported to CT 

## 2023-04-11 NOTE — H&P (Signed)
History and Physical  KRISTIANN COTTERMAN ZOX:[id number] DOB: March 22, 1925 DOA: 04/11/2023  PCP: Pincus Sanes, MD   Chief Complaint: Dark stools  HPI: Grace Alvarado is a 89 y.o. female with medical history significant for permanent atrial fibrillation on Xarelto, hypertension, hypothyroidism being admitted to the hospital with melena and blood loss anemia.  History is provided by the patient as well as her daughter who was at the bedside.  Patient has been in her usual state of health until last evening, when she had some mild epigastric discomfort, and a dark bowel movement.  Through the night, she had a total of about 4 bowel movements which were melanotic, without any hematochezia or frank blood.  He had some mild nausea, but denies any fevers, vomiting, diarrhea, cough, shortness of breath, dizziness, or syncope.  ED Course: Evaluation in the ER demonstrates normal vital signs.  Lab work shows hemoglobin of 9.6 from baseline of 12. FOBT in the ER was positive. ER MD discussed with [facility] who will consult.  Review of Systems: Please see HPI for pertinent positives and negatives. A complete 10 system review of systems are otherwise negative.  Past Medical History:  Diagnosis Date   Atrial fibrillation (HCC)    Hyperlipidemia    Hypertension    Hypothyroidism    Mitral regurgitation    MVP (mitral valve prolapse)    Past Surgical History:  Procedure Laterality Date   APPENDECTOMY     c section     CESAREAN SECTION     CHOLECYSTECTOMY     COLONOSCOPY WITH PROPOFOL N/A 11/09/2018   Procedure: COLONOSCOPY WITH PROPOFOL;  Surgeon: Sherrilyn Rist, MD;  Location: [facility] ENDOSCOPY;  Service: Gastroenterology;  Laterality: N/A;   ESOPHAGOGASTRODUODENOSCOPY (EGD) WITH PROPOFOL N/A 11/09/2018   Procedure: ESOPHAGOGASTRODUODENOSCOPY (EGD) WITH PROPOFOL;  Surgeon: Sherrilyn Rist, MD;  Location: [facility] ENDOSCOPY;  Service: Gastroenterology;  Laterality: N/A;   HOT HEMOSTASIS N/A 11/09/2018    Procedure: HOT HEMOSTASIS (ARGON PLASMA COAGULATION/BICAP);  Surgeon: Sherrilyn Rist, MD;  Location: [facility] ENDOSCOPY;  Service: Gastroenterology;  Laterality: N/A;   I & D EXTREMITY Left 03/02/2020   Procedure: evacuation of left knee hematoma, possible wound vac placement;  Surgeon: Cammy Copa, MD;  Location: [facility] OR;  Service: Orthopedics;  Laterality: Left;   SUBMUCOSAL INJECTION  11/09/2018   Procedure: SUBMUCOSAL INJECTION;  Surgeon: Sherrilyn Rist, MD;  Location: [facility] ENDOSCOPY;  Service: Gastroenterology;;    Social History:  reports that she has quit smoking. She has never used smokeless tobacco. She reports that she does not currently use alcohol. She reports that she does not use drugs.   Allergies  Allergen Reactions   Codeine Other (See Comments)    unknown   Penicillins Other (See Comments)    unknown   Vancomycin Other (See Comments)    unknown   Zithromax [Azithromycin] Other (See Comments)    unknown   Latex Rash    Family History  Problem Relation Age of Onset   Cancer Mother    Dementia Mother      Prior to Admission medications   Medication Sig Start Date End Date Taking? Authorizing Provider  acetaminophen (TYLENOL) 325 MG tablet Take 1-2 tablets (325-650 mg total) by mouth every 6 (six) hours as needed for mild pain (pain score 1-3 or temp > 100.5). 03/08/20   Joseph Art, DO  B Complex-C (B-COMPLEX WITH VITAMIN C) tablet Take 1 tablet by mouth daily. 03/08/20  Marlin Canary U, DO  diltiazem (CARDIZEM SR) 120 MG 12 hr capsule TAKE 1 CAPSULE BY MOUTH TWICE A DAY 11/03/22   Lewayne Bunting, MD  furosemide (LASIX) 20 MG tablet Take 1 tablet (20 mg total) by mouth daily. 12/16/22   Pincus Sanes, MD  levothyroxine (SYNTHROID) 75 MCG tablet TAKE 1 TABLET BY MOUTH EVERY DAY BEFORE BREAKFAST 12/16/22   Burns, Bobette Mo, MD  potassium chloride (KLOR-CON M10) 10 MEQ tablet TAKE 1-2 TABLETS BY MOUTH EVERY DAY. 04/06/23   Pincus Sanes, MD  pravastatin (PRAVACHOL)  10 MG tablet Take 1 tablet (10 mg total) by mouth daily. 11/03/22   Lewayne Bunting, MD  XARELTO 15 MG TABS tablet TAKE 1 TABLET BY MOUTH EVERY DAY WITH SUPPER 12/16/22   Lewayne Bunting, MD    Physical Exam: BP (!) 127/56 (BP Location: Left Arm)   Pulse 86   Temp 99.1 F (37.3 C) (Oral)   Resp 20   Ht 5' 2.5" (1.588 m)   Wt 64.4 kg   LMP  (LMP Unknown)   SpO2 99%   BMI 25.56 kg/m   General:  Alert, oriented, calm, in no acute distress, looks younger than her stated age  Eyes: EOMI, clear conjuctivae, white sclerea Neck: supple, no masses, trachea mildline  Cardiovascular: RRR, no murmurs or rubs, no peripheral edema  Respiratory: clear to auscultation bilaterally, no wheezes, no crackles  Abdomen: soft, tender epigastrum, nondistended, normal bowel tones heard  Skin: dry, no rashes  Musculoskeletal: no joint effusions, normal range of motion  Psychiatric: appropriate affect, normal speech  Neurologic: extraocular muscles intact, clear speech, moving all extremities with intact sensorium          Labs on Admission:  Basic Metabolic Panel: Recent Labs  Lab 04/11/23 1343  NA 131*  K 3.6  CL 98  CO2 26  GLUCOSE 114*  BUN 13  CREATININE 0.78  CALCIUM 9.1   Liver Function Tests: Recent Labs  Lab 04/11/23 1343  AST 26  ALT 23  ALKPHOS 70  BILITOT 0.9  PROT 7.5  ALBUMIN 3.7   No results for input(s): "LIPASE", "AMYLASE" in the last 168 hours. No results for input(s): "AMMONIA" in the last 168 hours. CBC: Recent Labs  Lab 04/11/23 1343  WBC 11.2*  NEUTROABS 8.8*  HGB 9.6*  HCT 30.8*  MCV 90.9  PLT 161   Cardiac Enzymes: No results for input(s): "CKTOTAL", "CKMB", "CKMBINDEX", "TROPONINI" in the last 168 hours.  BNP (last 3 results) Recent Labs    11/20/22 0941  BNP 271.3*    ProBNP (last 3 results) Recent Labs    10/22/22 1138  PROBNP 380.0*    CBG: No results for input(s): "GLUCAP" in the last 168 hours.  Radiological Exams on  Admission: CT ABDOMEN PELVIS W CONTRAST  Result Date: 04/11/2023 CLINICAL DATA:  Abdominal pain with blood in stools. EXAM: CT ABDOMEN AND PELVIS WITH CONTRAST TECHNIQUE: Multidetector CT imaging of the abdomen and pelvis was performed using the standard protocol following bolus administration of intravenous contrast. RADIATION DOSE REDUCTION: This exam was performed according to the departmental dose-optimization program which includes automated exposure control, adjustment of the mA and/or kV according to patient size and/or use of iterative reconstruction technique. CONTRAST:  OMNIPAQUE IOHEXOL 300 MG/ML  SOLN COMPARISON:  11/06/2018 FINDINGS: Lower chest: The heart is enlarged. Tiny right pleural effusion with dependent atelectasis in both lung bases. Hepatobiliary: No suspicious focal abnormality within the liver parenchyma. Cholecystectomy. Common  bile duct measures 9 mm diameter just proximal to the ampulla, upper normal for patient age in the setting of cholecystectomy. Pancreas: No focal mass lesion. No dilatation of the main duct. No intraparenchymal cyst. No peripancreatic edema. Spleen: No splenomegaly. No suspicious focal mass lesion. Adrenals/Urinary Tract: No adrenal nodule or mass. Kidneys unremarkable. Cortical scarring noted bilaterally. No evidence for hydroureter. The urinary bladder appears normal for the degree of distention. Stomach/Bowel: Stomach is unremarkable. No gastric wall thickening. No evidence of outlet obstruction. Duodenum is normally positioned as is the ligament of Treitz. No small bowel wall thickening. No small bowel dilatation. Diverticular are seen scattered along the length of the terminal ileum, otherwise unremarkable. Nonvisualization of the appendix is consistent with the reported history of appendectomy. Marked edema and circumferential wall thickening noted cecum and ascending colon extending into the hepatic flexure. There is pericolonic edema/inflammation  transverse colon splenic flexure, and left colon are unremarkable aside from some diverticulosis. Vascular/Lymphatic: There is moderate atherosclerotic calcification of the abdominal aorta without aneurysm. Portal vein and superior mesenteric vein are patent. Celiac axis and SMA opacified normally. IMA is opacified. No abdominal lymphadenopathy. No pelvic lymphadenopathy. Reproductive: The uterus is unremarkable.  There is no adnexal mass. Other: No intraperitoneal free fluid. Musculoskeletal: No worrisome lytic or sclerotic osseous abnormality. Mild compression deformity noted at T11 and L1. IMPRESSION: 1. Marked edema and circumferential wall thickening involving the cecum and ascending colon extending into the hepatic flexure. Imaging features are compatible with colitis, likely infectious/inflammatory. Despite the diffuse colonic diverticulosis, imaging features are not highly suggestive of diverticulitis. 2. Tiny right pleural effusion with dependent atelectasis in both lung bases. 3.  Aortic Atherosclerosis (ICD10-I70.0). Electronically Signed   By: Kennith Center M.D.   On: 04/11/2023 15:29    Assessment/Plan 89 year old female with a history of hypothyroidism, permanent atrial fibrillation on Xarelto being admitted to the hospital with melena concerning for possible upper GI bleed.  Melena-with mild epigastric abdominal pain, concerning for potential upper GI bleed.  Patient is hemodynamically stable. -Inpatient admission to progressive unit -Hold blood thinners, she last took Xarelto last evening -Clear liquid diet, keep n.p.o. after midnight in case of endoscopy -IV PPI daily -Discussed with gastroenterology Dr. Meridee Score,  GI will consult in the morning  Blood loss anemia-hemoglobin has fallen to 9 from baseline of 12 -Will transfuse to keep hemoglobin greater than 7  Permanent Afib - continue Cardizem BID, hold Xarelto  Colonic Edema and Wall thickening - concerning for colitis,  however patient is afebrile without significant abdominal pain, leukocytosis or diarrhea.  Hypothyroidism - continue Synthroid  DVT prophylaxis: SCDs only    Code Status: DNR, confirmed with patient with her daughter at the bedside, at the time of admission.  Consults called: GI Dr. Meridee Score contacted by EDP, I also discussed via secure chat.  Admission status:  the appropriate patient status for this patient is INPATIENT. Inpatient status is judged to be reasonable and necessary in order to provide the required intensity of service to ensure the patient's safety. The patient's presenting symptoms, physical exam findings, and initial radiographic and laboratory data in the context of their chronic comorbidities is felt to place them at high risk for further clinical deterioration. Furthermore, it is not anticipated that the patient will be medically stable for discharge from the hospital within 2 midnights of admission.    I certify that at the point of admission it is my clinical judgment that the patient will require inpatient hospital care spanning beyond  2 midnights from the point of admission due to high intensity of service, high risk for further deterioration and high frequency of surveillance required  Time spent: 56 minutes  Alante Weimann Sharlette Dense MD [facility] Pager [phone]  If 7PM-7AM, please contact night-coverage [email or web address] Password [facility]  04/11/2023, 4:34 PM

## 2023-04-11 NOTE — ED Triage Notes (Signed)
Patient reports 3-4 bowel movements since last night with blood Dark colored blood Patient reports abd pain around belly button  Pain rated 10/10

## 2023-04-11 NOTE — ED Provider Notes (Signed)
[facility] AT [facility] Provider Note   CSN: [id number] Arrival date & time: 04/11/23  1300     History  Chief Complaint  Patient presents with   Blood In Stools    Grace Alvarado is a 89 y.o. female.  Patient is a 89 year old female with a past medical history of A-fib on Xarelto and hypothyroidism presenting to the emergency department for presumed GI bleed.  The patient states for the last 2 days she has had black appearing stools.  She states that she has had associated periumbilical pain.  She reported some nausea last night but denies any vomiting.  She denies any fevers or chills.  She states that she did have a colonoscopy few years ago when she was admitted for GI bleed and that they were able to find a spot that was bleeding and stop it and she did not have any problems since then.  The history is provided by the patient and a relative.       Home Medications Prior to Admission medications   Medication Sig Start Date End Date Taking? Authorizing Provider  acetaminophen (TYLENOL) 325 MG tablet Take 1-2 tablets (325-650 mg total) by mouth every 6 (six) hours as needed for mild pain (pain score 1-3 or temp > 100.5). 03/08/20   Joseph Art, DO  B Complex-C (B-COMPLEX WITH VITAMIN C) tablet Take 1 tablet by mouth daily. 03/08/20   Joseph Art, DO  diltiazem (CARDIZEM SR) 120 MG 12 hr capsule TAKE 1 CAPSULE BY MOUTH TWICE A DAY 11/03/22   Lewayne Bunting, MD  furosemide (LASIX) 20 MG tablet Take 1 tablet (20 mg total) by mouth daily. 12/16/22   Pincus Sanes, MD  levothyroxine (SYNTHROID) 75 MCG tablet TAKE 1 TABLET BY MOUTH EVERY DAY BEFORE BREAKFAST 12/16/22   Burns, Bobette Mo, MD  potassium chloride (KLOR-CON M10) 10 MEQ tablet TAKE 1-2 TABLETS BY MOUTH EVERY DAY. 04/06/23   Pincus Sanes, MD  pravastatin (PRAVACHOL) 10 MG tablet Take 1 tablet (10 mg total) by mouth daily. 11/03/22   Lewayne Bunting, MD  XARELTO 15 MG TABS tablet TAKE 1  TABLET BY MOUTH EVERY DAY WITH SUPPER 12/16/22   Lewayne Bunting, MD      Allergies    Codeine, Penicillins, Vancomycin, Zithromax [azithromycin], and Latex    Review of Systems   Review of Systems  Physical Exam Updated Vital Signs BP (!) 127/56 (BP Location: Left Arm)   Pulse 86   Temp 99.1 F (37.3 C) (Oral)   Resp 20   Ht 5' 2.5" (1.588 m)   Wt 64.4 kg   LMP  (LMP Unknown)   SpO2 99%   BMI 25.56 kg/m  Physical Exam Vitals and nursing note reviewed. Exam conducted with a chaperone present Armanda Heritage Brunson RN).  Constitutional:      Appearance: Normal appearance.  HENT:     Head: Normocephalic and atraumatic.     Nose: Nose normal.     Mouth/Throat:     Mouth: Mucous membranes are moist.     Pharynx: Oropharynx is clear.  Eyes:     Extraocular Movements: Extraocular movements intact.     Comments: Mildly pale conjunctiva  Cardiovascular:     Rate and Rhythm: Normal rate and regular rhythm.     Heart sounds: Normal heart sounds.  Pulmonary:     Effort: Pulmonary effort is normal.     Breath sounds: Normal breath sounds.  Abdominal:     General: Abdomen is flat.     Palpations: Abdomen is soft.     Tenderness: There is abdominal tenderness (Periumbilical/epigastric). There is no guarding or rebound.  Genitourinary:    Rectum: Guaiac result positive (Melanotic stool).  Musculoskeletal:        General: Normal range of motion.     Cervical back: Normal range of motion and neck supple.  Skin:    General: Skin is warm and dry.  Neurological:     General: No focal deficit present.     Mental Status: She is alert and oriented to person, place, and time.  Psychiatric:        Mood and Affect: Mood normal.        Behavior: Behavior normal.     ED Results / Procedures / Treatments   Labs (all labs ordered are listed, but only abnormal results are displayed) Labs Reviewed  COMPREHENSIVE METABOLIC PANEL - Abnormal; Notable for the following components:       Result Value   Sodium 131 (*)    Glucose, Bld 114 (*)    All other components within normal limits  CBC WITH DIFFERENTIAL/PLATELET - Abnormal; Notable for the following components:   WBC 11.2 (*)    RBC 3.39 (*)    Hemoglobin 9.6 (*)    HCT 30.8 (*)    Neutro Abs 8.8 (*)    Monocytes Absolute 1.1 (*)    All other components within normal limits  POC OCCULT BLOOD, ED - Abnormal; Notable for the following components:   Fecal Occult Bld POSITIVE (*)    All other components within normal limits  URINALYSIS, ROUTINE W REFLEX MICROSCOPIC  TYPE AND SCREEN    EKG None  Radiology CT ABDOMEN PELVIS W CONTRAST  Result Date: 04/11/2023 CLINICAL DATA:  Abdominal pain with blood in stools. EXAM: CT ABDOMEN AND PELVIS WITH CONTRAST TECHNIQUE: Multidetector CT imaging of the abdomen and pelvis was performed using the standard protocol following bolus administration of intravenous contrast. RADIATION DOSE REDUCTION: This exam was performed according to the departmental dose-optimization program which includes automated exposure control, adjustment of the mA and/or kV according to patient size and/or use of iterative reconstruction technique. CONTRAST:  OMNIPAQUE IOHEXOL 300 MG/ML  SOLN COMPARISON:  11/06/2018 FINDINGS: Lower chest: The heart is enlarged. Tiny right pleural effusion with dependent atelectasis in both lung bases. Hepatobiliary: No suspicious focal abnormality within the liver parenchyma. Cholecystectomy. Common bile duct measures 9 mm diameter just proximal to the ampulla, upper normal for patient age in the setting of cholecystectomy. Pancreas: No focal mass lesion. No dilatation of the main duct. No intraparenchymal cyst. No peripancreatic edema. Spleen: No splenomegaly. No suspicious focal mass lesion. Adrenals/Urinary Tract: No adrenal nodule or mass. Kidneys unremarkable. Cortical scarring noted bilaterally. No evidence for hydroureter. The urinary bladder appears normal for the  degree of distention. Stomach/Bowel: Stomach is unremarkable. No gastric wall thickening. No evidence of outlet obstruction. Duodenum is normally positioned as is the ligament of Treitz. No small bowel wall thickening. No small bowel dilatation. Diverticular are seen scattered along the length of the terminal ileum, otherwise unremarkable. Nonvisualization of the appendix is consistent with the reported history of appendectomy. Marked edema and circumferential wall thickening noted cecum and ascending colon extending into the hepatic flexure. There is pericolonic edema/inflammation transverse colon splenic flexure, and left colon are unremarkable aside from some diverticulosis. Vascular/Lymphatic: There is moderate atherosclerotic calcification of the abdominal aorta without aneurysm. Portal vein  and superior mesenteric vein are patent. Celiac axis and SMA opacified normally. IMA is opacified. No abdominal lymphadenopathy. No pelvic lymphadenopathy. Reproductive: The uterus is unremarkable.  There is no adnexal mass. Other: No intraperitoneal free fluid. Musculoskeletal: No worrisome lytic or sclerotic osseous abnormality. Mild compression deformity noted at T11 and L1. IMPRESSION: 1. Marked edema and circumferential wall thickening involving the cecum and ascending colon extending into the hepatic flexure. Imaging features are compatible with colitis, likely infectious/inflammatory. Despite the diffuse colonic diverticulosis, imaging features are not highly suggestive of diverticulitis. 2. Tiny right pleural effusion with dependent atelectasis in both lung bases. 3.  Aortic Atherosclerosis (ICD10-I70.0). Electronically Signed   By: Kennith Center M.D.   On: 04/11/2023 15:29    Procedures Procedures    Medications Ordered in ED Medications  pantoprazole (PROTONIX) injection 40 mg (40 mg Intravenous Given 04/11/23 1404)  iohexol (OMNIPAQUE) 300 MG/ML solution 100 mL (100 mLs Intravenous Contrast Given 04/11/23  1449)    ED Course/ Medical Decision Making/ A&P Clinical Course as of 04/11/23 1604  Sat Apr 11, 2023  1401 Hgb 9.6 from baseline ~12.5 5 months ago with hemoccult positive stool. [VK]  1545 CTAP with colitis, no evidence of diverticulitis. Will consult GI for disposition recommendations. [VK]  1555 I spoke with Willette Cluster PA with [facility] who recommended admission, CLD and NPO after MN.  [VK]    Clinical Course User Index [VK] Rexford Maus, DO                              Medical Decision Making This patient presents to the ED with chief complaint(s) of concern for GI bleed with pertinent past medical history of a fib on Xarelto, hypothyroidism which further complicates the presenting complaint. The complaint involves an extensive differential diagnosis and also carries with it a high risk of complications and morbidity.    The differential diagnosis includes upper versus lower GI bleed, anemia, coagulopathy, dehydration, electrolyte abnormality, diverticulosis versus diverticulitis  Additional history obtained: Additional history obtained from family Records reviewed previous admission documents - Angioectasia and diverticuli on last colonoscopy, hiatal hernia without other abnormality on last EGD in 2020  ED Course and Reassessment: On patient's arrival to the emergency department she is hemodynamically stable in no acute distress.  The patient did have Hemoccult performed that showed black melanotic appearing stool.  She will have labs performed to evaluate for anemia or coagulopathy as well as CT scan in the setting of her abdominal pain.  She will be given PPI with her pain with GI bleed and will be closely reassessed.  Independent labs interpretation:  The following labs were independently interpreted: anemia with hgb 9.6 from baseline 12.5  Independent visualization of imaging: - I independently visualized the following imaging with scope of interpretation  limited to determining acute life threatening conditions related to emergency care: CTAP, which revealed colitis  Consultation: - Consulted or discussed management/test interpretation w/ external professional: GI, hospitalist  Consideration for admission or further workup: patient requires admission for her GI bleed in the setting of Xarelto with anemia Social Determinants of health: N/A    Amount and/or Complexity of Data Reviewed Labs: ordered. Radiology: ordered.  Risk Prescription drug management. Decision regarding hospitalization.          Final Clinical Impression(s) / ED Diagnoses Final diagnoses:  Gastrointestinal hemorrhage with melena  Anemia, unspecified type  Colitis    Rx / DC  Orders ED Discharge Orders     None         Rexford Maus, [place] 04/11/23 1604

## 2023-04-11 NOTE — Plan of Care (Signed)

## 2023-04-11 NOTE — ED Notes (Signed)
ED TO INPATIENT HANDOFF REPORT  ED Nurse Name and Phone #: Crist Infante, RN [phone]  S Name/Age/Gender Grace Alvarado 89 y.o. female Room/Bed: WA15/WA15  Code Status   Code Status: DNR  Home/SNF/Other Home Patient oriented to: self, place, time, and situation Is this baseline? Yes   Triage Complete: Triage complete  Chief Complaint Upper GI bleed [K92.2]  Triage Note Patient reports 3-4 bowel movements since last night with blood Dark colored blood Patient reports abd pain around belly button  Pain rated 10/10    Allergies Allergies  Allergen Reactions   Codeine Other (See Comments)    unknown   Penicillins Other (See Comments)    unknown   Vancomycin Other (See Comments)    unknown   Zithromax [Azithromycin] Other (See Comments)    unknown   Latex Rash    Level of Care/Admitting Diagnosis ED Disposition     ED Disposition  Admit   Condition  --   Comment  Hospital Area: [facility] [[id number]]  Level of Care: Progressive [102]  Admit to Progressive based on following criteria: GI, ENDOCRINE disease patients with GI bleeding, acute liver failure or pancreatitis, stable with diabetic ketoacidosis or thyrotoxicosis (hypothyroid) state.  May admit patient to [facility] or [facility] if equivalent level of care is available:: Yes  Covid Evaluation: Asymptomatic - no recent exposure (last 10 days) testing not required  Diagnosis: Upper GI bleed [267195]  Admitting Physician: Maryln Gottron [[id number]]  Attending Physician: Kirby Crigler, MIR [email or web address] [[id number]]  Certification:: I certify this patient will need inpatient services for at least 2 midnights  Estimated Length of Stay: 3          B Medical/Surgery History Past Medical History:  Diagnosis Date   Atrial fibrillation (HCC)    Hyperlipidemia    Hypertension    Hypothyroidism    Mitral regurgitation    MVP (mitral valve prolapse)    Past Surgical History:  Procedure Laterality Date    APPENDECTOMY     c section     CESAREAN SECTION     CHOLECYSTECTOMY     COLONOSCOPY WITH PROPOFOL N/A 11/09/2018   Procedure: COLONOSCOPY WITH PROPOFOL;  Surgeon: Sherrilyn Rist, MD;  Location: [facility] ENDOSCOPY;  Service: Gastroenterology;  Laterality: N/A;   ESOPHAGOGASTRODUODENOSCOPY (EGD) WITH PROPOFOL N/A 11/09/2018   Procedure: ESOPHAGOGASTRODUODENOSCOPY (EGD) WITH PROPOFOL;  Surgeon: Sherrilyn Rist, MD;  Location: [facility] ENDOSCOPY;  Service: Gastroenterology;  Laterality: N/A;   HOT HEMOSTASIS N/A 11/09/2018   Procedure: HOT HEMOSTASIS (ARGON PLASMA COAGULATION/BICAP);  Surgeon: Sherrilyn Rist, MD;  Location: [facility] ENDOSCOPY;  Service: Gastroenterology;  Laterality: N/A;   I & D EXTREMITY Left 03/02/2020   Procedure: evacuation of left knee hematoma, possible wound vac placement;  Surgeon: Cammy Copa, MD;  Location: [facility] OR;  Service: Orthopedics;  Laterality: Left;   SUBMUCOSAL INJECTION  11/09/2018   Procedure: SUBMUCOSAL INJECTION;  Surgeon: Sherrilyn Rist, MD;  Location: [facility] ENDOSCOPY;  Service: Gastroenterology;;     A IV Location/Drains/Wounds Patient Lines/Drains/Airways Status     Active Line/Drains/Airways     Name Placement date Placement time Site Days   Peripheral IV 04/11/23 20 G 1" Right Antecubital 04/11/23  1343  Antecubital  less than 1   Urethral Catheter Wendy Poet RN Latex 16 Fr. 04/07/20  0512  Latex  1099   Incision (Closed) 03/02/20 Knee Left 03/02/20  2001  -- 1135  Intake/Output Last 24 hours No intake or output data in the 24 hours ending 04/11/23 1628  Labs/Imaging Results for orders placed or performed during the hospital encounter of 04/11/23 (from the past 48 hour(s))  Comprehensive metabolic panel     Status: Abnormal   Collection Time: 04/11/23  1:43 PM  Result Value Ref Range   Sodium 131 (L) 135 - 145 mmol/L   Potassium 3.6 3.5 - 5.1 mmol/L   Chloride 98 98 - 111 mmol/L   CO2 26 22 - 32 mmol/L   Glucose, Bld 114 (H)  70 - 99 mg/dL    Comment: Glucose reference range applies only to samples taken after fasting for at least 8 hours.   BUN 13 8 - 23 mg/dL   Creatinine, Ser 4.54 0.44 - 1.00 mg/dL   Calcium 9.1 8.9 - 09.8 mg/dL   Total Protein 7.5 6.5 - 8.1 g/dL   Albumin 3.7 3.5 - 5.0 g/dL   AST 26 15 - 41 U/L   ALT 23 0 - 44 U/L   Alkaline Phosphatase 70 38 - 126 U/L   Total Bilirubin 0.9 0.3 - 1.2 mg/dL   GFR, Estimated >11 >91 mL/min    Comment: (NOTE) Calculated using the CKD-EPI Creatinine Equation (2021)    Anion gap 7 5 - 15    Comment: Performed at [facility], [address], [place], [place] [address]  CBC with Differential     Status: Abnormal   Collection Time: 04/11/23  1:43 PM  Result Value Ref Range   WBC 11.2 (H) 4.0 - 10.5 K/uL   RBC 3.39 (L) 3.87 - 5.11 MIL/uL   Hemoglobin 9.6 (L) 12.0 - 15.0 g/dL   HCT 56.2 (L) 13.0 - 86.5 %   MCV 90.9 80.0 - 100.0 fL   MCH 28.3 26.0 - 34.0 pg   MCHC 31.2 30.0 - 36.0 g/dL   RDW 78.4 69.6 - 29.5 %   Platelets 161 150 - 400 K/uL   nRBC 0.0 0.0 - 0.2 %   Neutrophils Relative % 79 %   Neutro Abs 8.8 (H) 1.7 - 7.7 K/uL   Lymphocytes Relative 11 %   Lymphs Abs 1.2 0.7 - 4.0 K/uL   Monocytes Relative 10 %   Monocytes Absolute 1.1 (H) 0.1 - 1.0 K/uL   Eosinophils Relative 0 %   Eosinophils Absolute 0.0 0.0 - 0.5 K/uL   Basophils Relative 0 %   Basophils Absolute 0.0 0.0 - 0.1 K/uL   Immature Granulocytes 0 %   Abs Immature Granulocytes 0.04 0.00 - 0.07 K/uL    Comment: Performed at [facility], [address], [place], [place] [address]  Type and screen [facility]     Status: None   Collection Time: 04/11/23  1:43 PM  Result Value Ref Range   ABO/RH(D) A POS    Antibody Screen NEG    Sample Expiration      04/14/2023,2359 Performed at [facility], [address], [place], [place] [address]   POC occult blood, ED     Status: Abnormal   Collection Time:  04/11/23  1:47 PM  Result Value Ref Range   Fecal Occult Bld POSITIVE (A) NEGATIVE  Urinalysis, Routine w reflex microscopic -Urine, Clean Catch     Status: Abnormal   Collection Time: 04/11/23  4:00 PM  Result Value Ref Range   Color, Urine YELLOW YELLOW   APPearance CLEAR CLEAR   Specific Gravity, Urine >  1.046 (H) 1.005 - 1.030   pH 7.0 5.0 - 8.0   Glucose, UA NEGATIVE NEGATIVE mg/dL   Hgb urine dipstick SMALL (A) NEGATIVE   Bilirubin Urine NEGATIVE NEGATIVE   Ketones, ur NEGATIVE NEGATIVE mg/dL   Protein, ur NEGATIVE NEGATIVE mg/dL   Nitrite NEGATIVE NEGATIVE   Leukocytes,Ua NEGATIVE NEGATIVE   RBC / HPF 0-5 0 - 5 RBC/hpf   WBC, UA 0-5 0 - 5 WBC/hpf   Bacteria, UA NONE SEEN NONE SEEN   Squamous Epithelial / HPF 0-5 0 - 5 /HPF    Comment: Performed at [facility], [address], [place], [place] [address]   CT ABDOMEN PELVIS W CONTRAST  Result Date: 04/11/2023 CLINICAL DATA:  Abdominal pain with blood in stools. EXAM: CT ABDOMEN AND PELVIS WITH CONTRAST TECHNIQUE: Multidetector CT imaging of the abdomen and pelvis was performed using the standard protocol following bolus administration of intravenous contrast. RADIATION DOSE REDUCTION: This exam was performed according to the departmental dose-optimization program which includes automated exposure control, adjustment of the mA and/or kV according to patient size and/or use of iterative reconstruction technique. CONTRAST:  OMNIPAQUE IOHEXOL 300 MG/ML  SOLN COMPARISON:  11/06/2018 FINDINGS: Lower chest: The heart is enlarged. Tiny right pleural effusion with dependent atelectasis in both lung bases. Hepatobiliary: No suspicious focal abnormality within the liver parenchyma. Cholecystectomy. Common bile duct measures 9 mm diameter just proximal to the ampulla, upper normal for patient age in the setting of cholecystectomy. Pancreas: No focal mass lesion. No dilatation of the main duct. No intraparenchymal cyst. No  peripancreatic edema. Spleen: No splenomegaly. No suspicious focal mass lesion. Adrenals/Urinary Tract: No adrenal nodule or mass. Kidneys unremarkable. Cortical scarring noted bilaterally. No evidence for hydroureter. The urinary bladder appears normal for the degree of distention. Stomach/Bowel: Stomach is unremarkable. No gastric wall thickening. No evidence of outlet obstruction. Duodenum is normally positioned as is the ligament of Treitz. No small bowel wall thickening. No small bowel dilatation. Diverticular are seen scattered along the length of the terminal ileum, otherwise unremarkable. Nonvisualization of the appendix is consistent with the reported history of appendectomy. Marked edema and circumferential wall thickening noted cecum and ascending colon extending into the hepatic flexure. There is pericolonic edema/inflammation transverse colon splenic flexure, and left colon are unremarkable aside from some diverticulosis. Vascular/Lymphatic: There is moderate atherosclerotic calcification of the abdominal aorta without aneurysm. Portal vein and superior mesenteric vein are patent. Celiac axis and SMA opacified normally. IMA is opacified. No abdominal lymphadenopathy. No pelvic lymphadenopathy. Reproductive: The uterus is unremarkable.  There is no adnexal mass. Other: No intraperitoneal free fluid. Musculoskeletal: No worrisome lytic or sclerotic osseous abnormality. Mild compression deformity noted at T11 and L1. IMPRESSION: 1. Marked edema and circumferential wall thickening involving the cecum and ascending colon extending into the hepatic flexure. Imaging features are compatible with colitis, likely infectious/inflammatory. Despite the diffuse colonic diverticulosis, imaging features are not highly suggestive of diverticulitis. 2. Tiny right pleural effusion with dependent atelectasis in both lung bases. 3.  Aortic Atherosclerosis (ICD10-I70.0). Electronically Signed   By: Kennith Center M.D.   On:  04/11/2023 15:29    Pending Labs [employer] (From admission, onward)     Start     Ordered   Signed and Armed forces training and education officer morning,   R        Signed and Held   Signed and Held  CBC  Tomorrow morning,   R  Signed and Held            Vitals/Pain Today's Vitals   04/11/23 1313 04/11/23 1317 04/11/23 1318 04/11/23 1600  BP: (!) 127/56     Pulse: 86     Resp: 20     Temp: 99.1 F (37.3 C)     TempSrc: Oral     SpO2: 99%     Weight:   64.4 kg   Height:   5' 2.5" (1.588 m)   PainSc:  10-Worst pain ever  0-No pain    Isolation Precautions No active isolations  Medications Medications  pantoprazole (PROTONIX) injection 40 mg (40 mg Intravenous Given 04/11/23 1404)  iohexol (OMNIPAQUE) 300 MG/ML solution 100 mL (100 mLs Intravenous Contrast Given 04/11/23 1449)    Mobility walks with device     Focused Assessments Neuro Assessment Handoff:  Swallow screen pass?  N?A         Neuro Assessment: Within Defined Limits Neuro Checks:      Has TPA been given? No If patient is a Neuro Trauma and patient is going to OR before floor call report to 4N Charge nurse: [phone] or [phone]   R Recommendations: See Admitting Provider Note  Report given to:   Additional Notes:

## 2023-04-12 ENCOUNTER — Inpatient Hospital Stay (HOSPITAL_COMMUNITY): Payer: Medicare Other | Admitting: Anesthesiology

## 2023-04-12 ENCOUNTER — Encounter (HOSPITAL_COMMUNITY): Payer: Self-pay | Admitting: Internal Medicine

## 2023-04-12 ENCOUNTER — Encounter (HOSPITAL_COMMUNITY)
Admission: EM | Disposition: A | Discharge status: Home / Self Care | Payer: Self-pay | Source: Home / Self Care | Attending: Internal Medicine

## 2023-04-12 DIAGNOSIS — I509 Heart failure, unspecified: Secondary | ICD-10-CM

## 2023-04-12 DIAGNOSIS — D62 Acute posthemorrhagic anemia: Secondary | ICD-10-CM

## 2023-04-12 DIAGNOSIS — I110 Hypertensive heart disease with heart failure: Secondary | ICD-10-CM

## 2023-04-12 DIAGNOSIS — K921 Melena: Secondary | ICD-10-CM

## 2023-04-12 DIAGNOSIS — Z87891 Personal history of nicotine dependence: Secondary | ICD-10-CM

## 2023-04-12 DIAGNOSIS — D509 Iron deficiency anemia, unspecified: Secondary | ICD-10-CM

## 2023-04-12 DIAGNOSIS — Z7901 Long term (current) use of anticoagulants: Secondary | ICD-10-CM | POA: Diagnosis not present

## 2023-04-12 DIAGNOSIS — R933 Abnormal findings on diagnostic imaging of other parts of digestive tract: Secondary | ICD-10-CM

## 2023-04-12 DIAGNOSIS — R1084 Generalized abdominal pain: Secondary | ICD-10-CM

## 2023-04-12 HISTORY — PX: ESOPHAGOGASTRODUODENOSCOPY: SHX5428

## 2023-04-12 LAB — BASIC METABOLIC PANEL
Anion gap: 6 (ref 5–15)
BUN: 10 mg/dL (ref 8–23)
CO2: 26 mmol/L (ref 22–32)
Calcium: 8.7 mg/dL — ABNORMAL LOW (ref 8.9–10.3)
Chloride: 101 mmol/L (ref 98–111)
Creatinine, Ser: 0.71 mg/dL (ref 0.44–1.00)
GFR, Estimated: >60 mL/min (ref >60)
Glucose, Bld: 103 mg/dL — ABNORMAL HIGH (ref 70–99)
Potassium: 3.4 mmol/L — ABNORMAL LOW (ref 3.5–5.1)
Sodium: 133 mmol/L — ABNORMAL LOW (ref 135–145)

## 2023-04-12 LAB — CBC
HCT: 27.6 % — ABNORMAL LOW (ref 36.0–46.0)
HCT: 27 % — ABNORMAL LOW (ref 36.0–46.0)
Hemoglobin: 8.5 g/dL — ABNORMAL LOW (ref 12.0–15.0)
Hemoglobin: 8.2 g/dL — ABNORMAL LOW (ref 12.0–15.0)
MCH: 28.1 pg (ref 26.0–34.0)
MCH: 28 pg (ref 26.0–34.0)
MCHC: 30.8 g/dL (ref 30.0–36.0)
MCHC: 30.4 g/dL (ref 30.0–36.0)
MCV: 91.4 fL (ref 80.0–100.0)
MCV: 92.2 fL (ref 80.0–100.0)
Platelets: 135 10*3/uL — ABNORMAL LOW (ref 150–400)
Platelets: 138 10*3/uL — ABNORMAL LOW (ref 150–400)
RBC: 3.02 MIL/uL — ABNORMAL LOW (ref 3.87–5.11)
RBC: 2.93 MIL/uL — ABNORMAL LOW (ref 3.87–5.11)
RDW: 15.5 % (ref 11.5–15.5)
RDW: 15.5 % (ref 11.5–15.5)
WBC: 9.4 10*3/uL (ref 4.0–10.5)
WBC: 10.9 10*3/uL — ABNORMAL HIGH (ref 4.0–10.5)
nRBC: 0 % (ref 0.0–0.2)
nRBC: 0 % (ref 0.0–0.2)

## 2023-04-12 LAB — FERRITIN: Ferritin: 21 ng/mL (ref 11–307)

## 2023-04-12 LAB — IRON AND TIBC
Iron: 21 ug/dL — ABNORMAL LOW (ref 28–170)
Saturation Ratios: 6 % — ABNORMAL LOW (ref 10.4–31.8)
TIBC: 364 ug/dL (ref 250–450)
UIBC: 343 ug/dL

## 2023-04-12 LAB — RETICULOCYTES
Immature Retic Fract: 22.5 % — ABNORMAL HIGH (ref 2.3–15.9)
RBC.: 2.91 MIL/uL — ABNORMAL LOW (ref 3.87–5.11)
Retic Count, Absolute: 71.3 10*3/uL (ref 19.0–186.0)
Retic Ct Pct: 2.5 % (ref 0.4–3.1)

## 2023-04-12 LAB — FOLATE: Folate: 12.9 ng/mL (ref >5.9)

## 2023-04-12 LAB — VITAMIN B12: Vitamin B-12: 578 pg/mL (ref 180–914)

## 2023-04-12 SURGERY — EGD (ESOPHAGOGASTRODUODENOSCOPY)
Anesthesia: Monitor Anesthesia Care

## 2023-04-12 MED ORDER — POTASSIUM CHLORIDE IN NACL 40-0.9 MEQ/L-% IV SOLN
INTRAVENOUS | Status: DC
  Filled 2023-04-12: qty 1000

## 2023-04-12 MED ORDER — CIPROFLOXACIN IN D5W 400 MG/200ML IV SOLN
400.0000 mg | Freq: Two times a day (BID) | INTRAVENOUS | Status: DC
  Administered 2023-04-12: 400 mg via INTRAVENOUS
  Filled 2023-04-12: qty 200

## 2023-04-12 MED ORDER — LACTATED RINGERS IV SOLN: INTRAVENOUS | Status: DC

## 2023-04-12 MED ORDER — ORAL CARE MOUTH RINSE: 15.0000 mL | OROMUCOSAL | Status: DC | PRN

## 2023-04-12 MED ORDER — PROPOFOL 10 MG/ML IV BOLUS
INTRAVENOUS | Status: AC
  Filled 2023-04-12: qty 20

## 2023-04-12 MED ORDER — PROPOFOL 500 MG/50ML IV EMUL
INTRAVENOUS | Status: AC
  Filled 2023-04-12: qty 50

## 2023-04-12 MED ORDER — PROPOFOL 10 MG/ML IV BOLUS
INTRAVENOUS | Status: DC | PRN
  Administered 2023-04-12: 70 mg via INTRAVENOUS

## 2023-04-12 MED ORDER — METRONIDAZOLE 500 MG/100ML IV SOLN
500.0000 mg | Freq: Two times a day (BID) | INTRAVENOUS | Status: DC
  Administered 2023-04-12: 500 mg via INTRAVENOUS
  Filled 2023-04-12: qty 100

## 2023-04-12 MED ORDER — LIDOCAINE HCL (CARDIAC) PF 100 MG/5ML IV SOSY
PREFILLED_SYRINGE | INTRAVENOUS | Status: DC | PRN
  Administered 2023-04-12: 60 mg via INTRAVENOUS

## 2023-04-12 NOTE — Anesthesia Postprocedure Evaluation (Signed)
Anesthesia Post Note  Patient: Grace Alvarado  Procedure(s) Performed: ESOPHAGOGASTRODUODENOSCOPY (EGD)     Patient location during evaluation: PACU Anesthesia Type: MAC Level of consciousness: awake and alert Pain management: pain level controlled Vital Signs Assessment: post-procedure vital signs reviewed and stable Respiratory status: spontaneous breathing, nonlabored ventilation, respiratory function stable and patient connected to nasal cannula oxygen Cardiovascular status: stable and blood pressure returned to baseline Postop Assessment: no apparent nausea or vomiting Anesthetic complications: no  No notable events documented.  Last Vitals:  Vitals:   04/12/23 1050 04/12/23 1055  BP: (!) 128/47   Pulse: 96 90  Resp: (!) 29 (!) 27  Temp:    SpO2: 96% 94%    Last Pain:  Vitals:   04/12/23 1055  TempSrc:   PainSc: 0-No pain                 Mieshia Pepitone S

## 2023-04-12 NOTE — Consult Note (Addendum)
[facility] Gastroenterology Consult Note   History Grace Alvarado MRN # [id number]  Date of Admission: 04/11/2023 Date of Consultation: 04/12/2023 Referring physician: Dr. Calvert Cantor, MD Primary Care Provider: Pincus Sanes, MD Primary Gastroenterologist: Dr. Amada Jupiter (from February 2020 hospitalization, no office encounters)   Reason for Consultation/Chief Complaint: Melena and acute blood loss anemia  Subjective  HPI:  This is a 89 year old woman admitted through the ED last evening after presenting from home with multiple episodes of passing melena and discovery of significant anemia. She is also discovered to have apparent right colon wall thickening with adjacent inflammation on a CTAP done with IV but no oral contrast. Initial hemoglobin 9.5 down from baseline of 12, no transfusion thus far, and hemoglobin down to 8.5 this morning. She is on Xarelto for atrial fibrillation, last dose the evening before last.  Grace Alvarado is accompanied by 2 daughters at the bedside, and they assist with the history since she cannot recall all the events.  She lives alone but her daughter check on her every day.  2 evenings ago she developed severe mid to lower crampy abdominal pain and passed bloody stool.  1 daughter had a photograph and showed me what appears to be maroon stool.  She had further episodes of that bleeding and ongoing pain leading to the ED visit yesterday.  She does not appear to have had any further episodes of bleeding overnight according to the patient and nursing reports.  Remains hemodynamically stable, no fever. No recent travel, no sick contacts, no antibiotic use.  At this time, she still has some periumbilic to bandlike lower abdominal pain, less severe than when it first started.  Denies chest pain dyspnea or recent weight loss.  Grace Alvarado had a similar presentation in February 2020, at which time I performed an upper endoscopy without a source found.  Colonoscopy the following day  discovered a medium to large size ascending colon AVM with stigmata of bleeding, treated with submucosal epinephrine followed by argon plasma coagulation.  ROS:  Remainder systems negative except as above  All other systems are negative except as noted above in the HPI  Past Medical History Past Medical History:  Diagnosis Date   Atrial fibrillation (HCC)    Hyperlipidemia    Hypertension    Hypothyroidism    Mitral regurgitation    MVP (mitral valve prolapse)     Past Surgical History Past Surgical History:  Procedure Laterality Date   APPENDECTOMY     c section     CESAREAN SECTION     CHOLECYSTECTOMY     COLONOSCOPY WITH PROPOFOL N/A 11/09/2018   Procedure: COLONOSCOPY WITH PROPOFOL;  Surgeon: Sherrilyn Rist, MD;  Location: [facility] ENDOSCOPY;  Service: Gastroenterology;  Laterality: N/A;   ESOPHAGOGASTRODUODENOSCOPY (EGD) WITH PROPOFOL N/A 11/09/2018   Procedure: ESOPHAGOGASTRODUODENOSCOPY (EGD) WITH PROPOFOL;  Surgeon: Sherrilyn Rist, MD;  Location: [facility] ENDOSCOPY;  Service: Gastroenterology;  Laterality: N/A;   HOT HEMOSTASIS N/A 11/09/2018   Procedure: HOT HEMOSTASIS (ARGON PLASMA COAGULATION/BICAP);  Surgeon: Sherrilyn Rist, MD;  Location: [facility] ENDOSCOPY;  Service: Gastroenterology;  Laterality: N/A;   I & D EXTREMITY Left 03/02/2020   Procedure: evacuation of left knee hematoma, possible wound vac placement;  Surgeon: Cammy Copa, MD;  Location: [facility] OR;  Service: Orthopedics;  Laterality: Left;   SUBMUCOSAL INJECTION  11/09/2018   Procedure: SUBMUCOSAL INJECTION;  Surgeon: Sherrilyn Rist, MD;  Location: [facility] ENDOSCOPY;  Service: Gastroenterology;;    [facility]  History Family History  Problem Relation Age of Onset   Cancer Mother    Dementia Mother     Social History Social History   Socioeconomic History   Marital status: Widowed    Spouse name: Not on file   Number of children: 3   Years of education: Not on file   Highest education level: Not on file   Occupational History   Occupation: retired  Tobacco Use   Smoking status: Former   Smokeless tobacco: Never  Building services engineer Use: Never used  Substance and Sexual Activity   Alcohol use: Not Currently   Drug use: Never   Sexual activity: Not Currently  Other Topics Concern   Not on file  Social History Narrative   Not on file   Social Determinants of Health   Financial Resource Strain: Not on file  Food Insecurity: No Food Insecurity (04/11/2023)   Hunger Vital Sign    Worried About Running Out of Food in the Last Year: Never true    Ran Out of Food in the Last Year: Never true  Transportation Needs: No Transportation Needs (04/11/2023)   PRAPARE - Administrator, Civil Service (Medical): No    Lack of Transportation (Non-Medical): No  Physical Activity: Not on file  Stress: Not on file  Social Connections: Not on file    Allergies Allergies  Allergen Reactions   Codeine Other (See Comments)    unknown   Penicillins Other (See Comments)    unknown   Vancomycin Other (See Comments)    unknown   Zithromax [Azithromycin] Other (See Comments)    unknown   Latex Rash    Outpatient Meds Home medications from the H+P and/or nursing med reconciliation reviewed.  Inpatient med list reviewed  _____________________________________________________________________ Objective   Exam:  Current vital signs  Patient Vitals for the past 8 hrs:  BP Temp Temp src Pulse SpO2  04/12/23 0438 118/71 98.9 F (37.2 C) Oral 82 97 %    Intake/Output Summary (Last 24 hours) at 04/12/2023 1610 Last data filed at 04/12/2023 0403 Gross per 24 hour  Intake 427.58 ml  Output 250 ml  Net 177.58 ml    Physical Exam:   General: this is a delightful elderly female patient in no acute distress -laying comfortably in bed, nontoxic-appearing.  2 daughters at the bedside Eyes: sclera anicteric, no redness ENT: oral mucosa moist without lesions, no cervical or  supraclavicular lymphadenopathy, good dentition CV: RRR without murmur, S1/S2, no JVD,, no peripheral edema Resp: clear to auscultation bilaterally, normal RR and effort noted GI: soft, diffuse tenderness, with active bowel sounds. No distention or guarding or palpable organomegaly noted.  No bruit, normal bowel sound character Skin; warm and dry, no rash or jaundice noted Neuro: awake, alert and oriented x 3. Normal gross motor function and fluent speech.  Labs:     Latest Ref Rng & Units 04/12/2023    3:47 AM 04/11/2023    1:43 PM 10/22/2022   11:38 AM  CBC  WBC 4.0 - 10.5 K/uL 9.4  11.2  6.4   Hemoglobin 12.0 - 15.0 g/dL 8.5  9.6  96.0   Hematocrit 36.0 - 46.0 % 27.6  30.8  36.0   Platelets 150 - 400 K/uL 135  161  170.0        Latest Ref Rng & Units 04/12/2023    3:47 AM 04/11/2023    1:43 PM 03/09/2023    2:16 PM  CMP  Glucose 70 - 99 mg/dL 161  096  045   BUN 8 - 23 mg/dL 10  13  11    Creatinine 0.44 - 1.00 mg/dL 4.09  8.11  9.14   Sodium 135 - 145 mmol/L 133  131  137   Potassium 3.5 - 5.1 mmol/L 3.4  3.6  4.3   Chloride 98 - 111 mmol/L 101  98  101   CO2 22 - 32 mmol/L 26  26  30    Calcium 8.9 - 10.3 mg/dL 8.7  9.1  9.5   Total Protein 6.5 - 8.1 g/dL  7.5    Total Bilirubin 0.3 - 1.2 mg/dL  0.9    Alkaline Phos 38 - 126 U/L  70    AST 15 - 41 U/L  26    ALT 0 - 44 U/L  23      No results for input(s): "INR" in the last 168 hours. _________________________________________________________ Radiologic studies:  CLINICAL DATA:  Abdominal pain with blood in stools.   EXAM: CT ABDOMEN AND PELVIS WITH CONTRAST   TECHNIQUE: Multidetector CT imaging of the abdomen and pelvis was performed using the standard protocol following bolus administration of intravenous contrast.   RADIATION DOSE REDUCTION: This exam was performed according to the departmental dose-optimization program which includes automated exposure control, adjustment of the mA and/or kV according to patient  size and/or use of iterative reconstruction technique.   CONTRAST:  OMNIPAQUE IOHEXOL 300 MG/ML  SOLN   COMPARISON:  11/06/2018   FINDINGS: Lower chest: The heart is enlarged. Tiny right pleural effusion with dependent atelectasis in both lung bases.   Hepatobiliary: No suspicious focal abnormality within the liver parenchyma. Cholecystectomy. Common bile duct measures 9 mm diameter just proximal to the ampulla, upper normal for patient age in the setting of cholecystectomy.   Pancreas: No focal mass lesion. No dilatation of the main duct. No intraparenchymal cyst. No peripancreatic edema.   Spleen: No splenomegaly. No suspicious focal mass lesion.   Adrenals/Urinary Tract: No adrenal nodule or mass. Kidneys unremarkable. Cortical scarring noted bilaterally. No evidence for hydroureter. The urinary bladder appears normal for the degree of distention.   Stomach/Bowel: Stomach is unremarkable. No gastric wall thickening. No evidence of outlet obstruction. Duodenum is normally positioned as is the ligament of Treitz. No small bowel wall thickening. No small bowel dilatation. Diverticular are seen scattered along the length of the terminal ileum, otherwise unremarkable. Nonvisualization of the appendix is consistent with the reported history of appendectomy. Marked edema and circumferential wall thickening noted cecum and ascending colon extending into the hepatic flexure. There is pericolonic edema/inflammation transverse colon splenic flexure, and left colon are unremarkable aside from some diverticulosis.   Vascular/Lymphatic: There is moderate atherosclerotic calcification of the abdominal aorta without aneurysm. Portal vein and superior mesenteric vein are patent. Celiac axis and SMA opacified normally. IMA is opacified. No abdominal lymphadenopathy. No pelvic lymphadenopathy.   Reproductive: The uterus is unremarkable.  There is no adnexal mass.   Other: No  intraperitoneal free fluid.   Musculoskeletal: No worrisome lytic or sclerotic osseous abnormality. Mild compression deformity noted at T11 and L1.   IMPRESSION: 1. Marked edema and circumferential wall thickening involving the cecum and ascending colon extending into the hepatic flexure. Imaging features are compatible with colitis, likely infectious/inflammatory. Despite the diffuse colonic diverticulosis, imaging features are not highly suggestive of diverticulitis. 2. Tiny right pleural effusion with dependent atelectasis in both lung bases. 3.  Aortic Atherosclerosis (ICD10-I70.0).  Electronically Signed   By: Kennith Center M.D.   On: 04/11/2023 15:29    (Images personally reviewed - H. Danis) ______________________________________________________ Other studies:   _______________________________________________________ Assessment & Plan  Impression:  Hematochezia-maroon stool  Acute blood loss anemia, exacerbated by long-term use of oral anticoagulation for atrial fibrillation  CT abdomen and pelvis abnormal-highly suggestive of diffuse colitis that is primarily right-sided.  Generalized abdominal pain  History of right colon AVM causing GI bleed in 2020 requiring colonoscopic hemostasis.   Overall clinical picture highly suggestive of infectious colitis with lower GI bleeding leading to acute blood loss anemia.  Despite her age and atherosclerotic disease on imaging, the location of colitis is less likely to be ischemic.  Although there is no oral contrast, these findings do appear convincing on imaging. Less likely upper digestive source since not on aspirin, NSAIDs or other potential cause of ulcer, and with normal BUN.  However, upper digestive source should be ruled out given the volume of bleeding she has had.  Plan:  Hold oral anticoagulation until further notice.  N.p.o.  Upper endoscopy this morning.  This was described in detail along with risks and  benefits in the presence of her daughters and she was agreeable.  The benefits and risks of the planned procedure were described in detail with the patient or (when appropriate) their health care proxy.  Risks were outlined as including, but not limited to, bleeding, infection, perforation, adverse medication reaction leading to cardiac or pulmonary decompensation, pancreatitis (if ERCP).  The limitation of incomplete mucosal visualization was also discussed.  No guarantees or warranties were given. Patient at increased risk for cardiopulmonary complications of procedure due to medical comorbidities.  Afterward, stool studies for full GI pathogen panel and C. difficile PCR.  If this is infectious colitis as I suspect, then empiric antibiotics are warranted given the severity of the illness, GI bleeding at her age.  Even if this is an unusual presentation of ischemic colitis, antibiotics are still warranted due to the risk of bacterial translocation and sepsis.  I hope she will not need a colonoscopy for diagnosis given the high risks of that and the patient her age with what may be severe colitis.  Fortunately, no fever or leukocytosis or ileus.  Thank you for the courtesy of this consult.  Please contact me with any questions or concerns.  Charlie Pitter III Office: [phone]

## 2023-04-12 NOTE — Transfer of Care (Addendum)
Immediate Anesthesia Transfer of Care Note  Patient: Grace Alvarado  Procedure(s) Performed: ESOPHAGOGASTRODUODENOSCOPY (EGD)  Patient Location: PACU  Anesthesia Type:MAC  Level of Consciousness: drowsy and patient cooperative  Airway & Oxygen Therapy: Patient Spontanous Breathing and Patient connected to face mask oxygen  Post-op Assessment: Report given to RN and Post -op Vital signs reviewed and stable  Post vital signs: Reviewed and stable  Last Vitals:  Vitals Value Taken Time  BP 108/43 04/12/23 1040  Temp  04/12/23   1040  Pulse 95 04/12/23 1041  Resp 28 04/12/23 1041  SpO2 100 % 04/12/23 1041  Vitals shown include unvalidated device data.  Last Pain:  Vitals:   04/12/23 0958  TempSrc: Temporal  PainSc: 6       Patients Stated Pain Goal: 0 (04/12/23 0958)  Complications: No notable events documented.

## 2023-04-12 NOTE — Interval H&P Note (Signed)
History and Physical Interval Note:  04/12/2023 10:07 AM  Grace Alvarado  has presented today for surgery, with the diagnosis of Anemia, Melena, anticoagulation needs.  The various methods of treatment have been discussed with the patient and family. After consideration of risks, benefits and other options for treatment, the patient has consented to  Procedure(s): ESOPHAGOGASTRODUODENOSCOPY (EGD) (N/A) as a surgical intervention.  The patient's history has been reviewed, patient examined, no change in status, stable for surgery.  I have reviewed the patient's chart and labs.  Questions were answered to the patient's satisfaction.     Charlie Pitter III

## 2023-04-12 NOTE — Plan of Care (Signed)

## 2023-04-12 NOTE — H&P (View-Only) (Signed)
[facility] Consult Note   History Grace Alvarado MRN # [id number]  Date of Admission: 04/11/2023 Date of Consultation: 04/12/2023 Referring physician: Dr. Rizwan, Saima, MD Primary Care Provider: Burns, Stacy J, MD Primary Gastroenterologist: Dr. Obie Silos Danis (from February 2020 hospitalization, no office encounters)   Reason for Consultation/Chief Complaint: Melena and acute blood loss anemia  Subjective  HPI:  This is a 89-year-old woman admitted through the ED last evening after presenting from home with multiple episodes of passing melena and discovery of significant anemia. She is also discovered to have apparent right colon wall thickening with adjacent inflammation on a CTAP done with IV but no oral contrast. Initial hemoglobin 9.5 down from baseline of 12, no transfusion thus far, and hemoglobin down to 8.5 this morning. She is on Xarelto for atrial fibrillation, last dose the evening before last.  Grace Alvarado is accompanied by 2 daughters at the bedside, and they assist with the history since she cannot recall all the events.  She lives alone but her daughter check on her every day.  2 evenings ago she developed severe mid to lower crampy abdominal pain and passed bloody stool.  1 daughter had a photograph and showed me what appears to be maroon stool.  She had further episodes of that bleeding and ongoing pain leading to the ED visit yesterday.  She does not appear to have had any further episodes of bleeding overnight according to the patient and nursing reports.  Remains hemodynamically stable, no fever. No recent travel, no sick contacts, no antibiotic use.  At this time, she still has some periumbilic to bandlike lower abdominal pain, less severe than when it first started.  Denies chest pain dyspnea or recent weight loss.  Grace Alvarado had a similar presentation in February 2020, at which time I performed an upper endoscopy without a source found.  Colonoscopy the following day  discovered a medium to large size ascending colon AVM with stigmata of bleeding, treated with submucosal epinephrine followed by argon plasma coagulation.  ROS:  Remainder systems negative except as above  All other systems are negative except as noted above in the HPI  Past Medical History Past Medical History:  Diagnosis Date   Atrial fibrillation (HCC)    Hyperlipidemia    Hypertension    Hypothyroidism    Mitral regurgitation    MVP (mitral valve prolapse)     Past Surgical History Past Surgical History:  Procedure Laterality Date   APPENDECTOMY     c section     CESAREAN SECTION     CHOLECYSTECTOMY     COLONOSCOPY WITH PROPOFOL N/A 11/09/2018   Procedure: COLONOSCOPY WITH PROPOFOL;  Surgeon: Danis, Jarnell Cordaro L III, MD;  Location: [facility];  Service: Gastroenterology;  Laterality: N/A;   ESOPHAGOGASTRODUODENOSCOPY (EGD) WITH PROPOFOL N/A 11/09/2018   Procedure: ESOPHAGOGASTRODUODENOSCOPY (EGD) WITH PROPOFOL;  Surgeon: Danis, Tenika Keeran L III, MD;  Location: [facility];  Service: Gastroenterology;  Laterality: N/A;   HOT HEMOSTASIS N/A 11/09/2018   Procedure: HOT HEMOSTASIS (ARGON PLASMA COAGULATION/BICAP);  Surgeon: Danis, Fatin Bachicha L III, MD;  Location: [facility];  Service: Gastroenterology;  Laterality: N/A;   I & D EXTREMITY Left 03/02/2020   Procedure: evacuation of left knee hematoma, possible wound vac placement;  Surgeon: Dean, Gregory Scott, MD;  Location: [facility] OR;  Service: Orthopedics;  Laterality: Left;   SUBMUCOSAL INJECTION  11/09/2018   Procedure: SUBMUCOSAL INJECTION;  Surgeon: Danis, Hollie Bartus L III, MD;  Location: [facility];  Service: Gastroenterology;;    Family   History Family History  Problem Relation Age of Onset   Cancer Mother    Dementia Mother     Social History Social History   Socioeconomic History   Marital status: Widowed    Spouse name: Not on file   Number of children: 3   Years of education: Not on file   Highest education level: Not on file   Occupational History   Occupation: retired  Tobacco Use   Smoking status: Former   Smokeless tobacco: Never  Vaping Use   Vaping Use: Never used  Substance and Sexual Activity   Alcohol use: Not Currently   Drug use: Never   Sexual activity: Not Currently  Other Topics Concern   Not on file  Social History Narrative   Not on file   Social Determinants of Health   Financial Resource Strain: Not on file  Food Insecurity: No Food Insecurity (04/11/2023)   Hunger Vital Sign    Worried About Running Out of Food in the Last Year: Never true    Ran Out of Food in the Last Year: Never true  Transportation Needs: No Transportation Needs (04/11/2023)   PRAPARE - Transportation    Lack of Transportation (Medical): No    Lack of Transportation (Non-Medical): No  Physical Activity: Not on file  Stress: Not on file  Social Connections: Not on file    Allergies Allergies  Allergen Reactions   Codeine Other (See Comments)    unknown   Penicillins Other (See Comments)    unknown   Vancomycin Other (See Comments)    unknown   Zithromax [Azithromycin] Other (See Comments)    unknown   Latex Rash    Outpatient Meds Home medications from the H+P and/or nursing med reconciliation reviewed.  Inpatient med list reviewed  _____________________________________________________________________ Objective   Exam:  Current vital signs  Patient Vitals for the past 8 hrs:  BP Temp Temp src Pulse SpO2  04/12/23 0438 118/71 98.9 F (37.2 C) Oral 82 97 %    Intake/Output Summary (Last 24 hours) at 04/12/2023 0807 Last data filed at 04/12/2023 0403 Gross per 24 hour  Intake 427.58 ml  Output 250 ml  Net 177.58 ml    Physical Exam:   General: this is a delightful elderly female patient in no acute distress -laying comfortably in bed, nontoxic-appearing.  2 daughters at the bedside Eyes: sclera anicteric, no redness ENT: oral mucosa moist without lesions, no cervical or  supraclavicular lymphadenopathy, good dentition CV: RRR without murmur, S1/S2, no JVD,, no peripheral edema Resp: clear to auscultation bilaterally, normal RR and effort noted GI: soft, diffuse tenderness, with active bowel sounds. No distention or guarding or palpable organomegaly noted.  No bruit, normal bowel sound character Skin; warm and dry, no rash or jaundice noted Neuro: awake, alert and oriented x 3. Normal gross motor function and fluent speech.  Labs:     Latest Ref Rng & Units 04/12/2023    3:47 AM 04/11/2023    1:43 PM 10/22/2022   11:38 AM  CBC  WBC 4.0 - 10.5 K/uL 9.4  11.2  6.4   Hemoglobin 12.0 - 15.0 g/dL 8.5  9.6  12.5   Hematocrit 36.0 - 46.0 % 27.6  30.8  36.0   Platelets 150 - 400 K/uL 135  161  170.0        Latest Ref Rng & Units 04/12/2023    3:47 AM 04/11/2023    1:43 PM 03/09/2023    2:16 PM    CMP  Glucose 70 - 99 mg/dL 103  114  101   BUN 8 - 23 mg/dL 10  13  11   Creatinine 0.44 - 1.00 mg/dL 0.71  0.78  0.75   Sodium 135 - 145 mmol/L 133  131  137   Potassium 3.5 - 5.1 mmol/L 3.4  3.6  4.3   Chloride 98 - 111 mmol/L 101  98  101   CO2 22 - 32 mmol/L 26  26  30   Calcium 8.9 - 10.3 mg/dL 8.7  9.1  9.5   Total Protein 6.5 - 8.1 g/dL  7.5    Total Bilirubin 0.3 - 1.2 mg/dL  0.9    Alkaline Phos 38 - 126 U/L  70    AST 15 - 41 U/L  26    ALT 0 - 44 U/L  23      No results for input(s): "INR" in the last 168 hours. _________________________________________________________ Radiologic studies:  CLINICAL DATA:  Abdominal pain with blood in stools.   EXAM: CT ABDOMEN AND PELVIS WITH CONTRAST   TECHNIQUE: Multidetector CT imaging of the abdomen and pelvis was performed using the standard protocol following bolus administration of intravenous contrast.   RADIATION DOSE REDUCTION: This exam was performed according to the departmental dose-optimization program which includes automated exposure control, adjustment of the mA and/or kV according to patient  size and/or use of iterative reconstruction technique.   CONTRAST:  100mL OMNIPAQUE IOHEXOL 300 MG/ML  SOLN   COMPARISON:  11/06/2018   FINDINGS: Lower chest: The heart is enlarged. Tiny right pleural effusion with dependent atelectasis in both lung bases.   Hepatobiliary: No suspicious focal abnormality within the liver parenchyma. Cholecystectomy. Common bile duct measures 9 mm diameter just proximal to the ampulla, upper normal for patient age in the setting of cholecystectomy.   Pancreas: No focal mass lesion. No dilatation of the main duct. No intraparenchymal cyst. No peripancreatic edema.   Spleen: No splenomegaly. No suspicious focal mass lesion.   Adrenals/Urinary Tract: No adrenal nodule or mass. Kidneys unremarkable. Cortical scarring noted bilaterally. No evidence for hydroureter. The urinary bladder appears normal for the degree of distention.   Stomach/Bowel: Stomach is unremarkable. No gastric wall thickening. No evidence of outlet obstruction. Duodenum is normally positioned as is the ligament of Treitz. No small bowel wall thickening. No small bowel dilatation. Diverticular are seen scattered along the length of the terminal ileum, otherwise unremarkable. Nonvisualization of the appendix is consistent with the reported history of appendectomy. Marked edema and circumferential wall thickening noted cecum and ascending colon extending into the hepatic flexure. There is pericolonic edema/inflammation transverse colon splenic flexure, and left colon are unremarkable aside from some diverticulosis.   Vascular/Lymphatic: There is moderate atherosclerotic calcification of the abdominal aorta without aneurysm. Portal vein and superior mesenteric vein are patent. Celiac axis and SMA opacified normally. IMA is opacified. No abdominal lymphadenopathy. No pelvic lymphadenopathy.   Reproductive: The uterus is unremarkable.  There is no adnexal mass.   Other: No  intraperitoneal free fluid.   Musculoskeletal: No worrisome lytic or sclerotic osseous abnormality. Mild compression deformity noted at T11 and L1.   IMPRESSION: 1. Marked edema and circumferential wall thickening involving the cecum and ascending colon extending into the hepatic flexure. Imaging features are compatible with colitis, likely infectious/inflammatory. Despite the diffuse colonic diverticulosis, imaging features are not highly suggestive of diverticulitis. 2. Tiny right pleural effusion with dependent atelectasis in both lung bases. 3.  Aortic Atherosclerosis (ICD10-I70.0).       Electronically Signed   By: Eric  Mansell M.D.   On: 04/11/2023 15:29    (Images personally reviewed - H. Danis) ______________________________________________________ Other studies:   _______________________________________________________ Assessment & Plan  Impression:  Hematochezia-maroon stool  Acute blood loss anemia, exacerbated by long-term use of oral anticoagulation for atrial fibrillation  CT abdomen and pelvis abnormal-highly suggestive of diffuse colitis that is primarily right-sided.  Generalized abdominal pain  History of right colon AVM causing GI bleed in 2020 requiring colonoscopic hemostasis.   Overall clinical picture highly suggestive of infectious colitis with lower GI bleeding leading to acute blood loss anemia.  Despite her age and atherosclerotic disease on imaging, the location of colitis is less likely to be ischemic.  Although there is no oral contrast, these findings do appear convincing on imaging. Less likely upper digestive source since not on aspirin, NSAIDs or other potential cause of ulcer, and with normal BUN.  However, upper digestive source should be ruled out given the volume of bleeding she has had.  Plan:  Hold oral anticoagulation until further notice.  N.p.o.  Upper endoscopy this morning.  This was described in detail along with risks and  benefits in the presence of her daughters and she was agreeable.  The benefits and risks of the planned procedure were described in detail with the patient or (when appropriate) their health care proxy.  Risks were outlined as including, but not limited to, bleeding, infection, perforation, adverse medication reaction leading to cardiac or pulmonary decompensation, pancreatitis (if ERCP).  The limitation of incomplete mucosal visualization was also discussed.  No guarantees or warranties were given. Patient at increased risk for cardiopulmonary complications of procedure due to medical comorbidities.  Afterward, stool studies for full GI pathogen panel and C. difficile PCR.  If this is infectious colitis as I suspect, then empiric antibiotics are warranted given the severity of the illness, GI bleeding at her age.  Even if this is an unusual presentation of ischemic colitis, antibiotics are still warranted due to the risk of bacterial translocation and sepsis.  I hope she will not need a colonoscopy for diagnosis given the high risks of that and the patient her age with what may be severe colitis.  Fortunately, no fever or leukocytosis or ileus.  Thank you for the courtesy of this consult.  Please contact me with any questions or concerns.  Cloyce Paterson L Danis III Office: [phone]  

## 2023-04-12 NOTE — Anesthesia Preprocedure Evaluation (Signed)
Anesthesia Evaluation  Patient identified by MRN, date of birth, ID band Patient awake    Reviewed: Allergy & Precautions, H&P , NPO status , Patient's Chart, lab work & pertinent test results  Airway Mallampati: II  TM Distance: >3 FB Neck ROM: Full    Dental no notable dental hx.    Pulmonary neg pulmonary ROS, former smoker   Pulmonary exam normal breath sounds clear to auscultation       Cardiovascular hypertension, + dysrhythmias Atrial Fibrillation  Rhythm:Irregular Rate:Normal + Systolic murmurs 1. Left ventricular ejection fraction, by estimation, is 60 to 65%. Left  ventricular ejection fraction by PLAX is 61 %. The left ventricle has  normal function. The left ventricle has no regional wall motion  abnormalities. There is mild concentric left  ventricular hypertrophy. Left ventricular diastolic function could not be  evaluated. There is the interventricular septum is flattened in diastole  ('D' shaped left ventricle), consistent with right ventricular volume  overload.   2. Right ventricular systolic function is normal. The right ventricular  size is normal. There is mildly elevated pulmonary artery systolic  pressure.   3. Left atrial size was severely dilated.   4. Right atrial size was severely dilated.   5. The mitral valve is normal in structure. Moderate to severe mitral  valve regurgitation. No evidence of mitral stenosis. There is mild  prolapse of posterior of the mitral valve. Moderate mitral annular  calcification.   6. Tricuspid valve regurgitation is moderate.   7. The aortic valve is tricuspid. There is mild calcification of the  aortic valve. There is mild thickening of the aortic valve. Aortic valve  regurgitation is not visualized. No aortic stenosis is present.   8. The inferior vena cava is dilated in size with >50% respiratory  variability, suggesting right atrial pressure of 8 mmHg.   9. Cannot  exclude a small PFO.     Neuro/Psych negative neurological ROS  negative psych ROS   GI/Hepatic negative GI ROS, Neg liver ROS,,,  Endo/Other  Hypothyroidism    Renal/GU negative Renal ROS  negative genitourinary   Musculoskeletal negative musculoskeletal ROS (+)    Abdominal   Peds negative pediatric ROS (+)  Hematology  (+) Blood dyscrasia, anemia   Anesthesia Other Findings   Reproductive/Obstetrics negative OB ROS                             Anesthesia Physical Anesthesia Plan  ASA: 3 and emergent  Anesthesia Plan: MAC   Post-op Pain Management:    Induction: Intravenous  PONV Risk Score and Plan: Treatment may vary due to age or medical condition and Propofol infusion  Airway Management Planned: Nasal Cannula  Additional Equipment:   Intra-op Plan:   Post-operative Plan:   Informed Consent: I have reviewed the patients History and Physical, chart, labs and discussed the procedure including the risks, benefits and alternatives for the proposed anesthesia with the patient or authorized representative who has indicated his/her understanding and acceptance.     Dental advisory given  Plan Discussed with: CRNA and Surgeon  Anesthesia Plan Comments:        Anesthesia Quick Evaluation

## 2023-04-12 NOTE — Progress Notes (Addendum)
[facility] Progress Note  Patient: Grace Alvarado     ZOX:[id number]  DOA: 04/11/2023   PCP: Pincus Sanes, MD       Brief hospital course: This is a 89 year old female with atrial fibrillation, hypertension, hypothyroidism who presents to the hospital for dark stools.  The patient lives alone and her daughters live about 10 minutes away.  The patient seems to have a great amount of difficulty detailing her history but her daughters are at bedside and essentially give the bulk of the history.  Apparently the patient had black bowel movements at home associated with nausea and therefore presented to the ED. In the ED FOBT was noted to be positive. Hemoglobin was 12.5 and 10/22/2022 and 9.6 when she presented to the ED. A GI consult was requested.   Subjective:  The patient is unable to tell me if she is having any further bleeding   Assessment and Plan: Principal Problem:   Melena-acute anemia -Continue to follow for further episodes -I have spoken with both of the patient's daughters and have asked him to check at home to be sure that she has not been taking any Pepto-Bismol (the patient lives alone) -Apparently the patient went to the bathroom just before I came into the room however, the patient could not remember if this was for a bowel movement or just to urinate - Hemoglobin has dropped to 8.5 today - EGD performed today reveals a small hiatal hernia, no blood, normal esophagus stomach and duodenum. - Continue to hold Eliquis and continue to follow hemoglobin - Anemia panel has been ordered -Patient and daughters are in agreement with the blood transfusion if her hemoglobin drops below 7 - Regular diet has been resumed  Active Problems:  Acute thrombocytopenia - Platelets dropped from 1 61-1 35-continue to follow  Atrial fibrillation - Holding Xarelto for today - Continue diltiazem  Persistence of bilateral pedal edema -This is a chronic issue - Per daughters,  the patient's Lasix has been increased from every other day to daily to twice daily but she has persistent pedal edema - I have had an extensive conversation with patient and daughters regarding avoidance of excess Lasix-I have recommended elevation and TED hose - Will DC Lasix     Code Status: DNR Consultants: GI Level of Care: Level of care: Progressive Total time on patient care: 35 minutes DVT prophylaxis:  SCDs Start: 04/11/23 1753     Objective:   Vitals:   04/12/23 1050 04/12/23 1055 04/12/23 1117 04/12/23 1156  BP: (!) 128/47   126/72  Pulse: 96 90  98  Resp: (!) 29 (!) 27 20 16   Temp:    97.7 F (36.5 C)  TempSrc:    Oral  SpO2: 96% 94%  99%  Weight:      Height:       Filed Weights   04/11/23 1318 04/12/23 0958  Weight: 64.4 kg 64.4 kg   Exam: General exam: Appears comfortable  HEENT: oral mucosa moist Respiratory system: Clear to auscultation.  Cardiovascular system: S1 & S2 heard  Gastrointestinal system: Abdomen soft, non-tender, nondistended. Normal bowel sounds   Extremities: No cyanosis, clubbing - mild bilateral pedal edema Psychiatry:  Mood & affect appropriate.      CBC: Recent Labs  Lab 04/11/23 1343 04/12/23 0347  WBC 11.2* 9.4  NEUTROABS 8.8*  --   HGB 9.6* 8.5*  HCT 30.8* 27.6*  MCV 90.9 91.4  PLT 161 135*   Basic Metabolic Panel: Recent  Labs  Lab 04/11/23 1343 04/12/23 0347  NA 131* 133*  K 3.6 3.4*  CL 98 101  CO2 26 26  GLUCOSE 114* 103*  BUN 13 10  CREATININE 0.78 0.71  CALCIUM 9.1 8.7*   GFR: Estimated Creatinine Clearance: 37.5 mL/min (by C-G formula based on SCr of 0.71 mg/dL).  Scheduled Meds:  diltiazem  120 mg Oral BID   furosemide  20 mg Oral Daily   levothyroxine  75 mcg Oral Q0600   pantoprazole (PROTONIX) IV  40 mg Intravenous Q24H   pravastatin  10 mg Oral Daily   Continuous Infusions:  0.9 % NaCl with KCl 40 mEq / L 75 mL/hr at 04/12/23 1338   ciprofloxacin 400 mg (04/12/23 1217)   metronidazole  500 mg (04/12/23 1344)   Imaging and lab data was personally reviewed CT ABDOMEN PELVIS W CONTRAST  Result Date: 04/11/2023 CLINICAL DATA:  Abdominal pain with blood in stools. EXAM: CT ABDOMEN AND PELVIS WITH CONTRAST TECHNIQUE: Multidetector CT imaging of the abdomen and pelvis was performed using the standard protocol following bolus administration of intravenous contrast. RADIATION DOSE REDUCTION: This exam was performed according to the departmental dose-optimization program which includes automated exposure control, adjustment of the mA and/or kV according to patient size and/or use of iterative reconstruction technique. CONTRAST:  OMNIPAQUE IOHEXOL 300 MG/ML  SOLN COMPARISON:  11/06/2018 FINDINGS: Lower chest: The heart is enlarged. Tiny right pleural effusion with dependent atelectasis in both lung bases. Hepatobiliary: No suspicious focal abnormality within the liver parenchyma. Cholecystectomy. Common bile duct measures 9 mm diameter just proximal to the ampulla, upper normal for patient age in the setting of cholecystectomy. Pancreas: No focal mass lesion. No dilatation of the main duct. No intraparenchymal cyst. No peripancreatic edema. Spleen: No splenomegaly. No suspicious focal mass lesion. Adrenals/Urinary Tract: No adrenal nodule or mass. Kidneys unremarkable. Cortical scarring noted bilaterally. No evidence for hydroureter. The urinary bladder appears normal for the degree of distention. Stomach/Bowel: Stomach is unremarkable. No gastric wall thickening. No evidence of outlet obstruction. Duodenum is normally positioned as is the ligament of Treitz. No small bowel wall thickening. No small bowel dilatation. Diverticular are seen scattered along the length of the terminal ileum, otherwise unremarkable. Nonvisualization of the appendix is consistent with the reported history of appendectomy. Marked edema and circumferential wall thickening noted cecum and ascending colon extending into the  hepatic flexure. There is pericolonic edema/inflammation transverse colon splenic flexure, and left colon are unremarkable aside from some diverticulosis. Vascular/Lymphatic: There is moderate atherosclerotic calcification of the abdominal aorta without aneurysm. Portal vein and superior mesenteric vein are patent. Celiac axis and SMA opacified normally. IMA is opacified. No abdominal lymphadenopathy. No pelvic lymphadenopathy. Reproductive: The uterus is unremarkable.  There is no adnexal mass. Other: No intraperitoneal free fluid. Musculoskeletal: No worrisome lytic or sclerotic osseous abnormality. Mild compression deformity noted at T11 and L1. IMPRESSION: 1. Marked edema and circumferential wall thickening involving the cecum and ascending colon extending into the hepatic flexure. Imaging features are compatible with colitis, likely infectious/inflammatory. Despite the diffuse colonic diverticulosis, imaging features are not highly suggestive of diverticulitis. 2. Tiny right pleural effusion with dependent atelectasis in both lung bases. 3.  Aortic Atherosclerosis (ICD10-I70.0). Electronically Signed   By: Kennith Center M.D.   On: 04/11/2023 15:29    LOS: 1 day   Author: Calvert Cantor  04/12/2023 2:48 PM  To contact [facility]>   Check the care team in [facility] and look for the attending/consulting [facility]  provider listed  Log into [email or web address] and use [place]'s universal password   Go to> "[facility]"  and find provider  If you still have difficulty reaching the provider, please page the [facility] (Director on Call) for the Hospitalists listed on amion

## 2023-04-12 NOTE — Op Note (Signed)
[facility] Patient Name: Grace Alvarado Procedure Date: 04/12/2023 MRN: [id number] Attending MD: Starr Lake. Myrtie Neither , MD, [id number] Date of Birth: 20-Jun-1925 CSN: [id number] Age: 89 Admit Type: Inpatient Procedure:                Upper GI endoscopy Indications:              Acute post hemorrhagic anemia, Melena Providers:                Sherilyn Cooter L. Myrtie Neither, MD, Geralyn Corwin, RN, Kandice Robinsons, Technician Referring MD:              Medicines:                Monitored Anesthesia Care Complications:            No immediate complications. Estimated Blood Loss:     Estimated blood loss: none. Procedure:                Pre-Anesthesia Assessment:                           - Prior to the procedure, a History and Physical                            was performed, and patient medications and                            allergies were reviewed. The patient's tolerance of                            previous anesthesia was also reviewed. The risks                            and benefits of the procedure and the sedation                            options and risks were discussed with the patient.                            All questions were answered, and informed consent                            was obtained. Prior Anticoagulants: The patient has                            taken Xarelto (rivaroxaban), last dose was 2 days                            prior to procedure. ASA Grade Assessment: III - A                            patient with severe systemic disease. After  reviewing the risks and benefits, the patient was                            deemed in satisfactory condition to undergo the                            procedure.                           After obtaining informed consent, the endoscope was                            passed under direct vision. Throughout the                            procedure, the patient's blood  pressure, pulse, and                            oxygen saturations were monitored continuously. The                            [id number]-H190 ([id number]) Olympus endoscope was introduced                            through the mouth, and advanced to the second part                            of duodenum. The upper GI endoscopy was                            accomplished without difficulty. The patient                            tolerated the procedure well. Scope In: Scope Out: Findings:      The esophagus was normal except for a very small hiatal hernia      The stomach was normal. No fresh or old blood in the UGI tract. Clear       bile seen in stomach and duodenum.      The cardia and gastric fundus were normal on retroflexion.      The examined duodenum was normal. Impression:               - Normal esophagus except small hiatal hernia.                           - Normal stomach.                           - Normal examined duodenum.                           - No specimens collected. Moderate Sedation:      MAC sedation used Recommendation:           - Return patient to hospital ward for ongoing care.                           -  Resume regular diet.                           - Additional recommendations per GI consult note                            earlier today.                           Results and plan discussed with family. Procedure Code(s):        --- Professional ---                           [phone], Esophagogastroduodenoscopy, flexible,                            transoral; diagnostic, including collection of                            specimen(s) by brushing or washing, when performed                            (separate procedure) Diagnosis Code(s):        --- Professional ---                           D62, Acute posthemorrhagic anemia                           K92.1, Melena (includes Hematochezia) CPT copyright 2022 American [employer] Association. All rights reserved. The codes  documented in this report are preliminary and upon coder review may  be revised to meet current compliance requirements. Daryn Hicks L. Myrtie Neither, MD 04/12/2023 10:32:33 AM This report has been signed electronically. Number of Addenda: 0

## 2023-04-13 ENCOUNTER — Encounter (HOSPITAL_COMMUNITY): Payer: Self-pay | Admitting: Gastroenterology

## 2023-04-13 DIAGNOSIS — K529 Noninfective gastroenteritis and colitis, unspecified: Secondary | ICD-10-CM

## 2023-04-13 DIAGNOSIS — D62 Acute posthemorrhagic anemia: Secondary | ICD-10-CM | POA: Diagnosis not present

## 2023-04-13 DIAGNOSIS — K921 Melena: Secondary | ICD-10-CM | POA: Diagnosis not present

## 2023-04-13 LAB — GASTROINTESTINAL PANEL BY PCR, STOOL (REPLACES STOOL CULTURE)

## 2023-04-13 LAB — CBC
HCT: 27.8 % — ABNORMAL LOW (ref 36.0–46.0)
Hemoglobin: 8.5 g/dL — ABNORMAL LOW (ref 12.0–15.0)
MCH: 28.1 pg (ref 26.0–34.0)
MCHC: 30.6 g/dL (ref 30.0–36.0)
MCV: 91.7 fL (ref 80.0–100.0)
Platelets: 124 10*3/uL — ABNORMAL LOW (ref 150–400)
RBC: 3.03 MIL/uL — ABNORMAL LOW (ref 3.87–5.11)
RDW: 15.6 % — ABNORMAL HIGH (ref 11.5–15.5)
WBC: 7.8 10*3/uL (ref 4.0–10.5)
nRBC: 0 % (ref 0.0–0.2)

## 2023-04-13 LAB — BASIC METABOLIC PANEL
Anion gap: 7 (ref 5–15)
Anion gap: 6 (ref 5–15)
BUN: 12 mg/dL (ref 8–23)
BUN: 12 mg/dL (ref 8–23)
CO2: 25 mmol/L (ref 22–32)
CO2: 23 mmol/L (ref 22–32)
Calcium: 8.8 mg/dL — ABNORMAL LOW (ref 8.9–10.3)
Calcium: 8.3 mg/dL — ABNORMAL LOW (ref 8.9–10.3)
Chloride: 104 mmol/L (ref 98–111)
Chloride: 104 mmol/L (ref 98–111)
Creatinine, Ser: 0.73 mg/dL (ref 0.44–1.00)
Creatinine, Ser: 0.65 mg/dL (ref 0.44–1.00)
GFR, Estimated: >60 mL/min (ref >60)
GFR, Estimated: >60 mL/min (ref >60)
Glucose, Bld: 103 mg/dL — ABNORMAL HIGH (ref 70–99)
Glucose, Bld: 98 mg/dL (ref 70–99)
Potassium: 3.9 mmol/L (ref 3.5–5.1)
Potassium: 3.2 mmol/L — ABNORMAL LOW (ref 3.5–5.1)
Sodium: 136 mmol/L (ref 135–145)
Sodium: 133 mmol/L — ABNORMAL LOW (ref 135–145)

## 2023-04-13 MED ORDER — POTASSIUM CHLORIDE CRYS ER 10 MEQ PO TBCR
10.0000 meq | EXTENDED_RELEASE_TABLET | Freq: Every day | ORAL | Status: DC
  Administered 2023-04-13 – 2023-04-15 (×3): 10 meq via ORAL
  Filled 2023-04-13 (×3): qty 1

## 2023-04-13 MED ORDER — FUROSEMIDE 20 MG PO TABS
20.0000 mg | ORAL_TABLET | Freq: Every day | ORAL | Status: DC
  Administered 2023-04-13 – 2023-04-15 (×3): 20 mg via ORAL
  Filled 2023-04-13 (×3): qty 1

## 2023-04-13 MED ORDER — POTASSIUM CHLORIDE 20 MEQ PO PACK
20.0000 meq | PACK | Freq: Once | ORAL | Status: AC
  Administered 2023-04-13: 20 meq via ORAL
  Filled 2023-04-13: qty 1

## 2023-04-13 MED ORDER — METRONIDAZOLE 500 MG/100ML IV SOLN
500.0000 mg | Freq: Two times a day (BID) | INTRAVENOUS | Status: DC
  Administered 2023-04-13 – 2023-04-15 (×4): 500 mg via INTRAVENOUS
  Filled 2023-04-13 (×4): qty 100

## 2023-04-13 MED ORDER — CIPROFLOXACIN IN D5W 400 MG/200ML IV SOLN
400.0000 mg | Freq: Two times a day (BID) | INTRAVENOUS | Status: DC
  Administered 2023-04-13 – 2023-04-15 (×5): 400 mg via INTRAVENOUS
  Filled 2023-04-13 (×5): qty 200

## 2023-04-13 NOTE — Progress Notes (Addendum)
Progress Note   Subjective  Chief Complaint: Melena, acute blood loss anemia and colitis  Today, patient is found eating her breakfast.  Family tell me that she slept well overnight and is feeling much better.  Patient tells me her abdominal pain has decreased significantly.  She has not been able to have a bowel movement for stool studies yet.  Certainly no further bleeding.   Objective   Vital signs in last 24 hours: Temp:  [97.3 F (36.3 C)-99.3 F (37.4 C)] 98.5 F (36.9 C) (07/08 0558) Pulse Rate:  [84-107] 99 (07/08 0558) Resp:  [16-30] 20 (07/08 0558) BP: (105-138)/(43-118) 128/56 (07/08 0558) SpO2:  [93 %-100 %] 93 % (07/08 0558) Weight:  [64.4 kg] 64.4 kg (07/07 0958) Last BM Date : 04/11/23 General:    Elderly white female in NAD Heart:  Regular rate and rhythm; no murmurs Lungs: Respirations even and unlabored, lungs CTA bilaterally Abdomen:  Soft, moderate bilateral lower abdominal TTP and nondistended. Normal bowel sounds. Psych:  Cooperative. Normal mood and affect.  Intake/Output from previous day: 07/07 0701 - 07/08 0700 In: 1418.2 [P.O.:240; I.V.:878.2; IV Piggyback:300] Out: 1600 [Urine:1600]   Lab Results: Recent Labs    04/12/23 0347 04/12/23 1525 04/13/23 0857  WBC 9.4 10.9* 7.8  HGB 8.5* 8.2* 8.5*  HCT 27.6* 27.0* 27.8*  PLT 135* 138* 124*   BMET Recent Labs    04/11/23 1343 04/12/23 0347 04/13/23 0857  NA 131* 133* 136  K 3.6 3.4* 3.9  CL 98 101 104  CO2 26 26 25   GLUCOSE 114* 103* 103*  BUN 13 10 12   CREATININE 0.78 0.71 0.73  CALCIUM 9.1 8.7* 8.8*   LFT Recent Labs    04/11/23 1343  PROT 7.5  ALBUMIN 3.7  AST 26  ALT 23  ALKPHOS 70  BILITOT 0.9    Studies/Results: CT ABDOMEN PELVIS W CONTRAST  Result Date: 04/11/2023 CLINICAL DATA:  Abdominal pain with blood in stools. EXAM: CT ABDOMEN AND PELVIS WITH CONTRAST TECHNIQUE: Multidetector CT imaging of the abdomen and pelvis was performed using the standard protocol  following bolus administration of intravenous contrast. RADIATION DOSE REDUCTION: This exam was performed according to the departmental dose-optimization program which includes automated exposure control, adjustment of the mA and/or kV according to patient size and/or use of iterative reconstruction technique. CONTRAST:  OMNIPAQUE IOHEXOL 300 MG/ML  SOLN COMPARISON:  11/06/2018 FINDINGS: Lower chest: The heart is enlarged. Tiny right pleural effusion with dependent atelectasis in both lung bases. Hepatobiliary: No suspicious focal abnormality within the liver parenchyma. Cholecystectomy. Common bile duct measures 9 mm diameter just proximal to the ampulla, upper normal for patient age in the setting of cholecystectomy. Pancreas: No focal mass lesion. No dilatation of the main duct. No intraparenchymal cyst. No peripancreatic edema. Spleen: No splenomegaly. No suspicious focal mass lesion. Adrenals/Urinary Tract: No adrenal nodule or mass. Kidneys unremarkable. Cortical scarring noted bilaterally. No evidence for hydroureter. The urinary bladder appears normal for the degree of distention. Stomach/Bowel: Stomach is unremarkable. No gastric wall thickening. No evidence of outlet obstruction. Duodenum is normally positioned as is the ligament of Treitz. No small bowel wall thickening. No small bowel dilatation. Diverticular are seen scattered along the length of the terminal ileum, otherwise unremarkable. Nonvisualization of the appendix is consistent with the reported history of appendectomy. Marked edema and circumferential wall thickening noted cecum and ascending colon extending into the hepatic flexure. There is pericolonic edema/inflammation transverse colon splenic flexure, and left colon are  unremarkable aside from some diverticulosis. Vascular/Lymphatic: There is moderate atherosclerotic calcification of the abdominal aorta without aneurysm. Portal vein and superior mesenteric vein are patent. Celiac  axis and SMA opacified normally. IMA is opacified. No abdominal lymphadenopathy. No pelvic lymphadenopathy. Reproductive: The uterus is unremarkable.  There is no adnexal mass. Other: No intraperitoneal free fluid. Musculoskeletal: No worrisome lytic or sclerotic osseous abnormality. Mild compression deformity noted at T11 and L1. IMPRESSION: 1. Marked edema and circumferential wall thickening involving the cecum and ascending colon extending into the hepatic flexure. Imaging features are compatible with colitis, likely infectious/inflammatory. Despite the diffuse colonic diverticulosis, imaging features are not highly suggestive of diverticulitis. 2. Tiny right pleural effusion with dependent atelectasis in both lung bases. 3.  Aortic Atherosclerosis (ICD10-I70.0). Electronically Signed   By: Kennith Center M.D.   On: 04/11/2023 15:29     Assessment / Plan:   Assessment: 1.  Hematochezia: Maroon-colored stool, none since 04/11/2023 2.  Acute blood loss anemia: With above, hemoglobin remaining stable, underwent EGD this hospitalization which was normal 3.  Colitis: As seen on CT, currently on empiric antibiotics 4.  Abdominal pain: With above 5.  History of right colon AVM causing bleeding in 2020  Plan: 1.  Continue antibiotics 2.  Continue to monitor hemoglobin with transfusion as needed 3.  Again we will try to avoid colonoscopy, as long as blood counts stay stable and symptoms improve then will not need to proceed with this.  Thank you for your kind consultation, we will continue to follow.    LOS: 2 days   Unk Lightning  04/13/2023, 9:43 AM     Attending physician's note   I have taken history, reviewed the chart and examined the patient. I performed a substantive portion of this encounter, including complete performance of at least one of the key components, in conjunction with the APP. I agree with the Advanced Practitioner's note, impression and recommendations.   Got excellent  signout from Dr. Myrtie Neither  Pt doing very well No further abdominal pain or any bleeding No further diarrhea.  Plan: -Recommend to continue A/Bs x 7 days (total) -Advance diet -Await colonoscopy d/t age and pt's wishes. -FU GI in case of any problems -Will sign off for now -Anticipate D/C in AM   Edman Circle, MD [facility] [phone]

## 2023-04-13 NOTE — Progress Notes (Addendum)
[facility] Progress Note  Patient: Grace Alvarado     HYQ:[id number]  DOA: 04/11/2023   PCP: Pincus Sanes, MD       Brief hospital course: This is a 89 year old female with atrial fibrillation, hypertension, hypothyroidism who presents to the hospital for dark stools.  The patient lives alone and her daughters live about 10 minutes away.  The patient seems to have a great amount of difficulty detailing her history but her daughters are at bedside and essentially give the bulk of the history.  Apparently the patient had black bowel movements at home associated with nausea and therefore presented to the ED. In the ED FOBT was noted to be positive. Hemoglobin was 12.5 and 10/22/2022 and 9.6 when she presented to the ED. A GI consult was requested.   Subjective:  According to the patient, she had some loose stools today and noted a very small amount of dark stool towards the end.  There was no frank red blood.  She does admit to having mild cramping specifically prior to using the bathroom.  She does state that that this pain is not as severe as the pain she was having prior to coming to the hospital.   Assessment and Plan: Principal Problem: Bloody stool-acute anemia - 7/7> EGD reveals a small hiatal hernia, no blood, normal esophagus stomach and duodenum. - Continue to hold Eliquis and continue to follow hemoglobin -Patient and daughters are in agreement with the blood transfusion if her hemoglobin drops below 7 - Regular diet has been resumed-continue to follow  Active Problems:  Colitis - IV Cipro and Flagyl being continued -Continues to have mild crampy pain but this is improving - GI pathogen panel was obtained this morning and is pending  Low iron levels - Ferritin is 21 and Iron saturation is 21- have discussed starting oral Iron tabs when she is no longer having GI issues  Acute thrombocytopenia - Platelets dropped from 161> 135> 124 - continue to follow  Atrial  fibrillation - Holding Xarelto -awaiting for GI to clear her to resume this - Continue diltiazem  Persistence of bilateral pedal edema -This is a chronic issue - Per daughters, the patient's Lasix has been increased from every other day to daily to twice daily but she has persistent pedal edema - I have had an extensive conversation with patient and daughters regarding avoidance of excess Lasix-I have recommended elevation and TED hose - Lasix held yesterday but the patient's daughters request that it be resumed today at a dose of 20 mg daily-I have reordered it   Hypokalemia - Potassium noted to be 3.2 today -He has received a dose of potassium-follow-up tomorrow     Code Status: DNR Consultants: GI Level of Care: Level of care: Progressive Total time on patient care: 35 minutes DVT prophylaxis:  Place TED hose Start: 04/12/23 1456 SCDs Start: 04/11/23 1753     Objective:   Vitals:   04/12/23 1838 04/12/23 2042 04/12/23 2122 04/13/23 0558  BP: (!) 125/59 138/60 125/64 (!) 128/56  Pulse: (!) 107 85 (!) 102 99  Resp: 18 18 20 20   Temp: 98.2 F (36.8 C) 98.5 F (36.9 C) 99.3 F (37.4 C) 98.5 F (36.9 C)  TempSrc: Oral Oral Oral Oral  SpO2: 97% 98% 94% 93%  Weight:      Height:       Filed Weights   04/11/23 1318 04/12/23 0958  Weight: 64.4 kg 64.4 kg   Exam: General exam: Appears  comfortable  HEENT: oral mucosa moist Respiratory system: Clear to auscultation.  Cardiovascular system: S1 & S2 heard  Gastrointestinal system: Abdomen soft, non-tender, nondistended. Normal bowel sounds   Extremities: No cyanosis, clubbing -mild bilateral lower extremity edema without pitting Psychiatry:  Mood & affect appropriate.       CBC: Recent Labs  Lab 04/11/23 1343 04/12/23 0347 04/12/23 1525 04/13/23 0857  WBC 11.2* 9.4 10.9* 7.8  NEUTROABS 8.8*  --   --   --   HGB 9.6* 8.5* 8.2* 8.5*  HCT 30.8* 27.6* 27.0* 27.8*  MCV 90.9 91.4 92.2 91.7  PLT 161 135* 138* 124*     Basic Metabolic Panel: Recent Labs  Lab 04/11/23 1343 04/12/23 0347 04/13/23 0857  NA 131* 133* 136  K 3.6 3.4* 3.9  CL 98 101 104  CO2 26 26 25   GLUCOSE 114* 103* 103*  BUN 13 10 12   CREATININE 0.78 0.71 0.73  CALCIUM 9.1 8.7* 8.8*    GFR: Estimated Creatinine Clearance: 37.5 mL/min (by C-G formula based on SCr of 0.73 mg/dL).  Scheduled Meds:  diltiazem  120 mg Oral BID   furosemide  20 mg Oral Daily   levothyroxine  75 mcg Oral Q0600   pantoprazole (PROTONIX) IV  40 mg Intravenous Q24H   potassium chloride  10 mEq Oral Daily   pravastatin  10 mg Oral Daily   Continuous Infusions:   Imaging and lab data was personally reviewed CT ABDOMEN PELVIS W CONTRAST  Result Date: 04/11/2023 CLINICAL DATA:  Abdominal pain with blood in stools. EXAM: CT ABDOMEN AND PELVIS WITH CONTRAST TECHNIQUE: Multidetector CT imaging of the abdomen and pelvis was performed using the standard protocol following bolus administration of intravenous contrast. RADIATION DOSE REDUCTION: This exam was performed according to the departmental dose-optimization program which includes automated exposure control, adjustment of the mA and/or kV according to patient size and/or use of iterative reconstruction technique. CONTRAST:  OMNIPAQUE IOHEXOL 300 MG/ML  SOLN COMPARISON:  11/06/2018 FINDINGS: Lower chest: The heart is enlarged. Tiny right pleural effusion with dependent atelectasis in both lung bases. Hepatobiliary: No suspicious focal abnormality within the liver parenchyma. Cholecystectomy. Common bile duct measures 9 mm diameter just proximal to the ampulla, upper normal for patient age in the setting of cholecystectomy. Pancreas: No focal mass lesion. No dilatation of the main duct. No intraparenchymal cyst. No peripancreatic edema. Spleen: No splenomegaly. No suspicious focal mass lesion. Adrenals/Urinary Tract: No adrenal nodule or mass. Kidneys unremarkable. Cortical scarring noted bilaterally. No  evidence for hydroureter. The urinary bladder appears normal for the degree of distention. Stomach/Bowel: Stomach is unremarkable. No gastric wall thickening. No evidence of outlet obstruction. Duodenum is normally positioned as is the ligament of Treitz. No small bowel wall thickening. No small bowel dilatation. Diverticular are seen scattered along the length of the terminal ileum, otherwise unremarkable. Nonvisualization of the appendix is consistent with the reported history of appendectomy. Marked edema and circumferential wall thickening noted cecum and ascending colon extending into the hepatic flexure. There is pericolonic edema/inflammation transverse colon splenic flexure, and left colon are unremarkable aside from some diverticulosis. Vascular/Lymphatic: There is moderate atherosclerotic calcification of the abdominal aorta without aneurysm. Portal vein and superior mesenteric vein are patent. Celiac axis and SMA opacified normally. IMA is opacified. No abdominal lymphadenopathy. No pelvic lymphadenopathy. Reproductive: The uterus is unremarkable.  There is no adnexal mass. Other: No intraperitoneal free fluid. Musculoskeletal: No worrisome lytic or sclerotic osseous abnormality. Mild compression deformity noted at T11 and  L1. IMPRESSION: 1. Marked edema and circumferential wall thickening involving the cecum and ascending colon extending into the hepatic flexure. Imaging features are compatible with colitis, likely infectious/inflammatory. Despite the diffuse colonic diverticulosis, imaging features are not highly suggestive of diverticulitis. 2. Tiny right pleural effusion with dependent atelectasis in both lung bases. 3.  Aortic Atherosclerosis (ICD10-I70.0). Electronically Signed   By: Kennith Center M.D.   On: 04/11/2023 15:29    LOS: 2 days   Author: Calvert Cantor  04/13/2023 12:17 PM  To contact [facility]>   Check the care team in [facility] and look for the attending/consulting [facility] provider  listed  Log into [email or web address] and use [facility]'s universal password   Go to> "[facility]"  and find provider  If you still have difficulty reaching the provider, please page the [facility] (Director on Call) for the Hospitalists listed on amion

## 2023-04-14 DIAGNOSIS — K921 Melena: Secondary | ICD-10-CM | POA: Diagnosis not present

## 2023-04-14 LAB — CBC
HCT: 26.9 % — ABNORMAL LOW (ref 36.0–46.0)
HCT: 26.2 % — ABNORMAL LOW (ref 36.0–46.0)
Hemoglobin: 8.1 g/dL — ABNORMAL LOW (ref 12.0–15.0)
Hemoglobin: 8 g/dL — ABNORMAL LOW (ref 12.0–15.0)
MCH: 27.9 pg (ref 26.0–34.0)
MCH: 28.4 pg (ref 26.0–34.0)
MCHC: 30.1 g/dL (ref 30.0–36.0)
MCHC: 30.5 g/dL (ref 30.0–36.0)
MCV: 92.8 fL (ref 80.0–100.0)
MCV: 92.9 fL (ref 80.0–100.0)
Platelets: 128 10*3/uL — ABNORMAL LOW (ref 150–400)
Platelets: 124 10*3/uL — ABNORMAL LOW (ref 150–400)
RBC: 2.9 MIL/uL — ABNORMAL LOW (ref 3.87–5.11)
RBC: 2.82 MIL/uL — ABNORMAL LOW (ref 3.87–5.11)
RDW: 15.7 % — ABNORMAL HIGH (ref 11.5–15.5)
RDW: 15.6 % — ABNORMAL HIGH (ref 11.5–15.5)
WBC: 6.1 10*3/uL (ref 4.0–10.5)
WBC: 5.3 10*3/uL (ref 4.0–10.5)
nRBC: 0 % (ref 0.0–0.2)
nRBC: 0 % (ref 0.0–0.2)

## 2023-04-14 LAB — BASIC METABOLIC PANEL
Anion gap: 8 (ref 5–15)
BUN: 13 mg/dL (ref 8–23)
CO2: 24 mmol/L (ref 22–32)
Calcium: 8.5 mg/dL — ABNORMAL LOW (ref 8.9–10.3)
Chloride: 102 mmol/L (ref 98–111)
Creatinine, Ser: 0.72 mg/dL (ref 0.44–1.00)
GFR, Estimated: >60 mL/min (ref >60)
Glucose, Bld: 104 mg/dL — ABNORMAL HIGH (ref 70–99)
Potassium: 3.5 mmol/L (ref 3.5–5.1)
Sodium: 134 mmol/L — ABNORMAL LOW (ref 135–145)

## 2023-04-14 NOTE — Progress Notes (Signed)
[facility] Progress Note  Patient: Grace Alvarado     ZOX:[id number]  DOA: 04/11/2023   PCP: Pincus Sanes, MD       Brief hospital course: This is a 89 year old female with atrial fibrillation, hypertension, hypothyroidism who presents to the hospital for dark stools.  The patient lives alone and her daughters live about 10 minutes away.  The patient seems to have a great amount of difficulty detailing her history but her daughters are at bedside and essentially give the bulk of the history.  Apparently the patient had black bowel movements at home associated with nausea and therefore presented to the ED. In the ED FOBT was noted to be positive. Hemoglobin was 12.5 and 10/22/2022 and 9.6 when she presented to the ED. A GI consult was requested.   Subjective:  The patient has no complaints today.  She and her daughter have discussed her dropping hemoglobin which they have noted on MyChart.  I have explained that it appears that her bleeding has abated for now but we can watch her hemoglobin overnight after resuming Eliquis   Assessment and Plan: Principal Problem: Bloody stool-acute anemia - 7/7> EGD reveals a small hiatal hernia, no blood, normal esophagus stomach and duodenum. - Regular diet has been resumed-continue to follow -Hemoglobin has dropped to 8.1-the patient's and her daughters are very concerned that she may have ongoing bleeding and we have discussed this in detail We have however decided that Eliquis should be resumed today and hemoglobin can continue to be followed overnight noting that minor changes in hemoglobin can occur with each blood draw and that we would not be offering a blood transfusion unless we saw a hemoglobin drop below 7   Active Problems:  Colitis - IV Cipro and Flagyl being continued -Continues to have mild crampy pain but this is improving - GI pathogen panel was obtained and revealed Norovirus -Today the patient states that she recalls  having diarrhea at home but it did resolve and there was about a 24-hour period between the resolution of the diarrhea and the bloody stool -Has been following and did sign off yesterday as it was felt that her bleeding had resolved and the colitis was improving -I have explained that she may have a superimposed bacterial infection and I recommend that we continue Cipro and Flagyl for total of 7 days - The patient's daughters have asked that I notify GI of this new finding and I have notified Dr. Tiajuana Amass with the [facility] today-he concurs with the plan  Low iron levels - Ferritin is 21 and Iron saturation is 21 - have discussed starting oral Iron tabs when she is no longer having GI issues -Will need to be discharged home with a prescription for oral iron to start once a day and her PCP can increase this dose if needed -I have explained yesterday and again today that I would not recommend an iron transfusion at this time  Acute thrombocytopenia - Platelets dropped from 161> 135> 124 - continue to follow  Atrial fibrillation - Resume Xarelto today and discontinue heparin infusion - Continue diltiazem  Persistence of bilateral pedal edema -This is a chronic issue - Per daughters, the patient's Lasix has been increased from every other day to daily to twice daily but she has persistent pedal edema - I have had an extensive conversation with patient and daughters regarding avoidance of excess Lasix-I have recommended elevation and TED hose - Lasix initially held held but the  patient's daughters request that it be resumed at a dose of 20 mg daily-was resumed on 7/8  Hypokalemia - Potassium replaced and has improved to 3.5      Code Status: DNR Consultants: GI Level of Care: Level of care: Progressive Total time on patient care: 35 minutes DVT prophylaxis:  Place TED hose Start: 04/12/23 1456 SCDs Start: 04/11/23 1753     Objective:   Vitals:   04/13/23 2102 04/14/23  0453 04/14/23 1052 04/14/23 1439  BP: (!) 151/64 129/67 120/61 (!) 119/57  Pulse: 85 88 82 95  Resp: 18 18  (!) 21  Temp: 98.3 F (36.8 C) 97.9 F (36.6 C)  (!) 97.4 F (36.3 C)  TempSrc: Oral Oral  Oral  SpO2: 98% 95%  98%  Weight:      Height:       Filed Weights   04/11/23 1318 04/12/23 0958  Weight: 64.4 kg 64.4 kg   Exam: General exam: Appears comfortable  HEENT: oral mucosa moist Respiratory system: Clear to auscultation.  Cardiovascular system: S1 & S2 heard  Gastrointestinal system: Abdomen soft, non-tender, nondistended. Normal bowel sounds   Extremities: No cyanosis, clubbing -mild bilateral lower extremity edema without pitting Psychiatry:  Mood & affect appropriate.       CBC: Recent Labs  Lab 04/11/23 1343 04/12/23 0347 04/12/23 1525 04/13/23 0857 04/14/23 0418 04/14/23 1528  WBC 11.2* 9.4 10.9* 7.8 6.1 5.3  NEUTROABS 8.8*  --   --   --   --   --   HGB 9.6* 8.5* 8.2* 8.5* 8.1* 8.0*  HCT 30.8* 27.6* 27.0* 27.8* 26.9* 26.2*  MCV 90.9 91.4 92.2 91.7 92.8 92.9  PLT 161 135* 138* 124* 128* 124*    Basic Metabolic Panel: Recent Labs  Lab 04/11/23 1343 04/12/23 0347 04/13/23 0857 04/13/23 1254 04/14/23 0418  NA 131* 133* 136 133* 134*  K 3.6 3.4* 3.9 3.2* 3.5  CL 98 101 104 104 102  CO2 26 26 25 23 24   GLUCOSE 114* 103* 103* 98 104*  BUN 13 10 12 12 13   CREATININE 0.78 0.71 0.73 0.65 0.72  CALCIUM 9.1 8.7* 8.8* 8.3* 8.5*    GFR: Estimated Creatinine Clearance: 37.5 mL/min (by C-G formula based on SCr of 0.72 mg/dL).  Scheduled Meds:  diltiazem  120 mg Oral BID   furosemide  20 mg Oral Daily   levothyroxine  75 mcg Oral Q0600   pantoprazole (PROTONIX) IV  40 mg Intravenous Q24H   potassium chloride  10 mEq Oral Daily   pravastatin  10 mg Oral Daily   Continuous Infusions:  ciprofloxacin 400 mg (04/14/23 1305)   metronidazole 500 mg (04/14/23 1258)    Imaging and lab data was personally reviewed No results found.  LOS: 3 days    Author: Calvert Cantor  04/14/2023 5:16 PM  To contact [facility]>   Check the care team in [facility] and look for the attending/consulting [facility] provider listed  Log into [email or web address] and use [place]'s universal password   Go to> "[facility]"  and find provider  If you still have difficulty reaching the provider, please page the [facility] (Director on Call) for the Hospitalists listed on amion

## 2023-04-14 NOTE — Plan of Care (Signed)
  Problem: Clinical Measurements: Goal: Ability to maintain clinical measurements within normal limits will improve Outcome: Progressing   Problem: Activity: Goal: Risk for activity intolerance will decrease Outcome: Progressing   Problem: Education: Goal: Knowledge of General Education information will improve Description: Including pain rating scale, medication(s)/side effects and non-pharmacologic comfort measures Outcome: Not Progressing   Problem: Health Behavior/Discharge Planning: Goal: Ability to manage health-related needs will improve Outcome: Not Progressing   Problem: Nutrition: Goal: Adequate nutrition will be maintained Outcome: Not Progressing   Problem: Elimination: Goal: Will not experience complications related to urinary retention Outcome: Not Progressing

## 2023-04-14 NOTE — Plan of Care (Signed)

## 2023-04-15 ENCOUNTER — Encounter: Payer: Self-pay | Admitting: Internal Medicine

## 2023-04-15 DIAGNOSIS — K921 Melena: Secondary | ICD-10-CM | POA: Diagnosis not present

## 2023-04-15 LAB — CBC
HCT: 25.6 % — ABNORMAL LOW (ref 36.0–46.0)
Hemoglobin: 8.1 g/dL — ABNORMAL LOW (ref 12.0–15.0)
MCH: 28.8 pg (ref 26.0–34.0)
MCHC: 31.6 g/dL (ref 30.0–36.0)
MCV: 91.1 fL (ref 80.0–100.0)
Platelets: 152 10*3/uL (ref 150–400)
RBC: 2.81 MIL/uL — ABNORMAL LOW (ref 3.87–5.11)
RDW: 15.9 % — ABNORMAL HIGH (ref 11.5–15.5)
WBC: 5.1 10*3/uL (ref 4.0–10.5)
nRBC: 0 % (ref 0.0–0.2)

## 2023-04-15 MED ORDER — FERROUS SULFATE 325 (65 FE) MG PO TBEC: 325.0000 mg | DELAYED_RELEASE_TABLET | ORAL | 1 refills | Status: DC

## 2023-04-15 MED ORDER — CIPROFLOXACIN HCL 500 MG PO TABS: 500.0000 mg | ORAL_TABLET | Freq: Two times a day (BID) | ORAL | 0 refills | Status: AC

## 2023-04-15 MED ORDER — METRONIDAZOLE 500 MG PO TABS: 500.0000 mg | ORAL_TABLET | Freq: Two times a day (BID) | ORAL | 0 refills | Status: AC

## 2023-04-15 MED ORDER — RIVAROXABAN 15 MG PO TABS
15.0000 mg | ORAL_TABLET | Freq: Every day | ORAL | 1 refills | Status: AC
Start: 2023-04-16 — End: ?

## 2023-04-15 MED ORDER — PANTOPRAZOLE SODIUM 40 MG PO TBEC: 40.0000 mg | DELAYED_RELEASE_TABLET | Freq: Every day | ORAL | 1 refills | Status: DC

## 2023-04-15 NOTE — Plan of Care (Signed)

## 2023-04-15 NOTE — Progress Notes (Signed)
AVS and discharge instructions reviewed with patient and two daughters at the bedside. All parties verbalized understanding and had no further questions. Patient discharged home via daughters.

## 2023-04-15 NOTE — Discharge Summary (Signed)
Physician Discharge Summary  Grace Alvarado ZOX:[id number] DOB: [birth year] DOA: 04/11/2023  PCP: Grace Sanes, MD  Admit date: 04/11/2023 Discharge date: 04/15/2023  Time spent: 40 minutes  Recommendations for Outpatient Follow-up:  Follow outpatient CBC/CMP  Follow Hb within 1 week (resuming xarelto 7/11) - watch for recurrent sx bleeding, if recurrent, would hold xarelto and follow up with GI (consider colonoscopy) Follow with Grace Alvarado gastroenterology as needed Follow iron def anemia outpatient, started on iron Tiny right pleural effusion noted on imaging, consider CXR outpatient to follow   Discharge Diagnoses:  Principal Problem:   Melena Active Problems:   Acute blood loss anemia   Discharge Condition: stable  Diet recommendation: heart healthy  Filed Weights   04/11/23 1318 04/12/23 0958  Weight: 64.4 kg 64.4 kg    History of present illness:    89 year old female with atrial fibrillation, hypertension, hypothyroidism who presents to the hospital for dark stools.    She had CT scan notable for findings concerning for colitis as well as anemia.    GI was c/s, she's now s/p EGD with normal esophagus, stomach, duodenum.  She was started on abx to cover infectious colitis.  She's improved with IV abx and holding xarelto.  Plan to resume xarelto 7/11.    Stable at time of discharge with stable H/H.  Home health offered, but declined  See below for additional details   Hospital Course:  Assessment and Plan:  Acute GI Bleeding Acute Blood Loss Anemia Iron Deficiency Anemia -- due to suspected infectious colitis - 7/7> EGD reveals Grace Alvarado small hiatal hernia, no blood, normal esophagus stomach and duodenum. - Hb stable today, discussed with GI, they were ok with resuming xarelto 7/11 -- will discharge home with iron every other day -- brown stool this morning    Infectious Colitis Norovirus Infection Noted on CT scan Treating with abx with assumption of possible  bacterial superinfection given her bloody stool Discharge with cipro/flagyl to complete 7 day course   Acute thrombocytopenia - improved at discharge -- follow outpatient   Atrial fibrillation - Resume Xarelto 7/11 after discussion with GI -- needs repeat H/H within 1 week or so   Persistence of bilateral pedal edema -- This is Grace Alvarado chronic issue, continue lasix daily and follow outpatient with PCP   Hypokalemia - Potassium replaced and has improved to 3.5      Procedures: EGD ( I'm unable to see report via epic (error), but reportedly normal per 7/8 GI note)  Consultations: GI  Discharge Exam: Vitals:   04/15/23 0503 04/15/23 1143  BP: (!) 126/59 108/64  Pulse: 99 85  Resp: 18   Temp: 98.4 F (36.9 C)   SpO2: 96%    Anxious with regards to going home Discussed discharge plan with daughters (reassurance provided - HH offered, but declined by patient)  General: No acute distress. Cardiovascular: RRR Lungs: unlabored Abdomen: Soft, nontender, nondistended  Neurological: Alert and oriented 3. Moves all extremities 4 with equal strength. Cranial nerves II through XII grossly intact. Extremities: No clubbing or cyanosis. No edema. P  Discharge Instructions   Discharge Instructions     Call MD for:  difficulty breathing, headache or visual disturbances   Complete by: As directed    Call MD for:  extreme fatigue   Complete by: As directed    Call MD for:  hives   Complete by: As directed    Call MD for:  persistant dizziness or light-headedness   Complete by: As  directed    Call MD for:  persistant nausea and vomiting   Complete by: As directed    Call MD for:  redness, tenderness, or signs of infection (pain, swelling, redness, odor or green/yellow discharge around incision site)   Complete by: As directed    Call MD for:  severe uncontrolled pain   Complete by: As directed    Call MD for:  temperature >100.4   Complete by: As directed    Diet - low sodium  heart healthy   Complete by: As directed    Discharge instructions   Complete by: As directed    You were seen with gastrointestinal bleeding.  You had an endoscopy which was normal by gastroenterology.  We deferred Grace Alvarado colonoscopy due to the suspicion that your bleeding was related to an infectious colitis (inflammation of the colon).  You've improved on antibiotics and while holding your blood thinner.  We'll resume your xarelto tomorrow (7/11) after Grace Alvarado discussion with gastroenterology.  Please follow up repeat labs with your PCP within [name] week to ensure your blood counts remain stable.  We'll send you on iron for your iron deficiency.  Watch for signs or symptoms of bleeding.  Return for new or recurrent bleeding, black or tarry stools.    Return for new, recurrent, or worsening symptoms.  Please ask your PCP to request records from this hospitalization so they know what was done and what the next steps will be.   Increase activity slowly   Complete by: As directed       Allergies as of 04/15/2023       Reactions   Codeine Other (See Comments)   unknown   Penicillins Other (See Comments)   unknown   Vancomycin Other (See Comments)   unknown   Zithromax [azithromycin] Other (See Comments)   unknown   Latex Rash        Medication List     TAKE these medications    acetaminophen 325 MG tablet Commonly known as: TYLENOL Take 1-2 tablets (325-650 mg total) by mouth every 6 (six) hours as needed for mild pain (pain score 1-3 or temp > 100.5).   B-complex with vitamin C tablet Take 1 tablet by mouth daily.   ciprofloxacin 500 MG tablet Commonly known as: Cipro Take 1 tablet (500 mg total) by mouth 2 (two) times daily for 7 doses.   diltiazem 120 MG 12 hr capsule Commonly known as: CARDIZEM SR TAKE 1 CAPSULE BY MOUTH TWICE Grace Alvarado DAY   ferrous sulfate 325 (65 FE) MG EC tablet Take 1 tablet (325 mg total) by mouth every other day. Follow up with your PCP regarding your iron  deficiency outpatient   furosemide 20 MG tablet Commonly known as: LASIX Take 1 tablet (20 mg total) by mouth daily.   levothyroxine 75 MCG tablet Commonly known as: SYNTHROID TAKE 1 TABLET BY MOUTH EVERY DAY BEFORE BREAKFAST What changed: See the new instructions.   metroNIDAZOLE 500 MG tablet Commonly known as: Flagyl Take 1 tablet (500 mg total) by mouth 2 (two) times daily for 7 doses.   pantoprazole 40 MG tablet Commonly known as: Protonix Take 1 tablet (40 mg total) by mouth daily.   potassium chloride 10 MEQ tablet Commonly known as: Klor-Con M10 TAKE 1-2 TABLETS BY MOUTH EVERY DAY. What changed:  how much to take how to take this when to take this additional instructions   pravastatin 10 MG tablet Commonly known as: PRAVACHOL Take 1 tablet (10  mg total) by mouth daily.   Rivaroxaban 15 MG Tabs tablet Commonly known as: Xarelto Take 1 tablet (15 mg total) by mouth daily with supper. Start taking on: April 16, 2023 What changed: See the new instructions.       Allergies  Allergen Reactions   Codeine Other (See Comments)    unknown   Penicillins Other (See Comments)    unknown   Vancomycin Other (See Comments)    unknown   Zithromax [Azithromycin] Other (See Comments)    unknown   Latex Rash      The results of significant diagnostics from this hospitalization (including imaging, microbiology, ancillary and laboratory) are listed below for reference.    Significant Diagnostic Studies: CT ABDOMEN PELVIS W CONTRAST  Result Date: 04/11/2023 CLINICAL DATA:  Abdominal pain with blood in stools. EXAM: CT ABDOMEN AND PELVIS WITH CONTRAST TECHNIQUE: Multidetector CT imaging of the abdomen and pelvis was performed using the standard protocol following bolus administration of intravenous contrast. RADIATION DOSE REDUCTION: This exam was performed according to the departmental dose-optimization program which includes automated exposure control, adjustment of the  mA and/or kV according to patient size and/or use of iterative reconstruction technique. CONTRAST:  OMNIPAQUE IOHEXOL 300 MG/ML  SOLN COMPARISON:  11/06/2018 FINDINGS: Lower chest: The heart is enlarged. Tiny right pleural effusion with dependent atelectasis in both lung bases. Hepatobiliary: No suspicious focal abnormality within the liver parenchyma. Cholecystectomy. Common bile duct measures 9 mm diameter just proximal to the ampulla, upper normal for patient age in the setting of cholecystectomy. Pancreas: No focal mass lesion. No dilatation of the main duct. No intraparenchymal cyst. No peripancreatic edema. Spleen: No splenomegaly. No suspicious focal mass lesion. Adrenals/Urinary Tract: No adrenal nodule or mass. Kidneys unremarkable. Cortical scarring noted bilaterally. No evidence for hydroureter. The urinary bladder appears normal for the degree of distention. Stomach/Bowel: Stomach is unremarkable. No gastric wall thickening. No evidence of outlet obstruction. Duodenum is normally positioned as is the ligament of Treitz. No small bowel wall thickening. No small bowel dilatation. Diverticular are seen scattered along the length of the terminal ileum, otherwise unremarkable. Nonvisualization of the appendix is consistent with the reported history of appendectomy. Marked edema and circumferential wall thickening noted cecum and ascending colon extending into the hepatic flexure. There is pericolonic edema/inflammation transverse colon splenic flexure, and left colon are unremarkable aside from some diverticulosis. Vascular/Lymphatic: There is moderate atherosclerotic calcification of the abdominal aorta without aneurysm. Portal vein and superior mesenteric vein are patent. Celiac axis and SMA opacified normally. IMA is opacified. No abdominal lymphadenopathy. No pelvic lymphadenopathy. Reproductive: The uterus is unremarkable.  There is no adnexal mass. Other: No intraperitoneal free fluid.  Musculoskeletal: No worrisome lytic or sclerotic osseous abnormality. Mild compression deformity noted at T11 and L1. IMPRESSION: 1. Marked edema and circumferential wall thickening involving the cecum and ascending colon extending into the hepatic flexure. Imaging features are compatible with colitis, likely infectious/inflammatory. Despite the diffuse colonic diverticulosis, imaging features are not highly suggestive of diverticulitis. 2. Tiny right pleural effusion with dependent atelectasis in both lung bases. 3.  Aortic Atherosclerosis (ICD10-I70.0). Electronically Signed   By: Kennith Center M.D.   On: 04/11/2023 15:29    Microbiology: Recent Results (from the past 240 hour(s))  Gastrointestinal Panel by PCR , Stool     Status: Abnormal   Collection Time: 04/12/23 10:33 AM   Specimen: Stool  Result Value Ref Range Status   Campylobacter species NOT DETECTED NOT DETECTED Final  Plesimonas shigelloides NOT DETECTED NOT DETECTED Final   Salmonella species NOT DETECTED NOT DETECTED Final   Yersinia enterocolitica NOT DETECTED NOT DETECTED Final   Vibrio species NOT DETECTED NOT DETECTED Final   Vibrio cholerae NOT DETECTED NOT DETECTED Final   Enteroaggregative E coli (EAEC) NOT DETECTED NOT DETECTED Final   Enteropathogenic E coli (EPEC) NOT DETECTED NOT DETECTED Final   Enterotoxigenic E coli (ETEC) NOT DETECTED NOT DETECTED Final   Shiga like toxin producing E coli (STEC) NOT DETECTED NOT DETECTED Final   Shigella/Enteroinvasive E coli (EIEC) NOT DETECTED NOT DETECTED Final   Cryptosporidium NOT DETECTED NOT DETECTED Final   Cyclospora cayetanensis NOT DETECTED NOT DETECTED Final   Entamoeba histolytica NOT DETECTED NOT DETECTED Final   Giardia lamblia NOT DETECTED NOT DETECTED Final   Adenovirus F40/41 NOT DETECTED NOT DETECTED Final   Astrovirus NOT DETECTED NOT DETECTED Final   Norovirus GI/GII DETECTED (Mycheal [place]) NOT DETECTED Final    Comment: RESULT CALLED TO, READ BACK BY AND VERIFIED  WITH: PAMELA HAMILTON 2109 04/12/25 MU    Rotavirus Kairen Hallinan NOT DETECTED NOT DETECTED Final   Sapovirus (I, II, IV, and V) NOT DETECTED NOT DETECTED Final    Comment: Performed at [facility], [address], [place], [place] [address]     Labs: Basic Metabolic Panel: Recent Labs  Lab 04/11/23 1343 04/12/23 0347 04/13/23 0857 04/13/23 1254 04/14/23 0418  NA 131* 133* 136 133* 134*  K 3.6 3.4* 3.9 3.2* 3.5  CL 98 101 104 104 102  CO2 26 26 25 23 24   GLUCOSE 114* 103* 103* 98 104*  BUN 13 10 12 12 13   CREATININE 0.78 0.71 0.73 0.65 0.72  CALCIUM 9.1 8.7* 8.8* 8.3* 8.5*   Liver Function Tests: Recent Labs  Lab 04/11/23 1343  AST 26  ALT 23  ALKPHOS 70  BILITOT 0.9  PROT 7.5  ALBUMIN 3.7   No results for input(s): "LIPASE", "AMYLASE" in the last 168 hours. No results for input(s): "AMMONIA" in the last 168 hours. CBC: Recent Labs  Lab 04/11/23 1343 04/12/23 0347 04/12/23 1525 04/13/23 0857 04/14/23 0418 04/14/23 1528 04/15/23 0425  WBC 11.2*   < > 10.9* 7.8 6.1 5.3 5.1  NEUTROABS 8.8*  --   --   --   --   --   --   HGB 9.6*   < > 8.2* 8.5* 8.1* 8.0* 8.1*  HCT 30.8*   < > 27.0* 27.8* 26.9* 26.2* 25.6*  MCV 90.9   < > 92.2 91.7 92.8 92.9 91.1  PLT 161   < > 138* 124* 128* 124* 152   < > = values in this interval not displayed.   Cardiac Enzymes: No results for input(s): "CKTOTAL", "CKMB", "CKMBINDEX", "TROPONINI" in the last 168 hours. BNP: BNP (last 3 results) Recent Labs    11/20/22 0941  BNP 271.3*    ProBNP (last 3 results) Recent Labs    10/22/22 1138  PROBNP 380.0*    CBG: No results for input(s): "GLUCAP" in the last 168 hours.     Signed:  Lacretia Nicks MD.  [facility] 04/15/2023, 12:44 PM

## 2023-04-15 NOTE — TOC Progression Note (Addendum)
Transition of Care [facility]) - Progression Note    Patient Details  Name: Grace Alvarado MRN: [id number] Date of Birth: 24-Jan-1925  [facility] CM/SW Contact  Geni Bers, RN Phone Number: 04/15/2023, 12:29 PM  Clinical Narrative:   Spoke with pt concerning home health. Pt declined at present time home health. Explained to daughter that she could call pt's PCP for orders after discharge. This CM called to check on home health.  At present time home health agencies declined [facility] insurance that was called. Daughter Grace Alvarado called and asked if we could get [facility] for pt. Will try again to find an agency. Amy with [facility] accepted pt for HHPT. Pt's daughter Grace Alvarado is aware.    Barriers to Discharge: Continued Medical Work up  Expected Discharge Plan and Services                                               Social Determinants of Health (SDOH) Interventions SDOH Screenings   Food Insecurity: No Food Insecurity (04/11/2023)  Housing: Low Risk  (04/11/2023)  Transportation Needs: No Transportation Needs (04/11/2023)  Utilities: Not At Risk (04/11/2023)  Depression (PHQ2-9): Low Risk  (12/23/2022)  Tobacco Use: Medium Risk (04/13/2023)    Readmission Risk Interventions     No data to display

## 2023-04-16 ENCOUNTER — Telehealth: Payer: Self-pay

## 2023-04-16 NOTE — Transitions of Care (Post Inpatient/ED Visit) (Signed)
   04/16/2023  Name: Grace Alvarado MRN: [id number] DOB: [birth year]  Today's TOC FU Call Status: Today's TOC FU Call Status:: Unsuccessul Call (1st Attempt) Unsuccessful Call (1st Attempt) Date: 04/16/23  Attempted to reach the patient regarding the most recent Inpatient/ED visit.  Follow Up Plan: Additional outreach attempts will be made to reach the patient to complete the Transitions of Care (Post Inpatient/ED visit) call.   Signature  Woodfin Ganja LPN [facility] Nurse Health Advisor Direct Dial [phone]

## 2023-04-20 NOTE — Transitions of Care (Post Inpatient/ED Visit) (Signed)
04/20/2023  Name: Grace Alvarado MRN: [id number] DOB: [birth year]  Today's TOC FU Call Status: Today's TOC FU Call Status:: Unsuccessful Call (2nd Attempt) Unsuccessful Call (1st Attempt) Date: 04/16/23 Unsuccessful Call (2nd Attempt) Date: 04/20/23  Transition Care Management Follow-up Telephone Call Date of Discharge: 04/15/23 Discharge Facility: [facility]) Type of Discharge: Inpatient Admission Primary Inpatient Discharge Diagnosis:: Melena How have you been since you were released from the hospital?: Same Any questions or concerns?: Yes Patient Questions/Concerns:: (S) both legs are swollen and daughter states pt is exhausted Patient Questions/Concerns Addressed: Notified Provider of Patient Questions/Concerns  Items Reviewed: Did you receive and understand the discharge instructions provided?: Yes Medications obtained,verified, and reconciled?: Yes (Medications Reviewed) Any new allergies since your discharge?: No Dietary orders reviewed?: Yes Do you have support at home?: Yes  Medications Reviewed Today: Medications Reviewed Today     Reviewed by Merleen Nicely, LPN (Licensed Practical Nurse) on 04/20/23 at [phone]  Med List Status: <None>   Medication Order Taking? Sig Documenting Provider Last Dose Status Informant  acetaminophen (TYLENOL) 325 MG tablet [id number] Yes Take 1-2 tablets (325-650 mg total) by mouth every 6 (six) hours as needed for mild pain (pain score 1-3 or temp > 100.5). Joseph Art, DO Taking Active Family Member, Pharmacy Records  B Complex-C (B-COMPLEX WITH VITAMIN C) tablet [id number] Yes Take 1 tablet by mouth daily. Joseph Art, DO Taking Active Family Member, Pharmacy Records  diltiazem [facility] SR) 120 MG 12 hr capsule [id number] Yes TAKE 1 CAPSULE BY MOUTH TWICE A DAY Crenshaw, Madolyn Frieze, MD Taking Active Family Member, Pharmacy Records  ferrous sulfate 325 (65 FE) MG EC tablet [id number] Yes Take 1 tablet (325 mg total) by mouth every  other day. Follow up with your PCP regarding your iron deficiency outpatient Zigmund Daniel., MD Taking Active   furosemide (LASIX) 20 MG tablet [id number] Yes Take 1 tablet (20 mg total) by mouth daily. Pincus Sanes, MD Taking Active Family Member, Pharmacy Records  levothyroxine (SYNTHROID) 75 MCG tablet [id number] Yes TAKE 1 TABLET BY MOUTH EVERY DAY BEFORE BREAKFAST  Patient taking differently: Take 75 mcg by mouth daily before breakfast.   Pincus Sanes, MD Taking Active Family Member, Pharmacy Records  pantoprazole (PROTONIX) 40 MG tablet [id number] Yes Take 1 tablet (40 mg total) by mouth daily. Zigmund Daniel., MD Taking Active   potassium chloride (KLOR-CON M10) 10 MEQ tablet [id number] Yes TAKE 1-2 TABLETS BY MOUTH EVERY DAY.  Patient taking differently: Take 10 mEq by mouth daily.   Pincus Sanes, MD Taking Active Family Member, Pharmacy Records  pravastatin (PRAVACHOL) 10 MG tablet [id number] Yes Take 1 tablet (10 mg total) by mouth daily. Lewayne Bunting, MD Taking Active Family Member, Pharmacy Records  Rivaroxaban (XARELTO) 15 MG TABS tablet [id number] Yes Take 1 tablet (15 mg total) by mouth daily with supper. Zigmund Daniel., MD Taking Active             Home Care and Equipment/Supplies: Were Home Health Services Ordered?: Yes Name of Home Health Agency:: [facility] Has Agency set up a time to come to your home?: Yes First Home Health Visit Date: 04/18/23 Any new equipment or medical supplies ordered?: No  Functional Questionnaire: Do you need assistance with bathing/showering or dressing?: No Do you need assistance with meal preparation?: No Do you need assistance with eating?: No Do you have difficulty maintaining continence: No Do you need assistance  with getting out of bed/getting out of a chair/moving?: No Do you have difficulty managing or taking your medications?: Yes  Follow up appointments reviewed: PCP Follow-up appointment  confirmed?: Yes Date of PCP follow-up appointment?: 04/22/23 (daughter wants pt to be seen earlier because of pt being so exhauste/ leg swelling - will ask front desk to call pt for work in appt) Follow-up Provider: [facility] Follow-up appointment confirmed?: No Do you need transportation to your follow-up appointment?: No Do you understand care options if your condition(s) worsen?: Yes-patient verbalized understanding    SIGNATURE  Woodfin Ganja LPN [facility] Nurse Health Advisor Direct Dial [phone]

## 2023-04-21 ENCOUNTER — Telehealth: Payer: Self-pay | Admitting: Internal Medicine

## 2023-04-21 NOTE — Telephone Encounter (Signed)
 Verbals given today. °

## 2023-04-21 NOTE — Telephone Encounter (Signed)
Caller & What Company:  Grace Alvarado with [facility]   Phone Number:  [phone]   Needs Verbal orders for what service & frequency:  Physical therapy - home health - 2 times a week for 2 weeks, and once a week for 4 weeks

## 2023-04-22 ENCOUNTER — Encounter: Payer: Self-pay | Admitting: Emergency Medicine

## 2023-04-22 ENCOUNTER — Ambulatory Visit: Payer: Medicare Other | Admitting: Emergency Medicine

## 2023-04-22 ENCOUNTER — Ambulatory Visit: Payer: Medicare Other | Admitting: Family Medicine

## 2023-04-22 VITALS — BP 112/64 | HR 91 | Temp 98.0°F | Ht 62.5 in | Wt 146.1 lb

## 2023-04-22 DIAGNOSIS — I10 Essential (primary) hypertension: Secondary | ICD-10-CM | POA: Diagnosis not present

## 2023-04-22 DIAGNOSIS — E538 Deficiency of other specified B group vitamins: Secondary | ICD-10-CM

## 2023-04-22 DIAGNOSIS — I482 Chronic atrial fibrillation, unspecified: Secondary | ICD-10-CM

## 2023-04-22 DIAGNOSIS — R609 Edema, unspecified: Secondary | ICD-10-CM | POA: Diagnosis not present

## 2023-04-22 DIAGNOSIS — R600 Localized edema: Secondary | ICD-10-CM

## 2023-04-22 DIAGNOSIS — D500 Iron deficiency anemia secondary to blood loss (chronic): Secondary | ICD-10-CM

## 2023-04-22 DIAGNOSIS — Z09 Encounter for follow-up examination after completed treatment for conditions other than malignant neoplasm: Secondary | ICD-10-CM

## 2023-04-22 DIAGNOSIS — K922 Gastrointestinal hemorrhage, unspecified: Secondary | ICD-10-CM

## 2023-04-22 LAB — COMPREHENSIVE METABOLIC PANEL
ALT: 11 U/L (ref 0–35)
AST: 20 U/L (ref 0–37)
Albumin: 3.5 g/dL (ref 3.5–5.2)
Alkaline Phosphatase: 50 U/L (ref 39–117)
BUN: 11 mg/dL (ref 6–23)
CO2: 29 mEq/L (ref 19–32)
Calcium: 9.9 mg/dL (ref 8.4–10.5)
Chloride: 100 mEq/L (ref 96–112)
Creatinine, Ser: 0.75 mg/dL (ref 0.40–1.20)
GFR: 67.51 mL/min (ref >60.00)
Glucose, Bld: 102 mg/dL — ABNORMAL HIGH (ref 70–99)
Potassium: 4 mEq/L (ref 3.5–5.1)
Sodium: 136 mEq/L (ref 135–145)
Total Bilirubin: 0.6 mg/dL (ref 0.2–1.2)
Total Protein: 7.1 g/dL (ref 6.0–8.3)

## 2023-04-22 LAB — CBC WITH DIFFERENTIAL/PLATELET
Basophils Absolute: 0 10*3/uL (ref 0.0–0.1)
Basophils Relative: 0.5 % (ref 0.0–3.0)
Eosinophils Absolute: 0 10*3/uL (ref 0.0–0.7)
Eosinophils Relative: 0.8 % (ref 0.0–5.0)
HCT: 28.3 % — ABNORMAL LOW (ref 36.0–46.0)
Hemoglobin: 9.1 g/dL — ABNORMAL LOW (ref 12.0–15.0)
Lymphocytes Relative: 19.4 % (ref 12.0–46.0)
Lymphs Abs: 1.1 10*3/uL (ref 0.7–4.0)
MCHC: 32 g/dL (ref 30.0–36.0)
MCV: 86.1 fl (ref 78.0–100.0)
Monocytes Absolute: 0.5 10*3/uL (ref 0.1–1.0)
Monocytes Relative: 8.7 % (ref 3.0–12.0)
Neutro Abs: 4 10*3/uL (ref 1.4–7.7)
Neutrophils Relative %: 70.6 % (ref 43.0–77.0)
Platelets: 203 10*3/uL (ref 150.0–400.0)
RBC: 3.29 Mil/uL — ABNORMAL LOW (ref 3.87–5.11)
RDW: 16.4 % — ABNORMAL HIGH (ref 11.5–15.5)
WBC: 5.6 10*3/uL (ref 4.0–10.5)

## 2023-04-22 LAB — IRON: Iron: 23 ug/dL — ABNORMAL LOW (ref 42–145)

## 2023-04-22 MED ORDER — CYANOCOBALAMIN 1000 MCG/ML IJ SOLN
1000.0000 ug | Freq: Once | INTRAMUSCULAR | Status: AC
Start: 2023-04-22 — End: 2023-04-22
  Administered 2023-04-22: 1000 ug via INTRAMUSCULAR

## 2023-04-22 NOTE — Assessment & Plan Note (Signed)
BP Readings from Last 3 Encounters:  04/22/23 112/64  04/15/23 108/64  03/09/23 132/72  Well-controlled hypertension Continue Cardizem 120 mg twice a day

## 2023-04-22 NOTE — Patient Instructions (Signed)
Iron Deficiency Anemia, Adult  Iron deficiency anemia is when you do not have enough red blood cells or hemoglobin in your blood. This happens because you have too little iron in your body. Hemoglobin carries oxygen to parts of the body. Anemia can cause your body to not get enough oxygen. What are the causes? Not eating enough foods that have iron in them. The body not being able to take in iron well. Blood loss. What increases the risk? Having menstrual periods. Being pregnant. What are the signs or symptoms? Pale skin, lips, and nails. Weakness, dizziness, and getting tired easily. Feeling like you cannot breathe well when moving (shortness of breath). Cold hands and feet. Mild anemia may not cause any symptoms. How is this treated? This condition is treated by finding out why you do not have enough iron and then getting more iron. It may include: Adding foods to your diet that have a lot of iron. Taking iron pills (supplements). If you are pregnant or breastfeeding, you may need to take extra iron. Your diet often does not provide the amount of iron that you need. Getting more vitamin C in your diet. Vitamin C helps your body take in iron. You may need to take iron pills with a glass of orange juice or vitamin C pills. Medicines to make heavy menstrual periods lighter. Surgery or testing procedures to find what is causing the condition. You may need blood tests to see if treatment is working. If the treatment does not seem to be working, you may need more tests. Follow these instructions at home: Medicines Take over-the-counter and prescription medicines only as told by your doctor. This includes iron pills and vitamins. Taking them as told is important because too much iron can be harmful. Take iron pills when your stomach is empty. If you cannot handle this, take them with food. Do not drink milk or take antacids at the same time as your iron pills. Iron pills may turn your poop  (stool)black. If you cannot handle taking iron pills by mouth, ask your doctor about getting iron through: An IV tube. A shot (injection) into a muscle. Eating and drinking Talk with your doctor before changing the foods you eat. Your doctor may tell you to eat foods that have a lot of iron, such as: Liver. Low-fat (lean) beef. Breads and cereals that have iron added to them. Eggs. Dried fruit. Dark green, leafy vegetables. Eat fresh fruits and vegetables that are high in vitamin C. They help your body use iron. Foods with a lot of vitamin C include: Oranges. Peppers. Tomatoes. Mangoes. Managing constipation If you are taking iron pills, they may cause trouble pooping (constipation). To prevent or treat this, you may need to: Drink enough fluid to keep your pee (urine) pale yellow. Take over-the-counter or prescription medicines. Eat foods that are high in fiber. These include beans, whole grains, and fresh fruits and vegetables. Limit foods that are high in fat and sugar. These include fried or sweet foods. General instructions Return to your normal activities when your doctor says that it is safe. Keep all follow-up visits. Contact a doctor if: You feel like you may vomit (nauseous), or you vomit. You feel weak. You get light-headed when getting up from sitting or lying down. You are sweating for no reason. You have trouble pooping. You have worse breathing with physical activity. You have heaviness in your chest. Get help right away if: You faint. If this happens, do not drive yourself   to the hospital. You have a fast heartbeat, or a heartbeat that does not feel regular. Summary Iron deficiency anemia happens when you have too little iron in your body. This condition is treated by finding out why you do not have enough iron in your body and then getting more iron. Take over-the-counter and prescription medicines only as told by your doctor. Eat fresh fruits and vegetables  that are high in vitamin C. Contact a doctor if you have trouble pooping or feel weak. This information is not intended to replace advice given to you by your health care provider. Make sure you discuss any questions you have with your health care provider. Document Revised: 10/31/2021 Document Reviewed: 10/31/2021 Elsevier Patient Education  2024 [employer]  

## 2023-04-22 NOTE — Assessment & Plan Note (Signed)
Well-controlled. CBC repeated today

## 2023-04-22 NOTE — Assessment & Plan Note (Signed)
Acceptable findings today Still taking daily furosemide 20 mg CMP checked today for hypokalemia

## 2023-04-22 NOTE — Assessment & Plan Note (Signed)
CBC and iron level repeated today Secondary to GI blood loss CT scan showed colitis EGD was normal Clinically stable.  No signs of active bleeding Blood work repeated today Recommend follow-up with PCP in 1 to 2 weeks

## 2023-04-22 NOTE — Assessment & Plan Note (Signed)
Well-controlled ventricular rate Restarted on Xarelto No clinical bleeding episodes Continue Cardizem 120 mg twice a day

## 2023-04-22 NOTE — Progress Notes (Signed)
Grace Alvarado 89 y.o.   Chief Complaint  Patient presents with   Hospitalization Follow-up    Patient was in the hospital for 5 days, patient states she has been weak feeling, having some issues with her arms  Elevated HR. Iron is low, patient wants to check her iron levels, concerned about weight gain     HISTORY OF PRESENT ILLNESS: This is a 89 y.o. female here for hospital discharge follow-up.  Patient of Dr. Cheryll Cockayne. Admitted on 04/11/2023 with melena, GI bleed, low hemoglobin.  Stabilized and discharged on 04/15/2023 Here accompanied by daughter today Doing better.  No new complaints. Hospital discharge summary as follows: Physician Discharge Summary  Grace Alvarado ZOX:[id number] DOB: [birth year] DOA: 04/11/2023   PCP: Grace Sanes, MD   Admit date: 04/11/2023 Discharge date: 04/15/2023   Time spent: 40 minutes   Recommendations for Outpatient Follow-up:  Follow outpatient CBC/CMP  Follow Hb within 1 week (resuming xarelto 7/11) - watch for recurrent sx bleeding, if recurrent, would hold xarelto and follow up with GI (consider colonoscopy) Follow with Grace Alvarado as needed Follow iron def anemia outpatient, started on iron Tiny right pleural effusion noted on imaging, consider CXR outpatient to follow    Discharge Diagnoses:  Principal Problem:   Melena Active Problems:   Acute blood loss anemia  History of present illness:     89 year old female with atrial fibrillation, hypertension, hypothyroidism who presents to the hospital for dark stools.     She had CT scan notable for findings concerning for colitis as well as anemia.     GI was c/s, she's now s/p EGD with normal esophagus, stomach, duodenum.  She was started on abx to cover infectious colitis.  She's improved with IV abx and holding xarelto.  Plan to resume xarelto 7/11.     Stable at time of discharge with stable H/H.  Home health offered, but declined   See below for additional details     Hospital Course:  Assessment and Plan:   Acute GI Bleeding Acute Blood Loss Anemia Iron Deficiency Anemia -- due to suspected infectious colitis - 7/7> EGD reveals a small hiatal hernia, no blood, normal esophagus stomach and duodenum. - Hb stable today, discussed with GI, they were ok with resuming xarelto 7/11 -- will discharge home with iron every other day -- brown stool this morning    Infectious Colitis Norovirus Infection Noted on CT scan Treating with abx with assumption of possible bacterial superinfection given her bloody stool Discharge with cipro/flagyl to complete 7 day course   Acute thrombocytopenia - improved at discharge -- follow outpatient   Atrial fibrillation - Resume Xarelto 7/11 after discussion with GI -- needs repeat H/H within 1 week or so   Persistence of bilateral pedal edema -- This is a chronic issue, continue lasix daily and follow outpatient with PCP   Hypokalemia - Potassium replaced and has improved to 3.5       HPI   Prior to Admission medications   Medication Sig Start Date End Date Taking? Authorizing Provider  acetaminophen (TYLENOL) 325 MG tablet Take 1-2 tablets (325-650 mg total) by mouth every 6 (six) hours as needed for mild pain (pain score 1-3 or temp > 100.5). 03/08/20  Yes Vann, Jessica U, DO  B Complex-C (B-COMPLEX WITH VITAMIN C) tablet Take 1 tablet by mouth daily. 03/08/20  Yes Vann, Jessica U, DO  diltiazem (CARDIZEM SR) 120 MG 12 hr capsule TAKE 1 CAPSULE BY MOUTH  TWICE A DAY 11/03/22  Yes Crenshaw, Madolyn Frieze, MD  ferrous sulfate 325 (65 FE) MG EC tablet Take 1 tablet (325 mg total) by mouth every other day. Follow up with your PCP regarding your iron deficiency outpatient 04/15/23 06/14/23 Yes Zigmund Daniel., MD  furosemide (LASIX) 20 MG tablet Take 1 tablet (20 mg total) by mouth daily. 12/16/22  Yes Burns, Bobette Mo, MD  levothyroxine (SYNTHROID) 75 MCG tablet TAKE 1 TABLET BY MOUTH EVERY DAY BEFORE BREAKFAST Patient  taking differently: Take 75 mcg by mouth daily before breakfast. 12/16/22  Yes Burns, Bobette Mo, MD  pantoprazole (PROTONIX) 40 MG tablet Take 1 tablet (40 mg total) by mouth daily. 04/15/23 06/14/23 Yes Zigmund Daniel., MD  potassium chloride (KLOR-CON M10) 10 MEQ tablet TAKE 1-2 TABLETS BY MOUTH EVERY DAY. Patient taking differently: Take 10 mEq by mouth daily. 04/06/23  Yes Burns, Bobette Mo, MD  pravastatin (PRAVACHOL) 10 MG tablet Take 1 tablet (10 mg total) by mouth daily. 11/03/22  Yes Lewayne Bunting, MD  Rivaroxaban (XARELTO) 15 MG TABS tablet Take 1 tablet (15 mg total) by mouth daily with supper. 04/16/23  Yes Zigmund Daniel., MD    Allergies  Allergen Reactions   Codeine Other (See Comments)    unknown   Penicillins Other (See Comments)    unknown   Vancomycin Other (See Comments)    unknown   Zithromax [Azithromycin] Other (See Comments)    unknown   Latex Rash    Patient Active Problem List   Diagnosis Date Noted   Melena 04/12/2023   Acute blood loss anemia 04/12/2023   Benign mole 12/23/2022   Dry eyes, bilateral 07/07/2022   Bilateral leg edema 12/17/2021   Memory changes 10/29/2021   COVID 10/28/2021   Left-sided nosebleed 10/14/2021   Fatigue 08/14/2021   Urinary frequency 08/14/2021   Lumbar radiculopathy 06/25/2021   Painful rib 09/27/2020   LUQ pain 09/17/2020   Pilonidal cyst 09/17/2020   Allergic rhinitis 07/03/2020   Aortic atherosclerosis (HCC) 06/13/2020   DNR (do not resuscitate) 03/07/2020   Prepatellar bursitis, left knee 03/02/2020   Neuropathy 01/24/2020   Chronic back pain 01/24/2020   Compression fracture of T11 vertebra (HCC) 01/23/2020   Leg pain 01/03/2020   Chronic anticoagulation 01/03/2020   Compression fracture of L1 lumbar vertebra (HCC) 07/27/2019   Low back pain 07/20/2019   Hyperglycemia 05/01/2019   Constipation 11/16/2018   AVM (arteriovenous malformation) of colon    Lower GI bleed 11/06/2018   Rectal bleeding  11/05/2018   Iron deficiency anemia 11/02/2018   B12 deficiency 05/27/2018   Pulmonary hypertension, primary (HCC) 05/20/2018   Dyslipidemia 05/20/2018   CHF (congestive heart failure) (HCC) 05/20/2018   Tricuspid regurgitation 05/17/2018   Mitral regurgitation, severe 2023 05/17/2018   Chronic a-fib 05/17/2018   Essential hypertension 05/17/2018   Hypothyroid 05/17/2018   CAD (coronary artery disease) 05/17/2018   Carotid atherosclerosis, bilateral 05/17/2018    Past Medical History:  Diagnosis Date   Atrial fibrillation (HCC)    Hyperlipidemia    Hypertension    Hypothyroidism    Mitral regurgitation    MVP (mitral valve prolapse)     Past Surgical History:  Procedure Laterality Date   APPENDECTOMY     c section     CESAREAN SECTION     CHOLECYSTECTOMY     COLONOSCOPY WITH PROPOFOL N/A 11/09/2018   Procedure: COLONOSCOPY WITH PROPOFOL;  Surgeon: Sherrilyn Rist, MD;  Location: [facility] ENDOSCOPY;  Service: Alvarado;  Laterality: N/A;   ESOPHAGOGASTRODUODENOSCOPY N/A 04/12/2023   Procedure: ESOPHAGOGASTRODUODENOSCOPY (EGD);  Surgeon: Sherrilyn Rist, MD;  Location: [facility];  Service: Alvarado;  Laterality: N/A;   ESOPHAGOGASTRODUODENOSCOPY (EGD) WITH PROPOFOL N/A 11/09/2018   Procedure: ESOPHAGOGASTRODUODENOSCOPY (EGD) WITH PROPOFOL;  Surgeon: Sherrilyn Rist, MD;  Location: [facility] ENDOSCOPY;  Service: Alvarado;  Laterality: N/A;   HOT HEMOSTASIS N/A 11/09/2018   Procedure: HOT HEMOSTASIS (ARGON PLASMA COAGULATION/BICAP);  Surgeon: Sherrilyn Rist, MD;  Location: [facility] ENDOSCOPY;  Service: Alvarado;  Laterality: N/A;   I & D EXTREMITY Left 03/02/2020   Procedure: evacuation of left knee hematoma, possible wound vac placement;  Surgeon: Cammy Copa, MD;  Location: [facility] OR;  Service: Orthopedics;  Laterality: Left;   SUBMUCOSAL INJECTION  11/09/2018   Procedure: SUBMUCOSAL INJECTION;  Surgeon: Sherrilyn Rist, MD;  Location: [facility] ENDOSCOPY;   Service: Alvarado;;    Social History   Socioeconomic History   Marital status: Widowed    Spouse name: Not on file   Number of children: 3   Years of education: Not on file   Highest education level: Not on file  Occupational History   Occupation: retired  Tobacco Use   Smoking status: Former   Smokeless tobacco: Never  Advertising account planner   Vaping status: Never Used  Substance and Sexual Activity   Alcohol use: Not Currently   Drug use: Never   Sexual activity: Not Currently  Other Topics Concern   Not on file  Social History Narrative   Not on file   Social Determinants of Health   Financial Resource Strain: Not on file  Food Insecurity: No Food Insecurity (04/11/2023)   Hunger Vital Sign    Worried About Running Out of Food in the Last Year: Never true    Ran Out of Food in the Last Year: Never true  Transportation Needs: No Transportation Needs (04/11/2023)   PRAPARE - Administrator, Civil Service (Medical): No    Lack of Transportation (Non-Medical): No  Physical Activity: Not on file  Stress: Not on file  Social Connections: Not on file  Intimate Partner Violence: Not At Risk (04/11/2023)   Humiliation, Afraid, Rape, and Kick questionnaire    Fear of Current or Ex-Partner: No    Emotionally Abused: No    Physically Abused: No    Sexually Abused: No    Family History  Problem Relation Age of Onset   Cancer Mother    Dementia Mother      Review of Systems  Constitutional: Negative.  Negative for chills and fever.  HENT: Negative.  Negative for congestion and sore throat.   Respiratory: Negative.  Negative for cough and shortness of breath.   Cardiovascular:  Positive for leg swelling (Chronic lower extremity edema). Negative for chest pain and palpitations.  Gastrointestinal:  Negative for abdominal pain, nausea and vomiting.  Genitourinary: Negative.  Negative for dysuria and hematuria.  Skin: Negative.  Negative for rash.  Neurological:   Negative for dizziness and headaches.  Endo/Heme/Allergies:  Bruises/bleeds easily.  All other systems reviewed and are negative.   Vitals:   04/22/23 1055  BP: 112/64  Pulse: 91  Temp: 98 F (36.7 C)  SpO2: 95%    Physical Exam Vitals reviewed.  Constitutional:      Appearance: Normal appearance.  HENT:     Head: Normocephalic.  Eyes:     Extraocular Movements: Extraocular movements intact.  Pupils: Pupils are equal, round, and reactive to light.  Cardiovascular:     Rate and Rhythm: Normal rate. Rhythm irregular.     Heart sounds: Normal heart sounds.  Pulmonary:     Effort: Pulmonary effort is normal.     Breath sounds: Normal breath sounds.  Abdominal:     Palpations: Abdomen is soft.     Tenderness: There is no abdominal tenderness.  Musculoskeletal:     Cervical back: No tenderness.     Right lower leg: Edema present.     Left lower leg: Edema present.  Lymphadenopathy:     Cervical: No cervical adenopathy.  Skin:    General: Skin is warm and dry.  Neurological:     Mental Status: She is alert and oriented to person, place, and time.  Psychiatric:        Mood and Affect: Mood normal.        Behavior: Behavior normal.      ASSESSMENT & PLAN: A total of 47 minutes was spent with the patient and counseling/coordination of care regarding preparing for this visit, review of most recent office visit notes, review of most recent hospital discharge summary notes, review of chronic medical conditions under management, review of all medications, review most recent blood work results, review of most recent abdomen CT scan report, review of most recent EGD report, prognosis, documentation and need for follow-up.  Problem List Items Addressed This Visit       Cardiovascular and Mediastinum   Chronic a-fib    Well-controlled ventricular rate Restarted on Xarelto No clinical bleeding episodes Continue Cardizem 120 mg twice a day      Relevant Orders    Comprehensive metabolic panel   Essential hypertension    BP Readings from Last 3 Encounters:  04/22/23 112/64  04/15/23 108/64  03/09/23 132/72  Well-controlled hypertension Continue Cardizem 120 mg twice a day       Relevant Orders   Comprehensive metabolic panel     Digestive   Lower GI bleed    Well-controlled. CBC repeated today        Other   Bilateral leg edema (Chronic)    Acceptable findings today Still taking daily furosemide 20 mg CMP checked today for hypokalemia      B12 deficiency   Iron deficiency anemia - Primary    CBC and iron level repeated today Secondary to GI blood loss CT scan showed colitis EGD was normal Clinically stable.  No signs of active bleeding Blood work repeated today Recommend follow-up with PCP in 1 to 2 weeks      Relevant Orders   CBC with Differential/Platelet   Comprehensive metabolic panel   Iron   Other Visit Diagnoses     Dependent edema       Hospital discharge follow-up          Patient Instructions  Iron Deficiency Anemia, Adult  Iron deficiency anemia is when you do not have enough red blood cells or hemoglobin in your blood. This happens because you have too little iron in your body. Hemoglobin carries oxygen to parts of the body. Anemia can cause your body to not get enough oxygen. What are the causes? Not eating enough foods that have iron in them. The body not being able to take in iron well. Blood loss. What increases the risk? Having menstrual periods. Being pregnant. What are the signs or symptoms? Pale skin, lips, and nails. Weakness, dizziness, and getting tired easily. Feeling like  you cannot breathe well when moving (shortness of breath). Cold hands and feet. Mild anemia may not cause any symptoms. How is this treated? This condition is treated by finding out why you do not have enough iron and then getting more iron. It may include: Adding foods to your diet that have a lot of  iron. Taking iron pills (supplements). If you are pregnant or breastfeeding, you may need to take extra iron. Your diet often does not provide the amount of iron that you need. Getting more vitamin C in your diet. Vitamin C helps your body take in iron. You may need to take iron pills with a glass of orange juice or vitamin C pills. Medicines to make heavy menstrual periods lighter. Surgery or testing procedures to find what is causing the condition. You may need blood tests to see if treatment is working. If the treatment does not seem to be working, you may need more tests. Follow these instructions at home: Medicines Take over-the-counter and prescription medicines only as told by your doctor. This includes iron pills and vitamins. Taking them as told is important because too much iron can be harmful. Take iron pills when your stomach is empty. If you cannot handle this, take them with food. Do not drink milk or take antacids at the same time as your iron pills. Iron pills may turn your poop (stool)black. If you cannot handle taking iron pills by mouth, ask your doctor about getting iron through: An IV tube. A shot (injection) into a muscle. Eating and drinking Talk with your doctor before changing the foods you eat. Your doctor may tell you to eat foods that have a lot of iron, such as: Liver. Low-fat (lean) beef. Breads and cereals that have iron added to them. Eggs. Dried fruit. Dark green, leafy vegetables. Eat fresh fruits and vegetables that are high in vitamin C. They help your body use iron. Foods with a lot of vitamin C include: Oranges. Peppers. Tomatoes. Mangoes. Managing constipation If you are taking iron pills, they may cause trouble pooping (constipation). To prevent or treat this, you may need to: Drink enough fluid to keep your pee (urine) pale yellow. Take over-the-counter or prescription medicines. Eat foods that are high in fiber. These include beans, whole  grains, and fresh fruits and vegetables. Limit foods that are high in fat and sugar. These include fried or sweet foods. General instructions Return to your normal activities when your doctor says that it is safe. Keep all follow-up visits. Contact a doctor if: You feel like you may vomit (nauseous), or you vomit. You feel weak. You get light-headed when getting up from sitting or lying down. You are sweating for no reason. You have trouble pooping. You have worse breathing with physical activity. You have heaviness in your chest. Get help right away if: You faint. If this happens, do not drive yourself to the hospital. You have a fast heartbeat, or a heartbeat that does not feel regular. Summary Iron deficiency anemia happens when you have too little iron in your body. This condition is treated by finding out why you do not have enough iron in your body and then getting more iron. Take over-the-counter and prescription medicines only as told by your doctor. Eat fresh fruits and vegetables that are high in vitamin C. Contact a doctor if you have trouble pooping or feel weak. This information is not intended to replace advice given to you by your health care provider. Make sure you  discuss any questions you have with your health care provider. Document Revised: 10/31/2021 Document Reviewed: 10/31/2021 Elsevier Patient Education  2024 Elsevier Inc.      Edwina Barth, MD [facility]

## 2023-04-24 DIAGNOSIS — E039 Hypothyroidism, unspecified: Secondary | ICD-10-CM

## 2023-04-24 DIAGNOSIS — E785 Hyperlipidemia, unspecified: Secondary | ICD-10-CM

## 2023-04-24 DIAGNOSIS — Z7901 Long term (current) use of anticoagulants: Secondary | ICD-10-CM

## 2023-04-24 DIAGNOSIS — D62 Acute posthemorrhagic anemia: Secondary | ICD-10-CM

## 2023-04-24 DIAGNOSIS — I4821 Permanent atrial fibrillation: Secondary | ICD-10-CM

## 2023-04-24 DIAGNOSIS — I119 Hypertensive heart disease without heart failure: Secondary | ICD-10-CM

## 2023-04-24 DIAGNOSIS — I340 Nonrheumatic mitral (valve) insufficiency: Secondary | ICD-10-CM

## 2023-04-24 DIAGNOSIS — I341 Nonrheumatic mitral (valve) prolapse: Secondary | ICD-10-CM

## 2023-04-24 DIAGNOSIS — K921 Melena: Secondary | ICD-10-CM

## 2023-05-04 ENCOUNTER — Ambulatory Visit: Payer: Medicare Other

## 2023-05-06 ENCOUNTER — Ambulatory Visit: Payer: Medicare Other

## 2023-05-10 ENCOUNTER — Encounter: Payer: Self-pay | Admitting: Internal Medicine

## 2023-05-10 NOTE — Patient Instructions (Incomplete)
      Blood work was ordered.   The lab is on the first floor.    Medications changes include :       A referral was ordered and someone will call you to schedule an appointment.     No follow-ups on file.

## 2023-05-10 NOTE — Progress Notes (Unsigned)
Subjective:    Patient ID: Grace Alvarado, female    DOB: 1925/09/19, 89 y.o.   MRN: [id number]     HPI Grace Alvarado is here for follow up from the hospital-did see Dr. Alvy Alvarado on 7/17.    04/11/23 - Presented to ED with dark stools  Admitted 7/6-7/10 for melena, acute blood loss anemia  The night prior to admission she had a dark bowel movement.  She had 4 black bowel movements throughout the night without any bright red blood.  She had mild nausea and mild epigastric pain, but no vomiting, diarrhea, shortness of breath, dizziness or fevers.  ED: VSS, hemoglobin 9.6-baseline 12, FOBT positive.  GI consulted.  Acute GI bleed, acute blood loss anemia, iron deficiency anemia: Hemodynamically stable Initial hemoglobin 9.6-baseline 12, FOBT positive GI consult EGD with normal esophagus, stomach, duodenum CT abdomen/pelvis-concerning for colitis Acute GI bleed suspected infectious colitis Received antibiotics Xarelto initially held-was resumed 7/11 Discharge home with iron every other day No melena upon discharge  Infectious colitis: Norovirus infection Seen on CT scan Treated with antibiotics for the assumption of possible bacterial superinfection given bloody stool Discharged with Cipro/Flagyl-to complete 7-day course  Acute thrombocytopenia: Improved upon discharge Recheck CBC, monitor  Atrial fibrillation: Xarelto initially held-resume 7/11 Monitor CBC  Chronic bilateral pedal edema: Continue Lasix  Hypokalemia: Potassium replaced-improved to 3.5   Legs more tired.  Memory is worse since the hospital.  She was hospitalized for 5 days.  Her daughter states she falls asleep more easily when sitting during the day.  Doing PT.  She walks a little.  Tolerating iron every other day.  Medications and allergies reviewed with patient and updated if appropriate.  Current Outpatient Medications on File Prior to Visit  Medication Sig Dispense Refill   acetaminophen  (TYLENOL) 325 MG tablet Take 1-2 tablets (325-650 mg total) by mouth every 6 (six) hours as needed for mild pain (pain score 1-3 or temp > 100.5).     B Complex-C (B-COMPLEX WITH VITAMIN C) tablet Take 1 tablet by mouth daily.     diltiazem (CARDIZEM SR) 120 MG 12 hr capsule TAKE 1 CAPSULE BY MOUTH TWICE A DAY 180 capsule 3   ferrous sulfate 325 (65 FE) MG EC tablet Take 1 tablet (325 mg total) by mouth every other day. Follow up with your PCP regarding your iron deficiency outpatient 15 tablet 1   furosemide (LASIX) 20 MG tablet Take 1 tablet (20 mg total) by mouth daily. 90 tablet 3   levothyroxine (SYNTHROID) 75 MCG tablet TAKE 1 TABLET BY MOUTH EVERY DAY BEFORE BREAKFAST (Patient taking differently: Take 75 mcg by mouth daily before breakfast.) 90 tablet 1   pantoprazole (PROTONIX) 40 MG tablet Take 1 tablet (40 mg total) by mouth daily. 30 tablet 1   potassium chloride (KLOR-CON M10) 10 MEQ tablet TAKE 1-2 TABLETS BY MOUTH EVERY DAY. (Patient taking differently: Take 10 mEq by mouth daily.) 90 tablet 3   pravastatin (PRAVACHOL) 10 MG tablet Take 1 tablet (10 mg total) by mouth daily. 90 tablet 3   Rivaroxaban (XARELTO) 15 MG TABS tablet Take 1 tablet (15 mg total) by mouth daily with supper. 90 tablet 1   No current facility-administered medications on file prior to visit.     Review of Systems  Constitutional:  Positive for fatigue. Negative for appetite change, chills and fever.  HENT:  Negative for trouble swallowing.   Respiratory:  Negative for cough, shortness of breath and wheezing.  Cardiovascular:  Positive for leg swelling. Negative for chest pain and palpitations.  Gastrointestinal:  Negative for abdominal pain, blood in stool (dark stools at times), constipation, diarrhea and nausea.  Neurological:  Negative for light-headedness and headaches.       Objective:   Vitals:   05/11/23 1337  BP: 122/78  Pulse: 80  Temp: 97.9 F (36.6 C)  SpO2: 97%   BP Readings from  Last 3 Encounters:  05/11/23 122/78  04/22/23 112/64  04/15/23 108/64   Wt Readings from Last 3 Encounters:  05/11/23 148 lb (67.1 kg)  04/22/23 146 lb 2 oz (66.3 kg)  04/12/23 141 lb 15.6 oz (64.4 kg)   Body mass index is 26.64 kg/m.    Physical Exam Constitutional:      General: She is not in acute distress.    Appearance: Normal appearance.  HENT:     Head: Normocephalic and atraumatic.  Eyes:     Conjunctiva/sclera: Conjunctivae normal.  Cardiovascular:     Rate and Rhythm: Normal rate and regular rhythm.     Heart sounds: Murmur heard.  Pulmonary:     Effort: Pulmonary effort is normal. No respiratory distress.     Breath sounds: Normal breath sounds. No wheezing.  Abdominal:     General: There is no distension.     Palpations: Abdomen is soft.     Tenderness: There is no abdominal tenderness.  Musculoskeletal:     Cervical back: Neck supple.     Right lower leg: Edema (1+ pitting) present.     Left lower leg: Edema (1+ pitting) present.  Lymphadenopathy:     Cervical: No cervical adenopathy.  Skin:    General: Skin is warm and dry.     Findings: No rash.  Neurological:     Mental Status: She is alert. Mental status is at baseline.  Psychiatric:        Mood and Affect: Mood normal.        Behavior: Behavior normal.        Lab Results  Component Value Date   WBC 5.6 04/22/2023   HGB 9.1 (L) 04/22/2023   HCT 28.3 (L) 04/22/2023   PLT 203.0 04/22/2023   GLUCOSE 102 (H) 04/22/2023   CHOL 130 10/22/2022   TRIG 57.0 10/22/2022   HDL 64.50 10/22/2022   LDLCALC 54 10/22/2022   ALT 11 04/22/2023   AST 20 04/22/2023   NA 136 04/22/2023   K 4.0 04/22/2023   CL 100 04/22/2023   CREATININE 0.75 04/22/2023   BUN 11 04/22/2023   CO2 29 04/22/2023   TSH 4.01 10/22/2022   INR 2.64 05/21/2018   HGBA1C 5.2 12/18/2020     Assessment & Plan:    See Problem List for Assessment and Plan of chronic medical problems.

## 2023-05-11 ENCOUNTER — Ambulatory Visit: Payer: Medicare Other | Admitting: Internal Medicine

## 2023-05-11 VITALS — BP 122/78 | HR 80 | Temp 97.9°F | Ht 62.5 in | Wt 148.0 lb

## 2023-05-11 DIAGNOSIS — I10 Essential (primary) hypertension: Secondary | ICD-10-CM | POA: Diagnosis not present

## 2023-05-11 DIAGNOSIS — R600 Localized edema: Secondary | ICD-10-CM

## 2023-05-11 DIAGNOSIS — R5383 Other fatigue: Secondary | ICD-10-CM | POA: Diagnosis not present

## 2023-05-11 DIAGNOSIS — K922 Gastrointestinal hemorrhage, unspecified: Secondary | ICD-10-CM | POA: Diagnosis not present

## 2023-05-11 DIAGNOSIS — D62 Acute posthemorrhagic anemia: Secondary | ICD-10-CM

## 2023-05-11 NOTE — Assessment & Plan Note (Signed)
Acute Experiencing increased fatigue Likely secondary to recent hospitalization, anemia along with chronic medical problems Hopefully will continue to slowly improve

## 2023-05-11 NOTE — Assessment & Plan Note (Signed)
Chronic Blood pressure today well-controlled Continue diltiazem 120 mg twice daily  

## 2023-05-11 NOTE — Assessment & Plan Note (Signed)
Chronic Continue Lasix 20 mg daily and potassium daily Continue elevating legs whenever sitting Continue regular walking

## 2023-05-11 NOTE — Assessment & Plan Note (Signed)
Acute Recent hospitalization for GI bleed-melena Associated with acute blood loss anemia EGD with normal esophagus, stomach, duodenum CT ab/pelvis concerning for infectious colitis-likely viral Hemoglobin improved after hospitalization Tolerating iron every other day-continue No symptoms consistent with active GI bleeding now Recheck CBC when she comes in for her B12 injection later this month

## 2023-05-11 NOTE — Assessment & Plan Note (Signed)
Acute Related to lower GI bleed Hemoglobin improved just after discharge from hospital Tolerating iron every other day No symptoms consistent with active GI bleed Repeat blood counts when she comes in for B12 injection later this month

## 2023-05-12 ENCOUNTER — Encounter: Payer: Self-pay | Admitting: Internal Medicine

## 2023-05-12 NOTE — Telephone Encounter (Signed)
Med was rx by Zigmund Daniel., MD. Should she continue.Marland KitchenRaechel Chute

## 2023-05-14 ENCOUNTER — Telehealth: Payer: Self-pay | Admitting: Internal Medicine

## 2023-05-14 NOTE — Telephone Encounter (Signed)
Message left today with verbals. 

## 2023-05-14 NOTE — Telephone Encounter (Signed)
Okay for orders? 

## 2023-05-14 NOTE — Telephone Encounter (Signed)
Theodoro Clock from [facility] wanting verbal for [facility] PT for         1 week 4. She also stated pt right leg is swelling.  Best call back number [phone]

## 2023-05-27 ENCOUNTER — Ambulatory Visit: Payer: Medicare Other

## 2023-05-27 ENCOUNTER — Encounter: Payer: Self-pay | Admitting: Internal Medicine

## 2023-05-27 ENCOUNTER — Ambulatory Visit: Payer: Medicare Other | Admitting: Internal Medicine

## 2023-05-27 VITALS — BP 118/60 | HR 97 | Temp 97.8°F | Ht 62.5 in | Wt 149.6 lb

## 2023-05-27 DIAGNOSIS — K922 Gastrointestinal hemorrhage, unspecified: Secondary | ICD-10-CM

## 2023-05-27 DIAGNOSIS — D509 Iron deficiency anemia, unspecified: Secondary | ICD-10-CM

## 2023-05-27 DIAGNOSIS — K219 Gastro-esophageal reflux disease without esophagitis: Secondary | ICD-10-CM | POA: Diagnosis not present

## 2023-05-27 DIAGNOSIS — E538 Deficiency of other specified B group vitamins: Secondary | ICD-10-CM | POA: Diagnosis not present

## 2023-05-27 DIAGNOSIS — D62 Acute posthemorrhagic anemia: Secondary | ICD-10-CM | POA: Diagnosis not present

## 2023-05-27 DIAGNOSIS — R600 Localized edema: Secondary | ICD-10-CM

## 2023-05-27 LAB — CBC WITH DIFFERENTIAL/PLATELET
Basophils Absolute: 0 10*3/uL (ref 0.0–0.1)
Basophils Relative: 0.6 % (ref 0.0–3.0)
Eosinophils Absolute: 0 10*3/uL (ref 0.0–0.7)
Eosinophils Relative: 0.8 % (ref 0.0–5.0)
HCT: 29 % — ABNORMAL LOW (ref 36.0–46.0)
Hemoglobin: 9.2 g/dL — ABNORMAL LOW (ref 12.0–15.0)
Lymphocytes Relative: 25.8 % (ref 12.0–46.0)
Lymphs Abs: 1.1 10*3/uL (ref 0.7–4.0)
MCHC: 31.6 g/dL (ref 30.0–36.0)
MCV: 83.3 fl (ref 78.0–100.0)
Monocytes Absolute: 0.5 10*3/uL (ref 0.1–1.0)
Monocytes Relative: 11.5 % (ref 3.0–12.0)
Neutro Abs: 2.7 10*3/uL (ref 1.4–7.7)
Neutrophils Relative %: 61.3 % (ref 43.0–77.0)
Platelets: 157 10*3/uL (ref 150.0–400.0)
RBC: 3.49 Mil/uL — ABNORMAL LOW (ref 3.87–5.11)
RDW: 18.1 % — ABNORMAL HIGH (ref 11.5–15.5)
WBC: 4.3 10*3/uL (ref 4.0–10.5)

## 2023-05-27 LAB — IBC PANEL
Iron: 45 ug/dL (ref 42–145)
Saturation Ratios: 9.5 % — ABNORMAL LOW (ref 20.0–50.0)
TIBC: 473.2 ug/dL — ABNORMAL HIGH (ref 250.0–450.0)
Transferrin: 338 mg/dL (ref 212.0–360.0)

## 2023-05-27 LAB — BASIC METABOLIC PANEL
BUN: 9 mg/dL (ref 6–23)
CO2: 29 meq/L (ref 19–32)
Calcium: 10.1 mg/dL (ref 8.4–10.5)
Chloride: 99 meq/L (ref 96–112)
Creatinine, Ser: 0.72 mg/dL (ref 0.40–1.20)
GFR: 70.85 mL/min (ref >60.00)
Glucose, Bld: 102 mg/dL — ABNORMAL HIGH (ref 70–99)
Potassium: 4 meq/L (ref 3.5–5.1)
Sodium: 134 meq/L — ABNORMAL LOW (ref 135–145)

## 2023-05-27 MED ORDER — CYANOCOBALAMIN 1000 MCG/ML IJ SOLN
1000.0000 ug | Freq: Once | INTRAMUSCULAR | Status: AC
Start: 2023-05-27 — End: 2023-05-27
  Administered 2023-05-27: 1000 ug via INTRAMUSCULAR

## 2023-05-27 MED ORDER — PANTOPRAZOLE SODIUM 40 MG PO TBEC: 40.0000 mg | DELAYED_RELEASE_TABLET | ORAL | 3 refills | Status: DC

## 2023-05-27 MED ORDER — FUROSEMIDE 20 MG PO TABS: 40.0000 mg | ORAL_TABLET | Freq: Every day | ORAL | 3 refills | Status: DC

## 2023-05-27 NOTE — Assessment & Plan Note (Signed)
Subacute Was started on pantoprazole while in the hospital Never had GERD and was on medication prior to that When we stopped the medication hoping that she no longer needed she did start to experience reflux Given history of GI bleeds we will have her go back on the pantoprazole 40 mg every other day May be able to taper slowly in the future

## 2023-05-27 NOTE — Assessment & Plan Note (Signed)
Chronic Edema is worse,?  Slight increase in shortness of breath, weight has increased but has been stable Family has increased her Lasix dose without much improvement Continue Lasix 40 mg daily-just increased to this yesterday-hopefully they will start to see decreased edema with continuing this dose BMP today May need to adjust potassium intake If edema does not seem to be responding to furosemide 40 mg daily may consider changing to torsemide 20

## 2023-05-27 NOTE — Assessment & Plan Note (Signed)
Acute on chronic with recent lower GI bleed Taking iron every other day-continue CBC, iron panel today No symptoms with active GI bleed

## 2023-05-27 NOTE — Progress Notes (Signed)
Subjective:    Patient ID: Grace Alvarado, female    DOB: 1924-10-13, 89 y.o.   MRN: [id number]      HPI [name] is here for  Chief Complaint  Patient presents with   Leg Swelling    Bilateral leg swelling (all the time)   Heartburn    Patient stopped the Pantoprazole; Also she is having sweats under her neck; [name] request labs to check her kidney function    GERD -she has experienced increased GERD recently.  She had stopped the pantoprazole-this was started with her hospitalization and never had GERD was on this medication prior to that.  Has been taking Tums and that has been helpful    Leg swelling, weight gain -has had increased bilateral leg swelling.  Swelling does not improve overnight.  Swelling does not seem to be improving with furosemide.  She was taking 20 mg of the furosemide daily.  Her daughter increased it last week to 40 mg alternating with 20 mg and still did not see much improvement in the swelling.  Yesterday she started 40 mg daily and has not seen any change as of yet.  Her weight has increased on the scale, but has been stable.  She denies any increased shortness of breath-does have some chronic shortness of breath, but her daughter wonders if there is a slightly increased shortness of breath.    No change in diet.  Walking a little.  Elevates legs.   Taking iron every other day  Medications and allergies reviewed with patient and updated if appropriate.  Current Outpatient Medications on File Prior to Visit  Medication Sig Dispense Refill   acetaminophen (TYLENOL) 325 MG tablet Take 1-2 tablets (325-650 mg total) by mouth every 6 (six) hours as needed for mild pain (pain score 1-3 or temp > 100.5).     B Complex-C (B-COMPLEX WITH VITAMIN C) tablet Take 1 tablet by mouth daily.     diltiazem (CARDIZEM SR) 120 MG 12 hr capsule TAKE 1 CAPSULE BY MOUTH TWICE A DAY 180 capsule 3   ferrous sulfate 325 (65 FE) MG EC tablet Take 1 tablet (325 mg total) by  mouth every other day. Follow up with your PCP regarding your iron deficiency outpatient 15 tablet 1   furosemide (LASIX) 20 MG tablet Take 1 tablet (20 mg total) by mouth daily. 90 tablet 3   levothyroxine (SYNTHROID) 75 MCG tablet TAKE 1 TABLET BY MOUTH EVERY DAY BEFORE BREAKFAST (Patient taking differently: Take 75 mcg by mouth daily before breakfast.) 90 tablet 1   potassium chloride (KLOR-CON M10) 10 MEQ tablet TAKE 1-2 TABLETS BY MOUTH EVERY DAY. (Patient taking differently: Take 10 mEq by mouth daily.) 90 tablet 3   pravastatin (PRAVACHOL) 10 MG tablet Take 1 tablet (10 mg total) by mouth daily. 90 tablet 3   Rivaroxaban (XARELTO) 15 MG TABS tablet Take 1 tablet (15 mg total) by mouth daily with supper. 90 tablet 1   No current facility-administered medications on file prior to visit.    Review of Systems  Respiratory:  Positive for shortness of breath (some increase wiht exertion). Negative for cough and wheezing.   Cardiovascular:  Positive for leg swelling. Negative for chest pain and palpitations.       Objective:   Vitals:   05/27/23 1040  BP: 118/60  Pulse: 97  Temp: 97.8 F (36.6 C)  SpO2: 99%   BP Readings from Last 3 Encounters:  05/27/23 118/60  05/11/23 122/78  04/22/23 112/64   Wt Readings from Last 3 Encounters:  05/27/23 149 lb 9.6 oz (67.9 kg)  05/11/23 148 lb (67.1 kg)  04/22/23 146 lb 2 oz (66.3 kg)   Body mass index is 26.93 kg/m.    Physical Exam Constitutional:      General: She is not in acute distress.    Appearance: Normal appearance. She is not ill-appearing.  Cardiovascular:     Rate and Rhythm: Normal rate and regular rhythm.  Pulmonary:     Effort: Pulmonary effort is normal. No respiratory distress.     Breath sounds: Normal breath sounds. No wheezing or rales.  Musculoskeletal:     Right lower leg: Edema (Swelling 1+ up to posterior upper leg) present.     Left lower leg: Edema (Swelling 1+ up to posterior upper leg) present.   Skin:    General: Skin is warm and dry.     Findings: No erythema.  Neurological:     Mental Status: She is alert.            Assessment & Plan:    See Problem List for Assessment and Plan of chronic medical problems.

## 2023-05-27 NOTE — Patient Instructions (Addendum)
     B12 injection today    Blood work was ordered.      Medications changes include :   continue furosemide 40 mg daily,  restart pantoprazole 40 mg - take every other day.       Return if symptoms worsen or fail to improve.

## 2023-05-27 NOTE — Assessment & Plan Note (Signed)
Chronic Continue monthly B12 injections Injection due today-given

## 2023-05-28 ENCOUNTER — Encounter: Payer: Self-pay | Admitting: Internal Medicine

## 2023-05-28 ENCOUNTER — Ambulatory Visit: Payer: Medicare Other

## 2023-05-29 LAB — FERRITIN: Ferritin: 20 ng/mL (ref 10.0–291.0)

## 2023-06-01 ENCOUNTER — Encounter: Payer: Self-pay | Admitting: Internal Medicine

## 2023-06-03 ENCOUNTER — Telehealth: Payer: Self-pay | Admitting: Internal Medicine

## 2023-06-03 NOTE — Telephone Encounter (Signed)
Okay for orders? 

## 2023-06-03 NOTE — Telephone Encounter (Signed)
Christy from [employer] called to request 1 additional visit for next week so that they can issue discharge pw. Pt was already scheduled for discharge but the insurance [facility] has also issued non coverage forms to [employer].   Please call Neysa Bonito to confirm the additional visit next week: [phone]

## 2023-06-04 NOTE — Telephone Encounter (Signed)
Verbals given today. °

## 2023-06-05 ENCOUNTER — Ambulatory Visit: Payer: Medicare Other | Admitting: Internal Medicine

## 2023-06-09 ENCOUNTER — Other Ambulatory Visit: Payer: Self-pay

## 2023-06-09 ENCOUNTER — Encounter: Payer: Self-pay | Admitting: Internal Medicine

## 2023-06-09 MED ORDER — FERROUS SULFATE 325 (65 FE) MG PO TBEC: 325.0000 mg | DELAYED_RELEASE_TABLET | ORAL | 1 refills | Status: DC

## 2023-06-12 ENCOUNTER — Encounter: Payer: Self-pay | Admitting: Internal Medicine

## 2023-06-14 ENCOUNTER — Encounter: Payer: Self-pay | Admitting: Internal Medicine

## 2023-06-14 NOTE — Progress Notes (Unsigned)
    Subjective:    Patient ID: Grace Alvarado, female    DOB: 1925-02-08, 89 y.o.   MRN: [id number]      HPI Mayliah is here for No chief complaint on file.   Swollen legs -      Medications and allergies reviewed with patient and updated if appropriate.  Current Outpatient Medications on File Prior to Visit  Medication Sig Dispense Refill   acetaminophen (TYLENOL) 325 MG tablet Take 1-2 tablets (325-650 mg total) by mouth every 6 (six) hours as needed for mild pain (pain score 1-3 or temp > 100.5).     B Complex-C (B-COMPLEX WITH VITAMIN C) tablet Take 1 tablet by mouth daily.     diltiazem (CARDIZEM SR) 120 MG 12 hr capsule TAKE 1 CAPSULE BY MOUTH TWICE A DAY 180 capsule 3   ferrous sulfate 325 (65 FE) MG EC tablet Take 1 tablet (325 mg total) by mouth every other day. Follow up with your PCP regarding your iron deficiency outpatient 15 tablet 1   furosemide (LASIX) 20 MG tablet Take 2 tablets (40 mg total) by mouth daily. 180 tablet 3   levothyroxine (SYNTHROID) 75 MCG tablet TAKE 1 TABLET BY MOUTH EVERY DAY BEFORE BREAKFAST (Patient taking differently: Take 75 mcg by mouth daily before breakfast.) 90 tablet 1   pantoprazole (PROTONIX) 40 MG tablet Take 1 tablet (40 mg total) by mouth every other day. 45 tablet 3   potassium chloride (KLOR-CON M10) 10 MEQ tablet TAKE 1-2 TABLETS BY MOUTH EVERY DAY. (Patient taking differently: Take 10 mEq by mouth daily.) 90 tablet 3   pravastatin (PRAVACHOL) 10 MG tablet Take 1 tablet (10 mg total) by mouth daily. 90 tablet 3   Rivaroxaban (XARELTO) 15 MG TABS tablet Take 1 tablet (15 mg total) by mouth daily with supper. 90 tablet 1   No current facility-administered medications on file prior to visit.    Review of Systems     Objective:  There were no vitals filed for this visit. BP Readings from Last 3 Encounters:  05/27/23 118/60  05/11/23 122/78  04/22/23 112/64   Wt Readings from Last 3 Encounters:  05/27/23 149 lb 9.6 oz (67.9  kg)  05/11/23 148 lb (67.1 kg)  04/22/23 146 lb 2 oz (66.3 kg)   There is no height or weight on file to calculate BMI.    Physical Exam         Assessment & Plan:    See Problem List for Assessment and Plan of chronic medical problems.

## 2023-06-15 ENCOUNTER — Encounter: Payer: Self-pay | Admitting: Internal Medicine

## 2023-06-15 ENCOUNTER — Telehealth: Payer: Self-pay

## 2023-06-15 ENCOUNTER — Ambulatory Visit: Payer: Medicare Other | Admitting: Internal Medicine

## 2023-06-15 VITALS — BP 114/78 | HR 99 | Temp 98.0°F | Ht 62.5 in | Wt 150.0 lb

## 2023-06-15 DIAGNOSIS — R600 Localized edema: Secondary | ICD-10-CM

## 2023-06-15 MED ORDER — POTASSIUM CHLORIDE CRYS ER 10 MEQ PO TBCR: 20.0000 meq | EXTENDED_RELEASE_TABLET | Freq: Two times a day (BID) | ORAL | 5 refills | Status: DC

## 2023-06-15 MED ORDER — TORSEMIDE 40 MG PO TABS: 40.0000 mg | ORAL_TABLET | Freq: Every day | ORAL | 5 refills | Status: DC

## 2023-06-15 NOTE — Patient Instructions (Addendum)
      Blood work was ordered.   Have this done later this week or early next week.      Medications changes include :   stop furosemide and start torsemide 40 mg daily in the morning.  Increase potassium to 2 pills twice a day.     Update me on how the legs are doing.

## 2023-06-15 NOTE — Assessment & Plan Note (Signed)
Acute on chronic 40 mg of Lasix which was her daily dose was not controlling her swelling and the increased dose of 60 mg of lasix for 3 days last week showed no improvement of the edema Has significant edema, but no shortness of breath or other cardiac symptoms Stop furosemide Start torsemide 40 mg daily, potassium 20 mEq twice daily BMP in 1 week They will update me on the swelling

## 2023-06-16 ENCOUNTER — Other Ambulatory Visit: Payer: Self-pay

## 2023-06-16 ENCOUNTER — Encounter: Payer: Self-pay | Admitting: Internal Medicine

## 2023-06-16 MED ORDER — TORSEMIDE 40 MG PO TABS: 40.0000 mg | ORAL_TABLET | Freq: Every day | ORAL | 5 refills | Status: DC

## 2023-06-17 ENCOUNTER — Ambulatory Visit: Payer: Medicare Other | Admitting: Internal Medicine

## 2023-06-17 ENCOUNTER — Encounter: Payer: Self-pay | Admitting: Internal Medicine

## 2023-06-18 NOTE — Telephone Encounter (Signed)
Error

## 2023-06-22 ENCOUNTER — Encounter: Payer: Self-pay | Admitting: Podiatry

## 2023-06-22 ENCOUNTER — Other Ambulatory Visit (INDEPENDENT_AMBULATORY_CARE_PROVIDER_SITE_OTHER): Payer: Medicare Other

## 2023-06-22 ENCOUNTER — Ambulatory Visit: Payer: Medicare Other | Admitting: Podiatry

## 2023-06-22 DIAGNOSIS — B351 Tinea unguium: Secondary | ICD-10-CM

## 2023-06-22 DIAGNOSIS — R600 Localized edema: Secondary | ICD-10-CM | POA: Diagnosis not present

## 2023-06-22 DIAGNOSIS — I739 Peripheral vascular disease, unspecified: Secondary | ICD-10-CM

## 2023-06-22 DIAGNOSIS — L84 Corns and callosities: Secondary | ICD-10-CM

## 2023-06-22 LAB — COMPREHENSIVE METABOLIC PANEL
ALT: 9 U/L (ref 0–35)
AST: 18 U/L (ref 0–37)
Albumin: 3.9 g/dL (ref 3.5–5.2)
Alkaline Phosphatase: 70 U/L (ref 39–117)
BUN: 13 mg/dL (ref 6–23)
CO2: 32 meq/L (ref 19–32)
Calcium: 9.8 mg/dL (ref 8.4–10.5)
Chloride: 97 mEq/L (ref 96–112)
Creatinine, Ser: 0.87 mg/dL (ref 0.40–1.20)
GFR: 56.43 mL/min — ABNORMAL LOW (ref >60.00)
Glucose, Bld: 97 mg/dL (ref 70–99)
Potassium: 4 meq/L (ref 3.5–5.1)
Sodium: 137 meq/L (ref 135–145)
Total Bilirubin: 0.7 mg/dL (ref 0.2–1.2)
Total Protein: 7.5 g/dL (ref 6.0–8.3)

## 2023-06-22 NOTE — Progress Notes (Signed)
Subjective:  Patient ID: Grace Alvarado, female    DOB: January 22, 1925,  MRN: [id number]  Grace Alvarado presents to clinic today for:  Chief Complaint  Patient presents with   routine foot care  . Patient notes nails are thick and elongated, causing pain in shoe gear when ambulating.  Patient has painful callus on the plantar medial aspect of the left bunion.  She uses a walker to assist with ambulation.  Her daughter is with her today.  PCP is Pincus Sanes, MD.  Past Medical History:  Diagnosis Date   Atrial fibrillation (HCC)    Hyperlipidemia    Hypertension    Hypothyroidism    Mitral regurgitation    MVP (mitral valve prolapse)     Allergies  Allergen Reactions   Codeine Other (See Comments)    unknown   Penicillins Other (See Comments)    unknown   Vancomycin Other (See Comments)    unknown   Zithromax [Azithromycin] Other (See Comments)    unknown   Latex Rash    Objective:  There were no vitals filed for this visit.  Grace Alvarado is a pleasant 89 y.o. female in NAD. AAO x 3.  Vascular Examination: Patient has palpable DP pulse, absent PT pulse bilateral.  Delayed capillary refill bilateral toes.  Sparse digital hair bilateral.  Proximal to distal cooling WNL bilateral.    Dermatological Examination: Interspaces are clear with no open lesions noted bilateral.  Skin is shiny and atrophic bilateral.  Nails are 3-34mm thick, with yellowish/brown discoloration, subungual debris and distal onycholysis x10.  There is pain with compression of nails x10.  There are hyperkeratotic lesions noted on the plantar medial aspect of the first metatarsophalangeal joint left foot.    Patient qualifies for at-risk foot care because of PVD.  Assessment/Plan: 1. Dermatophytosis of nail   2. Callus of foot   3. PVD (peripheral vascular disease) (HCC)    Mycotic nails x10 were sharply debrided with sterile nail nippers and power debriding burr to decrease bulk and  length.  Hyperkeratotic lesions x 1 were shaved with #312 blade.  Return in about 3 months (around 09/21/2023) for RFC.   Clerance Lav, DPM, FACFAS [facility]     [address] [place], [place] [address]                Office [phone]  Fax [phone]

## 2023-06-23 ENCOUNTER — Encounter: Payer: Self-pay | Admitting: Internal Medicine

## 2023-06-24 MED ORDER — POTASSIUM CHLORIDE CRYS ER 10 MEQ PO TBCR: EXTENDED_RELEASE_TABLET | ORAL | Status: DC

## 2023-06-24 MED ORDER — TORSEMIDE 40 MG PO TABS: ORAL_TABLET | ORAL | Status: DC

## 2023-07-01 ENCOUNTER — Encounter: Payer: Self-pay | Admitting: Internal Medicine

## 2023-07-01 MED ORDER — POTASSIUM CHLORIDE CRYS ER 10 MEQ PO TBCR: 20.0000 meq | EXTENDED_RELEASE_TABLET | Freq: Every day | ORAL | Status: DC

## 2023-07-01 MED ORDER — TORSEMIDE 40 MG PO TABS: 20.0000 mg | ORAL_TABLET | Freq: Every day | ORAL | Status: DC

## 2023-07-06 ENCOUNTER — Other Ambulatory Visit: Payer: Self-pay | Admitting: Cardiology

## 2023-07-06 DIAGNOSIS — Z7901 Long term (current) use of anticoagulants: Secondary | ICD-10-CM

## 2023-07-06 DIAGNOSIS — I482 Chronic atrial fibrillation, unspecified: Secondary | ICD-10-CM

## 2023-07-06 NOTE — Telephone Encounter (Signed)
Prescription refill request for Xarelto received.  Indication:afib Last office visit:5/24 Weight:68  kg Age:89 Scr:0.87  9/24 CrCl:48.85  ml/min  Prescription refilled

## 2023-07-08 ENCOUNTER — Encounter: Payer: Self-pay | Admitting: Internal Medicine

## 2023-07-09 NOTE — Progress Notes (Signed)
HPI: FU atrial fibrillation. Patient previously resided in [place].  Patient with history of permanent atrial fibrillation.  Previously failed amiodarone and multaq per records. Nuclear study 2012 showed no ischemia with normal LV function. ABIs April 2021 normal. Venous Dopplers March 2023 showed no DVT.  Echocardiogram February 2024 showed normal LV function, mild left ventricular hypertrophy, flattened septum consistent with RV volume overload, severe biatrial enlargement, moderate to severe mitral regurgitation, moderate tricuspid regurgitation. Patient made it clear previously that she did not want valve surgery.  Had GI bleed July 2024.  EGD unrevealing.  Abdominal CT suggested colitis.  Since last seen she denies dyspnea, chest pain, palpitations or syncope.  She was having difficulties with pedal edema which has improved.  She is on higher doses of torsemide.  Current Outpatient Medications  Medication Sig Dispense Refill   acetaminophen (TYLENOL) 325 MG tablet Take 1-2 tablets (325-650 mg total) by mouth every 6 (six) hours as needed for mild pain (pain score 1-3 or temp > 100.5).     B Complex-C (B-COMPLEX WITH VITAMIN C) tablet Take 1 tablet by mouth daily.     diltiazem (CARDIZEM SR) 120 MG 12 hr capsule TAKE 1 CAPSULE BY MOUTH TWICE A DAY 180 capsule 3   ferrous sulfate 325 (65 FE) MG EC tablet Take 1 tablet (325 mg total) by mouth every other day. Follow up with your PCP regarding your iron deficiency outpatient 15 tablet 1   levothyroxine (SYNTHROID) 75 MCG tablet TAKE 1 TABLET BY MOUTH EVERY DAY BEFORE BREAKFAST 90 tablet 1   pantoprazole (PROTONIX) 40 MG tablet Take 1 tablet (40 mg total) by mouth every other day. 45 tablet 3   potassium chloride (KLOR-CON M10) 10 MEQ tablet Take 2 tablets (20 mEq total) by mouth daily.     pravastatin (PRAVACHOL) 10 MG tablet Take 1 tablet (10 mg total) by mouth daily. 90 tablet 3   Torsemide 40 MG TABS Take 20 mg by mouth daily.     XARELTO 15  MG TABS tablet TAKE 1 TABLET BY MOUTH EVERY DAY WITH SUPPER 90 tablet 1   No current facility-administered medications for this visit.     Past Medical History:  Diagnosis Date   Atrial fibrillation (HCC)    Hyperlipidemia    Hypertension    Hypothyroidism    Mitral regurgitation    MVP (mitral valve prolapse)     Past Surgical History:  Procedure Laterality Date   APPENDECTOMY     c section     CESAREAN SECTION     CHOLECYSTECTOMY     COLONOSCOPY WITH PROPOFOL N/A 11/09/2018   Procedure: COLONOSCOPY WITH PROPOFOL;  Surgeon: Sherrilyn Rist, MD;  Location: [facility] ENDOSCOPY;  Service: Gastroenterology;  Laterality: N/A;   ESOPHAGOGASTRODUODENOSCOPY N/A 04/12/2023   Procedure: ESOPHAGOGASTRODUODENOSCOPY (EGD);  Surgeon: Sherrilyn Rist, MD;  Location: [facility] ENDOSCOPY;  Service: Gastroenterology;  Laterality: N/A;   ESOPHAGOGASTRODUODENOSCOPY (EGD) WITH PROPOFOL N/A 11/09/2018   Procedure: ESOPHAGOGASTRODUODENOSCOPY (EGD) WITH PROPOFOL;  Surgeon: Sherrilyn Rist, MD;  Location[facility] ENDOSCOPY;  Service: Gastroenterology;  Laterality: N/A;   HOT HEMOSTASIS N/A 11/09/2018   Procedure: HOT HEMOSTASIS (ARGON PLASMA COAGULATION/BICAP);  Surgeon: Sherrilyn Rist, MD;  Location: [facility] ENDOSCOPY;  Service: Gastroenterology;  Laterality: N/A;   I & D EXTREMITY Left 03/02/2020   Procedure: evacuation of left knee hematoma, possible wound vac placement;  Surgeon: Cammy Copa, MD;  Location: [facility] OR;  Service: Orthopedics;  Laterality: Left;  SUBMUCOSAL INJECTION  11/09/2018   Procedure: SUBMUCOSAL INJECTION;  Surgeon: Sherrilyn Rist, MD;  Location: [facility] ENDOSCOPY;  Service: Gastroenterology;;    Social History   Socioeconomic History   Marital status: Widowed    Spouse name: Not on file   Number of children: 3   Years of education: Not on file   Highest education level: Not on file  Occupational History   Occupation: retired  Tobacco Use   Smoking status: Former   Smokeless tobacco:  Never  Advertising account planner   Vaping status: Never Used  Substance and Sexual Activity   Alcohol use: Not Currently   Drug use: Never   Sexual activity: Not Currently  Other Topics Concern   Not on file  Social History Narrative   Not on file   Social Determinants of Health   Financial Resource Strain: Not on file  Food Insecurity: No Food Insecurity (04/11/2023)   Hunger Vital Sign    Worried About Running Out of Food in the Last Year: Never true    Ran Out of Food in the Last Year: Never true  Transportation Needs: No Transportation Needs (04/11/2023)   PRAPARE - Administrator, Civil Service (Medical): No    Lack of Transportation (Non-Medical): No  Physical Activity: Not on file  Stress: Not on file  Social Connections: Not on file  Intimate Partner Violence: Not At Risk (04/11/2023)   Humiliation, Afraid, Rape, and Kick questionnaire    Fear of Current or Ex-Partner: No    Emotionally Abused: No    Physically Abused: No    Sexually Abused: No    Family History  Problem Relation Age of Onset   Cancer Mother    Dementia Mother     ROS: no fevers or chills, productive cough, hemoptysis, dysphasia, odynophagia, melena, hematochezia, dysuria, hematuria, rash, seizure activity, orthopnea, PND, pedal edema, claudication. Remaining systems are negative.  Physical Exam: Well-developed well-nourished in no acute distress.  Skin is warm and dry.  HEENT is normal.  Neck is supple.  Chest is clear to auscultation with normal expansion.  Cardiovascular exam is irregular, 2/6 systolic murmur apex. Abdominal exam nontender or distended. No masses palpated. Extremities show trace edema. neuro grossly intact   A/P  1 mitral valve prolapse/mitral regurgitation-given patient's age we are treating conservatively.  Patient also is clear that she would not consider surgical intervention.  We are therefore not pursuing follow-up imaging.  2 permanent atrial fibrillation-continue  Cardizem for rate control.  Xarelto has been resumed.  Will watch for recurrent GI bleeding.  Recheck hemoglobin.  3 hypertension-patient's blood pressure is controlled.  Continue present medical regimen.  4 hyperlipidemia-continue statin.  5 lower extremity edema-patient remains euvolemic.  We will continue diuretic at present dose.  Check potassium and renal function.  Olga Millers, MD

## 2023-07-10 ENCOUNTER — Other Ambulatory Visit: Payer: Self-pay | Admitting: Internal Medicine

## 2023-07-10 ENCOUNTER — Encounter: Payer: Self-pay | Admitting: Internal Medicine

## 2023-07-16 ENCOUNTER — Ambulatory Visit: Payer: Medicare Other

## 2023-07-16 DIAGNOSIS — E538 Deficiency of other specified B group vitamins: Secondary | ICD-10-CM | POA: Diagnosis not present

## 2023-07-16 MED ORDER — CYANOCOBALAMIN 1000 MCG/ML IJ SOLN
1000.0000 ug | Freq: Once | INTRAMUSCULAR | Status: AC
Start: 2023-07-16 — End: 2023-07-16
  Administered 2023-07-16: 1000 ug via INTRAMUSCULAR

## 2023-07-16 NOTE — Progress Notes (Signed)
Patient her for B12 injection. Patient tolerated well with no compliations. Patient will get next  monthly B12 at 11/11 with Dr. Lawerance Bach

## 2023-07-23 ENCOUNTER — Ambulatory Visit: Payer: Medicare Other | Attending: Cardiology | Admitting: Cardiology

## 2023-07-23 ENCOUNTER — Encounter: Payer: Self-pay | Admitting: Cardiology

## 2023-07-23 VITALS — BP 100/60 | HR 70 | Ht 65.0 in | Wt 138.0 lb

## 2023-07-23 DIAGNOSIS — I482 Chronic atrial fibrillation, unspecified: Secondary | ICD-10-CM | POA: Diagnosis not present

## 2023-07-23 DIAGNOSIS — E782 Mixed hyperlipidemia: Secondary | ICD-10-CM

## 2023-07-23 DIAGNOSIS — I10 Essential (primary) hypertension: Secondary | ICD-10-CM

## 2023-07-23 DIAGNOSIS — I340 Nonrheumatic mitral (valve) insufficiency: Secondary | ICD-10-CM

## 2023-07-23 DIAGNOSIS — I5032 Chronic diastolic (congestive) heart failure: Secondary | ICD-10-CM

## 2023-07-23 NOTE — Addendum Note (Signed)
Addended by: Myriam Jacobson on: 07/23/2023 08:27 AM   Modules accepted: Orders

## 2023-07-23 NOTE — Patient Instructions (Signed)
Medication Instructions:  NO change  *If you need a refill on your cardiac medications before your next appointment, please call your pharmacy*   Lab Work: BMP CBC If you have labs (blood work) drawn today and your tests are completely normal, you will receive your results only by: MyChart Message (if you have MyChart) OR A paper copy in the mail If you have any lab test that is abnormal or we need to change your treatment, we will call you to review the results.   Testing/Procedures: None    Follow-Up: At [facility], you and your health needs are our priority.  As part of our continuing mission to provide you with exceptional heart care, we have created designated Provider Care Teams.  These Care Teams include your primary Cardiologist (physician) and Advanced Practice Providers (APPs -  Physician Assistants and Nurse Practitioners) who all work together to provide you with the care you need, when you need it.    Your next appointment:   6 month(s)  Provider:   Olga Millers, MD

## 2023-07-24 LAB — BASIC METABOLIC PANEL
BUN/Creatinine Ratio: 14 (ref 12–28)
BUN: 12 mg/dL (ref 10–36)
CO2: 25 mmol/L (ref 20–29)
Calcium: 10 mg/dL (ref 8.7–10.3)
Chloride: 96 mmol/L (ref 96–106)
Creatinine, Ser: 0.85 mg/dL (ref 0.57–1.00)
Glucose: 85 mg/dL (ref 70–99)
Potassium: 4 mmol/L (ref 3.5–5.2)
Sodium: 137 mmol/L (ref 134–144)
eGFR: 63 mL/min/{1.73_m2} (ref >59)

## 2023-07-24 LAB — CBC
Hematocrit: 33.6 % — ABNORMAL LOW (ref 34.0–46.6)
Hemoglobin: 10.2 g/dL — ABNORMAL LOW (ref 11.1–15.9)
MCH: 27.3 pg (ref 26.6–33.0)
MCHC: 30.4 g/dL — ABNORMAL LOW (ref 31.5–35.7)
MCV: 90 fL (ref 79–97)
Platelets: 150 10*3/uL (ref 150–450)
RBC: 3.74 x10E6/uL — ABNORMAL LOW (ref 3.77–5.28)
RDW: 17.2 % — ABNORMAL HIGH (ref 11.7–15.4)
WBC: 4.5 10*3/uL (ref 3.4–10.8)

## 2023-07-27 ENCOUNTER — Other Ambulatory Visit: Payer: Self-pay | Admitting: Cardiology

## 2023-07-27 ENCOUNTER — Other Ambulatory Visit: Payer: Self-pay | Admitting: Internal Medicine

## 2023-08-04 ENCOUNTER — Encounter: Payer: Self-pay | Admitting: Internal Medicine

## 2023-08-07 ENCOUNTER — Ambulatory Visit: Payer: Medicare Other

## 2023-08-07 ENCOUNTER — Ambulatory Visit: Payer: Medicare Other | Admitting: Cardiology

## 2023-08-16 ENCOUNTER — Encounter: Payer: Self-pay | Admitting: Internal Medicine

## 2023-08-16 NOTE — Progress Notes (Unsigned)
      Subjective:    Patient ID: Grace Alvarado, female    DOB: 1925/03/26, 89 y.o.   MRN: [id number]     HPI Grace Alvarado is here for follow up of her chronic medical problems.  No labs  Medications and allergies reviewed with patient and updated if appropriate.  Current Outpatient Medications on File Prior to Visit  Medication Sig Dispense Refill   acetaminophen (TYLENOL) 325 MG tablet Take 1-2 tablets (325-650 mg total) by mouth every 6 (six) hours as needed for mild pain (pain score 1-3 or temp > 100.5).     B Complex-C (B-COMPLEX WITH VITAMIN C) tablet Take 1 tablet by mouth daily.     diltiazem (CARDIZEM SR) 120 MG 12 hr capsule TAKE 1 CAPSULE BY MOUTH TWICE A DAY 180 capsule 3   ferrous sulfate 325 (65 FE) MG EC tablet TAKE 1 TABLET (325 MG TOTAL) BY MOUTH EVERY OTHER DAY. FOLLOW UP WITH YOUR PCP REGARDING YOUR IRON DEFICIENCY OUTPATIENT 15 tablet 1   levothyroxine (SYNTHROID) 75 MCG tablet TAKE 1 TABLET BY MOUTH EVERY DAY BEFORE BREAKFAST 90 tablet 1   pantoprazole (PROTONIX) 40 MG tablet Take 1 tablet (40 mg total) by mouth every other day. 45 tablet 3   potassium chloride (KLOR-CON M10) 10 MEQ tablet Take 2 tablets (20 mEq total) by mouth daily.     pravastatin (PRAVACHOL) 10 MG tablet Take 1 tablet (10 mg total) by mouth daily. 90 tablet 3   Torsemide 40 MG TABS Take 20 mg by mouth daily.     XARELTO 15 MG TABS tablet TAKE 1 TABLET BY MOUTH EVERY DAY WITH SUPPER 90 tablet 1   No current facility-administered medications on file prior to visit.     Review of Systems     Objective:  There were no vitals filed for this visit. BP Readings from Last 3 Encounters:  07/23/23 100/60  06/15/23 114/78  05/27/23 118/60   Wt Readings from Last 3 Encounters:  07/23/23 138 lb (62.6 kg)  06/15/23 150 lb (68 kg)  05/27/23 149 lb 9.6 oz (67.9 kg)   There is no height or weight on file to calculate BMI.    Physical Exam     Lab Results  Component Value Date   WBC 4.5  07/23/2023   HGB 10.2 (L) 07/23/2023   HCT 33.6 (L) 07/23/2023   PLT 150 07/23/2023   GLUCOSE 85 07/23/2023   CHOL 130 10/22/2022   TRIG 57.0 10/22/2022   HDL 64.50 10/22/2022   LDLCALC 54 10/22/2022   ALT 9 06/22/2023   AST 18 06/22/2023   NA 137 07/23/2023   K 4.0 07/23/2023   CL 96 07/23/2023   CREATININE 0.85 07/23/2023   BUN 12 07/23/2023   CO2 25 07/23/2023   TSH 4.01 10/22/2022   INR 2.64 05/21/2018   HGBA1C 5.2 12/18/2020     Assessment & Plan:    See Problem List for Assessment and Plan of chronic medical problems.

## 2023-08-16 NOTE — Patient Instructions (Addendum)
    B12 injection today   Blood work was ordered.   The lab is on the first floor.    Medications changes include :       A referral was ordered and someone will call you to schedule an appointment.     Return in about 6 months (around 02/14/2024) for follow up.

## 2023-08-17 ENCOUNTER — Ambulatory Visit: Payer: Medicare Other | Admitting: Internal Medicine

## 2023-08-17 ENCOUNTER — Encounter: Payer: Self-pay | Admitting: Internal Medicine

## 2023-08-17 VITALS — BP 108/72 | HR 89 | Temp 97.9°F | Ht 65.0 in | Wt 140.0 lb

## 2023-08-17 DIAGNOSIS — I482 Chronic atrial fibrillation, unspecified: Secondary | ICD-10-CM

## 2023-08-17 DIAGNOSIS — I10 Essential (primary) hypertension: Secondary | ICD-10-CM | POA: Diagnosis not present

## 2023-08-17 DIAGNOSIS — L309 Dermatitis, unspecified: Secondary | ICD-10-CM

## 2023-08-17 DIAGNOSIS — E038 Other specified hypothyroidism: Secondary | ICD-10-CM | POA: Diagnosis not present

## 2023-08-17 DIAGNOSIS — E538 Deficiency of other specified B group vitamins: Secondary | ICD-10-CM | POA: Diagnosis not present

## 2023-08-17 DIAGNOSIS — R600 Localized edema: Secondary | ICD-10-CM | POA: Diagnosis not present

## 2023-08-17 DIAGNOSIS — E785 Hyperlipidemia, unspecified: Secondary | ICD-10-CM

## 2023-08-17 DIAGNOSIS — I5032 Chronic diastolic (congestive) heart failure: Secondary | ICD-10-CM

## 2023-08-17 DIAGNOSIS — K219 Gastro-esophageal reflux disease without esophagitis: Secondary | ICD-10-CM

## 2023-08-17 DIAGNOSIS — I2510 Atherosclerotic heart disease of native coronary artery without angina pectoris: Secondary | ICD-10-CM | POA: Diagnosis not present

## 2023-08-17 MED ORDER — PANTOPRAZOLE SODIUM 40 MG PO TBEC: 40.0000 mg | DELAYED_RELEASE_TABLET | Freq: Every day | ORAL | 3 refills | Status: DC

## 2023-08-17 MED ORDER — CYANOCOBALAMIN 1000 MCG/ML IJ SOLN
1000.0000 ug | Freq: Once | INTRAMUSCULAR | Status: AC
  Administered 2023-08-17: 1000 ug via INTRAMUSCULAR

## 2023-08-17 MED ORDER — TRIAMCINOLONE ACETONIDE 0.1 % EX CREA: 1.0000 | TOPICAL_CREAM | Freq: Two times a day (BID) | CUTANEOUS | 0 refills | Status: DC

## 2023-08-17 NOTE — Assessment & Plan Note (Signed)
Chronic Following with cardiology No symptoms consistent with angina New current medications

## 2023-08-17 NOTE — Assessment & Plan Note (Addendum)
Chronic Stable Continue torsemide 20 mg daily? -Taking 1 pill daily and dose on last is a 40 mg pill-daughter will check on dose Continue potassium chloride 20 mill equivalents daily

## 2023-08-17 NOTE — Assessment & Plan Note (Signed)
Chronic Has chronic leg edema, but looks euvolemic Continue torsemide 20 mg daily.

## 2023-08-17 NOTE — Assessment & Plan Note (Signed)
Chronic Blood pressure today well-controlled Continue diltiazem 120 mg twice daily  

## 2023-08-17 NOTE — Assessment & Plan Note (Signed)
Chronic Asymptomatic Heart rate controlled here today Continue Xarelto 15 mg daily, diltiazem 120 mg twice daily

## 2023-08-17 NOTE — Assessment & Plan Note (Signed)
Chronic  Clinically euthyroid Currently taking levothyroxine 75 mcg daily

## 2023-08-17 NOTE — Assessment & Plan Note (Addendum)
History of GI bleeds, on Xarelto Having more frequent GERD-not ideally controlled Continue pantoprazole 40 mg, but start taking a daily

## 2023-08-17 NOTE — Assessment & Plan Note (Signed)
Chronic Lipids have been well controlled Continue pravastatin 10 mg daily

## 2023-08-17 NOTE — Assessment & Plan Note (Signed)
Chronic Continue monthly B12 injections Injection due today-given

## 2023-08-17 NOTE — Assessment & Plan Note (Signed)
New Has a red, slightly thickened, dry patch of skin on her posterior right wrist that looks like eczema She does have a cut in the middle of it, but this does not look like cellulitis Start triamcinolone 0.1% cream twice daily as needed

## 2023-08-18 ENCOUNTER — Other Ambulatory Visit: Payer: Self-pay | Admitting: Internal Medicine

## 2023-09-16 ENCOUNTER — Encounter: Payer: Self-pay | Admitting: Internal Medicine

## 2023-09-16 ENCOUNTER — Ambulatory Visit: Payer: Medicare Other

## 2023-09-16 NOTE — Progress Notes (Unsigned)
    Subjective:    Patient ID: Grace Alvarado, female    DOB: 1924-11-10, 89 y.o.   MRN: [id number]      HPI Dasia is here for No chief complaint on file.    Mouth is burning -    Certain medicines-high blood pressure  Vitamin B12 or folate deficiency  Allergies to foods or dental products  Dry mouth  Mouth infections, such as a yeast infection  Teeth grinding or jaw clenching  Acid reflux  Psychological problems such as stress, anxiety, or depression     Medications and allergies reviewed with patient and updated if appropriate.  Current Outpatient Medications on File Prior to Visit  Medication Sig Dispense Refill   acetaminophen (TYLENOL) 325 MG tablet Take 1-2 tablets (325-650 mg total) by mouth every 6 (six) hours as needed for mild pain (pain score 1-3 or temp > 100.5).     B Complex-C (B-COMPLEX WITH VITAMIN C) tablet Take 1 tablet by mouth daily.     diltiazem (CARDIZEM SR) 120 MG 12 hr capsule TAKE 1 CAPSULE BY MOUTH TWICE A DAY 180 capsule 3   ferrous sulfate 325 (65 FE) MG EC tablet TAKE 1 TABLET (325 MG TOTAL) BY MOUTH EVERY OTHER DAY. FOLLOW UP WITH YOUR PCP REGARDING YOUR IRON DEFICIENCY OUTPATIENT 45 tablet 1   levothyroxine (SYNTHROID) 75 MCG tablet TAKE 1 TABLET BY MOUTH EVERY DAY BEFORE BREAKFAST 90 tablet 1   pantoprazole (PROTONIX) 40 MG tablet Take 1 tablet (40 mg total) by mouth daily. 90 tablet 3   potassium chloride (KLOR-CON M10) 10 MEQ tablet Take 2 tablets (20 mEq total) by mouth daily.     pravastatin (PRAVACHOL) 10 MG tablet Take 1 tablet (10 mg total) by mouth daily. 90 tablet 3   Torsemide 40 MG TABS Take 20 mg by mouth daily. (Patient taking differently: Take 20 mg by mouth as needed.)     triamcinolone cream (KENALOG) 0.1 % Apply 1 Application topically 2 (two) times daily. 30 g 0   XARELTO 15 MG TABS tablet TAKE 1 TABLET BY MOUTH EVERY DAY WITH SUPPER 90 tablet 1   No current facility-administered medications on file prior to visit.     Review of Systems     Objective:  There were no vitals filed for this visit. BP Readings from Last 3 Encounters:  08/17/23 108/72  07/23/23 100/60  06/15/23 114/78   Wt Readings from Last 3 Encounters:  08/17/23 140 lb (63.5 kg)  07/23/23 138 lb (62.6 kg)  06/15/23 150 lb (68 kg)   There is no height or weight on file to calculate BMI.    Physical Exam         Assessment & Plan:    See Problem List for Assessment and Plan of chronic medical problems.

## 2023-09-17 ENCOUNTER — Ambulatory Visit: Payer: Medicare Other | Admitting: Internal Medicine

## 2023-09-17 VITALS — BP 116/80 | HR 75 | Temp 97.9°F | Ht 65.0 in | Wt 140.0 lb

## 2023-09-17 DIAGNOSIS — E538 Deficiency of other specified B group vitamins: Secondary | ICD-10-CM | POA: Diagnosis not present

## 2023-09-17 DIAGNOSIS — M25562 Pain in left knee: Secondary | ICD-10-CM | POA: Diagnosis not present

## 2023-09-17 DIAGNOSIS — R208 Other disturbances of skin sensation: Secondary | ICD-10-CM

## 2023-09-17 MED ORDER — CYANOCOBALAMIN 1000 MCG/ML IJ SOLN
1000.0000 ug | Freq: Once | INTRAMUSCULAR | Status: AC
  Administered 2023-09-17: 1000 ug via INTRAMUSCULAR

## 2023-09-17 NOTE — Assessment & Plan Note (Signed)
Chronic Continue monthly B12 injections Injection due today-given

## 2023-09-17 NOTE — Assessment & Plan Note (Signed)
Acute Started 2-3 weeks ago Typically only feels the burning sensation when she is using toothpaste or mouthwash-denies any new products Not sure why this started all of a sudden Stop using mouthwash Will try a more natural toothpaste without sodium lauryl sulfate Family will let me know if this does not improve

## 2023-09-17 NOTE — Patient Instructions (Addendum)
     Get a toothpaste without sodium lauryl sulfate   Oral-B Densify Decay Control does not have it  Some of Toms toothpastes do not have it   B12 injection given.     Your left knee pain is likely an arthritis flare - ice the knee, take tylenol and you can use topical pain relievers such as bio-freeze.  If it is not getting better we can xray or consider a steroid injection.    Return if symptoms worsen or fail to improve.

## 2023-09-17 NOTE — Assessment & Plan Note (Signed)
Acute Started 2-3 days ago No injury or trauma Left knee is slightly swollen-likely mild effusion and tender to palpation Likely flare of osteoarthritis Take Tylenol, ice, can try heat, topical medication such as Biofreeze or IcyHot Can consider steroid injection by sports medicine if no improvement

## 2023-09-20 ENCOUNTER — Encounter: Payer: Self-pay | Admitting: Internal Medicine

## 2023-09-21 ENCOUNTER — Ambulatory Visit: Payer: Medicare Other | Admitting: Podiatry

## 2023-09-22 ENCOUNTER — Ambulatory Visit (INDEPENDENT_AMBULATORY_CARE_PROVIDER_SITE_OTHER): Payer: Medicare Other

## 2023-09-22 ENCOUNTER — Ambulatory Visit: Payer: Medicare Other | Admitting: Sports Medicine

## 2023-09-22 VITALS — BP 116/80 | HR 78 | Ht 65.0 in | Wt 140.0 lb

## 2023-09-22 DIAGNOSIS — M25562 Pain in left knee: Secondary | ICD-10-CM

## 2023-09-22 DIAGNOSIS — G8929 Other chronic pain: Secondary | ICD-10-CM

## 2023-09-22 DIAGNOSIS — M1712 Unilateral primary osteoarthritis, left knee: Secondary | ICD-10-CM | POA: Diagnosis not present

## 2023-09-22 NOTE — Patient Instructions (Signed)
Thank you for coming in today  You were experiencing a flare of osteoarthritis in the left knee, specifically in the medial compartment.  Increase to Tylenol 500 to 1000 mg 3 times a day.  Maximum of Tylenol 3000 mg total in 1 day  Continue use of lidocaine patches  Recommend using heat over knee as needed  Follow-up as needed if no improvement in 2 to 3 weeks.  We could consider a steroid injection

## 2023-09-22 NOTE — Progress Notes (Signed)
Gr[facility]varado [facility] [address] [place] [address] Phone: [phone]   Assessment and Plan:     1. Primary osteoarthritis of left knee (Primary) 2. Chronic pain of left knee - chronic with exacerbation, initial visit - acute flare of chronic knee pain due to underlying osteoarthritis - xray obtained in clinic. My interpretation: no fracture or dislocation.  Severe degenerative changes primarily medial and patellofemoral compartments - Offered intra-articular CSI which was declined at today's visit due to age and comorbidities.  Could consider a future visit if pain continues - Start Tylenol 500 to 1000 mg tablets 2-3 times a day for day-to-day pain relief - Continue lidocaine patch, heating pad use  Patient accompanied by her daughter who helps provide HPI.  Pertinent previous records reviewed include none    Follow Up: As needed if no improvement in 2 to 3 weeks.  Could consider intra-articular CSI   Subjective:   I, Grace Alvarado, am serving as a Neurosurgeon for Doctor Richardean Sale  Chief Complaint: L knee pain   HPI:   09/22/23 Patient is a 89 year old female with knee pain. Patient states left knee pain got about a week. No MOI or can't remember. She is using salon pas patches do not help much. Anterior knee pain. She does endorse swelling. Hx of neuropathy. Hx of knee surgery about 3 years ago. Tylenol 325 mg for the pain that is not helping   Relevant Historical Information: CAD, HTN, history lower GI bleed, chronic Afib on xarelto, mitral regurg  Additional pertinent review of systems negative.   Current Outpatient Medications:    acetaminophen (TYLENOL) 325 MG tablet, Take 1-2 tablets (325-650 mg total) by mouth every 6 (six) hours as needed for mild pain (pain score 1-3 or temp > 100.5)., Disp:  , Rfl:    B Complex-C (B-COMPLEX WITH VITAMIN C) tablet, Take 1 tablet by mouth daily., Disp:  , Rfl:    diltiazem  (CARDIZEM SR) 120 MG 12 hr capsule, TAKE 1 CAPSULE BY MOUTH TWICE A DAY, Disp: 180 capsule, Rfl: 3   ferrous sulfate 325 (65 FE) MG EC tablet, TAKE 1 TABLET (325 MG TOTAL) BY MOUTH EVERY OTHER DAY. FOLLOW UP WITH YOUR PCP REGARDING YOUR IRON DEFICIENCY OUTPATIENT, Disp: 45 tablet, Rfl: 1   levothyroxine (SYNTHROID) 75 MCG tablet, TAKE 1 TABLET BY MOUTH EVERY DAY BEFORE BREAKFAST, Disp: 90 tablet, Rfl: 1   pantoprazole (PROTONIX) 40 MG tablet, Take 1 tablet (40 mg total) by mouth daily., Disp: 90 tablet, Rfl: 3   potassium chloride (KLOR-CON M10) 10 MEQ tablet, Take 2 tablets (20 mEq total) by mouth daily., Disp: , Rfl:    pravastatin (PRAVACHOL) 10 MG tablet, Take 1 tablet (10 mg total) by mouth daily., Disp: 90 tablet, Rfl: 3   Torsemide 40 MG TABS, Take 20 mg by mouth daily. (Patient taking differently: Take 20 mg by mouth as needed.), Disp: , Rfl:    triamcinolone cream (KENALOG) 0.1 %, Apply 1 Application topically 2 (two) times daily., Disp: 30 g, Rfl: 0   XARELTO 15 MG TABS tablet, TAKE 1 TABLET BY MOUTH EVERY DAY WITH SUPPER, Disp: 90 tablet, Rfl: 1   Objective:     Vitals:   09/22/23 0915  BP: 116/80  Pulse: 78  SpO2: 99%  Weight: 140 lb (63.5 kg)  Height: 5\' 5"  (1.651 m)      Body mass index is 23.3 kg/m.    Physical Exam:  General:  awake, alert oriented, no acute distress nontoxic Skin: no suspicious lesions or rashes Neuro:sensation intact and strength 5/5 with no deficits, no atrophy, normal muscle tone Psych: No signs of anxiety, depression or other mood disorder  left Knee: Moderate swelling No deformity Positive fluid wave, joint milking ROM Flex 95, Ext 10 TTP medial femoral condyle, medial joint line NTTP over the quad tendon,   lat fem condyle, patella, plica, patella tendon, tibial tuberostiy, fibular head, posterior fossa, pes anserine bursa, gerdy's tubercle,   lateral jt line Neg anterior and posterior drawer  Gait antalgic, using a rollator for  support    Electronically signed by:  Grace Alvarado [facility] 10:10 AM 09/22/23

## 2023-09-25 ENCOUNTER — Ambulatory Visit: Payer: Medicare Other | Admitting: Orthopaedic Surgery

## 2023-09-25 ENCOUNTER — Encounter: Payer: Self-pay | Admitting: Orthopaedic Surgery

## 2023-09-25 DIAGNOSIS — M1712 Unilateral primary osteoarthritis, left knee: Secondary | ICD-10-CM

## 2023-09-25 MED ORDER — LIDOCAINE HCL 1 % IJ SOLN
2.0000 mL | INTRAMUSCULAR | Status: AC | PRN
  Administered 2023-09-25: 2 mL

## 2023-09-25 MED ORDER — METHYLPREDNISOLONE ACETATE 40 MG/ML IJ SUSP
40.0000 mg | INTRAMUSCULAR | Status: AC | PRN
  Administered 2023-09-25: 40 mg via INTRA_ARTICULAR

## 2023-09-25 MED ORDER — BUPIVACAINE HCL 0.5 % IJ SOLN
2.0000 mL | INTRAMUSCULAR | Status: AC | PRN
  Administered 2023-09-25: 2 mL via INTRA_ARTICULAR

## 2023-09-25 NOTE — Progress Notes (Signed)
Office Visit Note   Patient: Grace Alvarado           Date of Birth: 02/12/1925           MRN: [id number] Visit Date: 09/25/2023              Requested by: Pincus Sanes, MD [address] [place],  [place] [address] PCP: Pincus Sanes, MD   Assessment & Plan: Visit Diagnoses:  1. Primary osteoarthritis of left knee     Plan: Impression is 89 year old female with left knee osteoarthritis flare.  Treatment options discussed and we agreed to try a cortisone injection today which she tolerated well.  Follow-up as needed  Follow-Up Instructions: No follow-ups on file.   Orders:  No orders of the defined types were placed in this encounter.  No orders of the defined types were placed in this encounter.     Procedures: Large Joint Inj: L knee on 09/25/2023 4:04 PM Details: 22 G needle Medications: 2 mL bupivacaine 0.5 %; 2 mL lidocaine 1 %; 40 mg methylPREDNISolone acetate 40 MG/ML Outcome: tolerated well, no immediate complications Patient was prepped and draped in the usual sterile fashion.       Clinical Data: No additional findings.   Subjective: Chief Complaint  Patient presents with   Left Knee - Pain    HPI Talayeh is a 89 year old female here for evaluation of left knee pain for about a week and a half.  Saw Dr. Jean Rosenthal at sports medicine clinic and was prescribed Tylenol and lidocaine patch.  Patient is on Xarelto for A-fib.  She is having trouble walking.  Uses a rollator. Review of Systems  Constitutional: Negative.   HENT: Negative.    Eyes: Negative.   Respiratory: Negative.    Cardiovascular: Negative.   Endocrine: Negative.   Musculoskeletal: Negative.   Neurological: Negative.   Hematological: Negative.   Psychiatric/Behavioral: Negative.    All other systems reviewed and are negative.    Objective: Vital Signs: LMP  (LMP Unknown)   Physical Exam Vitals and nursing note reviewed.  Constitutional:      Appearance: She is  well-developed.  HENT:     Head: Atraumatic.     Nose: Nose normal.  Eyes:     Extraocular Movements: Extraocular movements intact.  Cardiovascular:     Pulses: Normal pulses.  Pulmonary:     Effort: Pulmonary effort is normal.  Abdominal:     Palpations: Abdomen is soft.  Musculoskeletal:     Cervical back: Neck supple.  Skin:    General: Skin is warm.     Capillary Refill: Capillary refill takes less than 2 seconds.  Neurological:     Mental Status: She is alert. Mental status is at baseline.  Psychiatric:        Behavior: Behavior normal.        Thought Content: Thought content normal.        Judgment: Judgment normal.     Ortho Exam Exam of the left knee shows no joint effusion.  No joint line tenderness.  Collaterals and cruciates are stable.  Normal range of motion. Specialty Comments:  No specialty comments available.  Imaging: No results found.   PMFS History: Patient Active Problem List   Diagnosis Date Noted   Acute pain of left knee 09/17/2023   Burning sensation of mouth 09/17/2023   Eczema 08/17/2023   Melena 04/12/2023   Chronic iron deficiency anemia 04/12/2023   Benign mole 12/23/2022  Dry eyes, bilateral 07/07/2022   Bilateral leg edema 12/17/2021   Memory changes 10/29/2021   COVID 10/28/2021   Left-sided nosebleed 10/14/2021   Fatigue 08/14/2021   Urinary frequency 08/14/2021   Lumbar radiculopathy 06/25/2021   Painful rib 09/27/2020   LUQ pain 09/17/2020   Pilonidal cyst 09/17/2020   Allergic rhinitis 07/03/2020   Aortic atherosclerosis (HCC) 06/13/2020   DNR (do not resuscitate) 03/07/2020   Prepatellar bursitis, left knee 03/02/2020   Neuropathy 01/24/2020   Chronic back pain 01/24/2020   Compression fracture of T11 vertebra (HCC) 01/23/2020   Leg pain 01/03/2020   Chronic anticoagulation 01/03/2020   Compression fracture of L1 lumbar vertebra (HCC) 07/27/2019   Low back pain 07/20/2019   Hyperglycemia 05/01/2019   Constipation  11/16/2018   AVM (arteriovenous malformation) of colon    Lower GI bleed 11/06/2018   Rectal bleeding 11/05/2018   Iron deficiency anemia 11/02/2018   B12 deficiency 05/27/2018   GERD (gastroesophageal reflux disease) 05/20/2018   Pulmonary hypertension, primary (HCC) 05/20/2018   Dyslipidemia 05/20/2018   Chronic diastolic heart failure (HCC) 05/20/2018   Tricuspid regurgitation 05/17/2018   Mitral regurgitation, severe 2023 05/17/2018   Chronic a-fib 05/17/2018   Essential hypertension 05/17/2018   Hypothyroid 05/17/2018   CAD (coronary artery disease) 05/17/2018   Carotid atherosclerosis, bilateral 05/17/2018   Past Medical History:  Diagnosis Date   Atrial fibrillation (HCC)    Hyperlipidemia    Hypertension    Hypothyroidism    Mitral regurgitation    MVP (mitral valve prolapse)     Family History  Problem Relation Age of Onset   Cancer Mother    Dementia Mother     Past Surgical History:  Procedure Laterality Date   APPENDECTOMY     c section     CESAREAN SECTION     CHOLECYSTECTOMY     COLONOSCOPY WITH PROPOFOL N/A 11/09/2018   Procedure: COLONOSCOPY WITH PROPOFOL;  Surgeon: Sherrilyn Rist, MD;  Location[facility] ENDOSCOPY;  Service: Gastroenterology;  Laterality: N/A;   ESOPHAGOGASTRODUODENOSCOPY N/A 04/12/2023   Procedure: ESOPHAGOGASTRODUODENOSCOPY (EGD);  Surgeon: Sherrilyn Rist, MD;  Location: [facility] ENDOSCOPY;  Service: Gastroenterology;  Laterality: N/A;   ESOPHAGOGASTRODUODENOSCOPY (EGD) WITH PROPOFOL N/A 11/09/2018   Procedure: ESOPHAGOGASTRODUODENOSCOPY (EGD) WITH PROPOFOL;  Surgeon: Sherrilyn Rist, MD;  Location: [facility] ENDOSCOPY;  Service: Gastroenterology;  Laterality: N/A;   HOT HEMOSTASIS N/A 11/09/2018   Procedure: HOT HEMOSTASIS (ARGON PLASMA COAGULATION/BICAP);  Surgeon: Sherrilyn Rist, MD;  Location: [facility] ENDOSCOPY;  Service: Gastroenterology;  Laterality: N/A;   I & D EXTREMITY Left 03/02/2020   Procedure: evacuation of left knee hematoma, possible  wound vac placement;  Surgeon: Cammy Copa, MD;  Location: [facility] OR;  Service: Orthopedics;  Laterality: Left;   SUBMUCOSAL INJECTION  11/09/2018   Procedure: SUBMUCOSAL INJECTION;  Surgeon: Sherrilyn Rist, MD;  Location: [facility] ENDOSCOPY;  Service: Gastroenterology;;   Social History   Occupational History   Occupation: retired  Tobacco Use   Smoking status: Former   Smokeless tobacco: Never  Vaping Use   Vaping status: Never Used  Substance and Sexual Activity   Alcohol use: Not Currently   Drug use: Never   Sexual activity: Not Currently

## 2023-10-06 ENCOUNTER — Other Ambulatory Visit: Payer: Self-pay | Admitting: Cardiology

## 2023-10-08 ENCOUNTER — Ambulatory Visit: Payer: Medicare Other | Admitting: Sports Medicine

## 2023-10-08 ENCOUNTER — Encounter: Payer: Self-pay | Admitting: Internal Medicine

## 2023-10-08 DIAGNOSIS — D509 Iron deficiency anemia, unspecified: Secondary | ICD-10-CM

## 2023-10-13 ENCOUNTER — Encounter: Payer: Self-pay | Admitting: Internal Medicine

## 2023-10-19 ENCOUNTER — Ambulatory Visit: Payer: Medicare Other

## 2023-10-21 ENCOUNTER — Ambulatory Visit (INDEPENDENT_AMBULATORY_CARE_PROVIDER_SITE_OTHER): Payer: Medicare Other

## 2023-10-21 ENCOUNTER — Other Ambulatory Visit (INDEPENDENT_AMBULATORY_CARE_PROVIDER_SITE_OTHER): Payer: Medicare Other

## 2023-10-21 ENCOUNTER — Other Ambulatory Visit: Payer: Self-pay | Admitting: Internal Medicine

## 2023-10-21 DIAGNOSIS — E538 Deficiency of other specified B group vitamins: Secondary | ICD-10-CM | POA: Diagnosis not present

## 2023-10-21 DIAGNOSIS — D509 Iron deficiency anemia, unspecified: Secondary | ICD-10-CM

## 2023-10-21 LAB — CBC WITH DIFFERENTIAL/PLATELET
Basophils Absolute: 0 10*3/uL (ref 0.0–0.1)
Basophils Relative: 0.4 % (ref 0.0–3.0)
Eosinophils Absolute: 0 10*3/uL (ref 0.0–0.7)
Eosinophils Relative: 0.8 % (ref 0.0–5.0)
HCT: 39.5 % (ref 36.0–46.0)
Hemoglobin: 13.1 g/dL (ref 12.0–15.0)
Lymphocytes Relative: 29.4 % (ref 12.0–46.0)
Lymphs Abs: 1.5 10*3/uL (ref 0.7–4.0)
MCHC: 33.1 g/dL (ref 30.0–36.0)
MCV: 96.8 fL (ref 78.0–100.0)
Monocytes Absolute: 0.5 10*3/uL (ref 0.1–1.0)
Monocytes Relative: 10.9 % (ref 3.0–12.0)
Neutro Abs: 2.9 10*3/uL (ref 1.4–7.7)
Neutrophils Relative %: 58.5 % (ref 43.0–77.0)
Platelets: 160 10*3/uL (ref 150.0–400.0)
RBC: 4.08 Mil/uL (ref 3.87–5.11)
RDW: 17.9 % — ABNORMAL HIGH (ref 11.5–15.5)
WBC: 5 10*3/uL (ref 4.0–10.5)

## 2023-10-21 LAB — IBC PANEL
Iron: 82 ug/dL (ref 42–145)
Saturation Ratios: 20.6 % (ref 20.0–50.0)
TIBC: 397.6 ug/dL (ref 250.0–450.0)
Transferrin: 284 mg/dL (ref 212.0–360.0)

## 2023-10-21 LAB — FERRITIN: Ferritin: 27.7 ng/mL (ref 10.0–291.0)

## 2023-10-21 MED ORDER — CYANOCOBALAMIN 1000 MCG/ML IJ SOLN
1000.0000 ug | Freq: Once | INTRAMUSCULAR | Status: AC
  Administered 2023-10-21: 1000 ug via INTRAMUSCULAR

## 2023-10-21 NOTE — Progress Notes (Signed)
Pt was given B12 injection with no complications.

## 2023-10-26 ENCOUNTER — Encounter: Payer: Self-pay | Admitting: Podiatry

## 2023-10-26 ENCOUNTER — Ambulatory Visit: Payer: Medicare Other | Admitting: Podiatry

## 2023-10-26 ENCOUNTER — Ambulatory Visit: Payer: Medicare Other | Admitting: Podiatrist

## 2023-10-26 DIAGNOSIS — M79674 Pain in right toe(s): Secondary | ICD-10-CM

## 2023-10-26 DIAGNOSIS — I739 Peripheral vascular disease, unspecified: Secondary | ICD-10-CM

## 2023-10-26 DIAGNOSIS — B351 Tinea unguium: Secondary | ICD-10-CM

## 2023-10-26 DIAGNOSIS — M79675 Pain in left toe(s): Secondary | ICD-10-CM | POA: Diagnosis not present

## 2023-10-26 NOTE — Progress Notes (Signed)
  Subjective:  Patient ID: Grace Alvarado, female    DOB: [birth year],   MRN: [id number]  No chief complaint on file.   89 y.o. female presents for concern of thickened elongated and painful nails that are difficult to trim. Requesting to have them trimmed today. She has a history of PVD and on chronic anticoagulation.   PCP:  Pincus Sanes, MD    . Denies any other pedal complaints. Denies n/v/f/c.   Past Medical History:  Diagnosis Date   Atrial fibrillation (HCC)    Hyperlipidemia    Hypertension    Hypothyroidism    Mitral regurgitation    MVP (mitral valve prolapse)     Objective:  Physical Exam: Vascular: DP/PT pulses 2/4 bilateral. CFT <3 seconds. Normal hair growth on digits. No edema.  Skin. No lacerations or abrasions bilateral feet. Nails 1-5 bilateral are thickened elongated and dystrophic.  Musculoskeletal: MMT 5/5 bilateral lower extremities in DF, PF, Inversion and Eversion. Deceased ROM in DF of ankle joint.  Neurological: Sensation intact to light touch.   Assessment:   1. Dermatophytosis of nail   2. Callus of foot   3. PVD (peripheral vascular disease) (HCC)      Plan:  Patient was evaluated and treated and all questions answered. -Mechanically debrided all nails 1-5 bilateral using sterile nail nipper and filed with dremel without incident  -Answered all patient questions -Patient to return  in 3 months for at risk foot care -Patient advised to call the office if any problems or questions arise in the meantime.   Louann Sjogren, DPM

## 2023-11-06 ENCOUNTER — Encounter: Payer: Self-pay | Admitting: Cardiology

## 2023-11-06 ENCOUNTER — Encounter: Payer: Self-pay | Admitting: Internal Medicine

## 2023-11-06 ENCOUNTER — Other Ambulatory Visit: Payer: Self-pay

## 2023-11-06 ENCOUNTER — Other Ambulatory Visit: Payer: Self-pay | Admitting: Cardiology

## 2023-11-06 MED ORDER — DILTIAZEM HCL ER 120 MG PO CP12: 120.0000 mg | ORAL_CAPSULE | Freq: Two times a day (BID) | ORAL | 3 refills | Status: DC

## 2023-11-19 ENCOUNTER — Other Ambulatory Visit: Payer: Self-pay | Admitting: Internal Medicine

## 2023-11-19 ENCOUNTER — Ambulatory Visit: Payer: Medicare Other | Admitting: Internal Medicine

## 2023-11-19 ENCOUNTER — Encounter: Payer: Self-pay | Admitting: Internal Medicine

## 2023-11-19 ENCOUNTER — Ambulatory Visit: Payer: Self-pay | Admitting: Internal Medicine

## 2023-11-19 VITALS — BP 120/64 | HR 69 | Temp 98.9°F | Ht 65.0 in | Wt 136.0 lb

## 2023-11-19 DIAGNOSIS — E538 Deficiency of other specified B group vitamins: Secondary | ICD-10-CM | POA: Diagnosis not present

## 2023-11-19 DIAGNOSIS — M79605 Pain in left leg: Secondary | ICD-10-CM

## 2023-11-19 DIAGNOSIS — R739 Hyperglycemia, unspecified: Secondary | ICD-10-CM

## 2023-11-19 DIAGNOSIS — I10 Essential (primary) hypertension: Secondary | ICD-10-CM

## 2023-11-19 MED ORDER — CYANOCOBALAMIN 1000 MCG/ML IJ SOLN
1000.0000 ug | Freq: Once | INTRAMUSCULAR | Status: AC
Start: 2023-11-19 — End: 2023-11-19
  Administered 2023-11-19: 1000 ug via INTRAMUSCULAR

## 2023-11-19 NOTE — Telephone Encounter (Signed)
Patient scheduled for a visit today.

## 2023-11-19 NOTE — Telephone Encounter (Signed)
  Chief Complaint: leg pain Symptoms: left sided calf pain, hard spot to touch on legs Frequency: 1 week Pertinent Negatives: Patient denies fever, sob, chest pain Disposition: [] ED /[] Urgent Care (no appt availability in office) / [x] Appointment(In office/virtual)/ []  [facility]/ [] Home Care/ [] Refused Recommended Disposition /[] [facility]/ []  Follow-up with PCP Additional Notes: Patients daughter reports patient has been experiencing pain on the left side of her calf that comes and goes. Currently pain is moderate. Daughter reports there is a spot on the left side of her calf that is hard to touch and looked bruised. Patient is on blood thinners. Per protocol, appt scheduled today 2/13. Patient advised to call back with worsening symptoms. Verbalized understanding.      Copied from CRM [id number]. Topic: Clinical - Red Word Triage >> Nov 19, 2023  8:46 AM Isabell A wrote: Red Word that prompted transfer to Nurse Triage: Daughter Angelique Blonder states patient is experiencing pain on the left side of her calf - looks like a bruise. Level 10 pain. Reason for Disposition  [1] Thigh or calf pain AND [2] only 1 side AND [3] present > 1 hour (Exception: Chronic unchanged pain.)  Answer Assessment - Initial Assessment Questions 1. ONSET: "When did the pain start?"      1 week 2. LOCATION: "Where is the pain located?"      Left sided calf pain 3. PAIN: "How bad is the pain?"    (Scale 1-10; or mild, moderate, severe)   -  MILD (1-3): doesn't interfere with normal activities    -  MODERATE (4-7): interferes with normal activities (e.g., work or school) or awakens from sleep, limping    -  SEVERE (8-10): excruciating pain, unable to do any normal activities, unable to walk     On and off, moderate 4. WORK OR EXERCISE: "Has there been any recent work or exercise that involved this part of the body?"      none 5. CAUSE: "What do you think is causing the leg pain?"     Bruise maybe,  on blood thinners 6. OTHER SYMPTOMS: "Do you have any other symptoms?" (e.g., chest pain, back pain, breathing difficulty, swelling, rash, fever, numbness, weakness)    Spot hard to touch,  Protocols used: Leg Pain-A-AH

## 2023-11-19 NOTE — Patient Instructions (Signed)
Ok to use the tylenol and/or SalonPaz for the left leg pain  You had the B12 shot today  Please continue all other medications as before, and refills have been done if requested.  Please have the pharmacy call with any other refills you may need.  Please keep your appointments with your specialists as you may have planned

## 2023-11-19 NOTE — Progress Notes (Signed)
Patient ID: Grace Alvarado, female   DOB: Jan 02, 1925, 89 y.o.   MRN: [id number]        Chief Complaint: follow up left leg pain       HPI:  Grace Alvarado is a 89 y.o. female here with c/o lateral left leg pain with soreness to touch and walk for several days,may have struck the walker but pt cannot recall.  No skin changes, no clear neuritic or claudication type pain though she does have chronic LBP as well.  Pt denies chest pain, increased sob or doe, wheezing, orthopnea, PND, increased LE swelling, palpitations, dizziness or syncope.   Pt denies polydipsia, polyuria, or new focal neuro s/s.   Due for b12 shot        Wt Readings from Last 3 Encounters:  11/19/23 136 lb (61.7 kg)  09/22/23 140 lb (63.5 kg)  09/17/23 140 lb (63.5 kg)   BP Readings from Last 3 Encounters:  11/19/23 120/64  09/22/23 116/80  09/17/23 116/80         Past Medical History:  Diagnosis Date   Atrial fibrillation (HCC)    Hyperlipidemia    Hypertension    Hypothyroidism    Mitral regurgitation    MVP (mitral valve prolapse)    Past Surgical History:  Procedure Laterality Date   APPENDECTOMY     c section     CESAREAN SECTION     CHOLECYSTECTOMY     COLONOSCOPY WITH PROPOFOL N/A 11/09/2018   Procedure: COLONOSCOPY WITH PROPOFOL;  Surgeon: Sherrilyn Rist, MD;  Location: [facility] ENDOSCOPY;  Service: Gastroenterology;  Laterality: N/A;   ESOPHAGOGASTRODUODENOSCOPY N/A 04/12/2023   Procedure: ESOPHAGOGASTRODUODENOSCOPY (EGD);  Surgeon: Sherrilyn Rist, MD;  Location: [facility] ENDOSCOPY;  Service: Gastroenterology;  Laterality: N/A;   ESOPHAGOGASTRODUODENOSCOPY (EGD) WITH PROPOFOL N/A 11/09/2018   Procedure: ESOPHAGOGASTRODUODENOSCOPY (EGD) WITH PROPOFOL;  Surgeon: Sherrilyn Rist, MD;  Location: [facility] ENDOSCOPY;  Service: Gastroenterology;  Laterality: N/A;   HOT HEMOSTASIS N/A 11/09/2018   Procedure: HOT HEMOSTASIS (ARGON PLASMA COAGULATION/BICAP);  Surgeon: Sherrilyn Rist, MD;  Location: [facility] ENDOSCOPY;  Service:  Gastroenterology;  Laterality: N/A;   I & D EXTREMITY Left 03/02/2020   Procedure: evacuation of left knee hematoma, possible wound vac placement;  Surgeon: Cammy Copa, MD;  Location: [facility] OR;  Service: Orthopedics;  Laterality: Left;   SUBMUCOSAL INJECTION  11/09/2018   Procedure: SUBMUCOSAL INJECTION;  Surgeon: Sherrilyn Rist, MD;  Location: [facility] ENDOSCOPY;  Service: Gastroenterology;;    reports that she has quit smoking. She has never used smokeless tobacco. She reports that she does not currently use alcohol. She reports that she does not use drugs. family history includes Cancer in her mother; Dementia in her mother. Allergies  Allergen Reactions   Codeine Other (See Comments)    unknown   Penicillins Other (See Comments)    unknown   Vancomycin Other (See Comments)    unknown   Zithromax [Azithromycin] Other (See Comments)    unknown   Latex Rash   Current Outpatient Medications on File Prior to Visit  Medication Sig Dispense Refill   acetaminophen (TYLENOL) 325 MG tablet Take 1-2 tablets (325-650 mg total) by mouth every 6 (six) hours as needed for mild pain (pain score 1-3 or temp > 100.5).     B Complex-C (B-COMPLEX WITH VITAMIN C) tablet Take 1 tablet by mouth daily.     diltiazem (CARDIZEM SR) 120 MG 12 hr capsule Take 1 capsule (  120 mg total) by mouth 2 (two) times daily. 180 capsule 3   ferrous sulfate 325 (65 FE) MG EC tablet TAKE 1 TABLET (325 MG TOTAL) BY MOUTH EVERY OTHER DAY. FOLLOW UP WITH YOUR PCP REGARDING YOUR IRON DEFICIENCY OUTPATIENT 45 tablet 1   KLOR-CON M10 10 MEQ tablet TAKE 1 TO 2 TABLETS BY MOUTH EVERY DAY 180 tablet 1   levothyroxine (SYNTHROID) 75 MCG tablet TAKE 1 TABLET BY MOUTH EVERY DAY BEFORE BREAKFAST 90 tablet 1   pantoprazole (PROTONIX) 40 MG tablet Take 1 tablet (40 mg total) by mouth daily. 90 tablet 3   pravastatin (PRAVACHOL) 10 MG tablet TAKE 1 TABLET BY MOUTH EVERY DAY 90 tablet 3   torsemide (DEMADEX) 20 MG tablet TAKE 2 TABS (40  MG) BY MOUTH DAILY. 180 tablet 1   Torsemide 40 MG TABS Take 20 mg by mouth daily. (Patient taking differently: Take 20 mg by mouth as needed.)     triamcinolone cream (KENALOG) 0.1 % Apply 1 Application topically 2 (two) times daily. 30 g 0   XARELTO 15 MG TABS tablet TAKE 1 TABLET BY MOUTH EVERY DAY WITH SUPPER 90 tablet 1   No current facility-administered medications on file prior to visit.        ROS:  All others reviewed and negative.  Objective        PE:  BP 120/64 (BP Location: Right Arm, Patient Position: Sitting, Cuff Size: Normal)   Pulse 69   Temp 98.9 F (37.2 C) (Oral)   Ht 5\' 5"  (1.651 m)   Wt 136 lb (61.7 kg)   LMP  (LMP Unknown)   SpO2 98%   BMI 22.63 kg/m                 Constitutional: Pt appears in NAD               HENT: Head: NCAT.                Right Ear: External ear normal.                 Left Ear: External ear normal.                Eyes: . Pupils are equal, round, and reactive to light. Conjunctivae and EOM are normal               Nose: without d/c or deformity               Neck: Neck supple. Gross normal ROM               Cardiovascular: Normal rate and regular rhythm.                 Pulmonary/Chest: Effort normal and breath sounds without rales or wheezing.                Abd:  Soft, NT, ND, + BS, no organomegaly               Neurological: Pt is alert. At baseline orientation, motor grossly intact               Skin: Skin is warm. No rashes, no other new lesions, LE edema - bilateral trace pedal only, left lateral leg with sore tender area to lateral leg approx 5 cm above lateral malloelus but no skin change, swelling and o/w neurovasc intact  Psychiatric: Pt behavior is normal without agitation   Micro: none  Cardiac tracings I have personally interpreted today:  none  Pertinent Radiological findings (summarize): none   Lab Results  Component Value Date   WBC 5.0 10/21/2023   HGB 13.1 10/21/2023   HCT 39.5 10/21/2023    PLT 160.0 10/21/2023   GLUCOSE 85 07/23/2023   CHOL 130 10/22/2022   TRIG 57.0 10/22/2022   HDL 64.50 10/22/2022   LDLCALC 54 10/22/2022   ALT 9 06/22/2023   AST 18 06/22/2023   NA 137 07/23/2023   K 4.0 07/23/2023   CL 96 07/23/2023   CREATININE 0.85 07/23/2023   BUN 12 07/23/2023   CO2 25 07/23/2023   TSH 4.01 10/22/2022   INR 2.64 05/21/2018   HGBA1C 5.2 12/18/2020   Assessment/Plan:  Grace Alvarado is a 89 y.o. White or Caucasian [1] female with  has a past medical history of Atrial fibrillation (HCC), Hyperlipidemia, Hypertension, Hypothyroidism, Mitral regurgitation, and MVP (mitral valve prolapse).  B12 deficiency For B12 1000 mcg IM as is due  Leg pain Tender soreness area to distal left leg above the ankle with o/w benign exam - this seems c/w msk strain vs minor trauma with walker, ok for topical salonpaz and/or voltaren gel prn, tylenol prn,  to f/u any worsening symptoms or concerns  Hyperglycemia Lab Results  Component Value Date   HGBA1C 5.2 12/18/2020   Stable, pt to continue current medical treatment  - diet,wt control   Essential hypertension BP Readings from Last 3 Encounters:  11/19/23 120/64  09/22/23 116/80  09/17/23 116/80   Stable, pt to continue medical treatment card SR 120 bid  Followup: Return if symptoms worsen or fail to improve.  Oliver Barre, MD 11/21/2023 7:31 PM [facility]

## 2023-11-20 ENCOUNTER — Ambulatory Visit: Payer: Medicare Other | Admitting: Family Medicine

## 2023-11-21 ENCOUNTER — Encounter: Payer: Self-pay | Admitting: Internal Medicine

## 2023-11-21 NOTE — Assessment & Plan Note (Signed)
Tender soreness area to distal left leg above the ankle with o/w benign exam - this seems c/w msk strain vs minor trauma with walker, ok for topical salonpaz and/or voltaren gel prn, tylenol prn,  to f/u any worsening symptoms or concerns

## 2023-11-21 NOTE — Assessment & Plan Note (Signed)
Lab Results  Component Value Date   HGBA1C 5.2 12/18/2020   Stable, pt to continue current medical treatment  - diet,wt control

## 2023-11-21 NOTE — Assessment & Plan Note (Signed)
BP Readings from Last 3 Encounters:  11/19/23 120/64  09/22/23 116/80  09/17/23 116/80   Stable, pt to continue medical treatment card SR 120 bid

## 2023-11-21 NOTE — Assessment & Plan Note (Signed)
For B12 1000 mcg IM as is due

## 2023-11-23 ENCOUNTER — Ambulatory Visit: Payer: Medicare Other

## 2023-12-10 ENCOUNTER — Encounter: Payer: Self-pay | Admitting: Internal Medicine

## 2023-12-14 ENCOUNTER — Ambulatory Visit

## 2023-12-16 ENCOUNTER — Other Ambulatory Visit: Payer: Self-pay | Admitting: Cardiology

## 2023-12-16 DIAGNOSIS — Z7901 Long term (current) use of anticoagulants: Secondary | ICD-10-CM

## 2023-12-16 DIAGNOSIS — I482 Chronic atrial fibrillation, unspecified: Secondary | ICD-10-CM

## 2023-12-16 NOTE — Telephone Encounter (Signed)
 Prescription refill request for Xarelto received.  Indication:afib Last office visit:10/24 Weight:61.7  kg Age:89 Scr:0.85  10/24 CrCl:37.71  ml/min  Prescription refilled

## 2023-12-17 ENCOUNTER — Ambulatory Visit

## 2023-12-21 ENCOUNTER — Ambulatory Visit (INDEPENDENT_AMBULATORY_CARE_PROVIDER_SITE_OTHER)

## 2023-12-21 DIAGNOSIS — E538 Deficiency of other specified B group vitamins: Secondary | ICD-10-CM | POA: Diagnosis not present

## 2023-12-21 MED ORDER — CYANOCOBALAMIN 1000 MCG/ML IJ SOLN
1000.0000 ug | Freq: Once | INTRAMUSCULAR | Status: AC
Start: 2023-12-21 — End: 2023-12-21
  Administered 2023-12-21: 1000 ug via INTRAMUSCULAR

## 2023-12-21 NOTE — Progress Notes (Signed)
 After obtaining consent, and per orders of Dr. Lawerance Bach, injection of B12 given by Ferdie Ping. Patient instructed to report any adverse reaction to me immediately.

## 2023-12-24 ENCOUNTER — Encounter: Payer: Self-pay | Admitting: Internal Medicine

## 2023-12-24 ENCOUNTER — Other Ambulatory Visit: Payer: Self-pay | Admitting: Internal Medicine

## 2023-12-24 DIAGNOSIS — I10 Essential (primary) hypertension: Secondary | ICD-10-CM

## 2023-12-24 DIAGNOSIS — E038 Other specified hypothyroidism: Secondary | ICD-10-CM

## 2024-01-01 ENCOUNTER — Other Ambulatory Visit: Payer: Self-pay | Admitting: Internal Medicine

## 2024-01-07 ENCOUNTER — Other Ambulatory Visit: Payer: Self-pay

## 2024-01-07 MED ORDER — PRAVASTATIN SODIUM 10 MG PO TABS: 10.0000 mg | ORAL_TABLET | Freq: Every day | ORAL | 1 refills | Status: DC

## 2024-01-25 ENCOUNTER — Ambulatory Visit: Payer: Medicare Other | Admitting: Podiatry

## 2024-01-27 ENCOUNTER — Ambulatory Visit: Payer: Medicare Other | Admitting: Cardiology

## 2024-01-28 ENCOUNTER — Ambulatory Visit

## 2024-02-05 ENCOUNTER — Ambulatory Visit

## 2024-02-09 ENCOUNTER — Ambulatory Visit: Admitting: Podiatry

## 2024-02-11 ENCOUNTER — Encounter: Payer: Self-pay | Admitting: Internal Medicine

## 2024-02-11 ENCOUNTER — Encounter: Admitting: Internal Medicine

## 2024-02-11 NOTE — Patient Instructions (Addendum)
   B12 injection today    Blood work was ordered.       Medications changes include :   None      Return in about 4 months (around 06/14/2024) for follow up.

## 2024-02-11 NOTE — Progress Notes (Signed)
 Subjective:    Patient ID: Grace Alvarado, female    DOB: 1925-06-06, 89 y.o.   MRN: [id number]     HPI Grace Alvarado is here for follow up of her chronic medical problems.  She is here with her daughter.  Leg edema increased at times-she took double torsemide  3 times last week and once this week.    Had covid booster Tuesday  Had basal cell carcinoma on her nose frozen this week.  It is recurrent  Has persistent allergy symptoms, has not been taking anything  Increased confusion and forgetting.  Sometimes forgets to take her medications.  Has not been eating quite as well.  Has not fallen.   Medications and allergies reviewed with patient and updated if appropriate.  Current Outpatient Medications on File Prior to Visit  Medication Sig Dispense Refill   acetaminophen  (TYLENOL ) 325 MG tablet Take 1-2 tablets (325-650 mg total) by mouth every 6 (six) hours as needed for mild pain (pain score 1-3 or temp > 100.5).     B Complex-C (B-COMPLEX WITH VITAMIN C ) tablet Take 1 tablet by mouth daily.     diltiazem  (CARDIZEM  SR) 120 MG 12 hr capsule Take 1 capsule (120 mg total) by mouth 2 (two) times daily. 180 capsule 3   ferrous sulfate  325 (65 FE) MG EC tablet TAKE 1 TABLET (325 MG TOTAL) BY MOUTH EVERY OTHER DAY. FOLLOW UP WITH YOUR PCP REGARDING YOUR IRON  DEFICIENCY OUTPATIENT 45 tablet 1   KLOR-CON  M10 10 MEQ tablet TAKE 1 TABLET BY MOUTH EVERY DAY 90 tablet 3   levothyroxine  (SYNTHROID ) 75 MCG tablet TAKE 1 TABLET BY MOUTH EVERY DAY BEFORE BREAKFAST 90 tablet 1   pantoprazole  (PROTONIX ) 40 MG tablet Take 1 tablet (40 mg total) by mouth daily. 90 tablet 3   pravastatin  (PRAVACHOL ) 10 MG tablet Take 1 tablet (10 mg total) by mouth daily. 30 tablet 1   torsemide  (DEMADEX ) 20 MG tablet TAKE 2 TABS (40 MG) BY MOUTH DAILY. 180 tablet 1   Torsemide  40 MG TABS Take 20 mg by mouth daily. (Patient taking differently: Take 20 mg by mouth as needed.)     XARELTO  15 MG TABS tablet TAKE 1 TABLET  BY MOUTH EVERY DAY WITH SUPPER 90 tablet 1   No current facility-administered medications on file prior to visit.     Review of Systems  Constitutional:  Negative for fever.  HENT:  Negative for trouble swallowing.   Respiratory:  Positive for cough (related to sinuses). Negative for shortness of breath and wheezing.   Cardiovascular:  Positive for leg swelling. Negative for chest pain and palpitations.  Gastrointestinal:  Negative for abdominal pain.       Occ gerd  Neurological:  Positive for headaches (occ). Negative for light-headedness.  Psychiatric/Behavioral:  Negative for sleep disturbance.        Objective:   Vitals:   02/12/24 1337  BP: 116/68  Pulse: 88  Temp: 98 F (36.7 C)  SpO2: 98%   BP Readings from Last 3 Encounters:  02/12/24 116/68  11/19/23 120/64  09/22/23 116/80   Wt Readings from Last 3 Encounters:  02/12/24 136 lb (61.7 kg)  11/19/23 136 lb (61.7 kg)  09/22/23 140 lb (63.5 kg)   Body mass index is 22.63 kg/m.    Physical Exam Constitutional:      General: She is not in acute distress.    Appearance: Normal appearance.  HENT:     Head: Normocephalic and  atraumatic.  Eyes:     Conjunctiva/sclera: Conjunctivae normal.  Cardiovascular:     Rate and Rhythm: Normal rate and regular rhythm.     Heart sounds: Normal heart sounds.  Pulmonary:     Effort: Pulmonary effort is normal. No respiratory distress.     Breath sounds: Normal breath sounds. No wheezing.  Musculoskeletal:     Cervical back: Neck supple.     Right lower leg: No edema.     Left lower leg: No edema.  Lymphadenopathy:     Cervical: No cervical adenopathy.  Skin:    General: Skin is warm and dry.     Findings: No rash.  Neurological:     Mental Status: She is alert. Mental status is at baseline.  Psychiatric:        Mood and Affect: Mood normal.        Behavior: Behavior normal.        Lab Results  Component Value Date   WBC 5.0 10/21/2023   HGB 13.1  10/21/2023   HCT 39.5 10/21/2023   PLT 160.0 10/21/2023   GLUCOSE 85 07/23/2023   CHOL 130 10/22/2022   TRIG 57.0 10/22/2022   HDL 64.50 10/22/2022   LDLCALC 54 10/22/2022   ALT 9 06/22/2023   AST 18 06/22/2023   NA 137 07/23/2023   K 4.0 07/23/2023   CL 96 07/23/2023   CREATININE 0.85 07/23/2023   BUN 12 07/23/2023   CO2 25 07/23/2023   TSH 4.01 10/22/2022   INR 2.64 05/21/2018   HGBA1C 5.2 12/18/2020     Assessment & Plan:    See Problem List for Assessment and Plan of chronic medical problems.

## 2024-02-12 ENCOUNTER — Ambulatory Visit (INDEPENDENT_AMBULATORY_CARE_PROVIDER_SITE_OTHER): Admitting: Internal Medicine

## 2024-02-12 VITALS — BP 116/68 | HR 88 | Temp 98.0°F | Ht 65.0 in | Wt 136.0 lb

## 2024-02-12 DIAGNOSIS — E038 Other specified hypothyroidism: Secondary | ICD-10-CM

## 2024-02-12 DIAGNOSIS — E785 Hyperlipidemia, unspecified: Secondary | ICD-10-CM

## 2024-02-12 DIAGNOSIS — I482 Chronic atrial fibrillation, unspecified: Secondary | ICD-10-CM

## 2024-02-12 DIAGNOSIS — E538 Deficiency of other specified B group vitamins: Secondary | ICD-10-CM | POA: Diagnosis not present

## 2024-02-12 DIAGNOSIS — I2510 Atherosclerotic heart disease of native coronary artery without angina pectoris: Secondary | ICD-10-CM | POA: Diagnosis not present

## 2024-02-12 DIAGNOSIS — K219 Gastro-esophageal reflux disease without esophagitis: Secondary | ICD-10-CM

## 2024-02-12 DIAGNOSIS — D509 Iron deficiency anemia, unspecified: Secondary | ICD-10-CM | POA: Diagnosis not present

## 2024-02-12 DIAGNOSIS — I10 Essential (primary) hypertension: Secondary | ICD-10-CM

## 2024-02-12 DIAGNOSIS — R600 Localized edema: Secondary | ICD-10-CM

## 2024-02-12 LAB — CBC WITH DIFFERENTIAL/PLATELET
Basophils Absolute: 0 10*3/uL (ref 0.0–0.1)
Basophils Relative: 0.3 % (ref 0.0–3.0)
Eosinophils Absolute: 0.1 10*3/uL (ref 0.0–0.7)
Eosinophils Relative: 1.7 % (ref 0.0–5.0)
HCT: 38.6 % (ref 36.0–46.0)
Hemoglobin: 12.6 g/dL (ref 12.0–15.0)
Lymphocytes Relative: 36.2 % (ref 12.0–46.0)
Lymphs Abs: 1.5 10*3/uL (ref 0.7–4.0)
MCHC: 32.6 g/dL (ref 30.0–36.0)
MCV: 95.6 fl (ref 78.0–100.0)
Monocytes Absolute: 0.5 10*3/uL (ref 0.1–1.0)
Monocytes Relative: 11.9 % (ref 3.0–12.0)
Neutro Abs: 2.1 10*3/uL (ref 1.4–7.7)
Neutrophils Relative %: 49.9 % (ref 43.0–77.0)
Platelets: 142 10*3/uL — ABNORMAL LOW (ref 150.0–400.0)
RBC: 4.04 Mil/uL (ref 3.87–5.11)
RDW: 13.7 % (ref 11.5–15.5)
WBC: 4.2 10*3/uL (ref 4.0–10.5)

## 2024-02-12 LAB — COMPREHENSIVE METABOLIC PANEL WITH GFR
ALT: 19 U/L (ref 0–35)
AST: 21 U/L (ref 0–37)
Albumin: 4.2 g/dL (ref 3.5–5.2)
Alkaline Phosphatase: 66 U/L (ref 39–117)
BUN: 13 mg/dL (ref 6–23)
CO2: 31 meq/L (ref 19–32)
Calcium: 9.8 mg/dL (ref 8.4–10.5)
Chloride: 100 meq/L (ref 96–112)
Creatinine, Ser: 0.8 mg/dL (ref 0.40–1.20)
GFR: 62.13 mL/min (ref >60.00)
Glucose, Bld: 91 mg/dL (ref 70–99)
Potassium: 4.1 meq/L (ref 3.5–5.1)
Sodium: 138 meq/L (ref 135–145)
Total Bilirubin: 0.6 mg/dL (ref 0.2–1.2)
Total Protein: 7.5 g/dL (ref 6.0–8.3)

## 2024-02-12 LAB — IBC PANEL
Iron: 64 ug/dL (ref 42–145)
Saturation Ratios: 14 % — ABNORMAL LOW (ref 20.0–50.0)
TIBC: 456.4 ug/dL — ABNORMAL HIGH (ref 250.0–450.0)
Transferrin: 326 mg/dL (ref 212.0–360.0)

## 2024-02-12 LAB — LIPID PANEL
Cholesterol: 121 mg/dL (ref 0–200)
HDL: 65 mg/dL (ref >39.00)
LDL Cholesterol: 45 mg/dL (ref 0–99)
NonHDL: 55.85
Total CHOL/HDL Ratio: 2
Triglycerides: 56 mg/dL (ref 0.0–149.0)
VLDL: 11.2 mg/dL (ref 0.0–40.0)

## 2024-02-12 LAB — TSH: TSH: 3.88 u[IU]/mL (ref 0.35–5.50)

## 2024-02-12 LAB — FERRITIN: Ferritin: 14.4 ng/mL (ref 10.0–291.0)

## 2024-02-12 MED ORDER — TORSEMIDE 20 MG PO TABS: 20.0000 mg | ORAL_TABLET | Freq: Every day | ORAL | Status: DC

## 2024-02-12 MED ORDER — CYANOCOBALAMIN 1000 MCG/ML IJ SOLN
1000.0000 ug | Freq: Once | INTRAMUSCULAR | Status: AC
Start: 2024-02-12 — End: 2024-02-12
  Administered 2024-02-12: 1000 ug via INTRAMUSCULAR

## 2024-02-12 NOTE — Assessment & Plan Note (Signed)
 Chronic Has been taking iron  two tabs daily Check cbc, iron  panel

## 2024-02-12 NOTE — Assessment & Plan Note (Signed)
Chronic Continue monthly B12 injections Injection due today-given

## 2024-02-12 NOTE — Assessment & Plan Note (Signed)
 Chronic Blood pressure today well-controlled Continue diltiazem  120 mg twice daily CBC, CMP

## 2024-02-12 NOTE — Assessment & Plan Note (Signed)
Chronic Following with cardiology No symptoms consistent with angina Continue current medications

## 2024-02-12 NOTE — Assessment & Plan Note (Addendum)
 Chronic Stable Continue torsemide  20 mg daily Continue potassium chloride  10 mill equivalents daily Will take extra torsemide  for 40 mg daily as needed for increased swelling She does weigh herself daily CMP

## 2024-02-12 NOTE — Assessment & Plan Note (Signed)
 Chronic Lipids have been well controlled Continue pravastatin  10 mg daily Check lipid panel

## 2024-02-12 NOTE — Assessment & Plan Note (Addendum)
 Chronic History of GI bleeds, on Xarelto  Occ gerd only Takes Tums as needed No longer taking pantoprazole  40 mg daily Check CBC-concerned with history of GI bleeds and on the Xarelto  if she needs to be on prophylactic acid suppression

## 2024-02-12 NOTE — Assessment & Plan Note (Addendum)
 Chronic Asymptomatic Heart rate controlled here today Continue Xarelto  15 mg daily, diltiazem  120 mg twice daily Has not had any falls Family is concerned about her being on the Xarelto  and wonders if she needs to continue it-will discuss with cardiology CBC, CMP, TSH

## 2024-02-12 NOTE — Assessment & Plan Note (Signed)
 Chronic  Clinically euthyroid Check TSH Currently taking levothyroxine  75 mcg daily

## 2024-02-13 ENCOUNTER — Encounter: Payer: Self-pay | Admitting: Internal Medicine

## 2024-02-16 ENCOUNTER — Ambulatory Visit: Admitting: Podiatry

## 2024-02-16 DIAGNOSIS — M79674 Pain in right toe(s): Secondary | ICD-10-CM | POA: Diagnosis not present

## 2024-02-16 DIAGNOSIS — I739 Peripheral vascular disease, unspecified: Secondary | ICD-10-CM | POA: Diagnosis not present

## 2024-02-16 DIAGNOSIS — L84 Corns and callosities: Secondary | ICD-10-CM

## 2024-02-16 DIAGNOSIS — B351 Tinea unguium: Secondary | ICD-10-CM | POA: Diagnosis not present

## 2024-02-16 DIAGNOSIS — M79675 Pain in left toe(s): Secondary | ICD-10-CM

## 2024-02-16 NOTE — Progress Notes (Unsigned)
    Subjective:  Patient ID: Grace Alvarado, female    DOB: Jan 22, 1925,  MRN: [id number]  Grace Alvarado presents to clinic today for:  Chief Complaint  Patient presents with   Nail Problem    RFC   Patient notes nails are thick and elongated, causing pain in shoe gear when ambulating.  Her daughter, who lives in [place] , is with her today for her visit.  Her daughter also requested the callus at the left bunion be shaved.  PCP is Colene Dauphin, MD. last seen on 02/12/2024  Past Medical History:  Diagnosis Date   Atrial fibrillation (HCC)    Hyperlipidemia    Hypertension    Hypothyroidism    Mitral regurgitation    MVP (mitral valve prolapse)    Allergies  Allergen Reactions   Codeine Other (See Comments)    unknown   Penicillins Other (See Comments)    unknown   Vancomycin Other (See Comments)    unknown   Zithromax [Azithromycin] Other (See Comments)    unknown   Latex Rash    Objective:  Grace Alvarado is a pleasant 89 y.o. female in NAD. AAO x 3.  Vascular Examination: Patient has palpable DP pulse, absent PT pulse bilateral.  Delayed capillary refill bilateral toes.  Sparse digital hair bilateral.  Proximal to distal cooling WNL bilateral.    Dermatological Examination: Interspaces are clear with no open lesions noted bilateral.  Skin is shiny and atrophic bilateral.  Nails are 3-63mm thick, with yellowish/brown discoloration, subungual debris and distal onycholysis x10.  There is pain with compression of nails x10.  There are hyperkeratotic lesions noted on the plantar medial aspect of the left first MPJ.  Patient qualifies for at-risk foot care because of PVD.  Assessment/Plan: 1. Pain due to onychomycosis of toenails of both feet   2. Callus of foot   3. PVD (peripheral vascular disease) (HCC)    Mycotic nails x10 were sharply debrided with sterile nail nippers and power debriding burr to decrease bulk and length.  Hyperkeratotic lesion on the left bunion  was shaved with #312 blade.  Return in about 3 months (around 05/18/2024) for RFC.   Joe Murders, DPM, FACFAS [facility]     [address] [place], [place] [address]                Office [phone]  Fax [phone]

## 2024-02-18 ENCOUNTER — Ambulatory Visit

## 2024-03-15 ENCOUNTER — Ambulatory Visit (INDEPENDENT_AMBULATORY_CARE_PROVIDER_SITE_OTHER)

## 2024-03-15 VITALS — Ht 63.0 in | Wt 136.0 lb

## 2024-03-15 DIAGNOSIS — Z0000 Encounter for general adult medical examination without abnormal findings: Secondary | ICD-10-CM | POA: Diagnosis not present

## 2024-03-15 DIAGNOSIS — Z0100 Encounter for examination of eyes and vision without abnormal findings: Secondary | ICD-10-CM

## 2024-03-15 NOTE — Patient Instructions (Addendum)
 Ms. Vorce , Thank you for taking time out of your busy schedule to complete your Annual Wellness Visit with me. I enjoyed our conversation and look forward to speaking with you again next year. I, as well as your care team,  appreciate your ongoing commitment to your health goals. Please review the following plan we discussed and let me know if I can assist you in the future. Your Game plan/ To Do List    Referrals: If you haven't heard from the office you've been referred to, please reach out to them at the phone provided.  Referral to [facility] for an eye exam Follow up Visits: Next Medicare AWV with our clinical staff: 03/17/2025   Have you seen your provider in the last 6 months (3 months if uncontrolled diabetes)? Yes Next Office Visit with your provider: to be scheduled by daughter  Clinician Recommendations:  Aim for 30 minutes of exercise or brisk walking, 6-8 glasses of water, and 5 servings of fruits and vegetables each day.       This is a list of the screening recommended for you and due dates:  Health Maintenance  Topic Date Due   Zoster (Shingles) Vaccine (1 of 2) Never done   COVID-19 Vaccine (5 - Mixed Product risk 2024-25 season) 12/21/2023   DEXA scan (bone density measurement)  03/15/2025*   Flu Shot  05/06/2024   Medicare Annual Wellness Visit  03/15/2025   DTaP/Tdap/Td vaccine (2 - Td or Tdap) 02/21/2030   Pneumonia Vaccine  Completed   HPV Vaccine  Aged Out   Meningitis B Vaccine  Aged Out  *Topic was postponed. The date shown is not the original due date.    Advanced directives: (In Chart) A copy of your advanced directives are scanned into your chart should your provider ever need it. Advance Care Planning is important because it:  [x]  Makes sure you receive the medical care that is consistent with your values, goals, and preferences  [x]  It provides guidance to your family and loved ones and reduces their decisional burden about whether or not they are  making the right decisions based on your wishes.  Follow the link provided in your after visit summary or read over the paperwork we have mailed to you to help you started getting your Advance Directives in place. If you need assistance in completing these, please reach out to us  so that we can help you!

## 2024-03-15 NOTE — Progress Notes (Signed)
 Subjective:   Grace Alvarado is a 89 y.o. who presents for a Medicare Wellness preventive visit.  As a reminder, Annual Wellness Visits don't include a physical exam, and some assessments may be limited, especially if this visit is performed virtually. We may recommend an in-person follow-up visit with your provider if needed.  Visit Complete: Virtual I connected with  [name] Hence on 03/15/24 by a audio enabled telemedicine application and verified that I am speaking with the correct person using two identifiers.  Patient Location: Home  Provider Location: Office/Clinic  I discussed the limitations of evaluation and management by telemedicine. The patient expressed understanding and agreed to proceed.  Vital Signs: Because this visit was a virtual/telehealth visit, some criteria may be missing or patient reported. Any vitals not documented were not able to be obtained and vitals that have been documented are patient reported.  VideoDeclined- This patient declined Librarian, academic. Therefore the visit was completed with audio only.  Persons Participating in Visit: Patient assisted by Daughter, Auther Legacy.  AWV Questionnaire: No: Patient Medicare AWV questionnaire was not completed prior to this visit.  Cardiac Risk Factors include: advanced age (>54men, >80 women);dyslipidemia;hypertension     Objective:     Today's Vitals   03/15/24 0939  Weight: 136 lb (61.7 kg)  Height: 5\' 3"  (1.6 m)   Body mass index is 24.09 kg/m.     03/15/2024    9:38 AM 04/13/2023   10:00 AM 04/12/2023   10:03 AM 04/11/2023    6:20 PM 04/11/2023    1:18 PM 07/31/2021    7:48 PM 06/20/2020   10:40 AM  Advanced Directives  Does Patient Have a Medical Advance Directive? Yes Yes Yes No No No No  Type of Estate agent of [place];Living will  Living will      Does patient want to make changes to medical advance directive? No - Patient declined No -  Patient declined       Copy of Healthcare Power of Attorney in Chart? Yes - validated most recent copy scanned in chart (See row information)        Would patient like information on creating a medical advance directive?    No - Patient declined  No - Patient declined No - Patient declined  Pre-existing out of facility DNR order (yellow form or pink MOST form)   --        Current Medications (verified) Outpatient Encounter Medications as of 03/15/2024  Medication Sig   acetaminophen  (TYLENOL ) 325 MG tablet Take 1-2 tablets (325-650 mg total) by mouth every 6 (six) hours as needed for mild pain (pain score 1-3 or temp > 100.5).   B Complex-C (B-COMPLEX WITH VITAMIN C ) tablet Take 1 tablet by mouth daily.   diltiazem  (CARDIZEM  SR) 120 MG 12 hr capsule Take 1 capsule (120 mg total) by mouth 2 (two) times daily.   KLOR-CON  M10 10 MEQ tablet TAKE 1 TABLET BY MOUTH EVERY DAY   levothyroxine  (SYNTHROID ) 75 MCG tablet TAKE 1 TABLET BY MOUTH EVERY DAY BEFORE BREAKFAST   pravastatin  (PRAVACHOL ) 10 MG tablet Take 1 tablet (10 mg total) by mouth daily.   torsemide  (DEMADEX ) 20 MG tablet Take 1 tablet (20 mg total) by mouth daily. Take an extra dose as needed for increased leg edema   XARELTO  15 MG TABS tablet TAKE 1 TABLET BY MOUTH EVERY DAY WITH SUPPER   ferrous sulfate  325 (65 FE) MG EC tablet TAKE  1 TABLET (325 MG TOTAL) BY MOUTH EVERY OTHER DAY. FOLLOW UP WITH YOUR PCP REGARDING YOUR IRON  DEFICIENCY OUTPATIENT   No facility-administered encounter medications on file as of 03/15/2024.    Allergies (verified) Codeine, Penicillins, Vancomycin, Zithromax [azithromycin], and Latex   History: Past Medical History:  Diagnosis Date   Atrial fibrillation (HCC)    Hyperlipidemia    Hypertension    Hypothyroidism    Mitral regurgitation    MVP (mitral valve prolapse)    Past Surgical History:  Procedure Laterality Date   APPENDECTOMY     c section     CESAREAN SECTION     CHOLECYSTECTOMY      COLONOSCOPY WITH PROPOFOL  N/A 11/09/2018   Procedure: COLONOSCOPY WITH PROPOFOL ;  Surgeon: Albertina Hugger, MD;  Location: [facility] ENDOSCOPY;  Service: Gastroenterology;  Laterality: N/A;   ESOPHAGOGASTRODUODENOSCOPY N/A 04/12/2023   Procedure: ESOPHAGOGASTRODUODENOSCOPY (EGD);  Surgeon: Albertina Hugger, MD;  Location: [facility] ENDOSCOPY;  Service: Gastroenterology;  Laterality: N/A;   ESOPHAGOGASTRODUODENOSCOPY (EGD) WITH PROPOFOL  N/A 11/09/2018   Procedure: ESOPHAGOGASTRODUODENOSCOPY (EGD) WITH PROPOFOL ;  Surgeon: Albertina Hugger, MD;  Location: [facility] ENDOSCOPY;  Service: Gastroenterology;  Laterality: N/A;   HOT HEMOSTASIS N/A 11/09/2018   Procedure: HOT HEMOSTASIS (ARGON PLASMA COAGULATION/BICAP);  Surgeon: Albertina Hugger, MD;  Location: [facility]COPY;  Service: Gastroenterology;  Laterality: N/A;   I & D EXTREMITY Left 03/02/2020   Procedure: evacuation of left knee hematoma, possible wound vac placement;  Surgeon: Jasmine Mesi, MD;  Location: [facility] OR;  Service: Orthopedics;  Laterality: Left;   SUBMUCOSAL INJECTION  11/09/2018   Procedure: SUBMUCOSAL INJECTION;  Surgeon: Albertina Hugger, MD;  Location: [facility] ENDOSCOPY;  Service: Gastroenterology;;   Family History  Problem Relation Age of Onset   Cancer Mother    Dementia Mother    Social History   Socioeconomic History   Marital status: Widowed    Spouse name: Not on file   Number of children: 3   Years of education: Not on file   Highest education level: Not on file  Occupational History   Occupation: retired  Tobacco Use   Smoking status: Former   Smokeless tobacco: Never  Advertising account planner   Vaping status: Never Used  Substance and Sexual Activity   Alcohol use: Not Currently   Drug use: Never   Sexual activity: Not Currently  Other Topics Concern   Not on file  Social History Narrative   Not on file   Social Drivers of Health   Financial Resource Strain: Low Risk  (03/15/2024)   Overall Financial Resource Strain (CARDIA)     Difficulty of Paying Living Expenses: Not hard at all  Food Insecurity: No Food Insecurity (03/15/2024)   Hunger Vital Sign    Worried About Running Out of Food in the Last Year: Never true    Ran Out of Food in the Last Year: Never true  Transportation Needs: No Transportation Needs (03/15/2024)   PRAPARE - Administrator, Civil Service (Medical): No    Lack of Transportation (Non-Medical): No  Physical Activity: Sufficiently Active (03/15/2024)   Exercise Vital Sign    Days of Exercise per Week: 7 days    Minutes of Exercise per Session: 30 min  Stress: No Stress Concern Present (03/15/2024)   [employer] of Occupational Health - Occupational Stress Questionnaire    Feeling of Stress : Not at all  Social Connections: Socially Isolated (03/15/2024)   Social Connection and  Isolation Panel [NHANES]    Frequency of Communication with Friends and Family: More than three times a week    Frequency of Social Gatherings with Friends and Family: More than three times a week    Attends Religious Services: Never    Database administrator or Organizations: No    Attends Banker Meetings: Never    Marital Status: Widowed    Tobacco Counseling Counseling given: No    Clinical Intake:  Pre-visit preparation completed: Yes  Pain : No/denies pain     BMI - recorded: 24.09 Nutritional Status: BMI of 19-24  Normal Nutritional Risks: None Diabetes: No  Lab Results  Component Value Date   HGBA1C 5.2 12/18/2020   HGBA1C 5.0 12/06/2019   HGBA1C 5.0 06/07/2019     How often do you need to have someone help you when you read instructions, pamphlets, or other written materials from your doctor or pharmacy?: 3 - Sometimes (audio visit - per pt/dtr)  Interpreter Needed?: No  Information entered by :: Kandy Orris, CMA   Activities of Daily Living     03/15/2024    9:42 AM 04/11/2023    6:20 PM  In your present state of health, do you have any difficulty  performing the following activities:  Hearing? 0 1  Vision? 0 0  Difficulty concentrating or making decisions? 0 0  Walking or climbing stairs? 0 1  Dressing or bathing? 0 0  Doing errands, shopping? 0 1  Preparing Food and eating ? N   Using the Toilet? N   In the past six months, have you accidently leaked urine? Y   Comment wears depends/pads   Do you have problems with loss of bowel control? N   Managing your Medications? N   Managing your Finances? N   Housekeeping or managing your Housekeeping? N     Patient Care Team: Colene Dauphin, MD as PCP - General (Internal Medicine) Audery Blazing Deannie Fabian, MD as PCP - Cardiology (Cardiology) [facility] (Ophthalmology)  I have updated your Care Teams any recent Medical Services you may have received from other providers in the past year.     Assessment:    This is a routine wellness examination for Michell.  Hearing/Vision screen Hearing Screening - Comments:: Denies hearing difficulties   Vision Screening - Comments:: Wears eyeglasses - referral to [facility]   Goals Addressed               This Visit's Progress     patient stated (pt-stated)        Patient stated she plans to continue walking       Depression Screen     03/15/2024    9:44 AM 11/19/2023    3:51 PM 09/17/2023    9:50 AM 08/18/2023    8:22 AM 05/11/2023    2:05 PM 04/22/2023   10:57 AM 12/23/2022    2:37 PM  PHQ 2/9 Scores  PHQ - 2 Score 0 0 0 0 0 0 0  PHQ- 9 Score 3  0    3    Fall Risk     03/15/2024    9:43 AM 11/19/2023    4:00 PM 09/17/2023    9:50 AM 08/18/2023    8:22 AM 05/11/2023    2:05 PM  Fall Risk   Falls in the past year? 0 0 0 0 0  Number falls in past yr: 0 0 0 0 0  Injury  with Fall? 0 0 0 0 1  Risk for fall due to : No Fall Risks No Fall Risks No Fall Risks No Fall Risks No Fall Risks  Follow up Falls evaluation completed;Falls prevention discussed Falls evaluation completed Falls evaluation completed Falls  evaluation completed Falls evaluation completed    MEDICARE RISK AT HOME:  Medicare Risk at Home Any stairs in or around the home?: No If so, are there any without handrails?: No Home free of loose throw rugs in walkways, pet beds, electrical cords, etc?: Yes Adequate lighting in your home to reduce risk of falls?: Yes Life alert?: No Use of a cane, walker or w/c?: Yes (walker/wheelchair) Grab bars in the bathroom?: Yes Shower chair or bench in shower?: Yes Elevated toilet seat or a handicapped toilet?: Yes  TIMED UP AND GO:  Was the test performed?  No  Cognitive Function: Declined/Normal: No cognitive concerns noted by patient or family. Patient alert, oriented, able to answer questions appropriately and recall recent events. No signs of memory loss or confusion.    03/15/2024    9:45 AM  MMSE - Mini Mental State Exam  Not completed: Refused        Immunizations Immunization History  Administered Date(s) Administered   Fluad Quad(high Dose 65+) 06/07/2019, 06/25/2021, 06/13/2022   Influenza, High Dose Seasonal PF 06/30/2018, 06/08/2020, 07/31/2023   PFIZER(Purple Top)SARS-COV-2 Vaccination 10/25/2019, 11/14/2019   Pfizer(Comirnaty)Fall Seasonal Vaccine 12 years and older 06/23/2023   Pneumococcal Conjugate-13 06/30/2018   Pneumococcal Polysaccharide-23 03/05/2021   Respiratory Syncytial Virus Vaccine,Recomb Aduvanted(Arexvy) 06/13/2022   Tdap 02/22/2020   Unspecified SARS-COV-2 Vaccination 07/10/2022    Screening Tests Health Maintenance  Topic Date Due   Zoster Vaccines- Shingrix (1 of 2) Never done   COVID-19 Vaccine (5 - Mixed Product risk 2024-25 season) 12/21/2023   DEXA SCAN  03/15/2025 (Originally 07/18/1990)   INFLUENZA VACCINE  05/06/2024   Medicare Annual Wellness (AWV)  03/15/2025   DTaP/Tdap/Td (2 - Td or Tdap) 02/21/2030   Pneumonia Vaccine 52+ Years old  Completed   HPV VACCINES  Aged Out   Meningococcal B Vaccine  Aged Out    Health  Maintenance  Health Maintenance Due  Topic Date Due   Zoster Vaccines- Shingrix (1 of 2) Never done   COVID-19 Vaccine (5 - Mixed Product risk 2024-25 season) 12/21/2023   Health Maintenance Items Addressed: 03/15/2024 Referral to [facility] for a routine eye exam   Additional Screening:  Vision Screening: Recommended annual ophthalmology exams for early detection of glaucoma and other disorders of the eye. Would you like a referral to an eye doctor? Yes- referral sent to [facility].   Dental Screening: Recommended annual dental exams for proper oral hygiene  Community Resource Referral / Chronic Care Management: CRR required this visit?  No   CCM required this visit?  No   Plan:    I have personally reviewed and noted the following in the patient's chart:   Medical and social history Use of alcohol, tobacco or illicit drugs  Current medications and supplements including opioid prescriptions. Patient is not currently taking opioid prescriptions. Functional ability and status Nutritional status Physical activity Advanced directives List of other physicians Hospitalizations, surgeries, and ER visits in previous 12 months Vitals Screenings to include cognitive, depression, and falls Referrals and appointments  In addition, I have reviewed and discussed with patient certain preventive protocols, quality metrics, and best practice recommendations. A written personalized care plan for preventive services as well as  general preventive health recommendations were provided to patient.   Patria Bookbinder, CMA   03/15/2024   After Visit Summary: (MyChart) Due to this being a telephonic visit, the after visit summary with patients personalized plan was offered to patient via MyChart   Notes: Nothing significant to report at this time.

## 2024-04-07 NOTE — Progress Notes (Signed)
 HPI: FU atrial fibrillation. Patient previously resided in [place] .  Patient with history of permanent atrial fibrillation.  Previously failed amiodarone and multaq per records. Nuclear study 2012 showed no ischemia with normal LV function. ABIs April 2021 normal. Venous Dopplers March 2023 showed no DVT.  Echocardiogram February 2024 showed normal LV function, mild left ventricular hypertrophy, flattened septum consistent with RV volume overload, severe biatrial enlargement, moderate to severe mitral regurgitation, moderate tricuspid regurgitation. Patient made it clear previously that she did not want valve procedures/surgery.  Had GI bleed July 2024.  EGD unrevealing.  Abdominal CT suggested colitis.  Since last seen she denies increased dyspnea, chest pain, palpitations or syncope.  She continues to have occasional bilateral lower extremity edema and weight gain for which she takes additional torsemide .  Current Outpatient Medications  Medication Sig Dispense Refill   acetaminophen  (TYLENOL ) 325 MG tablet Take 1-2 tablets (325-650 mg total) by mouth every 6 (six) hours as needed for mild pain (pain score 1-3 or temp > 100.5).     diltiazem  (CARDIZEM  SR) 120 MG 12 hr capsule Take 1 capsule (120 mg total) by mouth 2 (two) times daily. 180 capsule 3   levothyroxine  (SYNTHROID ) 75 MCG tablet TAKE 1 TABLET BY MOUTH EVERY DAY BEFORE BREAKFAST 90 tablet 1   B Complex-C (B-COMPLEX WITH VITAMIN C ) tablet Take 1 tablet by mouth daily. (Patient not taking: Reported on 04/14/2024)     ferrous sulfate  325 (65 FE) MG EC tablet TAKE 1 TABLET (325 MG TOTAL) BY MOUTH EVERY OTHER DAY. FOLLOW UP WITH YOUR PCP REGARDING YOUR IRON  DEFICIENCY OUTPATIENT 45 tablet 1   KLOR-CON  M10 10 MEQ tablet TAKE 1 TABLET BY MOUTH EVERY DAY (Patient not taking: Reported on 04/14/2024) 90 tablet 3   pravastatin  (PRAVACHOL ) 10 MG tablet Take 1 tablet (10 mg total) by mouth daily. 30 tablet 1   torsemide  (DEMADEX ) 20 MG tablet Take 1  tablet (20 mg total) by mouth daily. Take an extra dose as needed for increased leg edema     XARELTO  15 MG TABS tablet TAKE 1 TABLET BY MOUTH EVERY DAY WITH SUPPER 90 tablet 1   No current facility-administered medications for this visit.     Past Medical History:  Diagnosis Date   Atrial fibrillation (HCC)    Hyperlipidemia    Hypertension    Hypothyroidism    Mitral regurgitation    MVP (mitral valve prolapse)     Past Surgical History:  Procedure Laterality Date   APPENDECTOMY     c section     CESAREAN SECTION     CHOLECYSTECTOMY     COLONOSCOPY WITH PROPOFOL  N/A 11/09/2018   Procedure: COLONOSCOPY WITH PROPOFOL ;  Surgeon: Legrand Victory LITTIE DOUGLAS, MD;  Location: [facility] ENDOSCOPY;  Service: Gastroenterology;  Laterality: N/A;   ESOPHAGOGASTRODUODENOSCOPY N/A 04/12/2023   Procedure: ESOPHAGOGASTRODUODENOSCOPY (EGD);  Surgeon: Legrand Victory LITTIE DOUGLAS, MD;  Location: [facility] ENDOSCOPY;  Service: Gastroenterology;  Laterality: N/A;   ESOPHAGOGASTRODUODENOSCOPY (EGD) WITH PROPOFOL  N/A 11/09/2018   Procedure: ESOPHAGOGASTRODUODENOSCOPY (EGD) WITH PROPOFOL ;  Surgeon: Legrand Victory LITTIE DOUGLAS, MD;  Location: [facility] ENDOSCOPY;  Service: Gastroenterology;  Laterality: N/A;   HOT HEMOSTASIS N/A 11/09/2018   Procedure: HOT HEMOSTASIS (ARGON PLASMA COAGULATION/BICAP);  Surgeon: Legrand Victory LITTIE DOUGLAS, MD;  Location: [facility] ENDOSCOPY;  Service: Gastroenterology;  Laterality: N/A;   I & D EXTREMITY Left 03/02/2020   Procedure: evacuation of left knee hematoma, possible wound vac placement;  Surgeon: Addie Cordella Hamilton, MD;  Location: [facility]  OR;  Service: Orthopedics;  Laterality: Left;   SUBMUCOSAL INJECTION  11/09/2018   Procedure: SUBMUCOSAL INJECTION;  Surgeon: Legrand Victory LITTIE DOUGLAS, MD;  Location: [facility] ENDOSCOPY;  Service: Gastroenterology;;    Social History   Socioeconomic History   Marital status: Widowed    Spouse name: Not on file   Number of children: 3   Years of education: Not on file   Highest education level: Not on file   Occupational History   Occupation: retired  Tobacco Use   Smoking status: Former   Smokeless tobacco: Never  Advertising account planner   Vaping status: Never Used  Substance and Sexual Activity   Alcohol use: Not Currently   Drug use: Never   Sexual activity: Not Currently  Other Topics Concern   Not on file  Social History Narrative   Not on file   Social Drivers of Health   Financial Resource Strain: Low Risk  (03/15/2024)   Overall Financial Resource Strain (CARDIA)    Difficulty of Paying Living Expenses: Not hard at all  Food Insecurity: No Food Insecurity (03/15/2024)   Hunger Vital Sign    Worried About Running Out of Food in the Last Year: Never true    Ran Out of Food in the Last Year: Never true  Transportation Needs: No Transportation Needs (03/15/2024)   PRAPARE - Administrator, Civil Service (Medical): No    Lack of Transportation (Non-Medical): No  Physical Activity: Sufficiently Active (03/15/2024)   Exercise Vital Sign    Days of Exercise per Week: 7 days    Minutes of Exercise per Session: 30 min  Stress: No Stress Concern Present (03/15/2024)   [employer] of Occupational Health - Occupational Stress Questionnaire    Feeling of Stress : Not at all  Social Connections: Socially Isolated (03/15/2024)   Social Connection and Isolation Panel    Frequency of Communication with Friends and Family: More than three times a week    Frequency of Social Gatherings with Friends and Family: More than three times a week    Attends Religious Services: Never    Database administrator or Organizations: No    Attends Banker Meetings: Never    Marital Status: Widowed  Intimate Partner Violence: Not At Risk (03/15/2024)   Humiliation, Afraid, Rape, and Kick questionnaire    Fear of Current or Ex-Partner: No    Emotionally Abused: No    Physically Abused: No    Sexually Abused: No    Family History  Problem Relation Age of Onset   Cancer Mother     Dementia Mother     ROS: no fevers or chills, productive cough, hemoptysis, dysphasia, odynophagia, melena, hematochezia, dysuria, hematuria, rash, seizure activity, orthopnea, PND, pedal edema, claudication. Remaining systems are negative.  Physical Exam: Well-developed well-nourished in no acute distress.  Skin is warm and dry.  HEENT is normal.  Neck is supple.  Chest is clear to auscultation with normal expansion.  Cardiovascular exam is regular rate and rhythm.  Abdominal exam nontender or distended. No masses palpated. Extremities show 1+ ankle edema. neuro grossly intact  EKG Interpretation Date/Time:  Thursday April 14 2024 13:32:41 EDT Ventricular Rate:  83 PR Interval:    QRS Duration:  72 QT Interval:  374 QTC Calculation: 439 R Axis:   -9  Text Interpretation: Atrial fibrillation Low voltage QRS Nonspecific ST and T wave abnormality Confirmed by Pietro Rogue ([id number]) on 04/14/2024 1:35:22 PM    A/P  1 mitral valve prolapse/mitral regurgitation-given patient's age we are treating conservatively.  She has made it clear she would not consider any intervention.  We have therefore not pursued follow-up imaging.  2 hyperlipidemia-continue statin.  3 permanent atrial fibrillation-continue Cardizem  and Xarelto  at present dose.  4 hypertension-blood pressure is controlled.  Continue present medications.  5 lower extremity edema-we will continue diuretic at present dose.  Reasonably well-controlled.  Redell Shallow, MD

## 2024-04-14 ENCOUNTER — Encounter: Payer: Self-pay | Admitting: Cardiology

## 2024-04-14 ENCOUNTER — Ambulatory Visit: Attending: Cardiology | Admitting: Cardiology

## 2024-04-14 VITALS — BP 120/64 | HR 83 | Wt 139.0 lb

## 2024-04-14 DIAGNOSIS — I10 Essential (primary) hypertension: Secondary | ICD-10-CM | POA: Diagnosis not present

## 2024-04-14 DIAGNOSIS — E782 Mixed hyperlipidemia: Secondary | ICD-10-CM | POA: Diagnosis not present

## 2024-04-14 DIAGNOSIS — I482 Chronic atrial fibrillation, unspecified: Secondary | ICD-10-CM | POA: Diagnosis not present

## 2024-04-14 DIAGNOSIS — R600 Localized edema: Secondary | ICD-10-CM

## 2024-04-14 DIAGNOSIS — I340 Nonrheumatic mitral (valve) insufficiency: Secondary | ICD-10-CM

## 2024-04-14 NOTE — Patient Instructions (Signed)

## 2024-04-18 ENCOUNTER — Ambulatory Visit

## 2024-04-20 ENCOUNTER — Ambulatory Visit (INDEPENDENT_AMBULATORY_CARE_PROVIDER_SITE_OTHER)

## 2024-04-20 DIAGNOSIS — E538 Deficiency of other specified B group vitamins: Secondary | ICD-10-CM

## 2024-04-20 MED ORDER — CYANOCOBALAMIN 1000 MCG/ML IJ SOLN
1000.0000 ug | Freq: Once | INTRAMUSCULAR | Status: AC
Start: 2024-04-20 — End: 2024-04-20
  Administered 2024-04-20: 1000 ug via INTRAMUSCULAR

## 2024-04-20 NOTE — Progress Notes (Signed)
 Patient visits today for their b-12 injection. Patient informed of what they had received and tolerated injection well. Patient notified to reach out to office if needed.

## 2024-04-22 ENCOUNTER — Encounter: Payer: Self-pay | Admitting: Internal Medicine

## 2024-04-25 NOTE — Telephone Encounter (Signed)
 Patient dropped off document Handicap Placard, to be filled out by provider. Patient requested to send it back via Call Patient to pick up within 7-days. Document is located in providers tray at front office.Please advise at Mobile [phone] (mobile)

## 2024-04-27 ENCOUNTER — Encounter: Payer: Self-pay | Admitting: Internal Medicine

## 2024-05-11 ENCOUNTER — Encounter: Payer: Self-pay | Admitting: Internal Medicine

## 2024-05-16 ENCOUNTER — Other Ambulatory Visit: Payer: Self-pay | Admitting: Internal Medicine

## 2024-05-17 NOTE — Progress Notes (Signed)
 Subjective:    Patient ID: Grace Alvarado, female    DOB: 09-03-1925, 89 y.o.   MRN: [id number]      HPI Grace Alvarado is here for  Chief Complaint  Patient presents with   Leg Swelling  She is here with her daughter.  She has worsening of her bilateral leg edema-typically 1 leg was more swollen than the other, but now they are equivalent.  There is also been some weight gain.  She has not had any shortness of breath or chest pain.  Her legs do feel heavy at times and she does not like the way the swelling looks, but it does not bother her that much.  She does elevate her legs and is doing some walking in the hallway.  She did increase the torsemide  to 2 pills of 40 mg daily for 11 days the end of July-early August.  Since then she has been taking 20 mg daily.  There is no significant improvement in the leg edema or weight with the higher dose of torsemide .   Her weight at home today is 141.  The past 3 days her weight has been 139, 140, 142.    Medications and allergies reviewed with patient and updated if appropriate.  Current Outpatient Medications on File Prior to Visit  Medication Sig Dispense Refill   acetaminophen  (TYLENOL ) 325 MG tablet Take 1-2 tablets (325-650 mg total) by mouth every 6 (six) hours as needed for mild pain (pain score 1-3 or temp > 100.5).     diltiazem  (CARDIZEM  SR) 120 MG 12 hr capsule Take 1 capsule (120 mg total) by mouth 2 (two) times daily. 180 capsule 3   ferrous sulfate  325 (65 FE) MG EC tablet TAKE 1 TABLET (325 MG TOTAL) BY MOUTH EVERY OTHER DAY. FOLLOW UP WITH YOUR PCP REGARDING YOUR IRON  DEFICIENCY OUTPATIENT 45 tablet 1   levothyroxine  (SYNTHROID ) 75 MCG tablet TAKE 1 TABLET BY MOUTH EVERY DAY BEFORE BREAKFAST 90 tablet 1   pravastatin  (PRAVACHOL ) 10 MG tablet Take 1 tablet (10 mg total) by mouth daily. 30 tablet 1   torsemide  (DEMADEX ) 20 MG tablet TAKE 2 TABS (40 MG) BY MOUTH DAILY. 180 tablet 1   XARELTO  15 MG TABS tablet TAKE 1 TABLET BY  MOUTH EVERY DAY WITH SUPPER 90 tablet 1   KLOR-CON  M10 10 MEQ tablet TAKE 1 TABLET BY MOUTH EVERY DAY (Patient not taking: Reported on 05/18/2024) 90 tablet 3   No current facility-administered medications on file prior to visit.    Review of Systems  Constitutional:  Negative for fever.  Respiratory:  Negative for cough, shortness of breath and wheezing.   Cardiovascular:  Positive for leg swelling. Negative for chest pain and palpitations.  Neurological:  Negative for light-headedness and headaches.       Objective:   Vitals:   05/18/24 1351  BP: 118/78  Pulse: 70  Temp: 98 F (36.7 C)  SpO2: 97%   BP Readings from Last 3 Encounters:  05/18/24 118/78  04/14/24 120/64  02/12/24 116/68   Wt Readings from Last 3 Encounters:  05/18/24 143 lb (64.9 kg)  04/14/24 139 lb (63 kg)  03/15/24 136 lb (61.7 kg)   Body mass index is 25.33 kg/m.    Physical Exam Constitutional:      General: She is not in acute distress.    Appearance: Normal appearance.  HENT:     Head: Normocephalic and atraumatic.  Eyes:     Conjunctiva/sclera: Conjunctivae normal.  Cardiovascular:     Rate and Rhythm: Normal rate and regular rhythm.     Heart sounds: Murmur (3/6 systolic) heard.  Pulmonary:     Effort: Pulmonary effort is normal. No respiratory distress.     Breath sounds: Normal breath sounds. No wheezing.  Musculoskeletal:     Cervical back: Neck supple.     Right lower leg: Edema (1+ pitting edema three fourths the way up lower extremity-skin is taut) present.     Left lower leg: Edema (1+ pitting edema three fourths the way up lower extremity-skin is taut) present.  Lymphadenopathy:     Cervical: No cervical adenopathy.  Skin:    General: Skin is warm and dry.     Findings: No rash.  Neurological:     Mental Status: She is alert. Mental status is at baseline.  Psychiatric:        Mood and Affect: Mood normal.        Behavior: Behavior normal.            Assessment &  Plan:    See Problem List for Assessment and Plan of chronic medical problems.

## 2024-05-18 ENCOUNTER — Ambulatory Visit: Admitting: Internal Medicine

## 2024-05-18 ENCOUNTER — Encounter: Payer: Self-pay | Admitting: Internal Medicine

## 2024-05-18 ENCOUNTER — Ambulatory Visit: Payer: Self-pay | Admitting: Internal Medicine

## 2024-05-18 VITALS — BP 118/78 | HR 70 | Temp 98.0°F | Ht 63.0 in | Wt 143.0 lb

## 2024-05-18 DIAGNOSIS — E538 Deficiency of other specified B group vitamins: Secondary | ICD-10-CM | POA: Diagnosis not present

## 2024-05-18 DIAGNOSIS — I5032 Chronic diastolic (congestive) heart failure: Secondary | ICD-10-CM | POA: Diagnosis not present

## 2024-05-18 DIAGNOSIS — E038 Other specified hypothyroidism: Secondary | ICD-10-CM

## 2024-05-18 DIAGNOSIS — I340 Nonrheumatic mitral (valve) insufficiency: Secondary | ICD-10-CM

## 2024-05-18 DIAGNOSIS — R600 Localized edema: Secondary | ICD-10-CM

## 2024-05-18 DIAGNOSIS — I482 Chronic atrial fibrillation, unspecified: Secondary | ICD-10-CM

## 2024-05-18 LAB — CBC WITH DIFFERENTIAL/PLATELET
Basophils Absolute: 0 K/uL (ref 0.0–0.1)
Basophils Relative: 0.4 % (ref 0.0–3.0)
Eosinophils Absolute: 0.1 K/uL (ref 0.0–0.7)
Eosinophils Relative: 1.7 % (ref 0.0–5.0)
HCT: 36.2 % (ref 36.0–46.0)
Hemoglobin: 12.2 g/dL (ref 12.0–15.0)
Lymphocytes Relative: 30.7 % (ref 12.0–46.0)
Lymphs Abs: 1.4 K/uL (ref 0.7–4.0)
MCHC: 33.7 g/dL (ref 30.0–36.0)
MCV: 95.6 fl (ref 78.0–100.0)
Monocytes Absolute: 0.5 K/uL (ref 0.1–1.0)
Monocytes Relative: 11.8 % (ref 3.0–12.0)
Neutro Abs: 2.6 K/uL (ref 1.4–7.7)
Neutrophils Relative %: 55.4 % (ref 43.0–77.0)
Platelets: 133 K/uL — ABNORMAL LOW (ref 150.0–400.0)
RBC: 3.79 Mil/uL — ABNORMAL LOW (ref 3.87–5.11)
RDW: 15 % (ref 11.5–15.5)
WBC: 4.6 K/uL (ref 4.0–10.5)

## 2024-05-18 LAB — COMPREHENSIVE METABOLIC PANEL WITH GFR
ALT: 15 U/L (ref 0–35)
AST: 20 U/L (ref 0–37)
Albumin: 4.3 g/dL (ref 3.5–5.2)
Alkaline Phosphatase: 57 U/L (ref 39–117)
BUN: 12 mg/dL (ref 6–23)
CO2: 32 meq/L (ref 19–32)
Calcium: 9.7 mg/dL (ref 8.4–10.5)
Chloride: 98 meq/L (ref 96–112)
Creatinine, Ser: 0.81 mg/dL (ref 0.40–1.20)
GFR: 61.09 mL/min (ref >60.00)
Glucose, Bld: 94 mg/dL (ref 70–99)
Potassium: 4 meq/L (ref 3.5–5.1)
Sodium: 136 meq/L (ref 135–145)
Total Bilirubin: 0.8 mg/dL (ref 0.2–1.2)
Total Protein: 7.6 g/dL (ref 6.0–8.3)

## 2024-05-18 LAB — TSH: TSH: 2.49 u[IU]/mL (ref 0.35–5.50)

## 2024-05-18 MED ORDER — CYANOCOBALAMIN 1000 MCG/ML IJ SOLN
1000.0000 ug | Freq: Once | INTRAMUSCULAR | Status: AC
  Administered 2024-05-18 (×2): 1000 ug via INTRAMUSCULAR

## 2024-05-18 NOTE — Patient Instructions (Addendum)
      Blood work was ordered.       Medications changes include :   None

## 2024-05-18 NOTE — Assessment & Plan Note (Signed)
 Chronic Asymptomatic Heart rate controlled here today Continue Xarelto  15 mg daily, diltiazem  120 mg twice daily Has not had any falls CBC, CMP, TSH

## 2024-05-18 NOTE — Assessment & Plan Note (Signed)
 Chronic Continue monthly B12 injections Injection due today-given

## 2024-05-18 NOTE — Assessment & Plan Note (Signed)
 Chronic Swelling is slightly worse-both legs equally swollen Slight increase in weight, but no shortness of breath, chest pain or lightheadedness For 11 days she was on torsemide  40 mg daily without significant improvement in the leg swelling or weight Currently taking torsemide  20 mg daily Leg edema combination of chronic diastolic heart failure and venous insufficiency She is walking a little up and down the hallway and elevating her legs in the recliner which stressed doing consistently Given that there was no significant improvement in the leg edema with the higher dose of torsemide  and no significant change in weight in addition to lack of shortness of breath, chest pain or lightheadedness no further treatment is needed I think pushing the water pill too much may cause a disturbance in her fluid balance/cardiac balance you cause more problems than good Continue torsemide  20 mg daily Continue potassium chloride  10 mill equivalents daily

## 2024-05-18 NOTE — Assessment & Plan Note (Signed)
 Chronic  Clinically euthyroid Check TSH Currently taking levothyroxine  75 mcg daily

## 2024-05-18 NOTE — Assessment & Plan Note (Signed)
 Chronic Severe MR-does not want surgery No increasing shortness of breath Continue current medications

## 2024-05-18 NOTE — Assessment & Plan Note (Addendum)
 Chronic Has chronic leg edema, slight weight gain since she was here last, but weight has been stable the past few days Continue torsemide  20 mg daily. No shortness of breath which would be my most concerning symptom for her requiring higher dose of the water pill-I think the dangers of dehydrating her are depleting her intravascular volume could outweigh the benefits of improving her leg edema Will have to monitor closely to maintain a good balance CMP, CBC

## 2024-05-23 ENCOUNTER — Ambulatory Visit: Admitting: Podiatry

## 2024-05-23 ENCOUNTER — Encounter: Payer: Self-pay | Admitting: Podiatry

## 2024-05-23 DIAGNOSIS — I739 Peripheral vascular disease, unspecified: Secondary | ICD-10-CM

## 2024-05-23 DIAGNOSIS — M79675 Pain in left toe(s): Secondary | ICD-10-CM | POA: Diagnosis not present

## 2024-05-23 DIAGNOSIS — M79674 Pain in right toe(s): Secondary | ICD-10-CM | POA: Diagnosis not present

## 2024-05-23 DIAGNOSIS — L84 Corns and callosities: Secondary | ICD-10-CM | POA: Diagnosis not present

## 2024-05-23 DIAGNOSIS — Z7901 Long term (current) use of anticoagulants: Secondary | ICD-10-CM

## 2024-05-23 DIAGNOSIS — B351 Tinea unguium: Secondary | ICD-10-CM

## 2024-05-23 NOTE — Progress Notes (Signed)
       Subjective:  Patient ID: Grace Alvarado, female    DOB: Oct 13, 1924,  MRN: [id number]  Grace Alvarado presents to clinic today for:  Chief Complaint  Patient presents with   RFC    RFC and nail trim.  Calluses L medial great toe, R lateral side of foot. Non Diabetic. Xarelto    Patient notes nails are thick and elongated, causing pain in shoe gear when ambulating.  She has calluses present on the lateral aspect of the right fifth metatarsal base, the plantar aspect of the left heel, and left submet 1.  PCP is Geofm Glade PARAS, MD. last seen 05/18/2024  Past Medical History:  Diagnosis Date   Atrial fibrillation (HCC)    Hyperlipidemia    Hypertension    Hypothyroidism    Mitral regurgitation    MVP (mitral valve prolapse)    Allergies  Allergen Reactions   Codeine Other (See Comments)    unknown   Penicillins Other (See Comments)    unknown   Vancomycin Other (See Comments)    unknown   Zithromax [Azithromycin] Other (See Comments)    unknown   Latex Rash    Objective:  Grace Alvarado is a pleasant 89 y.o. female in NAD. AAO x 3.  Vascular Examination: Patient has palpable DP pulse, absent PT pulse bilateral.  Delayed capillary refill bilateral toes.  Sparse digital hair bilateral.  Proximal to distal cooling WNL bilateral.    Dermatological Examination: Interspaces are clear with no open lesions noted bilateral.  Skin is shiny and atrophic bilateral.  Nails are 3-58mm thick, with yellowish/brown discoloration, subungual debris and distal onycholysis x10.  There is pain with compression of nails x10.  There are hyperkeratotic lesions noted on the lateral aspect of the right fifth metatarsal base, the plantar aspect of the left heel, and left submet 1..  Patient qualifies for at-risk foot care because of PVD, long-term use of anticoagulants.  Assessment/Plan: 1. Pain due to onychomycosis of toenails of both feet   2. Callus of foot   3. PVD (peripheral vascular  disease) (HCC)   4. Long term current use of anticoagulant     Mycotic nails x10 were sharply debrided with sterile nail nippers and power debriding burr to decrease bulk and length.  Hyperkeratotic lesions x 3 were shaved with #312 blade at the locations listed above in the dermatological examination.  None were on the toes..  Return in about 3 months (around 08/23/2024) for RFC.   Awanda CHARM Imperial, DPM, FACFAS [facility]     [address] [place], [place] [address]                Office [phone]  Fax [phone]

## 2024-05-25 ENCOUNTER — Ambulatory Visit

## 2024-06-22 ENCOUNTER — Other Ambulatory Visit: Payer: Self-pay | Admitting: Cardiology

## 2024-06-22 DIAGNOSIS — Z7901 Long term (current) use of anticoagulants: Secondary | ICD-10-CM

## 2024-06-22 DIAGNOSIS — I482 Chronic atrial fibrillation, unspecified: Secondary | ICD-10-CM

## 2024-06-22 NOTE — Telephone Encounter (Signed)
 Prescription refill request for Xarelto  received.  Indication:afib Last office visit:7/25 Weight:64.9  kg Age:89 Scr:0.81  8/25 CrCl:41.62  ml/min  Prescription refilled

## 2024-06-22 NOTE — Telephone Encounter (Signed)
 Prescription refill request for Xarelto  received.  Indication: AF Last office visit: 04/14/24  KATHEE Shallow MD Weight: 63kg Age: 89 Scr: 0.81 on 05/18/24 CrCl: 40.40  Based on above findings Xarelto  15mg  daily is the appropriate dose.  Refill approved.

## 2024-06-27 ENCOUNTER — Ambulatory Visit

## 2024-07-07 ENCOUNTER — Ambulatory Visit (INDEPENDENT_AMBULATORY_CARE_PROVIDER_SITE_OTHER)

## 2024-07-07 DIAGNOSIS — E538 Deficiency of other specified B group vitamins: Secondary | ICD-10-CM

## 2024-07-07 MED ORDER — CYANOCOBALAMIN 1000 MCG/ML IJ SOLN
1000.0000 ug | Freq: Once | INTRAMUSCULAR | Status: AC
  Administered 2024-07-07: 1000 ug via INTRAMUSCULAR

## 2024-07-07 NOTE — Progress Notes (Signed)
Patient here for monthly B12 injection per Dr. Burns. B12 1000 mcg given in left IM and patient tolerated injection well today.  

## 2024-07-21 ENCOUNTER — Ambulatory Visit: Admitting: Orthopedic Surgery

## 2024-07-21 ENCOUNTER — Other Ambulatory Visit (INDEPENDENT_AMBULATORY_CARE_PROVIDER_SITE_OTHER): Payer: Self-pay

## 2024-07-21 ENCOUNTER — Encounter: Payer: Self-pay | Admitting: Orthopedic Surgery

## 2024-07-21 DIAGNOSIS — M25531 Pain in right wrist: Secondary | ICD-10-CM

## 2024-07-21 NOTE — Progress Notes (Signed)
 Office Visit Note   Patient: Grace Alvarado           Date of Birth: 14-Oct-1924           MRN: [id number] Visit Date: 07/21/2024 Requested by: Geofm Glade PARAS, MD [address] [place],  [place] [address] PCP: Geofm Glade PARAS, MD  Subjective: Chief Complaint  Patient presents with   Left Knee - Pain    HPI: Grace Alvarado is a 89 y.o. female who presents to the office reporting right wrist pain.  Been going on about 3 to 4 weeks.  She does use a brace.  She has been using the walker going to the park for walks on uneven terrain for about 15 minutes.  She is right-hand dominant.  The pain is not constant but it does vary.  She does take Tylenol  arthritis which helps.  Denies any discrete injury to the right wrist..                ROS: All systems reviewed are negative as they relate to the chief complaint within the history of present illness.  Patient denies fevers or chills.  Assessment & Plan: Visit Diagnoses: No diagnosis found.  Plan: Impression is right wrist tendinitis versus TFCC degeneration.  Bones are osteopenic.  No fracture on radiographs.  Plan is topical Voltaren  twice a day for a limited time of 10 days.  Applying it to a 4 postage stamp size area on her wrist should not create any absorption which could affect her Xarelto  in terms of blood thinning.  Injection would be the next step if the symptoms do not improve.  Follow-up with us  as needed.  Follow-Up Instructions: No follow-ups on file.   Orders:  No orders of the defined types were placed in this encounter.  No orders of the defined types were placed in this encounter.     Procedures: No procedures performed   Clinical Data: No additional findings.  Objective: Vital Signs: LMP  (LMP Unknown)   Physical Exam:  Constitutional: Patient appears well-developed HEENT:  Head: Normocephalic Eyes:EOM are normal Neck: Normal range of motion Cardiovascular: Normal rate Pulmonary/chest: Effort  normal Neurologic: Patient is alert Skin: Skin is warm Psychiatric: Patient has normal mood and affect  Ortho Exam: Ortho exam demonstrates symmetric grip strength bilaterally.  Radial pulses intact.  Mild bruising is present on both arms.  EPL FPL interosseous strength is intact.  Patient has symmetric flexion and extension of the wrist with only slight pain with radial and ulnar deviation on the right not present on the left.  Most of her tenderness localizes to the TFCC region.  No subluxation of the ECU tendon with pronation or supination.  Sensation intact on the palmar and dorsal aspect of the right hand.  Specialty Comments:  No specialty comments available.  Imaging: No results found.   PMFS History: Patient Active Problem List   Diagnosis Date Noted   Acute pain of left knee 09/17/2023   Burning sensation of mouth 09/17/2023   Eczema 08/17/2023   Melena 04/12/2023   Chronic iron  deficiency anemia 04/12/2023   Benign mole 12/23/2022   Dry eyes, bilateral 07/07/2022   Bilateral leg edema 12/17/2021   Memory changes 10/29/2021   COVID 10/28/2021   Left-sided nosebleed 10/14/2021   Fatigue 08/14/2021   Urinary frequency 08/14/2021   Lumbar radiculopathy 06/25/2021   Painful rib 09/27/2020   LUQ pain 09/17/2020   Pilonidal cyst 09/17/2020   Allergic rhinitis  07/03/2020   Aortic atherosclerosis 06/13/2020   DNR (do not resuscitate) 03/07/2020   Prepatellar bursitis, left knee 03/02/2020   Neuropathy 01/24/2020   Chronic back pain 01/24/2020   Compression fracture of T11 vertebra (HCC) 01/23/2020   Leg pain 01/03/2020   Chronic anticoagulation 01/03/2020   Compression fracture of L1 lumbar vertebra (HCC) 07/27/2019   Low back pain 07/20/2019   Hyperglycemia 05/01/2019   Constipation 11/16/2018   AVM (arteriovenous malformation) of colon    Lower GI bleed 11/06/2018   Rectal bleeding 11/05/2018   Iron  deficiency anemia 11/02/2018   B12 deficiency 05/27/2018   GERD  (gastroesophageal reflux disease) 05/20/2018   Pulmonary hypertension, primary (HCC) 05/20/2018   Dyslipidemia 05/20/2018   Chronic diastolic heart failure (HCC) 05/20/2018   Tricuspid regurgitation 05/17/2018   Mitral regurgitation, severe 2023 05/17/2018   Chronic a-fib 05/17/2018   Essential hypertension 05/17/2018   Hypothyroid 05/17/2018   CAD (coronary artery disease) 05/17/2018   Carotid atherosclerosis, bilateral 05/17/2018   Past Medical History:  Diagnosis Date   Atrial fibrillation (HCC)    Hyperlipidemia    Hypertension    Hypothyroidism    Mitral regurgitation    MVP (mitral valve prolapse)     Family History  Problem Relation Age of Onset   Cancer Mother    Dementia Mother     Past Surgical History:  Procedure Laterality Date   APPENDECTOMY     c section     CESAREAN SECTION     CHOLECYSTECTOMY     COLONOSCOPY WITH PROPOFOL  N/A 11/09/2018   Procedure: COLONOSCOPY WITH PROPOFOL ;  Surgeon: Legrand Victory LITTIE DOUGLAS, MD;  Location: [facility] ENDOSCOPY;  Service: Gastroenterology;  Laterality: N/A;   ESOPHAGOGASTRODUODENOSCOPY N/A 04/12/2023   Procedure: ESOPHAGOGASTRODUODENOSCOPY (EGD);  Surgeon: Legrand Victory LITTIE DOUGLAS, MD;  Location: [facility]OSCOPY;  Service: Gastroenterology;  Laterality: N/A;   ESOPHAGOGASTRODUODENOSCOPY (EGD) WITH PROPOFOL  N/A 11/09/2018   Procedure: ESOPHAGOGASTRODUODENOSCOPY (EGD) WITH PROPOFOL ;  Surgeon: Legrand Victory LITTIE DOUGLAS, MD;  Location: [facility] ENDOSCOPY;  Service: Gastroenterology;  Laterality: N/A;   HOT HEMOSTASIS N/A 11/09/2018   Procedure: HOT HEMOSTASIS (ARGON PLASMA COAGULATION/BICAP);  Surgeon: Legrand Victory LITTIE DOUGLAS, MD;  Location: [facility] ENDOSCOPY;  Service: Gastroenterology;  Laterality: N/A;   I & D EXTREMITY Left 03/02/2020   Procedure: evacuation of left knee hematoma, possible wound vac placement;  Surgeon: Addie Cordella Hamilton, MD;  Location: [facility] OR;  Service: Orthopedics;  Laterality: Left;   SUBMUCOSAL INJECTION  11/09/2018   Procedure: SUBMUCOSAL INJECTION;   Surgeon: Legrand Victory LITTIE DOUGLAS, MD;  Location: [facility] ENDOSCOPY;  Service: Gastroenterology;;   Social History   Occupational History   Occupation: retired  Tobacco Use   Smoking status: Former   Smokeless tobacco: Never  Vaping Use   Vaping status: Never Used  Substance and Sexual Activity   Alcohol use: Not Currently   Drug use: Never   Sexual activity: Not Currently

## 2024-07-31 ENCOUNTER — Encounter: Payer: Self-pay | Admitting: Orthopedic Surgery

## 2024-08-01 NOTE — Telephone Encounter (Signed)
 I would go with Voltaren  every other day for 2 weeks and then stop taking it and then use the wrist splint for support would be good.  Thanks

## 2024-08-08 ENCOUNTER — Ambulatory Visit (INDEPENDENT_AMBULATORY_CARE_PROVIDER_SITE_OTHER)

## 2024-08-08 ENCOUNTER — Encounter: Payer: Self-pay | Admitting: Radiology

## 2024-08-08 DIAGNOSIS — E538 Deficiency of other specified B group vitamins: Secondary | ICD-10-CM

## 2024-08-08 MED ORDER — CYANOCOBALAMIN 1000 MCG/ML IJ SOLN
1000.0000 ug | Freq: Once | INTRAMUSCULAR | Status: AC
  Administered 2024-08-08: 1000 ug via INTRAMUSCULAR

## 2024-08-08 NOTE — Progress Notes (Signed)
 After obtaining consent, and per orders of Dr. Lawerance Bach, injection of B12 given by Ferdie Ping. Patient instructed to report any adverse reaction to me immediately.

## 2024-08-22 ENCOUNTER — Encounter: Payer: Self-pay | Admitting: Podiatry

## 2024-08-22 ENCOUNTER — Ambulatory Visit: Admitting: Podiatry

## 2024-08-22 DIAGNOSIS — L84 Corns and callosities: Secondary | ICD-10-CM

## 2024-08-22 DIAGNOSIS — Z7901 Long term (current) use of anticoagulants: Secondary | ICD-10-CM

## 2024-08-22 DIAGNOSIS — M79675 Pain in left toe(s): Secondary | ICD-10-CM

## 2024-08-22 DIAGNOSIS — M79674 Pain in right toe(s): Secondary | ICD-10-CM | POA: Diagnosis not present

## 2024-08-22 DIAGNOSIS — B351 Tinea unguium: Secondary | ICD-10-CM

## 2024-08-22 DIAGNOSIS — I739 Peripheral vascular disease, unspecified: Secondary | ICD-10-CM | POA: Diagnosis not present

## 2024-08-22 NOTE — Progress Notes (Signed)
       Subjective:  Patient ID: Grace Alvarado, female    DOB: [birth year],  MRN: [id number]  Grace Alvarado presents to clinic today for:  Chief Complaint  Patient presents with   [facility]     [facility] Non diabetic toenail trim and callus care.   Patient notes nails are thick and elongated, causing pain in shoe gear when ambulating.  She has painful calluses on the plantar aspect of the left foot only.  PCP is Geofm Glade PARAS, MD. last seen 05/18/2024  Past Medical History:  Diagnosis Date   Atrial fibrillation (HCC)    Hyperlipidemia    Hypertension    Hypothyroidism    Mitral regurgitation    MVP (mitral valve prolapse)    Allergies  Allergen Reactions   Codeine Other (See Comments)    unknown   Penicillins Other (See Comments)    unknown   Vancomycin Other (See Comments)    unknown   Zithromax [Azithromycin] Other (See Comments)    unknown   Latex Rash    Objective:  Grace Alvarado is a pleasant 89 y.o. female in NAD. AAO x 3.  Vascular Examination: Patient has palpable DP pulse, absent PT pulse bilateral.  Delayed capillary refill bilateral toes.  Sparse digital hair bilateral.  Proximal to distal cooling WNL bilateral.    Dermatological Examination: Interspaces are clear with no open lesions noted bilateral.  Skin is shiny and atrophic bilateral.  Nails are 3-68mm thick, with yellowish/brown discoloration, subungual debris and distal onycholysis x10.  There is pain with compression of nails x10.  There are hyperkeratotic lesions noted left submet 5 and plantar left heel..  Patient qualifies for at-risk foot care because of PVD, long-term use of anticoagulants.  Assessment/Plan: 1. Pain due to onychomycosis of toenails of both feet   2. Callus of foot   3. PVD (peripheral vascular disease)   4. Long term current use of anticoagulant     Mycotic nails x10 were sharply debrided with sterile nail nippers and power debriding burr to decrease bulk and length.  Hyperkeratotic  lesions x 2 were shaved with #312 blade.  Both lesions are proximal to the toes.  Return in about 3 months (around 11/22/2024) for RFC.   Awanda CHARM Imperial, DPM, FACFAS [facility]     [address] [place], [place] [address]                Office [phone]  Fax [phone]

## 2024-09-06 ENCOUNTER — Encounter: Payer: Self-pay | Admitting: Internal Medicine

## 2024-09-06 ENCOUNTER — Other Ambulatory Visit: Payer: Self-pay

## 2024-09-06 MED ORDER — KLOR-CON M10 10 MEQ PO TBCR: 10.0000 meq | EXTENDED_RELEASE_TABLET | Freq: Every day | ORAL | 0 refills | Status: AC

## 2024-09-07 ENCOUNTER — Ambulatory Visit

## 2024-09-07 ENCOUNTER — Other Ambulatory Visit: Payer: Self-pay

## 2024-09-07 MED ORDER — POTASSIUM CHLORIDE CRYS ER 10 MEQ PO TBCR: 10.0000 meq | EXTENDED_RELEASE_TABLET | Freq: Two times a day (BID) | ORAL | 2 refills | Status: AC

## 2024-09-21 ENCOUNTER — Encounter: Payer: Self-pay | Admitting: Internal Medicine

## 2024-10-07 ENCOUNTER — Encounter: Payer: Self-pay | Admitting: Internal Medicine

## 2024-10-07 ENCOUNTER — Other Ambulatory Visit: Payer: Self-pay | Admitting: Cardiology

## 2024-10-11 ENCOUNTER — Other Ambulatory Visit: Payer: Self-pay | Admitting: Internal Medicine

## 2024-10-11 ENCOUNTER — Other Ambulatory Visit: Payer: Self-pay | Admitting: Cardiology

## 2024-10-12 ENCOUNTER — Encounter: Payer: Self-pay | Admitting: Cardiology

## 2024-10-30 ENCOUNTER — Other Ambulatory Visit: Payer: Self-pay | Admitting: Internal Medicine

## 2024-11-24 ENCOUNTER — Ambulatory Visit: Admitting: Podiatry

## 2025-02-14 ENCOUNTER — Encounter: Admitting: Internal Medicine

## 2025-03-17 ENCOUNTER — Ambulatory Visit
